# Patient Record
Sex: Female | Born: 1940 | Race: White | Hispanic: No | Marital: Married | Smoking: Former smoker
Health system: Southern US, Community
[De-identification: ages and names within clinical notes are randomized; demographics above are authoritative.]

## PROBLEM LIST (undated history)

## (undated) DIAGNOSIS — D649 Anemia, unspecified: Secondary | ICD-10-CM

## (undated) DIAGNOSIS — K5792 Diverticulitis of intestine, part unspecified, without perforation or abscess without bleeding: Secondary | ICD-10-CM

## (undated) DIAGNOSIS — IMO0001 Reserved for inherently not codable concepts without codable children: Secondary | ICD-10-CM

## (undated) DIAGNOSIS — I6529 Occlusion and stenosis of unspecified carotid artery: Secondary | ICD-10-CM

## (undated) DIAGNOSIS — M1990 Unspecified osteoarthritis, unspecified site: Secondary | ICD-10-CM

## (undated) DIAGNOSIS — I10 Essential (primary) hypertension: Secondary | ICD-10-CM

## (undated) DIAGNOSIS — C189 Malignant neoplasm of colon, unspecified: Secondary | ICD-10-CM

## (undated) DIAGNOSIS — C44721 Squamous cell carcinoma of skin of unspecified lower limb, including hip: Secondary | ICD-10-CM

## (undated) DIAGNOSIS — Z9289 Personal history of other medical treatment: Secondary | ICD-10-CM

## (undated) DIAGNOSIS — K529 Noninfective gastroenteritis and colitis, unspecified: Secondary | ICD-10-CM

## (undated) DIAGNOSIS — J449 Chronic obstructive pulmonary disease, unspecified: Secondary | ICD-10-CM

## (undated) DIAGNOSIS — L930 Discoid lupus erythematosus: Secondary | ICD-10-CM

## (undated) DIAGNOSIS — L4050 Arthropathic psoriasis, unspecified: Secondary | ICD-10-CM

## (undated) HISTORY — DX: Essential (primary) hypertension: I10

## (undated) HISTORY — PX: DILATION AND CURETTAGE OF UTERUS: SHX78

## (undated) HISTORY — DX: Chronic obstructive pulmonary disease, unspecified: J44.9

## (undated) HISTORY — DX: Diverticulitis of intestine, part unspecified, without perforation or abscess without bleeding: K57.92

## (undated) HISTORY — PX: COLONOSCOPY W/ POLYPECTOMY: SHX1380

## (undated) HISTORY — PX: JOINT REPLACEMENT: SHX530

## (undated) HISTORY — PX: SQUAMOUS CELL CARCINOMA EXCISION: SHX2433

## (undated) HISTORY — PX: MOHS SURGERY: SUR867

## (undated) HISTORY — DX: Discoid lupus erythematosus: L93.0

## (undated) HISTORY — DX: Anemia, unspecified: D64.9

---

## 1966-10-21 HISTORY — PX: VAGINAL HYSTERECTOMY: SUR661

## 1999-06-23 ENCOUNTER — Encounter: Payer: Self-pay | Admitting: Family Medicine

## 1999-06-23 ENCOUNTER — Ambulatory Visit (HOSPITAL_COMMUNITY): Admission: RE | Admit: 1999-06-23 | Discharge: 1999-06-23 | Payer: Self-pay | Admitting: Family Medicine

## 2004-10-21 HISTORY — PX: EXCISIONAL HEMORRHOIDECTOMY: SHX1541

## 2004-10-21 HISTORY — PX: CYSTOCELE REPAIR: SHX163

## 2004-10-21 HISTORY — PX: RECTOCELE REPAIR: SHX761

## 2006-08-12 ENCOUNTER — Ambulatory Visit: Payer: Self-pay | Admitting: Internal Medicine

## 2006-08-26 ENCOUNTER — Ambulatory Visit: Payer: Self-pay | Admitting: Internal Medicine

## 2008-09-07 ENCOUNTER — Encounter: Admission: RE | Admit: 2008-09-07 | Discharge: 2008-09-07 | Payer: Self-pay | Admitting: Interventional Cardiology

## 2008-09-12 ENCOUNTER — Inpatient Hospital Stay (HOSPITAL_BASED_OUTPATIENT_CLINIC_OR_DEPARTMENT_OTHER): Admission: RE | Admit: 2008-09-12 | Discharge: 2008-09-12 | Payer: Self-pay | Admitting: Interventional Cardiology

## 2008-09-12 ENCOUNTER — Ambulatory Visit (HOSPITAL_COMMUNITY): Admission: RE | Admit: 2008-09-12 | Discharge: 2008-09-12 | Payer: Self-pay | Admitting: Interventional Cardiology

## 2008-10-07 ENCOUNTER — Ambulatory Visit (HOSPITAL_COMMUNITY): Admission: RE | Admit: 2008-10-07 | Discharge: 2008-10-07 | Payer: Self-pay | Admitting: Internal Medicine

## 2008-10-07 ENCOUNTER — Encounter: Payer: Self-pay | Admitting: Emergency Medicine

## 2008-12-08 ENCOUNTER — Ambulatory Visit: Payer: Self-pay | Admitting: Emergency Medicine

## 2008-12-08 DIAGNOSIS — J449 Chronic obstructive pulmonary disease, unspecified: Secondary | ICD-10-CM | POA: Insufficient documentation

## 2008-12-08 DIAGNOSIS — I10 Essential (primary) hypertension: Secondary | ICD-10-CM | POA: Insufficient documentation

## 2009-03-28 ENCOUNTER — Ambulatory Visit: Payer: Self-pay | Admitting: *Deleted

## 2009-04-13 ENCOUNTER — Ambulatory Visit: Payer: Self-pay | Admitting: *Deleted

## 2009-06-19 ENCOUNTER — Ambulatory Visit: Payer: Self-pay | Admitting: Emergency Medicine

## 2009-11-21 ENCOUNTER — Ambulatory Visit: Payer: Self-pay | Admitting: Emergency Medicine

## 2009-11-21 DIAGNOSIS — L930 Discoid lupus erythematosus: Secondary | ICD-10-CM

## 2010-01-22 ENCOUNTER — Telehealth (INDEPENDENT_AMBULATORY_CARE_PROVIDER_SITE_OTHER): Payer: Self-pay | Admitting: *Deleted

## 2010-01-24 ENCOUNTER — Telehealth: Payer: Self-pay | Admitting: Emergency Medicine

## 2010-11-22 NOTE — Progress Notes (Signed)
Summary: wants meds called in  Phone Note Call from Patient Call back at Home Phone [phone]   Caller: Patient Call For: byrum Summary of Call: patient has cough, drainage, sore throat, and loosing voice. wants dr byrum to call something in for her. she thinks it is her allergies Initial call taken by: Valinda Hoar,  January 22, 2010 12:53 PM  Follow-up for Phone Call        called and spoke with pt.  pt states she recently saw RB 11-21-2009 and was told to f/u in 1 yr.   Pt calls today c/o coughing up yellow to green colored sputum, nasal congestion with yellow to green colored nasal drainage, head ache, sinus pressure, losing voice.  Pt denied sore throat or fever.  Symptoms started yesterday.  Pt has taken Tylenol and Benadryl with no relief of symptoms.  Please advise.  RB not in office today.  Will forward message to doc of the day to address.  Thank you.  Aundra Millet Reynolds LPN  January 23, 980 1:54 PM   Additional Follow-up for Phone Call Additional follow up Details #1::        call in augmentin 875  one by mouth two times a day x 7days nasonex two sprays each nostril daily #1 Needs to see Byrum soon Additional Follow-up by: Storm Frisk MD,  January 22, 2010 2:34 PM    Additional Follow-up for Phone Call Additional follow up Details #2::    called and spoke with pt.  pt aware rx sent to pharmacy and to f/u with RB.  Aundra Millet Reynolds LPN  January 22, 1913 3:03 PM   New/Updated Medications: AUGMENTIN 875-125 MG TABS (AMOXICILLIN-POT CLAVULANATE) Take 1 tablet by mouth two times a day for 7 days NASONEX 50 MCG/ACT  SUSP (MOMETASONE FUROATE) Two puffs each nostril daily Prescriptions: NASONEX 50 MCG/ACT  SUSP (MOMETASONE FUROATE) Two puffs each nostril daily  #1 x 0   Entered by:   Arman Filter LPN   Authorized by:   Storm Frisk MD   Signed by:   Arman Filter LPN on 78/29/5621   Method used:   Electronically to        [facility] [phone]* (retail)  [address]       [place], [place]  [address]       Ph: [phone] or [phone]       Fax: [phone]   RxID:   [id number] AUGMENTIN 875-125 MG TABS (AMOXICILLIN-POT CLAVULANATE) Take 1 tablet by mouth two times a day for 7 days  #14 x 0   Entered by:   Arman Filter LPN   Authorized by:   Storm Frisk MD   Signed by:   Arman Filter LPN on 88/41/6606   Method used:   Electronically to        [facility] [phone]* (retail)       [address]       [place], [place]  [address]       Ph: [phone] or [phone]       Fax: [phone]   RxID:   [id number]

## 2010-11-22 NOTE — Progress Notes (Signed)
Summary: cough/ diarrhea  Phone Note Call from Patient   Caller: Patient Call For: byrum Summary of Call: pt still coughing and have diarrhea . taking nasonex and antibiotic [facility] Initial call taken by: Rickard Patience,  January 24, 2010 9:11 AM  Follow-up for Phone Call        Pt states harseness is better, but she does have a dry cough. I advised the medication will take time to completely get her better. She then states she has had diarrhea since she started the amoxicillin. She states seh is eating before taking the medication. Please advise. Carron Curie CMA  January 24, 2010 10:20 AM  Please ask her to stop the Augmentin. She may not need any further abx. If her symptoms worsen than we can call her in an alternative - have her call us on Friday to update [place] on her status. Leslye Peer MD  January 24, 2010 10:51 AM   Follow-up by: Leslye Peer MD,  January 24, 2010 10:51 AM  Additional Follow-up for Phone Call Additional follow up Details #1::        pt advised. Carron Curie CMA  January 24, 2010 10:55 AM

## 2010-11-22 NOTE — Assessment & Plan Note (Signed)
Summary: COPD   Visit Type:  Follow-up Copy to:  Dr. Timothy Lasso Primary Provider/Referring Provider:  Dr Timothy Lasso  CC:  COPD.  The patient has no complaints today.Marland Kitchen  History of Present Illness: 70 yo woman with HTN, former tobacco. Dx with severe AFL and severe COPD.   ROV 11/21/09 -- She is taking Advair qam instead of two times a day. Continues to be active, walks on treadmill once daily. Able to sing in the choir. No exacerbations since last visit, does not wheeze. Seems to be tolerating the Advair without much cough. 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 Seems to be tolerating.         Current Medications (verified): 1)  Abc Plus Senior  Tabs (Multiple Vitamins-Minerals) .... Once Daily 2)  Vitamins C E 500-400 Mg-Unit Caps (Vitamins C E) .... Once Daily 3)  Adult Aspirin Ec Low Strength 81 Mg Tbec (Aspirin) .... Once Daily 4)  Oscal 500/200 D-3 500-200 Mg-Unit Tabs (Calcium-Vitamin D) .... Once Daily 5)  Diovan 80 Mg Tabs (Valsartan) .... Once Daily 6)  Premarin 1.25 Mg Tabs (Estrogens Conjugated) .... Once Daily 7)  Caltrate 600 1500 Mg Tabs (Calcium Carbonate) .... Once Daily 8)  B-12 Dots 500 Mcg Subl (Cyanocobalamin) .... Once Daily 9)  Cvs Lecithin 1200 Mg Caps (Lecithin) .... Three Times A Day 10)  Advair Diskus 250-50 Mcg/dose Misc (Fluticasone-Salmeterol) .Marland Kitchen.. 1 Puff Once Daily 11)  Plaquenil 200 Mg Tabs (Hydroxychloroquine Sulfate) .Marland Kitchen.. 1 By Mouth Daily  Allergies (verified): 1)  ! * Invair 2)  ! 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) HYPERTENSION (ICD-401.9) C O P D (ICD-496)  Vital Signs:  Patient profile:   70 year old female Height:      65 inches (165.10 cm) Weight:      167.50 pounds (76.14 kg) BMI:     27.97 O2 Sat:      91 % on Room air Temp:     97.7 degrees F (36.50 degrees C) oral Pulse rate:   87 / minute BP sitting:   118 / 80  (left  arm) Cuff size:   regular  Vitals Entered By: Michel Bickers CMA (November 21, 2009 2:32 PM)  O2 Sat at Rest %:  91 O2 Flow:  Room air  Physical Exam  General:  well developed, well nourished, in no acute distress Head:  normocephalic and atraumatic Eyes:  conjunctiva and sclera clear Nose:  no deformity, discharge, inflammation, or lesions Mouth:  no deformity or lesions Neck:  no masses, thyromegaly, or abnormal cervical nodes Chest Wall:  no deformities noted Lungs:  clear bilaterally to auscultation and percussion Heart:  regular rate and rhythm, S1, S2 without murmurs, rubs, gallops, or clicks Abdomen:  not examined Msk:  no deformity or scoliosis noted with normal posture Extremities:  no clubbing, cyanosis, edema, or deformity noted Neurologic:  non-focal Skin:  intact without lesions or rashes Psych:  alert and cooperative; normal mood and affect; normal attention span and concentration   Impression & Recommendations:  Problem # 1:  C O P D (ICD-496) Advair once daily  ProAir as needed  pneumovax x 1 more time flu shot every year rov 1 yr or as needed   Medications Added to Medication List This Visit: 1)  Advair Diskus 250-50 Mcg/dose Misc (Fluticasone-salmeterol) .Marland Kitchen.. 1 puff once daily 2)  Plaquenil 200 Mg Tabs (Hydroxychloroquine sulfate) .Marland Kitchen.. 1 by  mouth daily 3)  Proair Hfa 108 (90 Base) Mcg/act Aers (Albuterol sulfate) .... 2 puffs q4h as needed sob  Other Orders: Est. Patient Level III (70623)  Patient Instructions: 1)  Continue your Advair every morning 2)  Use ProAir as needed  3)  Get your pneumona vaccine as planned with Dr Timothy Lasso. You need it one more time before age 29.  4)  Follow up with Dr Delton Coombes in 1 year or as needed    Immunization History:  Influenza Immunization History:    Influenza:  historical (07/05/2009)  Pneumovax Immunization History:    Pneumovax:  historical (04/19/2005)

## 2010-12-06 ENCOUNTER — Encounter: Payer: Self-pay | Admitting: Emergency Medicine

## 2010-12-06 ENCOUNTER — Encounter (INDEPENDENT_AMBULATORY_CARE_PROVIDER_SITE_OTHER): Payer: Medicare Other | Admitting: Emergency Medicine

## 2010-12-06 DIAGNOSIS — J449 Chronic obstructive pulmonary disease, unspecified: Secondary | ICD-10-CM

## 2010-12-12 NOTE — Assessment & Plan Note (Signed)
Summary: COPD   Visit Type:  Follow-up Copy to:  Dr. Timothy Lasso Primary Provider/Referring Provider:  Dr Timothy Lasso  CC:  1 year follow up. Marland Kitchen  History of Present Illness: 70 yo woman with HTN, former tobacco. Dx with severe AFL and severe COPD.   ROV 11/21/09 -- She is taking Advair qam instead of two times a day. Continues to be active, walks on treadmill once daily. Able to sing in the choir. No exacerbations since last visit, does not wheeze. Seems to be tolerating the Advair without much cough. 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 Seems to be tolerating.   ROV 12/06/10 -- f/u for severe COPD, also w hx of discoid SLE. annual f/u. Was treated for URI and exacerbation with steroids. No other flares in last year. Still able to exert, able to sing in the choir. Still has a bit of a raspy voice. Using Advair in the am only (keeps her from sleeping at night). Never uses her ProAir.         Allergies: 1)  ! * Invair 2)  ! Compazine  Vital Signs:  Patient profile:   70 year old female Height:      65 inches Weight:      170.25 pounds BMI:     28.43 O2 Sat:      98 % on Room air Temp:     97.9 degrees F oral Pulse rate:   71 / minute BP supine:   130 / 68 Cuff size:   regular  Vitals Entered By: Carver Fila (December 06, 2010 10:06 AM)  O2 Flow:  Room air CC: 1 year follow up.  Comments meds and allergies updated Phone number updated  Mindy Edward Jolly  December 06, 2010 10:07 AM    Physical Exam  General:  well developed, well nourished, in no acute distress Head:  normocephalic and atraumatic Eyes:  conjunctiva and sclera clear Nose:  no deformity, discharge, inflammation, or lesions Mouth:  no deformity or lesions Neck:  no masses, thyromegaly, or abnormal cervical nodes Chest Wall:  no deformities noted Lungs:  clear bilaterally to auscultation and percussion Heart:  regular rate and rhythm, S1, S2 without murmurs, rubs, gallops, or  clicks Abdomen:  not examined Msk:  no deformity or scoliosis noted with normal posture Extremities:  no clubbing, cyanosis, edema, or deformity noted Neurologic:  non-focal Skin:  intact without lesions or rashes Psych:  alert and cooperative; normal mood and affect; normal attention span and concentration   Impression & Recommendations:  Problem # 1:  C O P D (ICD-496) Stable  Other Orders: Est. Patient Level III (16109)  Patient Instructions: 1)  Continue your Advair as you are taking it 2)  Have ProAir available to use as needed  3)  Follow with Dr Delton Coombes in 1 year or as needed  4)  Get a flu shot every fall. 5)  You will not need another Pneumovax.    Influenza Immunization History:    Influenza # 1:  Historical (12020-04-2610)  Pneumovax Immunization History:    Pneumovax # 1:  Historical (12020-04-2610)

## 2011-01-09 NOTE — Progress Notes (Signed)
This encounter was created in error - please disregard.

## 2011-03-05 NOTE — Cardiovascular Report (Signed)
NAME:  Casey Wilkinson, Casey Wilkinson NO.:  [id number]   MEDICAL RECORD NO.:  [id number]          PATIENT TYPE:  OIB   LOCATION:  1963                         FACILITY:  [facility]   PHYSICIAN:  Lyn Records, M.D.   DATE OF BIRTH:  03-03-1941   DATE OF PROCEDURE:  09/12/2008  DATE OF DISCHARGE:  09/12/2008                            CARDIAC CATHETERIZATION   INDICATION:  [name] is 45, and was evaluated with a stress nuclear  study ordered by Dr. Lupe Carney on September 07, 2008.  The stress  test was limited by the development of shortness of breath and chest  tightness.  This symptom had been ongoing for about a month prior to the  study.  She developed ST-segment depression with inferolateral  persistent depression and T-wave inversion post exercise.  She required  sublingual nitroglycerin after the procedure for resolution of chest  discomfort.  Surprisingly, the nuclear images were normal.  Since  discharge from the office on the day of the study, she has had one  episode of chest tightness that was relieved by sublingual nitro.   PROCEDURES PERFORMED:  1. Left heart cath.  2. Selective coronary angiography.  3. Left ventriculography.   DESCRIPTION:  After informed consent and following 1 mg of IV Versed and  25 mcg of fentanyl, a 4-French sheath was placed in the right femoral  artery using the modified Seldinger technique.  A 4-French A2  multipurpose catheter was used for hemodynamic recordings, left  ventriculography by hand injection, and selective right coronary  angiography.  A 4-French #4 left Judkins catheter was used for left  coronary angiography.  The patient tolerated the procedure without  complications.   RESULTS:  1. Hemodynamic data:      a.     Aortic pressure 129/16.      b.     Left ventricular pressure 133/17.  2. Left ventriculography:  The left ventricular cavity size and      function are normal.  3. Coronary angiography:      a.     Left  main coronary:  Widely patent.      b.     Left anterior descending coronary:  The vessel is large.  It       is transapical.  It gives origin to a probable intramyocardial LAD       segment that supplies all of the septal perforators.  The LAD       reaches the LV apex.  No significant obstruction is noted.  A       large first diagonal wraps around the anterolateral wall and       supplies a region near the apex.  No obstruction is noted.  The       proximal LAD contains mild calcification.      c.     Circumflex artery:  The circumflex coronary artery is large       and gives origin to 3 obtuse marginal branches.  The second obtuse       marginal is small.  The third obtuse marginal is moderate  in size       and supplies in inferolateral wall.  The first obtuse marginal is       a large branching vessel that has near the distribution of a ramus       intermedius branch.      d.     Right coronary:  The right coronary artery is calcified.  It       contains a shepherd's crook.  Irregularities are noted in the       proximal vessel and in the shepherd's crook.  The vessel is widely       patent without any evidence of obstruction.  A large PDA arises       distally.   CONCLUSIONS:  1. Mild calcification in the proximal right coronary artery and      proximal left anterior descending.  2. Widely patent coronaries without visible significant obstruction or      plaque.  3. Normal left ventricular function.   PLAN:  The patient's exertional dyspnea and EKG abnormalities are still  concerning.  Pulmonary embolism needs to be excluded.      Lyn Records, M.D.  Electronically Signed     HWS/MEDQ  D:  09/12/2008  T:  09/12/2008  Job:  [id number]   cc:   L. Lupe Carney, M.D.

## 2011-03-05 NOTE — Consult Note (Signed)
VASCULAR SURGERY CONSULTATION   VERNIA, TEEM  DOB:  [birth year]                                       04/13/2009  ZOXWR#:[id number]   REFERRING PHYSICIAN:  Richard C. Tuchman, D.P.M.   PRIMARY CARE PHYSICIAN:  Gwen Pounds, MD.   REFERRAL DIAGNOSIS:  Peripheral vascular disease.   HISTORY:  The patient is a 70 year old female generally well referred  for evaluation of peripheral vascular disease.  She was seen by Dr.  Leeanne Deed with a complaint of an ingrown toenail.  His evaluation,  however, revealed a reduction in lower extremity pulses and she was  referred for lower extremity Dopplers.   Lower extremity Dopplers do reveal mild reduction in ankle brachial  indices at 0.89 on the right and 0.95 on the left.  Digital indices were  0.48 on the right and 0.55 on the left.  Biphasic tibial waveforms are  present bilaterally.   The patient has minimal risk factors for peripheral vascular disease,  she is not a diabetic.  She has used tobacco in the past although quit 8  years ago.   She has mild calf discomfort in the left leg with ambulation.   PAST MEDICAL HISTORY:  1. Hypertension.  2. COPD.   SOCIAL HISTORY:  The patient is married with two children.  She is  retired.  She discontinued tobacco use 8 years ago.   FAMILY HISTORY:  Noncontributory.   REVIEW OF SYSTEMS:  Refer to patient encounter form.  This is  unremarkable.   MEDICATIONS:  1. Diovan HCT 160/12.5 daily.  2. Premarin 0.0625 daily.  3. Advair 250/50 Diskus b.i.d.  4. Lecithin 1200 mg t.i.d.  5. Centrum Silver 1 tablet daily.  6. Caltrate/calcium 2 tablets daily.  7. B12 215 mcg daily.   ALLERGIES:  Innovar and Compazine.   PHYSICAL EXAM:  General:  A well-appearing 70 year old female.  Alert  and oriented.  No distress.  Vital signs:  BP 129/68, pulse 62 per  minute.  Lower extremities:  2+ femoral and popliteal pulses  bilaterally.  1+ dorsalis pedis pulses.  Absent  posterior tibial pulse.  Feet are well-perfused, warm.  No ischemic skin changes noted.  Bilateral moderate varicosities noted with spiders.  No ulceration.   IMPRESSION:  1. Mild peripheral vascular disease with minimal reduction in ankle      brachial index and moderate reduction in toe brachial index.  2. Hypertension.  3. Chronic obstructive pulmonary disease.   RECOMMENDATIONS:  The patient shows evidence of mild peripheral vascular  disease and feet are well-perfused.  I do feel confident that she can  undergo treatment for ingrown toenail if required.   Balinda Quails, M.D.  Electronically Signed  PGH/MEDQ  D:  04/13/2009  T:  04/14/2009  Job:  [id number]   cc:   Gwen Pounds, MD  Fanny Bien. Tuchman, D.P.M.

## 2011-07-24 LAB — D-DIMER, QUANTITATIVE: D-Dimer, Quant: 0.42

## 2011-11-12 DIAGNOSIS — C4492 Squamous cell carcinoma of skin, unspecified: Secondary | ICD-10-CM | POA: Diagnosis not present

## 2011-12-04 DIAGNOSIS — L570 Actinic keratosis: Secondary | ICD-10-CM | POA: Diagnosis not present

## 2011-12-04 DIAGNOSIS — Z79899 Other long term (current) drug therapy: Secondary | ICD-10-CM | POA: Diagnosis not present

## 2011-12-04 DIAGNOSIS — L408 Other psoriasis: Secondary | ICD-10-CM | POA: Diagnosis not present

## 2011-12-05 DIAGNOSIS — H2510 Age-related nuclear cataract, unspecified eye: Secondary | ICD-10-CM | POA: Diagnosis not present

## 2011-12-16 ENCOUNTER — Ambulatory Visit: Payer: Medicare Other | Admitting: Critical Care Medicine

## 2011-12-23 DIAGNOSIS — L408 Other psoriasis: Secondary | ICD-10-CM | POA: Diagnosis not present

## 2011-12-23 DIAGNOSIS — Z79899 Other long term (current) drug therapy: Secondary | ICD-10-CM | POA: Diagnosis not present

## 2011-12-26 DIAGNOSIS — H2510 Age-related nuclear cataract, unspecified eye: Secondary | ICD-10-CM | POA: Diagnosis not present

## 2012-01-02 ENCOUNTER — Encounter: Payer: Self-pay | Admitting: Emergency Medicine

## 2012-01-03 ENCOUNTER — Ambulatory Visit (INDEPENDENT_AMBULATORY_CARE_PROVIDER_SITE_OTHER): Payer: Medicare Other | Admitting: Emergency Medicine

## 2012-01-03 ENCOUNTER — Encounter: Payer: Self-pay | Admitting: Emergency Medicine

## 2012-01-03 VITALS — BP 138/62 | HR 78 | Temp 98.0°F | Ht 65.0 in | Wt 181.6 lb

## 2012-01-03 DIAGNOSIS — L930 Discoid lupus erythematosus: Secondary | ICD-10-CM | POA: Diagnosis not present

## 2012-01-03 DIAGNOSIS — J449 Chronic obstructive pulmonary disease, unspecified: Secondary | ICD-10-CM

## 2012-01-03 NOTE — Patient Instructions (Signed)
Please continue Advair daily We will follow up in 6 months with a CXR

## 2012-01-03 NOTE — Assessment & Plan Note (Signed)
Continue Advair qd for now, consider changing to Spiriva in future. CXR next time since she is now on MTX (Dr Karlyn Agee).

## 2012-01-03 NOTE — Progress Notes (Signed)
71 yo woman with HTN, former tobacco. Dx with severe AFL and severe COPD.   ROV 11/21/09 -- She is taking Advair qam instead of two times a day. Continues to be active, walks on treadmill once daily. Able to sing in the choir. No exacerbations since last visit, does not wheeze. Seems to be tolerating the Advair without much cough. 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 Seems to be tolerating.   ROV 12/06/10 -- f/u for severe COPD, also w hx of discoid SLE. annual f/u. Was treated for URI and exacerbation with steroids. No other flares in last year. Still able to exert, able to sing in the choir. Still has a bit of a raspy voice. Using Advair in the am only (keeps her from sleeping at night). Never uses her ProAir.   ROV 01/03/12 -- f/u for severe COPD, also w hx of discoid SLE and psoriasis now on MTX in an effort to get her off plaquanil. Regular follow up, says her breathing is doing ok, still sings in the choir. Still gets exertional SOB. Has a raspy voice at all times. Still on Advair qd, rarely needs ProAir.    Filed Vitals:   01/03/12 1532  BP: 138/62  Pulse: 78  Temp: 98 F (36.7 C)    Gen: Pleasant, well-nourished, in no distress,  normal affect  ENT: No lesions,  mouth clear,  oropharynx clear, no postnasal drip  Neck: No JVD, no TMG, no carotid bruits  Lungs: No use of accessory muscles, no dullness to percussion, clear without rales or rhonchi  Cardiovascular: RRR, heart sounds normal, no murmur or gallops, no peripheral edema  Musculoskeletal: No deformities, no cyanosis or clubbing  Neuro: alert, non focal  Skin: Warm, no lesions or rashes  LUPUS ERYTHEMATOSUS, DISCOID Now on MTX  C O P D Continue Advair qd for now, consider changing to Spiriva in future. CXR next time since she is now on MTX (Dr Karlyn Agee).

## 2012-01-03 NOTE — Assessment & Plan Note (Signed)
Now on MTX

## 2012-01-26 DIAGNOSIS — L408 Other psoriasis: Secondary | ICD-10-CM | POA: Diagnosis not present

## 2012-01-26 DIAGNOSIS — Z79899 Other long term (current) drug therapy: Secondary | ICD-10-CM | POA: Diagnosis not present

## 2012-01-27 DIAGNOSIS — L408 Other psoriasis: Secondary | ICD-10-CM | POA: Diagnosis not present

## 2012-01-27 DIAGNOSIS — L570 Actinic keratosis: Secondary | ICD-10-CM | POA: Diagnosis not present

## 2012-02-27 DIAGNOSIS — Z79899 Other long term (current) drug therapy: Secondary | ICD-10-CM | POA: Diagnosis not present

## 2012-02-27 DIAGNOSIS — L408 Other psoriasis: Secondary | ICD-10-CM | POA: Diagnosis not present

## 2012-03-31 DIAGNOSIS — L930 Discoid lupus erythematosus: Secondary | ICD-10-CM | POA: Diagnosis not present

## 2012-03-31 DIAGNOSIS — I10 Essential (primary) hypertension: Secondary | ICD-10-CM | POA: Diagnosis not present

## 2012-03-31 DIAGNOSIS — D649 Anemia, unspecified: Secondary | ICD-10-CM | POA: Diagnosis not present

## 2012-03-31 DIAGNOSIS — J449 Chronic obstructive pulmonary disease, unspecified: Secondary | ICD-10-CM | POA: Diagnosis not present

## 2012-04-30 DIAGNOSIS — L930 Discoid lupus erythematosus: Secondary | ICD-10-CM | POA: Diagnosis not present

## 2012-04-30 DIAGNOSIS — L408 Other psoriasis: Secondary | ICD-10-CM | POA: Diagnosis not present

## 2012-05-19 DIAGNOSIS — S99919A Unspecified injury of unspecified ankle, initial encounter: Secondary | ICD-10-CM | POA: Diagnosis not present

## 2012-05-19 DIAGNOSIS — S8990XA Unspecified injury of unspecified lower leg, initial encounter: Secondary | ICD-10-CM | POA: Diagnosis not present

## 2012-05-25 DIAGNOSIS — S93409A Sprain of unspecified ligament of unspecified ankle, initial encounter: Secondary | ICD-10-CM | POA: Diagnosis not present

## 2012-06-08 DIAGNOSIS — S93409A Sprain of unspecified ligament of unspecified ankle, initial encounter: Secondary | ICD-10-CM | POA: Diagnosis not present

## 2012-06-29 DIAGNOSIS — Z1231 Encounter for screening mammogram for malignant neoplasm of breast: Secondary | ICD-10-CM | POA: Diagnosis not present

## 2012-07-02 ENCOUNTER — Ambulatory Visit (INDEPENDENT_AMBULATORY_CARE_PROVIDER_SITE_OTHER)
Admission: RE | Admit: 2012-07-02 | Discharge: 2012-07-02 | Disposition: A | Discharge status: Home / Self Care | Payer: Medicare Other | Source: Ambulatory Visit | Attending: Emergency Medicine | Admitting: Emergency Medicine

## 2012-07-02 ENCOUNTER — Ambulatory Visit (INDEPENDENT_AMBULATORY_CARE_PROVIDER_SITE_OTHER): Payer: Medicare Other | Admitting: Emergency Medicine

## 2012-07-02 ENCOUNTER — Encounter: Payer: Self-pay | Admitting: Emergency Medicine

## 2012-07-02 VITALS — BP 108/56 | HR 81 | Temp 98.0°F | Ht 65.0 in | Wt 173.0 lb

## 2012-07-02 DIAGNOSIS — J40 Bronchitis, not specified as acute or chronic: Secondary | ICD-10-CM | POA: Diagnosis not present

## 2012-07-02 DIAGNOSIS — J449 Chronic obstructive pulmonary disease, unspecified: Secondary | ICD-10-CM | POA: Diagnosis not present

## 2012-07-02 MED ORDER — TIOTROPIUM BROMIDE MONOHYDRATE 18 MCG IN CAPS: 18.0000 ug | ORAL_CAPSULE | Freq: Every day | RESPIRATORY_TRACT | Status: DC

## 2012-07-02 NOTE — Progress Notes (Signed)
71 yo woman with HTN, former tobacco. Dx with severe AFL and severe COPD.   ROV 11/21/09 -- She is taking Advair qam instead of two times a day. Continues to be active, walks on treadmill once daily. Able to sing in the choir. No exacerbations since last visit, does not wheeze. Seems to be tolerating the Advair without much cough. 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 Seems to be tolerating.   ROV 12/06/10 -- f/u for severe COPD, also w hx of discoid SLE. annual f/u. Was treated for URI and exacerbation with steroids. No other flares in last year. Still able to exert, able to sing in the choir. Still has a bit of a raspy voice. Using Advair in the am only (keeps her from sleeping at night). Never uses her ProAir.   ROV 01/03/12 -- f/u for severe COPD, also w hx of discoid SLE and psoriasis now on MTX in an effort to get her off plaquanil. Regular follow up, says her breathing is doing ok, still sings in the choir. Still gets exertional SOB. Has a raspy voice at all times. Still on Advair qd, rarely needs ProAir.   ROV 07/02/12 -- f/u for severe COPD, also w hx of discoid SLE and psoriasis now on MTX and plaquanil.   Has been taking advair qd. Had CXR today (on MTX) >> No change compared with 11/09.    Filed Vitals:   07/02/12 1449  BP: 108/56  Pulse: 81  Temp: 98 F (36.7 C)    Gen: Pleasant, well-nourished, in no distress,  normal affect  ENT: No lesions,  mouth clear,  oropharynx clear, no postnasal drip  Neck: No JVD, no TMG, no carotid bruits  Lungs: No use of accessory muscles, no dullness to percussion, clear without rales or rhonchi  Cardiovascular: RRR, heart sounds normal, no murmur or gallops, no peripheral edema  Musculoskeletal: No deformities, no cyanosis or clubbing  Neuro: alert, non focal  Skin: Warm, no lesions or rashes  LUPUS ERYTHEMATOSUS, DISCOID No evidence evolving ILD on MTX - follow CXR anually  C O P D She is  happy on the Advair, isn't sure she wants to change to Spiriva. She is willing to take sample for several weeks to see if making a switch would be worth it. If she wants to change then she will call us.

## 2012-07-02 NOTE — Assessment & Plan Note (Signed)
No evidence evolving ILD on MTX - follow CXR anually

## 2012-07-02 NOTE — Assessment & Plan Note (Signed)
She is happy on the Advair, isn't sure she wants to change to Spiriva. She is willing to take sample for several weeks to see if making a switch would be worth it. If she wants to change then she will call us.

## 2012-07-02 NOTE — Patient Instructions (Addendum)
Start Spiriva 1 inhalation daily for three weeks. If your breathing benefits then call us and we will change your Advair to Spiriva.  We will repeat your CXR once a year.  Follow with Dr Delton Coombes in 1 year or sooner if you have any problems.

## 2012-07-24 DIAGNOSIS — Z23 Encounter for immunization: Secondary | ICD-10-CM | POA: Diagnosis not present

## 2012-08-06 DIAGNOSIS — L930 Discoid lupus erythematosus: Secondary | ICD-10-CM | POA: Diagnosis not present

## 2012-08-06 DIAGNOSIS — L570 Actinic keratosis: Secondary | ICD-10-CM | POA: Diagnosis not present

## 2012-08-06 DIAGNOSIS — Z85828 Personal history of other malignant neoplasm of skin: Secondary | ICD-10-CM | POA: Diagnosis not present

## 2012-08-06 DIAGNOSIS — L408 Other psoriasis: Secondary | ICD-10-CM | POA: Diagnosis not present

## 2012-08-06 DIAGNOSIS — C44721 Squamous cell carcinoma of skin of unspecified lower limb, including hip: Secondary | ICD-10-CM | POA: Diagnosis not present

## 2012-08-06 DIAGNOSIS — Z79899 Other long term (current) drug therapy: Secondary | ICD-10-CM | POA: Diagnosis not present

## 2012-09-25 DIAGNOSIS — M899 Disorder of bone, unspecified: Secondary | ICD-10-CM | POA: Diagnosis not present

## 2012-09-25 DIAGNOSIS — I739 Peripheral vascular disease, unspecified: Secondary | ICD-10-CM | POA: Diagnosis not present

## 2012-09-25 DIAGNOSIS — I10 Essential (primary) hypertension: Secondary | ICD-10-CM | POA: Diagnosis not present

## 2012-10-02 DIAGNOSIS — J449 Chronic obstructive pulmonary disease, unspecified: Secondary | ICD-10-CM | POA: Diagnosis not present

## 2012-10-02 DIAGNOSIS — L930 Discoid lupus erythematosus: Secondary | ICD-10-CM | POA: Diagnosis not present

## 2012-10-02 DIAGNOSIS — I10 Essential (primary) hypertension: Secondary | ICD-10-CM | POA: Diagnosis not present

## 2012-10-02 DIAGNOSIS — Z0000 Encounter for general adult medical examination without abnormal findings: Secondary | ICD-10-CM | POA: Diagnosis not present

## 2012-10-02 DIAGNOSIS — Z1212 Encounter for screening for malignant neoplasm of rectum: Secondary | ICD-10-CM | POA: Diagnosis not present

## 2012-11-10 DIAGNOSIS — Z85828 Personal history of other malignant neoplasm of skin: Secondary | ICD-10-CM | POA: Diagnosis not present

## 2012-11-10 DIAGNOSIS — L930 Discoid lupus erythematosus: Secondary | ICD-10-CM | POA: Diagnosis not present

## 2012-11-10 DIAGNOSIS — D0470 Carcinoma in situ of skin of unspecified lower limb, including hip: Secondary | ICD-10-CM | POA: Diagnosis not present

## 2012-11-10 DIAGNOSIS — Z79899 Other long term (current) drug therapy: Secondary | ICD-10-CM | POA: Diagnosis not present

## 2012-11-10 DIAGNOSIS — L408 Other psoriasis: Secondary | ICD-10-CM | POA: Diagnosis not present

## 2012-11-10 DIAGNOSIS — D485 Neoplasm of uncertain behavior of skin: Secondary | ICD-10-CM | POA: Diagnosis not present

## 2012-11-10 DIAGNOSIS — L570 Actinic keratosis: Secondary | ICD-10-CM | POA: Diagnosis not present

## 2012-11-23 DIAGNOSIS — N644 Mastodynia: Secondary | ICD-10-CM | POA: Diagnosis not present

## 2012-11-23 DIAGNOSIS — N63 Unspecified lump in unspecified breast: Secondary | ICD-10-CM | POA: Diagnosis not present

## 2012-11-23 DIAGNOSIS — I10 Essential (primary) hypertension: Secondary | ICD-10-CM | POA: Diagnosis not present

## 2012-11-23 DIAGNOSIS — N6009 Solitary cyst of unspecified breast: Secondary | ICD-10-CM | POA: Diagnosis not present

## 2012-12-21 DIAGNOSIS — J449 Chronic obstructive pulmonary disease, unspecified: Secondary | ICD-10-CM | POA: Diagnosis not present

## 2013-01-23 DIAGNOSIS — S81009A Unspecified open wound, unspecified knee, initial encounter: Secondary | ICD-10-CM | POA: Diagnosis not present

## 2013-02-17 DIAGNOSIS — C44721 Squamous cell carcinoma of skin of unspecified lower limb, including hip: Secondary | ICD-10-CM | POA: Diagnosis not present

## 2013-02-17 DIAGNOSIS — Z85828 Personal history of other malignant neoplasm of skin: Secondary | ICD-10-CM | POA: Diagnosis not present

## 2013-02-17 DIAGNOSIS — Z79899 Other long term (current) drug therapy: Secondary | ICD-10-CM | POA: Diagnosis not present

## 2013-02-17 DIAGNOSIS — D485 Neoplasm of uncertain behavior of skin: Secondary | ICD-10-CM | POA: Diagnosis not present

## 2013-02-17 DIAGNOSIS — L570 Actinic keratosis: Secondary | ICD-10-CM | POA: Diagnosis not present

## 2013-02-17 DIAGNOSIS — L408 Other psoriasis: Secondary | ICD-10-CM | POA: Diagnosis not present

## 2013-02-17 DIAGNOSIS — L930 Discoid lupus erythematosus: Secondary | ICD-10-CM | POA: Diagnosis not present

## 2013-04-06 DIAGNOSIS — Z1331 Encounter for screening for depression: Secondary | ICD-10-CM | POA: Diagnosis not present

## 2013-04-06 DIAGNOSIS — Z780 Asymptomatic menopausal state: Secondary | ICD-10-CM | POA: Diagnosis not present

## 2013-04-06 DIAGNOSIS — I10 Essential (primary) hypertension: Secondary | ICD-10-CM | POA: Diagnosis not present

## 2013-04-06 DIAGNOSIS — M899 Disorder of bone, unspecified: Secondary | ICD-10-CM | POA: Diagnosis not present

## 2013-04-06 DIAGNOSIS — M545 Low back pain: Secondary | ICD-10-CM | POA: Diagnosis not present

## 2013-04-06 DIAGNOSIS — L930 Discoid lupus erythematosus: Secondary | ICD-10-CM | POA: Diagnosis not present

## 2013-04-06 DIAGNOSIS — I868 Varicose veins of other specified sites: Secondary | ICD-10-CM | POA: Diagnosis not present

## 2013-04-06 DIAGNOSIS — M949 Disorder of cartilage, unspecified: Secondary | ICD-10-CM | POA: Diagnosis not present

## 2013-04-06 DIAGNOSIS — D649 Anemia, unspecified: Secondary | ICD-10-CM | POA: Diagnosis not present

## 2013-04-06 DIAGNOSIS — J449 Chronic obstructive pulmonary disease, unspecified: Secondary | ICD-10-CM | POA: Diagnosis not present

## 2013-04-06 DIAGNOSIS — N951 Menopausal and female climacteric states: Secondary | ICD-10-CM | POA: Diagnosis not present

## 2013-05-26 DIAGNOSIS — Z85828 Personal history of other malignant neoplasm of skin: Secondary | ICD-10-CM | POA: Diagnosis not present

## 2013-05-26 DIAGNOSIS — L930 Discoid lupus erythematosus: Secondary | ICD-10-CM | POA: Diagnosis not present

## 2013-05-26 DIAGNOSIS — L408 Other psoriasis: Secondary | ICD-10-CM | POA: Diagnosis not present

## 2013-05-26 DIAGNOSIS — Z79899 Other long term (current) drug therapy: Secondary | ICD-10-CM | POA: Diagnosis not present

## 2013-05-26 DIAGNOSIS — L570 Actinic keratosis: Secondary | ICD-10-CM | POA: Diagnosis not present

## 2013-06-14 DIAGNOSIS — K644 Residual hemorrhoidal skin tags: Secondary | ICD-10-CM | POA: Diagnosis not present

## 2013-06-14 DIAGNOSIS — Z6827 Body mass index (BMI) 27.0-27.9, adult: Secondary | ICD-10-CM | POA: Diagnosis not present

## 2013-06-30 DIAGNOSIS — Z09 Encounter for follow-up examination after completed treatment for conditions other than malignant neoplasm: Secondary | ICD-10-CM | POA: Diagnosis not present

## 2013-06-30 DIAGNOSIS — N6459 Other signs and symptoms in breast: Secondary | ICD-10-CM | POA: Diagnosis not present

## 2013-07-13 ENCOUNTER — Ambulatory Visit (INDEPENDENT_AMBULATORY_CARE_PROVIDER_SITE_OTHER)
Admission: RE | Admit: 2013-07-13 | Discharge: 2013-07-13 | Disposition: A | Discharge status: Home / Self Care | Payer: Medicare Other | Source: Ambulatory Visit | Attending: Emergency Medicine | Admitting: Emergency Medicine

## 2013-07-13 ENCOUNTER — Encounter: Payer: Self-pay | Admitting: Emergency Medicine

## 2013-07-13 ENCOUNTER — Ambulatory Visit (INDEPENDENT_AMBULATORY_CARE_PROVIDER_SITE_OTHER): Payer: Medicare Other | Admitting: Emergency Medicine

## 2013-07-13 VITALS — BP 114/58 | HR 68 | Temp 97.1°F | Ht 65.0 in | Wt 167.8 lb

## 2013-07-13 DIAGNOSIS — J449 Chronic obstructive pulmonary disease, unspecified: Secondary | ICD-10-CM | POA: Diagnosis not present

## 2013-07-13 DIAGNOSIS — L930 Discoid lupus erythematosus: Secondary | ICD-10-CM

## 2013-07-13 DIAGNOSIS — J4489 Other specified chronic obstructive pulmonary disease: Secondary | ICD-10-CM

## 2013-07-13 DIAGNOSIS — J438 Other emphysema: Secondary | ICD-10-CM | POA: Diagnosis not present

## 2013-07-13 DIAGNOSIS — Z23 Encounter for immunization: Secondary | ICD-10-CM

## 2013-07-13 NOTE — Patient Instructions (Addendum)
CXR and Flu shot today Please continue your Advair daily Use albuterol as needed Follow with Dr Delton Coombes in 12 months or sooner if you have any problems

## 2013-07-13 NOTE — Assessment & Plan Note (Signed)
Stable on advair qd. saba prn.

## 2013-07-13 NOTE — Assessment & Plan Note (Signed)
CXR today Remains on MTX

## 2013-07-13 NOTE — Progress Notes (Signed)
72 yo woman with HTN, former tobacco. Dx with severe AFL and severe COPD.   ROV 11/21/09 -- She is taking Advair qam instead of two times a day. Continues to be active, walks on treadmill once daily. Able to sing in the choir. No exacerbations since last visit, does not wheeze. Seems to be tolerating the Advair without much cough. 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 Seems to be tolerating.   ROV 12/06/10 -- f/u for severe COPD, also w hx of discoid SLE. annual f/u. Was treated for URI and exacerbation with steroids. No other flares in last year. Still able to exert, able to sing in the choir. Still has a bit of a raspy voice. Using Advair in the am only (keeps her from sleeping at night). Never uses her ProAir.   ROV 01/03/12 -- f/u for severe COPD, also w hx of discoid SLE and psoriasis now on MTX in an effort to get her off plaquanil. Regular follow up, says her breathing is doing ok, still sings in the choir. Still gets exertional SOB. Has a raspy voice at all times. Still on Advair qd, rarely needs ProAir.   ROV 07/02/12 -- f/u for severe COPD, also w hx of discoid SLE and psoriasis now on MTX and plaquanil.   Has been taking advair qd. Had CXR today (on MTX) >> No change compared with 11/09.   ROV 07/13/13 -- severe COPD, also w hx of discoid SLE and psoriasis now on MTX (stopped plaquanil in the last year). Last time tried spiriva, gave her a dry cough. Seems to be doing well on advair qd. No CXR yet today. No exacerbations. Good functional capacity.   Filed Vitals:   07/13/13 0926  BP: 114/58  Pulse: 68  Temp: 97.1 F (36.2 C)  TempSrc: Oral  Height: 5\' 5"  (1.651 m)  Weight: 167 lb 12.8 oz (76.114 kg)  SpO2: 97%   Gen: Pleasant, well-nourished, in no distress,  normal affect  ENT: No lesions,  mouth clear,  oropharynx clear, no postnasal drip  Neck: No JVD, no TMG, no carotid bruits  Lungs: No use of accessory muscles, clear without rales  or rhonchi  Cardiovascular: RRR, heart sounds normal, no murmur or gallops, no peripheral edema  Musculoskeletal: No deformities, no cyanosis or clubbing  Neuro: alert, non focal  Skin: Warm, no lesions or rashes  C O P D Stable on advair qd. saba prn.    LUPUS ERYTHEMATOSUS, DISCOID CXR today Remains on MTX

## 2013-07-14 NOTE — Progress Notes (Signed)
Quick Note:  Informed pt of CXR results and she verbalized understanding has no further questions or concerns at this time ______

## 2013-08-25 DIAGNOSIS — L408 Other psoriasis: Secondary | ICD-10-CM | POA: Diagnosis not present

## 2013-08-25 DIAGNOSIS — C44721 Squamous cell carcinoma of skin of unspecified lower limb, including hip: Secondary | ICD-10-CM | POA: Diagnosis not present

## 2013-08-25 DIAGNOSIS — D485 Neoplasm of uncertain behavior of skin: Secondary | ICD-10-CM | POA: Diagnosis not present

## 2013-08-25 DIAGNOSIS — Z79899 Other long term (current) drug therapy: Secondary | ICD-10-CM | POA: Diagnosis not present

## 2013-08-25 DIAGNOSIS — Z85828 Personal history of other malignant neoplasm of skin: Secondary | ICD-10-CM | POA: Diagnosis not present

## 2013-09-01 DIAGNOSIS — Z6828 Body mass index (BMI) 28.0-28.9, adult: Secondary | ICD-10-CM | POA: Diagnosis not present

## 2013-09-01 DIAGNOSIS — K1120 Sialoadenitis, unspecified: Secondary | ICD-10-CM | POA: Diagnosis not present

## 2013-09-30 DIAGNOSIS — R82998 Other abnormal findings in urine: Secondary | ICD-10-CM | POA: Diagnosis not present

## 2013-09-30 DIAGNOSIS — I739 Peripheral vascular disease, unspecified: Secondary | ICD-10-CM | POA: Diagnosis not present

## 2013-09-30 DIAGNOSIS — D649 Anemia, unspecified: Secondary | ICD-10-CM | POA: Diagnosis not present

## 2013-09-30 DIAGNOSIS — I10 Essential (primary) hypertension: Secondary | ICD-10-CM | POA: Diagnosis not present

## 2013-10-07 DIAGNOSIS — Z1212 Encounter for screening for malignant neoplasm of rectum: Secondary | ICD-10-CM | POA: Diagnosis not present

## 2013-10-07 DIAGNOSIS — I700 Atherosclerosis of aorta: Secondary | ICD-10-CM | POA: Diagnosis not present

## 2013-10-07 DIAGNOSIS — K1120 Sialoadenitis, unspecified: Secondary | ICD-10-CM | POA: Diagnosis not present

## 2013-10-07 DIAGNOSIS — Z23 Encounter for immunization: Secondary | ICD-10-CM | POA: Diagnosis not present

## 2013-10-07 DIAGNOSIS — I872 Venous insufficiency (chronic) (peripheral): Secondary | ICD-10-CM | POA: Diagnosis not present

## 2013-10-07 DIAGNOSIS — L930 Discoid lupus erythematosus: Secondary | ICD-10-CM | POA: Diagnosis not present

## 2013-10-07 DIAGNOSIS — G609 Hereditary and idiopathic neuropathy, unspecified: Secondary | ICD-10-CM | POA: Diagnosis not present

## 2013-10-07 DIAGNOSIS — Z0000 Encounter for general adult medical examination without abnormal findings: Secondary | ICD-10-CM | POA: Diagnosis not present

## 2013-10-07 DIAGNOSIS — I10 Essential (primary) hypertension: Secondary | ICD-10-CM | POA: Diagnosis not present

## 2013-10-07 DIAGNOSIS — D649 Anemia, unspecified: Secondary | ICD-10-CM | POA: Diagnosis not present

## 2013-10-25 DIAGNOSIS — K1120 Sialoadenitis, unspecified: Secondary | ICD-10-CM | POA: Diagnosis not present

## 2013-12-09 DIAGNOSIS — L408 Other psoriasis: Secondary | ICD-10-CM | POA: Diagnosis not present

## 2013-12-09 DIAGNOSIS — D485 Neoplasm of uncertain behavior of skin: Secondary | ICD-10-CM | POA: Diagnosis not present

## 2013-12-09 DIAGNOSIS — L570 Actinic keratosis: Secondary | ICD-10-CM | POA: Diagnosis not present

## 2013-12-09 DIAGNOSIS — C44721 Squamous cell carcinoma of skin of unspecified lower limb, including hip: Secondary | ICD-10-CM | POA: Diagnosis not present

## 2013-12-09 DIAGNOSIS — Z85828 Personal history of other malignant neoplasm of skin: Secondary | ICD-10-CM | POA: Diagnosis not present

## 2013-12-09 DIAGNOSIS — Z79899 Other long term (current) drug therapy: Secondary | ICD-10-CM | POA: Diagnosis not present

## 2014-01-06 DIAGNOSIS — L930 Discoid lupus erythematosus: Secondary | ICD-10-CM | POA: Diagnosis not present

## 2014-01-06 DIAGNOSIS — L408 Other psoriasis: Secondary | ICD-10-CM | POA: Diagnosis not present

## 2014-01-06 DIAGNOSIS — R6889 Other general symptoms and signs: Secondary | ICD-10-CM | POA: Diagnosis not present

## 2014-01-11 DIAGNOSIS — H2510 Age-related nuclear cataract, unspecified eye: Secondary | ICD-10-CM | POA: Diagnosis not present

## 2014-01-17 DIAGNOSIS — M3500 Sicca syndrome, unspecified: Secondary | ICD-10-CM | POA: Diagnosis not present

## 2014-01-17 DIAGNOSIS — Z6828 Body mass index (BMI) 28.0-28.9, adult: Secondary | ICD-10-CM | POA: Diagnosis not present

## 2014-01-17 DIAGNOSIS — K1120 Sialoadenitis, unspecified: Secondary | ICD-10-CM | POA: Diagnosis not present

## 2014-01-17 DIAGNOSIS — I10 Essential (primary) hypertension: Secondary | ICD-10-CM | POA: Diagnosis not present

## 2014-01-18 DIAGNOSIS — R221 Localized swelling, mass and lump, neck: Secondary | ICD-10-CM | POA: Diagnosis not present

## 2014-01-18 DIAGNOSIS — R220 Localized swelling, mass and lump, head: Secondary | ICD-10-CM | POA: Diagnosis not present

## 2014-01-18 DIAGNOSIS — K1120 Sialoadenitis, unspecified: Secondary | ICD-10-CM | POA: Diagnosis not present

## 2014-01-19 ENCOUNTER — Other Ambulatory Visit: Payer: Self-pay | Admitting: Otolaryngology

## 2014-01-19 DIAGNOSIS — R220 Localized swelling, mass and lump, head: Secondary | ICD-10-CM

## 2014-01-19 DIAGNOSIS — R221 Localized swelling, mass and lump, neck: Principal | ICD-10-CM

## 2014-01-19 DIAGNOSIS — K118 Other diseases of salivary glands: Secondary | ICD-10-CM

## 2014-01-21 ENCOUNTER — Ambulatory Visit
Admission: RE | Admit: 2014-01-21 | Discharge: 2014-01-21 | Disposition: A | Discharge status: Home / Self Care | Payer: Medicare Other | Source: Ambulatory Visit | Attending: Otolaryngology | Admitting: Otolaryngology

## 2014-01-21 DIAGNOSIS — R220 Localized swelling, mass and lump, head: Secondary | ICD-10-CM | POA: Diagnosis not present

## 2014-01-21 DIAGNOSIS — R221 Localized swelling, mass and lump, neck: Secondary | ICD-10-CM | POA: Diagnosis not present

## 2014-01-21 DIAGNOSIS — K118 Other diseases of salivary glands: Secondary | ICD-10-CM

## 2014-01-21 MED ORDER — IOHEXOL 300 MG/ML  SOLN
75.0000 mL | Freq: Once | INTRAMUSCULAR | Status: AC | PRN
  Administered 2014-01-21: 75 mL via INTRAVENOUS

## 2014-01-26 ENCOUNTER — Other Ambulatory Visit: Payer: Self-pay | Admitting: Podiatry

## 2014-01-26 ENCOUNTER — Encounter: Payer: Self-pay | Admitting: Podiatry

## 2014-01-26 ENCOUNTER — Ambulatory Visit (INDEPENDENT_AMBULATORY_CARE_PROVIDER_SITE_OTHER): Payer: Medicare Other | Admitting: Podiatry

## 2014-01-26 VITALS — BP 142/86 | HR 64 | Resp 12

## 2014-01-26 DIAGNOSIS — L600 Ingrowing nail: Secondary | ICD-10-CM | POA: Diagnosis not present

## 2014-01-26 DIAGNOSIS — R0989 Other specified symptoms and signs involving the circulatory and respiratory systems: Secondary | ICD-10-CM

## 2014-01-26 NOTE — Progress Notes (Signed)
   Subjective:    Patient ID: Casey Wilkinson, female    DOB: 06/04/1941, 73 y.o.   MRN: [id number]  HPI PT STATED LT FOOT GREAT TOENAIL IS SORE FOR ABOUT 2 MONTHS. THE TOENAILS IS GETTING WORSE. THE TOENAIL GET AGGRAVATED BY PRESSURE AND TRIED NO TREATMENT.  The medial and lateral borders of the left hallux toenail were excised for permanent correction on 04/28/2009. Prior to surgical intervention an arterial Doppler examination was ordered on 04/27/2009. The results of this examination were ABIs with biphasic Doppler waveforms noted on the right leg. Normal ABIs with biphasic Doppler waveform noted on the left leg. Abnormal TBI noted bilaterally(right TBI 0.48 and left TBI 0.55) A consult with vascular surgeon on 04/13/2009 stated that the patient shows evidence of mild peripheral vascular disease in her feet are well perfused. I do feel confident that she can undergo treatment for ingrown toenail if required.    Review of Systems  All other systems reviewed and are negative.      Objective:   Physical Exam  Orientated x3 73 year old white female  Vascular: DP is are 2/4 bilaterally. PTs are 0/4 bilaterally.  Neurological: Sensation to 10 g monofilament wire intact 5/5 bilaterally. Vibratory sensation intact bilaterally. Ankle reflexes reactive bilaterally.  Dermatological the medial lateral borders of the left hallux toenail appeared narrowed, however, the margins are calloused and are tender to pressure. There is no erythema, edema, warmth or drainage noted from the borders of the left hallux toenail.  Musculoskeletal: No deformities noted        Assessment & Plan:    Assessment: Recurrence of ingrowing medial and lateral borders of the left hallux nail  Vascular status requires further evaluation prior to surgical intervention, because of previous peripheral vascular disease and decreased pedal pulses today.  Plan: Patient is referred for arterial Doppler and  consultation with vascular surgeon at the office of vascular vein in [facility] where she had a prior vascular evaluation.  I will defer any surgical intervention until  the report of the consultation from the vascular surgeon concerning the vascular status and the ability heal a surgical wound on the left hallux.  Notify patient on receipt of consultation from vascular surgeon.

## 2014-01-26 NOTE — Patient Instructions (Signed)
Our office will help you schedule an appointment with the vascular lab in consultation to determine the vascular status and ability to heal a surgical wound on the left great toe.

## 2014-01-27 ENCOUNTER — Encounter: Payer: Self-pay | Admitting: Podiatry

## 2014-02-14 DIAGNOSIS — K1120 Sialoadenitis, unspecified: Secondary | ICD-10-CM | POA: Diagnosis not present

## 2014-02-23 ENCOUNTER — Encounter: Payer: Self-pay | Admitting: Vascular Surgery

## 2014-02-24 ENCOUNTER — Ambulatory Visit (HOSPITAL_COMMUNITY)
Admission: RE | Admit: 2014-02-24 | Discharge: 2014-02-24 | Disposition: A | Discharge status: Home / Self Care | Payer: Medicare Other | Source: Ambulatory Visit | Attending: Vascular Surgery | Admitting: Vascular Surgery

## 2014-02-24 ENCOUNTER — Encounter: Payer: Self-pay | Admitting: Vascular Surgery

## 2014-02-24 ENCOUNTER — Ambulatory Visit (INDEPENDENT_AMBULATORY_CARE_PROVIDER_SITE_OTHER): Payer: Medicare Other | Admitting: Vascular Surgery

## 2014-02-24 VITALS — BP 128/52 | HR 76 | Ht 65.0 in | Wt 168.0 lb

## 2014-02-24 DIAGNOSIS — I739 Peripheral vascular disease, unspecified: Secondary | ICD-10-CM

## 2014-02-24 DIAGNOSIS — R0989 Other specified symptoms and signs involving the circulatory and respiratory systems: Secondary | ICD-10-CM | POA: Insufficient documentation

## 2014-02-24 NOTE — Addendum Note (Signed)
Addended by: Mena Goes on: 02/24/2014 01:26 PM   Modules accepted: Orders

## 2014-02-24 NOTE — Progress Notes (Signed)
[facility] [place] HISTORY AND PHYSICAL   History of Present Illness:  Patient is a 73 y.o. year old female who presents for evaluation of peripheral arterial disease in her lower extremities as well as her carotid artery. The patient was previously seen by my partner Dr. Amedeo Plenty in 2010. At that time she was being evaluated for an ingrown toenail. She had evidence of mild peripheral arterial disease at that time. She underwent operation for ingrown toenail and successfully heal this without any vascular intervention. She denies any claudication symptoms. She denies rest pain. She denies any trouble healing wounds on her feet.  She is also here today for evaluation of a carotid stenosis. She denies any symptoms of TIA amaurosis or stroke. She is on aspirin and statin. She was noted to have a right carotid stenosis on a recent CT scan of the neck done to rule out a parotid tumor.  Other medical problems include lupus, hypertension, COPD, anemia. All these problems are currently controlled. She is a former smoker but quit many years ago.  Past Medical History  Diagnosis Date  . Discoid lupus erythematosus   . Hypertension   . COPD (chronic obstructive pulmonary disease)   . Anemia     Past Surgical History  Procedure Laterality Date  . Total vaginal hysterectomy    . Hemmroids    . Recocele/entracele      Social History History  Substance Use Topics  . Smoking status: Former Smoker -- 0.50 packs/day for 50 years    Types: Cigarettes    Quit date: 12/05/2000  . Smokeless tobacco: Never Used     Comment: started smoking in early 20s  . Alcohol Use: No    Family History Family History  Problem Relation Age of Onset  . Heart disease Father   . Hyperlipidemia Father   . Hypertension Father     Allergies  Allergies  Allergen Reactions  . Prochlorperazine Edisylate     **COMPAZINE**   Stroke-like symptoms  . Sulfur     Pt taking Methotrexate, Sulfur drugs  could cause SEVERE fatal reaction.   . Doxycycline     hives     Current Outpatient Prescriptions  Medication Sig Dispense Refill  . aspirin 81 MG tablet Take 81 mg by mouth daily.      Marland Kitchen atorvastatin (LIPITOR) 10 MG tablet Take 1 tablet by mouth daily.      . Calcium Carbonate-Vitamin D 600-400 MG-UNIT per tablet Take 2 tablets by mouth daily.      . cetirizine (ZYRTEC) 5 MG chewable tablet Chew 5 mg by mouth daily.      Marland Kitchen docusate sodium (COLACE) 100 MG capsule Take 100 mg by mouth 2 (two) times daily.      . Fluticasone-Salmeterol (ADVAIR) 250-50 MCG/DOSE AEPB Inhale 1 puff into the lungs every 12 (twelve) hours.      . folic acid (FOLVITE) 1 MG tablet Take 1 mg by mouth daily.      . Lecithin 1200 MG CAPS Take 1,200 mg by mouth 3 (three) times daily.      Marland Kitchen METHOTREXATE, ANTI-RHEUMATIC, PO Take 25 mg by mouth daily.      . Multiple Vitamins-Minerals (ABC PLUS SENIOR) TABS Take by mouth.      . Potassium Gluconate 595 MG CAPS Take 1 capsule by mouth daily.      . valsartan-hydrochlorothiazide (DIOVAN-HCT) 160-12.5 MG per tablet Take 1 tablet by mouth daily.       Marland Kitchen  albuterol (PROVENTIL HFA;VENTOLIN HFA) 108 (90 BASE) MCG/ACT inhaler Inhale 2 puffs into the lungs every 6 (six) hours as needed.      . methotrexate (RHEUMATREX) 2.5 MG tablet Take 2.5 mg by mouth once a week. Take 2 tabs QAM, Take 2 tabs QHS Every Tuesday. Caution:Chemotherapy. Protect from light.      . vitamin B-12 (CYANOCOBALAMIN) 500 MCG tablet Take 2,500 mcg by mouth daily.       . Vitamins C E 500-400 MG-UNIT CAPS Take by mouth.       No current facility-administered medications for this visit.    ROS:   General:  No weight loss, Fever, chills  HEENT: No recent headaches, no nasal bleeding, no visual changes, no sore throat  Neurologic: No dizziness, blackouts, seizures. No recent symptoms of stroke or mini- stroke. No recent episodes of slurred speech, or temporary blindness.  Cardiac: No recent episodes of  chest pain/pressure, no shortness of breath at rest.  No shortness of breath with exertion.  Denies history of atrial fibrillation or irregular heartbeat  Vascular: No history of rest pain in feet.  No history of claudication.  No history of non-healing ulcer, No history of DVT   Pulmonary: No home oxygen, no productive cough, no hemoptysis,  No asthma or wheezing  Musculoskeletal:  [ ]  Arthritis, [ ]  Low back pain,  [ ]  Joint pain  Hematologic:No history of hypercoagulable state.  No history of easy bleeding.  No history of anemia  Gastrointestinal: No hematochezia or melena,  No gastroesophageal reflux, no trouble swallowing  Urinary: [ ]  chronic Kidney disease, [ ]  on HD - [ ]  MWF or [ ]  TTHS, [ ]  Burning with urination, [ ]  Frequent urination, [ ]  Difficulty urinating;   Skin: No rashes  Psychological: No history of anxiety,  No history of depression   Physical Examination  Filed Vitals:   02/24/14 1212  BP: 128/52  Pulse: 76  Height: 5\' 5"  (1.651 m)  Weight: 168 lb (76.204 kg)  SpO2: 98%    Body mass index is 27.96 kg/(m^2).  General:  Alert and oriented, no acute distress HEENT: Normal Neck: No bruit or JVD Pulmonary: Clear to auscultation bilaterally Cardiac: Regular Rate and Rhythm without murmur Abdomen: Soft, non-tender, non-distended, no mass Skin: No rash, multiple scattered varicosities bilateral lower extremities one to 3 mm diameter Extremity Pulses:  2+ radial, brachial, femoral, absent dorsalis pedis, posterior tibial pulses bilaterally Musculoskeletal: No deformity or edema  Neurologic: Upper and lower extremity motor 5/5 and symmetric  DATA:  I reviewed the patient's recent CT scan of the neck. This shows a 60% right internal carotid artery stenosis. Minimal stenosis left internal carotid artery. The patient had bilateral ABIs performed in our office today. I reviewed and interpreted this study. ABIs were greater than 1 bilaterally with biphasic Doppler  flow suggestive of again mild peripheral arterial occlusive disease.   ASSESSMENT:  #1 peripheral arterial disease lower extremities stable from 2010 has reasonable perfusion to both lower extremities essentially no symptoms should have adequate blood flow for healing any podiatry procedures #2 mild to moderate right internal carotid artery stenosis asymptomatic needs repeat carotid duplex scan in 6 months #3 bilateral varicose veins which may be contributed some to her occasional leg pain. Compression stocking prescription given today.   PLAN:  See above  Ruta Hinds, MD [facility] [place] Office: [phone] Pager: [phone]

## 2014-03-09 DIAGNOSIS — L570 Actinic keratosis: Secondary | ICD-10-CM | POA: Diagnosis not present

## 2014-03-09 DIAGNOSIS — D485 Neoplasm of uncertain behavior of skin: Secondary | ICD-10-CM | POA: Diagnosis not present

## 2014-03-09 DIAGNOSIS — Z85828 Personal history of other malignant neoplasm of skin: Secondary | ICD-10-CM | POA: Diagnosis not present

## 2014-03-09 DIAGNOSIS — Z79899 Other long term (current) drug therapy: Secondary | ICD-10-CM | POA: Diagnosis not present

## 2014-03-09 DIAGNOSIS — D0470 Carcinoma in situ of skin of unspecified lower limb, including hip: Secondary | ICD-10-CM | POA: Diagnosis not present

## 2014-03-09 DIAGNOSIS — L408 Other psoriasis: Secondary | ICD-10-CM | POA: Diagnosis not present

## 2014-03-09 DIAGNOSIS — C44721 Squamous cell carcinoma of skin of unspecified lower limb, including hip: Secondary | ICD-10-CM | POA: Diagnosis not present

## 2014-03-15 ENCOUNTER — Telehealth: Payer: Self-pay | Admitting: *Deleted

## 2014-03-15 ENCOUNTER — Encounter: Payer: Self-pay | Admitting: *Deleted

## 2014-03-15 DIAGNOSIS — I739 Peripheral vascular disease, unspecified: Secondary | ICD-10-CM

## 2014-03-15 NOTE — Telephone Encounter (Signed)
Message copied by Lolita Rieger on Tue Mar 15, 2014  9:06 AM ------      Message from: Gean Birchwood      Created: Sat Mar 12, 2014 11:46 AM       Results of arterial Doppler 02/25/2014      Abnormal right and left toe brachial indices            Arrange for direct referral to Dr.Fields  for evaluation and medical clearance to undergo  toenail surgery under local      Contact patient and make aware that further evaluation is needed prior to nail surgery      Contact Dr. Eden Lathe to arrange this and attach our standard letter of medical clearance for this examination and asked if there is any contraindication for this procedure ------

## 2014-03-15 NOTE — Telephone Encounter (Signed)
Per Dr. Amalia Hailey, I called to inform the patient that her doppler was abnormal and that Dr. Amalia Hailey wants her to follow up with Dr. Oneida Alar for a consultation.  She stated she has already seen Dr. Oneida Alar the day she had the doppler.  He said everything was okay to have the toenail worked on.  She stated I don't know where the mix up came from.  I told her I just sent a medical clearance letter to him.  We should hear something soon.  She stated oh okay.

## 2014-03-17 ENCOUNTER — Telehealth: Payer: Self-pay | Admitting: *Deleted

## 2014-03-17 NOTE — Telephone Encounter (Signed)
Left Delydia a phone message that I was sending Dr. Amalia Hailey a Staff message through [facility]. Casey Wilkinson has been cleared, from a peripheral vascular standpoint, to have her toenail surgery per Dr. Ruta Hinds.

## 2014-03-17 NOTE — Telephone Encounter (Signed)
Dr. Oneida Alar is giving clearance in regards to Peripheral Artery Disease.  No cardiac clearance or anything like that.  I know you may be on EPIC not sure.  Let me know if you need me to fax something.

## 2014-03-18 DIAGNOSIS — L98499 Non-pressure chronic ulcer of skin of other sites with unspecified severity: Secondary | ICD-10-CM | POA: Diagnosis not present

## 2014-04-08 DIAGNOSIS — L930 Discoid lupus erythematosus: Secondary | ICD-10-CM | POA: Diagnosis not present

## 2014-04-08 DIAGNOSIS — I6529 Occlusion and stenosis of unspecified carotid artery: Secondary | ICD-10-CM | POA: Diagnosis not present

## 2014-04-08 DIAGNOSIS — E785 Hyperlipidemia, unspecified: Secondary | ICD-10-CM | POA: Diagnosis not present

## 2014-04-08 DIAGNOSIS — M3500 Sicca syndrome, unspecified: Secondary | ICD-10-CM | POA: Diagnosis not present

## 2014-04-08 DIAGNOSIS — I872 Venous insufficiency (chronic) (peripheral): Secondary | ICD-10-CM | POA: Diagnosis not present

## 2014-04-08 DIAGNOSIS — I10 Essential (primary) hypertension: Secondary | ICD-10-CM | POA: Diagnosis not present

## 2014-04-08 DIAGNOSIS — Z1331 Encounter for screening for depression: Secondary | ICD-10-CM | POA: Diagnosis not present

## 2014-04-08 DIAGNOSIS — I700 Atherosclerosis of aorta: Secondary | ICD-10-CM | POA: Diagnosis not present

## 2014-04-08 DIAGNOSIS — G609 Hereditary and idiopathic neuropathy, unspecified: Secondary | ICD-10-CM | POA: Diagnosis not present

## 2014-05-10 DIAGNOSIS — E785 Hyperlipidemia, unspecified: Secondary | ICD-10-CM | POA: Diagnosis not present

## 2014-06-09 DIAGNOSIS — Z79899 Other long term (current) drug therapy: Secondary | ICD-10-CM | POA: Diagnosis not present

## 2014-06-09 DIAGNOSIS — D485 Neoplasm of uncertain behavior of skin: Secondary | ICD-10-CM | POA: Diagnosis not present

## 2014-06-09 DIAGNOSIS — Z85828 Personal history of other malignant neoplasm of skin: Secondary | ICD-10-CM | POA: Diagnosis not present

## 2014-06-09 DIAGNOSIS — L570 Actinic keratosis: Secondary | ICD-10-CM | POA: Diagnosis not present

## 2014-06-09 DIAGNOSIS — L408 Other psoriasis: Secondary | ICD-10-CM | POA: Diagnosis not present

## 2014-06-09 DIAGNOSIS — C44721 Squamous cell carcinoma of skin of unspecified lower limb, including hip: Secondary | ICD-10-CM | POA: Diagnosis not present

## 2014-07-05 DIAGNOSIS — Z1231 Encounter for screening mammogram for malignant neoplasm of breast: Secondary | ICD-10-CM | POA: Diagnosis not present

## 2014-07-14 ENCOUNTER — Ambulatory Visit (INDEPENDENT_AMBULATORY_CARE_PROVIDER_SITE_OTHER): Payer: Medicare Other | Admitting: Emergency Medicine

## 2014-07-14 ENCOUNTER — Encounter: Payer: Self-pay | Admitting: Emergency Medicine

## 2014-07-14 VITALS — BP 110/62 | HR 86 | Temp 97.7°F | Ht 64.0 in | Wt 170.4 lb

## 2014-07-14 DIAGNOSIS — J4489 Other specified chronic obstructive pulmonary disease: Secondary | ICD-10-CM

## 2014-07-14 DIAGNOSIS — J449 Chronic obstructive pulmonary disease, unspecified: Secondary | ICD-10-CM

## 2014-07-14 DIAGNOSIS — Z23 Encounter for immunization: Secondary | ICD-10-CM

## 2014-07-14 NOTE — Patient Instructions (Addendum)
Please continue your Advair daily. If your throat continues to be scratchy we can consider changing to an alternative.  Continue your loratadine 5mg  as you have been taking it.  Flu shot today Follow with Dr Lamonte Sakai in 12 months or sooner if you have any problems

## 2014-07-14 NOTE — Assessment & Plan Note (Signed)
Please continue your Advair daily. If your throat continues to be scratchy we can consider changing to an alternative.  Continue your loratadine 5mg  as you have been taking it.  Flu shot today Follow with Dr Lamonte Sakai in 12 months or sooner if you have any problems

## 2014-07-14 NOTE — Progress Notes (Signed)
73 yo Casey Wilkinson with HTN, former tobacco. Dx with severe AFL and severe COPD.   ROV 11/21/09 -- She is taking Advair qam instead of two times a day. Continues to be active, walks on treadmill once daily. Able to sing in the choir. No exacerbations since last visit, does not wheeze. Seems to be tolerating the Advair without much cough. 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 Seems to be tolerating.   ROV 12/06/10 -- f/u for severe COPD, also w hx of discoid SLE. annual f/u. Was treated for URI and exacerbation with steroids. No other flares in last year. Still able to exert, able to sing in the choir. Still has a bit of a raspy voice. Using Advair in the am only (keeps her from sleeping at night). Never uses her ProAir.   ROV 01/03/12 -- f/u for severe COPD, also w hx of discoid SLE and psoriasis now on MTX in an effort to get her off plaquanil. Regular follow up, says her breathing is doing ok, still sings in the choir. Still gets exertional SOB. Has a raspy voice at all times. Still on Advair qd, rarely needs ProAir.   ROV 07/02/12 -- f/u for severe COPD, also w hx of discoid SLE and psoriasis now on MTX and plaquanil.   Has been taking advair qd. Had CXR today (on MTX) >> No change compared with 11/09.   ROV 07/13/13 -- severe COPD, also w hx of discoid SLE and psoriasis now on MTX (stopped plaquanil in the last year). Last time tried spiriva, gave her a dry cough. Seems to be doing well on advair qd. No CXR yet today. No exacerbations. Good functional capacity.   ROV 07/14/14 -- follow up visit for COPD, SLE/ psoriasis on MTX. She has been doing well - no flares. She does have allergies, is using loratadine 5mg . She is on Advair qd, seems to tolerate but has some scratchy voice.   Filed Vitals:   07/14/14 0945  BP: 110/62  Pulse: 86  Temp: 97.7 F (36.5 C)  TempSrc: Oral  Height: 5\' 4"  (1.626 m)  Weight: 170 lb 6.4 oz (77.293 kg)  SpO2: 94%   Gen:  Pleasant, well-nourished, in no distress,  normal affect  ENT: No lesions,  mouth clear,  oropharynx clear, no postnasal drip  Neck: No JVD, no TMG, no carotid bruits  Lungs: No use of accessory muscles, clear without rales or rhonchi  Cardiovascular: RRR, heart sounds normal, no murmur or gallops, no peripheral edema  Musculoskeletal: No deformities, no cyanosis or clubbing  Neuro: alert, non focal  Skin: Warm, no lesions or rashes  C O P D Please continue your Advair daily. If your throat continues to be scratchy we can consider changing to an alternative.  Continue your loratadine 5mg  as you have been taking it.  Flu shot today Follow with Dr Lamonte Sakai in 12 months or sooner if you have any problems

## 2014-08-29 ENCOUNTER — Encounter: Payer: Self-pay | Admitting: Vascular Surgery

## 2014-08-30 ENCOUNTER — Ambulatory Visit (INDEPENDENT_AMBULATORY_CARE_PROVIDER_SITE_OTHER): Payer: Medicare Other | Admitting: Family

## 2014-08-30 ENCOUNTER — Other Ambulatory Visit: Payer: Self-pay | Admitting: Vascular Surgery

## 2014-08-30 ENCOUNTER — Ambulatory Visit (HOSPITAL_COMMUNITY)
Admission: RE | Admit: 2014-08-30 | Discharge: 2014-08-30 | Disposition: A | Discharge status: Home / Self Care | Payer: Medicare Other | Source: Ambulatory Visit | Attending: Family | Admitting: Family

## 2014-08-30 ENCOUNTER — Encounter: Payer: Self-pay | Admitting: Family

## 2014-08-30 VITALS — BP 128/72 | HR 76 | Resp 16 | Ht 64.0 in | Wt 170.0 lb

## 2014-08-30 DIAGNOSIS — I6523 Occlusion and stenosis of bilateral carotid arteries: Secondary | ICD-10-CM | POA: Diagnosis not present

## 2014-08-30 DIAGNOSIS — I739 Peripheral vascular disease, unspecified: Secondary | ICD-10-CM

## 2014-08-30 NOTE — Progress Notes (Signed)
[facility] [place] HISTORY AND PHYSICAL   MRN : [id number]  History of Present Illness:   Casey Wilkinson is a 74 y.o. female patient of Dr. Oneida Alar who has known mild PAD and carotid artery stenosis. She returns today for carotid Duplex and evaluation. CT scan of the neck earlier in 2015 showed a 60% right internal carotid artery stenosis with minimal stenosis left internal carotid artery.  The patient denies any history of stroke or TIA symptoms. She denies any known heart problems.  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  Pt Diabetic: No Pt smoker: former smoker, quit in 2000  Pt meds include: Statin :Yes ASA: Yes Other anticoagulants/antiplatelets: no  Current Outpatient Prescriptions  Medication Sig Dispense Refill  . albuterol (PROVENTIL HFA;VENTOLIN HFA) 108 (90 BASE) MCG/ACT inhaler Inhale 2 puffs into the lungs every 6 (six) hours as needed.    Marland Kitchen aspirin 81 MG tablet Take 81 mg by mouth daily.    Marland Kitchen atorvastatin (LIPITOR) 10 MG tablet Take 1 tablet by mouth daily.    . Calcium Carbonate-Vitamin D 600-400 MG-UNIT per tablet Take 2 tablets by mouth daily.    . cetirizine (ZYRTEC) 5 MG chewable tablet Chew 5 mg by mouth daily.    Marland Kitchen docusate sodium (COLACE) 100 MG capsule Take 100 mg by mouth 2 (two) times daily.    . Fluticasone-Salmeterol (ADVAIR) 250-50 MCG/DOSE AEPB Inhale 1 puff into the lungs every 12 (twelve) hours.    . folic acid (FOLVITE) 1 MG tablet Take 1 mg by mouth daily.    . Lecithin 1200 MG CAPS Take 1,200 mg by mouth 3 (three) times daily.    Marland Kitchen loratadine (CLARITIN) 10 MG tablet Take 5 mg by mouth daily.     . methotrexate (RHEUMATREX) 2.5 MG tablet Take 2.5 mg by mouth once a week. Take 2 tabs QAM, Take 2 tabs QHS Every Tuesday. Caution:Chemotherapy. Protect from light.    . METHOTREXATE, ANTI-RHEUMATIC, PO Take 25 mg by mouth  daily.    . Multiple Vitamins-Minerals (ABC PLUS SENIOR) TABS Take by mouth.    . Potassium Gluconate 595 MG CAPS Take 1 capsule by mouth daily.    . valsartan-hydrochlorothiazide (DIOVAN-HCT) 160-12.5 MG per tablet Take 1 tablet by mouth daily.     . vitamin B-12 (CYANOCOBALAMIN) 500 MCG tablet Take 2,500 mcg by mouth daily.     . Vitamins C E 500-400 MG-UNIT CAPS Take by mouth.     No current facility-administered medications for this visit.    Past Medical History  Diagnosis Date  . Discoid lupus erythematosus   . Hypertension   . COPD (chronic obstructive pulmonary disease)   . Anemia     Social History History  Substance Use Topics  . Smoking status: Former Smoker -- 0.50 packs/day for 50 years    Types: Cigarettes    Quit date: 12/05/2000  . Smokeless tobacco: Never Used     Comment: started smoking in early 20s  . Alcohol Use: No    Family History Family History  Problem Relation Age of Onset  . Heart disease Father     Before age 62  . Hyperlipidemia Father   . Hypertension Father   . Pneumonia Mother   . Alzheimer's disease Mother     Surgical History Past Surgical History  Procedure Laterality Date  . Total vaginal hysterectomy  1968  . Hemmroids  2006  . Recocele/entracele  2006  . Abdominal hysterectomy  1968    Allergies  Allergen Reactions  . Prochlorperazine Edisylate     **COMPAZINE**   Stroke-like symptoms  . Sulfur     Pt taking Methotrexate, Sulfur drugs could cause SEVERE fatal reaction.   . Doxycycline     hives    Current Outpatient Prescriptions  Medication Sig Dispense Refill  . albuterol (PROVENTIL HFA;VENTOLIN HFA) 108 (90 BASE) MCG/ACT inhaler Inhale 2 puffs into the lungs every 6 (six) hours as needed.    Marland Kitchen aspirin 81 MG tablet Take 81 mg by mouth daily.    Marland Kitchen atorvastatin (LIPITOR) 10 MG tablet Take 1 tablet by mouth daily.    . Calcium Carbonate-Vitamin D 600-400 MG-UNIT per tablet Take 2 tablets by mouth daily.    .  cetirizine (ZYRTEC) 5 MG chewable tablet Chew 5 mg by mouth daily.    Marland Kitchen docusate sodium (COLACE) 100 MG capsule Take 100 mg by mouth 2 (two) times daily.    . Fluticasone-Salmeterol (ADVAIR) 250-50 MCG/DOSE AEPB Inhale 1 puff into the lungs every 12 (twelve) hours.    . folic acid (FOLVITE) 1 MG tablet Take 1 mg by mouth daily.    . Lecithin 1200 MG CAPS Take 1,200 mg by mouth 3 (three) times daily.    Marland Kitchen loratadine (CLARITIN) 10 MG tablet Take 5 mg by mouth daily.     . methotrexate (RHEUMATREX) 2.5 MG tablet Take 2.5 mg by mouth once a week. Take 2 tabs QAM, Take 2 tabs QHS Every Tuesday. Caution:Chemotherapy. Protect from light.    . METHOTREXATE, ANTI-RHEUMATIC, PO Take 25 mg by mouth daily.    . Multiple Vitamins-Minerals (ABC PLUS SENIOR) TABS Take by mouth.    . Potassium Gluconate 595 MG CAPS Take 1 capsule by mouth daily.    . valsartan-hydrochlorothiazide (DIOVAN-HCT) 160-12.5 MG per tablet Take 1 tablet by mouth daily.     . vitamin B-12 (CYANOCOBALAMIN) 500 MCG tablet Take 2,500 mcg by mouth daily.     . Vitamins C E 500-400 MG-UNIT CAPS Take by mouth.     No current facility-administered medications for this visit.     REVIEW OF SYSTEMS: See HPI for pertinent positives and negatives.  Physical Examination Filed Vitals:   08/30/14 1107 08/30/14 1110  BP: 131/70 128/72  Pulse: 77 76  Resp:  16  Height:  5\' 4"  (1.626 m)  Weight:  170 lb (77.111 kg)  SpO2:  96%   Body mass index is 29.17 kg/(m^2).  General:  WDWN in NAD Gait: Normal HENT: WNL Eyes: Pupils equal Pulmonary: normal non-labored breathing , without Rales, rhonchi,  wheezing Cardiac: RRR, no murmur detected  Abdomen: soft, NT, no masses palpated Skin: no rashes, ulcers noted;  no Gangrene , no cellulitis; no open wounds.   VASCULAR EXAM  Carotid Bruits Right Left   Negative Negative                             VASCULAR EXAM: Extremities without ischemic changes  without Gangrene; without open  wounds.  LE Pulses Right Left       FEMORAL  1+ palpable  2+ palpable        POPLITEAL  not palpable   not palpable       POSTERIOR TIBIAL  not palpable   not palpable        DORSALIS PEDIS      ANTERIOR TIBIAL not palpable  not palpable     Musculoskeletal: no muscle wasting or atrophy; no peripheral edema  Neurologic: A&O X 3; Appropriate Affect ;  SENSATION: normal; MOTOR FUNCTION: 5/5 Symmetric, CN 2-12 intact Speech is fluent/normal     Non-Invasive Vascular Imaging (08/30/2014):   CEREBROVASCULAR DUPLEX EVALUATION    INDICATION: Carotid stenosis    PREVIOUS INTERVENTION(S): NA    DUPLEX EXAM:     RIGHT  LEFT  Peak Systolic Velocities (cm/s) End Diastolic Velocities (cm/s) Plaque LOCATION Peak Systolic Velocities (cm/s) End Diastolic Velocities (cm/s) Plaque  105 17  CCA PROXIMAL 102 20   78 13  CCA MID 100 20   70 18 HT CCA DISTAL 94 21 HT  121 15  ECA 96 13   118 36 HT ICA PROXIMAL 71 16 HT  60 15 HT ICA MID 55 10   59 17  ICA DISTAL 60 18     1.51 ICA / CCA Ratio (PSV) 0.71  Antegrade Vertebral Flow Antegrade  469 Brachial Systolic Pressure (mmHg) 629  Triphasic Brachial Artery Waveforms Triphasic    Plaque Morphology:  HM = Homogeneous, HT = Heterogeneous, CP = Calcific Plaque, SP = Smooth Plaque, IP = Irregular Plaque     ADDITIONAL FINDINGS:     IMPRESSION: Right internal carotid artery stenosis present in the less than 40% range, dense heterogeneous plaque with vessel tortuousity present making Doppler interrogation difficult and may be underestimating disease. Left internal carotid artery stenosis present of less than 40%.    Compared to the previous exam:  No previous carotid ultrasound to compare.     ASSESSMENT:  Casey Wilkinson is a 73 y.o. female who has known mild PAD and carotid artery stenosis. CT scan of the neck earlier in  2015 showed a 60% right internal carotid artery stenosis with minimal stenosis left internal carotid artery.  The patient denies any history of stroke or TIA symptoms. She states she does not describe claudication symptoms with walking, has no tissue loss. She denies any known heart problems.   PLAN:   Based on today's exam and non-invasive vascular lab results, the patient will follow up in 6 months with the following tests: carotid Duplex and ABI's. I discussed in depth with the patient the nature of atherosclerosis, and emphasized the importance of maximal medical management including strict control of blood pressure, blood glucose, and lipid levels, obtaining regular exercise, and cessation of smoking.  The patient is aware that without maximal medical management the underlying atherosclerotic disease process will progress, limiting the benefit of any interventions.  The patient was given information about stroke prevention and what symptoms should prompt the patient to seek immediate medical care.  The patient was given information about PAD including signs, symptoms, treatment, what symptoms should prompt the patient to seek immediate medical care, and risk reduction measures to take. Thank you for allowing [place] to participate in this patient's care.  Clemon Chambers, RN, MSN, FNP-C [facility] Office: [phone]  Clinic MD: Early 08/30/2014 11:26 AM

## 2014-08-30 NOTE — Addendum Note (Signed)
Addended by: Mena Goes on: 08/30/2014 01:56 PM   Modules accepted: Orders

## 2014-08-30 NOTE — Patient Instructions (Signed)

## 2014-09-01 ENCOUNTER — Ambulatory Visit: Payer: Medicare Other | Admitting: Family

## 2014-09-01 ENCOUNTER — Other Ambulatory Visit (HOSPITAL_COMMUNITY): Payer: Medicare Other

## 2014-09-28 DIAGNOSIS — L400 Psoriasis vulgaris: Secondary | ICD-10-CM | POA: Diagnosis not present

## 2014-09-28 DIAGNOSIS — C44519 Basal cell carcinoma of skin of other part of trunk: Secondary | ICD-10-CM | POA: Diagnosis not present

## 2014-09-28 DIAGNOSIS — D485 Neoplasm of uncertain behavior of skin: Secondary | ICD-10-CM | POA: Diagnosis not present

## 2014-09-28 DIAGNOSIS — Z79899 Other long term (current) drug therapy: Secondary | ICD-10-CM | POA: Diagnosis not present

## 2014-09-28 DIAGNOSIS — C44722 Squamous cell carcinoma of skin of right lower limb, including hip: Secondary | ICD-10-CM | POA: Diagnosis not present

## 2014-09-28 DIAGNOSIS — Z85828 Personal history of other malignant neoplasm of skin: Secondary | ICD-10-CM | POA: Diagnosis not present

## 2014-09-28 DIAGNOSIS — C44729 Squamous cell carcinoma of skin of left lower limb, including hip: Secondary | ICD-10-CM | POA: Diagnosis not present

## 2014-09-28 DIAGNOSIS — L570 Actinic keratosis: Secondary | ICD-10-CM | POA: Diagnosis not present

## 2014-10-17 DIAGNOSIS — Z008 Encounter for other general examination: Secondary | ICD-10-CM | POA: Diagnosis not present

## 2014-10-17 DIAGNOSIS — I10 Essential (primary) hypertension: Secondary | ICD-10-CM | POA: Diagnosis not present

## 2014-10-17 DIAGNOSIS — M859 Disorder of bone density and structure, unspecified: Secondary | ICD-10-CM | POA: Diagnosis not present

## 2014-10-17 DIAGNOSIS — I700 Atherosclerosis of aorta: Secondary | ICD-10-CM | POA: Diagnosis not present

## 2014-10-17 DIAGNOSIS — R8299 Other abnormal findings in urine: Secondary | ICD-10-CM | POA: Diagnosis not present

## 2014-10-17 DIAGNOSIS — D649 Anemia, unspecified: Secondary | ICD-10-CM | POA: Diagnosis not present

## 2014-10-24 DIAGNOSIS — Z0000 Encounter for general adult medical examination without abnormal findings: Secondary | ICD-10-CM | POA: Diagnosis not present

## 2014-10-24 DIAGNOSIS — I6529 Occlusion and stenosis of unspecified carotid artery: Secondary | ICD-10-CM | POA: Diagnosis not present

## 2014-10-24 DIAGNOSIS — G629 Polyneuropathy, unspecified: Secondary | ICD-10-CM | POA: Diagnosis not present

## 2014-10-24 DIAGNOSIS — E785 Hyperlipidemia, unspecified: Secondary | ICD-10-CM | POA: Diagnosis not present

## 2014-10-24 DIAGNOSIS — R05 Cough: Secondary | ICD-10-CM | POA: Diagnosis not present

## 2014-10-24 DIAGNOSIS — D649 Anemia, unspecified: Secondary | ICD-10-CM | POA: Diagnosis not present

## 2014-10-24 DIAGNOSIS — Z6828 Body mass index (BMI) 28.0-28.9, adult: Secondary | ICD-10-CM | POA: Diagnosis not present

## 2014-10-24 DIAGNOSIS — J449 Chronic obstructive pulmonary disease, unspecified: Secondary | ICD-10-CM | POA: Diagnosis not present

## 2014-10-24 DIAGNOSIS — L930 Discoid lupus erythematosus: Secondary | ICD-10-CM | POA: Diagnosis not present

## 2014-10-24 DIAGNOSIS — M3500 Sicca syndrome, unspecified: Secondary | ICD-10-CM | POA: Diagnosis not present

## 2014-10-24 DIAGNOSIS — I878 Other specified disorders of veins: Secondary | ICD-10-CM | POA: Diagnosis not present

## 2014-10-24 DIAGNOSIS — I700 Atherosclerosis of aorta: Secondary | ICD-10-CM | POA: Diagnosis not present

## 2014-10-25 DIAGNOSIS — Z1212 Encounter for screening for malignant neoplasm of rectum: Secondary | ICD-10-CM | POA: Diagnosis not present

## 2014-11-24 DIAGNOSIS — Z6828 Body mass index (BMI) 28.0-28.9, adult: Secondary | ICD-10-CM | POA: Diagnosis not present

## 2014-11-24 DIAGNOSIS — K1120 Sialoadenitis, unspecified: Secondary | ICD-10-CM | POA: Diagnosis not present

## 2014-11-24 DIAGNOSIS — M3500 Sicca syndrome, unspecified: Secondary | ICD-10-CM | POA: Diagnosis not present

## 2014-12-28 DIAGNOSIS — Z85828 Personal history of other malignant neoplasm of skin: Secondary | ICD-10-CM | POA: Diagnosis not present

## 2014-12-28 DIAGNOSIS — L400 Psoriasis vulgaris: Secondary | ICD-10-CM | POA: Diagnosis not present

## 2014-12-28 DIAGNOSIS — Z79899 Other long term (current) drug therapy: Secondary | ICD-10-CM | POA: Diagnosis not present

## 2014-12-28 DIAGNOSIS — L570 Actinic keratosis: Secondary | ICD-10-CM | POA: Diagnosis not present

## 2015-01-20 DIAGNOSIS — K529 Noninfective gastroenteritis and colitis, unspecified: Secondary | ICD-10-CM

## 2015-01-20 DIAGNOSIS — K5792 Diverticulitis of intestine, part unspecified, without perforation or abscess without bleeding: Secondary | ICD-10-CM

## 2015-01-20 HISTORY — DX: Diverticulitis of intestine, part unspecified, without perforation or abscess without bleeding: K57.92

## 2015-01-20 HISTORY — DX: Noninfective gastroenteritis and colitis, unspecified: K52.9

## 2015-02-06 ENCOUNTER — Observation Stay (HOSPITAL_COMMUNITY): Payer: Medicare Other

## 2015-02-06 ENCOUNTER — Inpatient Hospital Stay (HOSPITAL_COMMUNITY)
Admission: EM | Admit: 2015-02-06 | Discharge: 2015-02-12 | DRG: 393 | Disposition: A | Discharge status: Home / Self Care | Payer: Medicare Other | Attending: Internal Medicine | Admitting: Internal Medicine

## 2015-02-06 ENCOUNTER — Emergency Department (HOSPITAL_COMMUNITY): Payer: Medicare Other

## 2015-02-06 ENCOUNTER — Encounter (HOSPITAL_COMMUNITY): Payer: Self-pay | Admitting: Emergency Medicine

## 2015-02-06 DIAGNOSIS — I6523 Occlusion and stenosis of bilateral carotid arteries: Secondary | ICD-10-CM | POA: Diagnosis not present

## 2015-02-06 DIAGNOSIS — Z882 Allergy status to sulfonamides status: Secondary | ICD-10-CM

## 2015-02-06 DIAGNOSIS — J189 Pneumonia, unspecified organism: Secondary | ICD-10-CM | POA: Diagnosis not present

## 2015-02-06 DIAGNOSIS — L930 Discoid lupus erythematosus: Secondary | ICD-10-CM | POA: Diagnosis present

## 2015-02-06 DIAGNOSIS — K625 Hemorrhage of anus and rectum: Secondary | ICD-10-CM | POA: Diagnosis not present

## 2015-02-06 DIAGNOSIS — Z90710 Acquired absence of both cervix and uterus: Secondary | ICD-10-CM

## 2015-02-06 DIAGNOSIS — K55039 Acute (reversible) ischemia of large intestine, extent unspecified: Secondary | ICD-10-CM

## 2015-02-06 DIAGNOSIS — R1031 Right lower quadrant pain: Secondary | ICD-10-CM | POA: Diagnosis not present

## 2015-02-06 DIAGNOSIS — I739 Peripheral vascular disease, unspecified: Secondary | ICD-10-CM | POA: Diagnosis present

## 2015-02-06 DIAGNOSIS — R0602 Shortness of breath: Secondary | ICD-10-CM | POA: Diagnosis not present

## 2015-02-06 DIAGNOSIS — K921 Melena: Secondary | ICD-10-CM | POA: Diagnosis present

## 2015-02-06 DIAGNOSIS — K529 Noninfective gastroenteritis and colitis, unspecified: Secondary | ICD-10-CM | POA: Diagnosis not present

## 2015-02-06 DIAGNOSIS — D62 Acute posthemorrhagic anemia: Secondary | ICD-10-CM | POA: Diagnosis present

## 2015-02-06 DIAGNOSIS — R112 Nausea with vomiting, unspecified: Secondary | ICD-10-CM | POA: Insufficient documentation

## 2015-02-06 DIAGNOSIS — R1900 Intra-abdominal and pelvic swelling, mass and lump, unspecified site: Secondary | ICD-10-CM | POA: Diagnosis present

## 2015-02-06 DIAGNOSIS — K550 Acute vascular disorders of intestine: Principal | ICD-10-CM | POA: Diagnosis present

## 2015-02-06 DIAGNOSIS — J449 Chronic obstructive pulmonary disease, unspecified: Secondary | ICD-10-CM | POA: Diagnosis not present

## 2015-02-06 DIAGNOSIS — Z87891 Personal history of nicotine dependence: Secondary | ICD-10-CM

## 2015-02-06 DIAGNOSIS — R109 Unspecified abdominal pain: Secondary | ICD-10-CM | POA: Diagnosis present

## 2015-02-06 DIAGNOSIS — I10 Essential (primary) hypertension: Secondary | ICD-10-CM | POA: Diagnosis present

## 2015-02-06 DIAGNOSIS — N3289 Other specified disorders of bladder: Secondary | ICD-10-CM | POA: Diagnosis not present

## 2015-02-06 DIAGNOSIS — Z7982 Long term (current) use of aspirin: Secondary | ICD-10-CM

## 2015-02-06 DIAGNOSIS — R1032 Left lower quadrant pain: Secondary | ICD-10-CM | POA: Diagnosis not present

## 2015-02-06 DIAGNOSIS — Z881 Allergy status to other antibiotic agents status: Secondary | ICD-10-CM

## 2015-02-06 DIAGNOSIS — Z79899 Other long term (current) drug therapy: Secondary | ICD-10-CM

## 2015-02-06 DIAGNOSIS — Z888 Allergy status to other drugs, medicaments and biological substances status: Secondary | ICD-10-CM

## 2015-02-06 HISTORY — DX: Noninfective gastroenteritis and colitis, unspecified: K52.9

## 2015-02-06 LAB — COMPREHENSIVE METABOLIC PANEL
ALK PHOS: 165 U/L — AB (ref 39–117)
ALT: 21 U/L (ref 0–35)
AST: 32 U/L (ref 0–37)
Albumin: 3.3 g/dL — ABNORMAL LOW (ref 3.5–5.2)
Anion gap: 9 (ref 5–15)
BUN: 13 mg/dL (ref 6–23)
CO2: 26 mmol/L (ref 19–32)
Calcium: 9.3 mg/dL (ref 8.4–10.5)
Chloride: 102 mmol/L (ref 96–112)
Creatinine, Ser: 0.78 mg/dL (ref 0.50–1.10)
GFR, EST NON AFRICAN AMERICAN: 80 mL/min — AB (ref >90)
GLUCOSE: 101 mg/dL — AB (ref 70–99)
POTASSIUM: 3.7 mmol/L (ref 3.5–5.1)
Sodium: 137 mmol/L (ref 135–145)
Total Bilirubin: 0.7 mg/dL (ref 0.3–1.2)
Total Protein: 8.1 g/dL (ref 6.0–8.3)

## 2015-02-06 LAB — CBC WITH DIFFERENTIAL/PLATELET
BASOS PCT: 0 % (ref 0–1)
Basophils Absolute: 0 10*3/uL (ref 0.0–0.1)
EOS ABS: 0 10*3/uL (ref 0.0–0.7)
EOS PCT: 0 % (ref 0–5)
HCT: 32.6 % — ABNORMAL LOW (ref 36.0–46.0)
Hemoglobin: 10.6 g/dL — ABNORMAL LOW (ref 12.0–15.0)
LYMPHS ABS: 0.8 10*3/uL (ref 0.7–4.0)
Lymphocytes Relative: 9 % — ABNORMAL LOW (ref 12–46)
MCH: 31.5 pg (ref 26.0–34.0)
MCHC: 32.5 g/dL (ref 30.0–36.0)
MCV: 96.7 fL (ref 78.0–100.0)
MONO ABS: 0.4 10*3/uL (ref 0.1–1.0)
MONOS PCT: 4 % (ref 3–12)
Neutro Abs: 7.7 10*3/uL (ref 1.7–7.7)
Neutrophils Relative %: 87 % — ABNORMAL HIGH (ref 43–77)
Platelets: 243 10*3/uL (ref 150–400)
RBC: 3.37 MIL/uL — AB (ref 3.87–5.11)
RDW: 15.9 % — ABNORMAL HIGH (ref 11.5–15.5)
WBC: 8.8 10*3/uL (ref 4.0–10.5)

## 2015-02-06 LAB — APTT: aPTT: 28 seconds (ref 24–37)

## 2015-02-06 LAB — ABO/RH: ABO/RH(D): O POS

## 2015-02-06 LAB — PROTIME-INR
INR: 1.06 (ref 0.00–1.49)
Prothrombin Time: 14 seconds (ref 11.6–15.2)

## 2015-02-06 LAB — HEMOGLOBIN: Hemoglobin: 10 g/dL — ABNORMAL LOW (ref 12.0–15.0)

## 2015-02-06 MED ORDER — MORPHINE SULFATE 2 MG/ML IJ SOLN
1.0000 mg | Freq: Once | INTRAMUSCULAR | Status: AC
  Administered 2015-02-06: 1 mg via INTRAVENOUS
  Filled 2015-02-06: qty 1

## 2015-02-06 MED ORDER — CIPROFLOXACIN IN D5W 400 MG/200ML IV SOLN
400.0000 mg | Freq: Once | INTRAVENOUS | Status: AC
  Administered 2015-02-06: 400 mg via INTRAVENOUS
  Filled 2015-02-06: qty 200

## 2015-02-06 MED ORDER — METHOTREXATE 2.5 MG PO TABS: 5.0000 mg | ORAL_TABLET | ORAL | Status: DC

## 2015-02-06 MED ORDER — METRONIDAZOLE IN NACL 5-0.79 MG/ML-% IV SOLN
500.0000 mg | Freq: Once | INTRAVENOUS | Status: AC
  Administered 2015-02-06: 500 mg via INTRAVENOUS
  Filled 2015-02-06 (×2): qty 100

## 2015-02-06 MED ORDER — IOHEXOL 300 MG/ML  SOLN
25.0000 mL | Freq: Once | INTRAMUSCULAR | Status: AC | PRN
  Administered 2015-02-06: 25 mL via ORAL

## 2015-02-06 MED ORDER — SODIUM CHLORIDE 0.9 % IV SOLN
INTRAVENOUS | Status: DC
  Administered 2015-02-07 – 2015-02-08 (×2): via INTRAVENOUS
  Administered 2015-02-10: 50 mL/h via INTRAVENOUS

## 2015-02-06 MED ORDER — ALBUTEROL SULFATE HFA 108 (90 BASE) MCG/ACT IN AERS: 2.0000 | INHALATION_SPRAY | Freq: Four times a day (QID) | RESPIRATORY_TRACT | Status: DC | PRN

## 2015-02-06 MED ORDER — IOHEXOL 300 MG/ML  SOLN
100.0000 mL | Freq: Once | INTRAMUSCULAR | Status: AC | PRN
  Administered 2015-02-06: 100 mL via INTRAVENOUS

## 2015-02-06 MED ORDER — ALBUTEROL SULFATE (2.5 MG/3ML) 0.083% IN NEBU: 2.5000 mg | INHALATION_SOLUTION | Freq: Four times a day (QID) | RESPIRATORY_TRACT | Status: DC | PRN

## 2015-02-06 MED ORDER — SODIUM CHLORIDE 0.9 % IV SOLN
INTRAVENOUS | Status: DC
  Administered 2015-02-06: 18:00:00 via INTRAVENOUS

## 2015-02-06 MED ORDER — FOLIC ACID 1 MG PO TABS
1.0000 mg | ORAL_TABLET | Freq: Every day | ORAL | Status: DC
  Administered 2015-02-07 – 2015-02-12 (×6): 1 mg via ORAL
  Filled 2015-02-06 (×7): qty 1

## 2015-02-06 MED ORDER — METHOTREXATE 2.5 MG PO TABS
7.5000 mg | ORAL_TABLET | ORAL | Status: DC
  Administered 2015-02-07: 7.5 mg via ORAL
  Filled 2015-02-06: qty 3

## 2015-02-06 MED ORDER — MORPHINE SULFATE 2 MG/ML IJ SOLN
INTRAMUSCULAR | Status: AC
  Administered 2015-02-06: 2 mg via INTRAMUSCULAR
  Filled 2015-02-06: qty 1

## 2015-02-06 MED ORDER — MORPHINE SULFATE 2 MG/ML IJ SOLN
1.0000 mg | INTRAMUSCULAR | Status: DC | PRN
  Administered 2015-02-07 (×5): 1 mg via INTRAVENOUS
  Filled 2015-02-06 (×5): qty 1

## 2015-02-06 NOTE — ED Notes (Signed)
MD at bedside. 

## 2015-02-06 NOTE — ED Provider Notes (Signed)
CSN: [id number]     Arrival date & time 02/06/15  1052 History   First MD Initiated Contact with Patient 02/06/15 1204     Chief Complaint  Patient presents with  . Abdominal Pain  . GI Bleeding     (Consider location/radiation/quality/duration/timing/severity/associated sxs/prior Treatment) HPI Comments: Patient here with acute onset of black stools with accompanying bright red blood per rectum. Notes lower abdominal discomfort localized to her right and left lower quadrants. Denies any urinary symptoms. No fever noted. Denies any vomiting. Pain is constant and crampy and becomes worse prior to moving her bowels. Denies any recent antibiotics. No recent travel history. Symptoms persistent and nothing makes them better. No treatment use prior to arrival  Patient is a 74 y.o. female presenting with abdominal pain. The history is provided by the patient.  Abdominal Pain   Past Medical History  Diagnosis Date  . Discoid lupus erythematosus   . Hypertension   . COPD (chronic obstructive pulmonary disease)   . Anemia    Past Surgical History  Procedure Laterality Date  . Total vaginal hysterectomy  1968  . Hemmroids  2006  . Recocele/entracele  2006  . Abdominal hysterectomy  1968   Family History  Problem Relation Age of Onset  . Heart disease Father     Before age 89  . Hyperlipidemia Father   . Hypertension Father   . Pneumonia Mother   . Alzheimer's disease Mother    History  Substance Use Topics  . Smoking status: Former Smoker -- 0.50 packs/day for 50 years    Types: Cigarettes    Quit date: 12/05/2000  . Smokeless tobacco: Never Used     Comment: started smoking in early 20s  . Alcohol Use: No   OB History    No data available     Review of Systems  Gastrointestinal: Positive for abdominal pain.  All other systems reviewed and are negative.     Allergies  Prochlorperazine edisylate; Sulfur; and Doxycycline  Home Medications   Prior to Admission  medications   Medication Sig Start Date End Date Taking? Authorizing Provider  albuterol (PROVENTIL HFA;VENTOLIN HFA) 108 (90 BASE) MCG/ACT inhaler Inhale 2 puffs into the lungs every 6 (six) hours as needed for wheezing or shortness of breath.    Yes Historical Provider, MD  aspirin 81 MG tablet Take 81 mg by mouth daily.   Yes Historical Provider, MD  atorvastatin (LIPITOR) 10 MG tablet Take 1 tablet by mouth daily. 02/14/14  Yes Historical Provider, MD  Calcium Carbonate-Vitamin D 600-400 MG-UNIT per tablet Take 1 tablet by mouth daily.    Yes Historical Provider, MD  docusate sodium (COLACE) 100 MG capsule Take 100 mg by mouth daily.    Yes Historical Provider, MD  Fluticasone-Salmeterol (ADVAIR) 250-50 MCG/DOSE AEPB Inhale 1 puff into the lungs daily.    Yes Historical Provider, MD  folic acid (FOLVITE) 1 MG tablet Take 1 mg by mouth daily.   Yes Historical Provider, MD  Lecithin 1200 MG CAPS Take 1,200 mg by mouth 3 (three) times daily.   Yes Historical Provider, MD  Loratadine 5 MG/5ML SOLN Take 10 mLs by mouth at bedtime.   Yes Historical Provider, MD  methotrexate (RHEUMATREX) 2.5 MG tablet Take 5-7.5 mg by mouth 2 (two) times a week. Take 7.5 mg on Tuesdays and 5 mg on Wednesdays Caution:Chemotherapy. Protect from light.   Yes Historical Provider, MD  Multiple Vitamins-Minerals (ABC PLUS SENIOR) TABS Take by mouth.   Yes  Historical Provider, MD  Potassium Gluconate 595 MG CAPS Take 1 capsule by mouth daily.   Yes Historical Provider, MD  valsartan-hydrochlorothiazide (DIOVAN-HCT) 160-12.5 MG per tablet Take 1 tablet by mouth daily.  12/26/11  Yes Historical Provider, MD  vitamin B-12 (CYANOCOBALAMIN) 500 MCG tablet Take 2,500 mcg by mouth daily.    Yes Historical Provider, MD  Vitamins C E 500-400 MG-UNIT CAPS Take 1 tablet by mouth daily.    Yes Historical Provider, MD   BP 128/58 mmHg  Pulse 70  Temp(Src) 98.1 F (36.7 C) (Oral)  Resp 16  Ht 5\' 5"  (1.651 m)  Wt 168 lb (76.204 kg)   BMI 27.96 kg/m2  SpO2 98% Physical Exam  Constitutional: She is oriented to person, place, and time. She appears well-developed and well-nourished.  Non-toxic appearance. No distress.  HENT:  Head: Normocephalic and atraumatic.  Eyes: Conjunctivae, EOM and lids are normal. Pupils are equal, round, and reactive to light.  Neck: Normal range of motion. Neck supple. No tracheal deviation present. No thyroid mass present.  Cardiovascular: Normal rate, regular rhythm and normal heart sounds.  Exam reveals no gallop.   No murmur heard. Pulmonary/Chest: Effort normal and breath sounds normal. No stridor. No respiratory distress. She has no decreased breath sounds. She has no wheezes. She has no rhonchi. She has no rales.  Abdominal: Soft. Normal appearance and bowel sounds are normal. She exhibits no distension. There is tenderness in the right lower quadrant and left lower quadrant. There is no rigidity, no rebound, no guarding and no CVA tenderness.    Genitourinary: Rectal exam shows external hemorrhoid.  Gross blood noted  Musculoskeletal: Normal range of motion. She exhibits no edema or tenderness.  Neurological: She is alert and oriented to person, place, and time. She has normal strength. No cranial nerve deficit or sensory deficit. GCS eye subscore is 4. GCS verbal subscore is 5. GCS motor subscore is 6.  Skin: Skin is warm and dry. No abrasion and no rash noted.  Psychiatric: She has a normal mood and affect. Her speech is normal and behavior is normal.  Nursing note and vitals reviewed.   ED Course  Procedures (including critical care time) Labs Review Labs Reviewed  CBC WITH DIFFERENTIAL/PLATELET  COMPREHENSIVE METABOLIC PANEL  PROTIME-INR  APTT  TYPE AND SCREEN    Imaging Review No results found.   EKG Interpretation None      MDM   Final diagnoses:  Abdominal pain    Patient started on Cipro and Flagyl for colitis. She has no symptoms of shortness of breath or  fever or cough. Clinically she does not have pneumonia. Patient will be admitted to the hospital    Lacretia Leigh, MD 02/06/15 1527

## 2015-02-06 NOTE — ED Notes (Signed)
Pt reports onset of diarrhea last night around 8:15 pm.  Diarrhea subsided but noticed passing bright red blood and clots.

## 2015-02-06 NOTE — ED Notes (Signed)
Patient transported to CT 

## 2015-02-06 NOTE — H&P (Signed)
[facility] History and Physical  SHARDAI STAR YTK:[id number] DOB: 10/09/1941 DOA: 02/06/2015  Referring physician: Dr. Vivi Martens, EDP PCP: Precious Reel, MD  Specialists: none yet  74 y/o ? PAD, CA stenosis, SLE on MTX, COPD, anemia, Former Smoker and quit 2012 [smoker ~35 yrs] Was assisting decrepit husband Around 20:15 02/05/15 p.m. when noticed abdominal pain and discomfort. Abdominal pain is crampy intermittent constant 8/10 Associated with significant diarrhea Had dark stools subsequently around 2130 and then subsequently started passing frank blood No recent use of antibiotics or other issues. Felt dizzy with this and lightheaded on moving around Has had colonoscopy by [facility] gastroenterology~4 years ago which was according to the patient normal No chest pain associated  Does not take Goody powders or over-the-counter medications + Nausea - vomiting, - prior occurrence, - recent antibiotic use, - chest pain, -blurred vision - Unilateral weakness - fall, - dyspnea, - dysphagia, - chest pain, - joint pain  Emergency room workup revealed Hemoglobin 10.6 [no baseline] BUN/creatinine-13/0.7, alkaline phosphatase 165, albumin 3.3 CT abdomen pelvis = colitis distal transverse proximal colon, soft tissue density inferior to bladder? Soft tissue density posterior to the rectum? Stool  Right lower lobe infiltrate noted    Past Medical History  Diagnosis Date  . Discoid lupus erythematosus   . Hypertension   . COPD (chronic obstructive pulmonary disease)   . Anemia    Past Surgical History  Procedure Laterality Date  . Total vaginal hysterectomy  1968  . Hemmroids  2006  . Recocele/entracele  2006  . Abdominal hysterectomy  1968   Social History:  History   Social History Narrative    Allergies  Allergen Reactions  . Prochlorperazine Edisylate     **COMPAZINE**   Stroke-like symptoms  . Sulfur     Pt taking Methotrexate, Sulfur drugs could cause SEVERE fatal  reaction.   . Doxycycline     hives    Family History  Problem Relation Age of Onset  . Heart disease Father     Before age 27  . Hyperlipidemia Father   . Hypertension Father   . Pneumonia Mother   . Alzheimer's disease Mother     Prior to Admission medications   Medication Sig Start Date End Date Taking? Authorizing Provider  albuterol (PROVENTIL HFA;VENTOLIN HFA) 108 (90 BASE) MCG/ACT inhaler Inhale 2 puffs into the lungs every 6 (six) hours as needed for wheezing or shortness of breath.    Yes Historical Provider, MD  aspirin 81 MG tablet Take 81 mg by mouth daily.   Yes Historical Provider, MD  atorvastatin (LIPITOR) 10 MG tablet Take 1 tablet by mouth daily. 02/14/14  Yes Historical Provider, MD  Calcium Carbonate-Vitamin D 600-400 MG-UNIT per tablet Take 1 tablet by mouth daily.    Yes Historical Provider, MD  docusate sodium (COLACE) 100 MG capsule Take 100 mg by mouth daily.    Yes Historical Provider, MD  Fluticasone-Salmeterol (ADVAIR) 250-50 MCG/DOSE AEPB Inhale 1 puff into the lungs daily.    Yes Historical Provider, MD  folic acid (FOLVITE) 1 MG tablet Take 1 mg by mouth daily.   Yes Historical Provider, MD  Lecithin 1200 MG CAPS Take 1,200 mg by mouth 3 (three) times daily.   Yes Historical Provider, MD  Loratadine 5 MG/5ML SOLN Take 10 mLs by mouth at bedtime.   Yes Historical Provider, MD  methotrexate (RHEUMATREX) 2.5 MG tablet Take 5-7.5 mg by mouth 2 (two) times a week. Take 7.5 mg on Tuesdays and  5 mg on Wednesdays Caution:Chemotherapy. Protect from light.   Yes Historical Provider, MD  Multiple Vitamins-Minerals (ABC PLUS SENIOR) TABS Take by mouth.   Yes Historical Provider, MD  Potassium Gluconate 595 MG CAPS Take 1 capsule by mouth daily.   Yes Historical Provider, MD  valsartan-hydrochlorothiazide (DIOVAN-HCT) 160-12.5 MG per tablet Take 1 tablet by mouth daily.  12/26/11  Yes Historical Provider, MD  vitamin B-12 (CYANOCOBALAMIN) 500 MCG tablet Take 2,500 mcg by  mouth daily.    Yes Historical Provider, MD  Vitamins C E 500-400 MG-UNIT CAPS Take 1 tablet by mouth daily.    Yes Historical Provider, MD   Physical Exam: Filed Vitals:   02/06/15 1345 02/06/15 1445 02/06/15 1530 02/06/15 1556  BP: 143/59 165/76 147/58 131/61  Pulse: 75 75 75 87  Temp:      TempSrc:      Resp: 14 19 14 21   Height:      Weight:      SpO2: 99% 100% 96% 94%    EOMI pleasant nonicteric no pallor , no JVD, no bruit S1-S2 no murmur rub or gallop regular rate and rhythm,  Chest clinically clear no added sound no rales or fremitus Abdomen slightly tender in suprapubic region , no rebound nor guarding Cranial nerve exam intact with power 5/5 bilaterally Reflexes 2/3 No stigmata of lupus -thin skin however bilaterally   Labs on Admission:  Basic Metabolic Panel:  Recent Labs Lab 02/06/15 1204  NA 137  K 3.7  CL 102  CO2 26  GLUCOSE 101*  BUN 13  CREATININE 0.78  CALCIUM 9.3   Liver Function Tests:  Recent Labs Lab 02/06/15 1204  AST 32  ALT 21  ALKPHOS 165*  BILITOT 0.7  PROT 8.1  ALBUMIN 3.3*   No results for input(s): LIPASE, AMYLASE in the last 168 hours. No results for input(s): AMMONIA in the last 168 hours. CBC:  Recent Labs Lab 02/06/15 1204  WBC 8.8  NEUTROABS 7.7  HGB 10.6*  HCT 32.6*  MCV 96.7  PLT 243   Cardiac Enzymes: No results for input(s): CKTOTAL, CKMB, CKMBINDEX, TROPONINI in the last 168 hours.  BNP (last 3 results) No results for input(s): BNP in the last 8760 hours.  ProBNP (last 3 results) No results for input(s): PROBNP in the last 8760 hours.  CBG: No results for input(s): GLUCAP in the last 168 hours.  Radiological Exams on Admission: Ct Abdomen Pelvis W Contrast  02/06/2015   CLINICAL DATA:  Bright red blood per rectum with abdominal pain  EXAM: CT ABDOMEN AND PELVIS WITH CONTRAST  TECHNIQUE: Multidetector CT imaging of the abdomen and pelvis was performed using the standard protocol following bolus  administration of intravenous contrast.  CONTRAST:  146mL OMNIPAQUE IOHEXOL 300 MG/ML  SOLN  COMPARISON:  None.  FINDINGS: Lung bases are well aerated. Focal infiltrate is noted the medial aspect of the right lower lobe.  The liver, gallbladder, spleen, adrenal glands and pancreas are all normal in their CT appearance. The kidneys are well visualized bilaterally with a normal enhancement pattern. The distal transverse colon and proximal descending colon demonstrate wall thickening and edema with pericolonic inflammatory changes consistent with colitis. These changes may be related to diverticulitis although an ischemic etiology cannot be totally excluded. The origins of the mesenteric vessels appear patent.  The bladder is well distended. Soft tissue density is noted inferior to the bladder eccentric to the left measuring 3.0 x 3.9 cm. This extends to the inferior aspect  of the bladder but appears separate from the adjacent colon. Some soft tissue density is noted along the posterior wall of the rectum which may be related to fecal material. This is of uncertain significance. No significant lymphadenopathy is noted.  IMPRESSION: Changes consistent with colitis in the distal transverse and proximal descending colon. This may be related to diverticulitis although the possibility of an ischemic etiology would deserve consideration as well.  Soft tissue density inferior to the bladder and eccentric in the left pelvis as described. This is of uncertain etiology. Correlation with the physical exam is recommended.  Soft tissue density posteriorly in the rectum. Although this may represent fecal material possibility of an underlying mass would deserve consideration and correlation with physical exam is again recommended.  Right lower lobe infiltrate medially.   Electronically Signed   By: Inez Catalina M.D.   On: 02/06/2015 14:55    EKG: Independently reviewed. None performed   Assessment/Plan Principal Problem:    Colitis-risk factors for colitis = methotrexate for lupus, advanced age. Also patient is noted to take stimulant/purgative laxatives occasionally but did not take any night prior to admission. Continue Cipro/Flagyl-if continues to have diarrhea, consider C. difficile however at this stage i would not rule this out as she has no antibiotic exposure recently and is not on a PPI. I have held this patient's aspirin 81 mg daily for right now  Active Problems:   Essential hypertension-hold Diovan HCT for now. May be able to resume valsartan 160 in a.m. if blood pressures are up    LUPUS ERYTHEMATOSUS, DISCOID-hold methotrexate given infectious process. Can resume once the infection clears   Peripheral vascular disease-outpatient follow-up. Recommend high intensity dosing of statin from 10 mg-->80 mg given at least 2 indications for the same. Outpatient follow-up with Dr. Virgina Jock regarding general monitoring of this   Abdominal mass-unclear what to make of this mass. We'll see if patient needs further imaging or if this issue does not resolve   COPD mild, former smoker. Continue albuterol 2 puffs every 6 when necessary   Time spent: [place], [facility] Pager [phone]  If 7PM-7AM, please contact night-coverage [email or web address] Password [email or web address] 02/06/2015, 4:10 PM

## 2015-02-07 ENCOUNTER — Encounter: Payer: Self-pay | Admitting: Internal Medicine

## 2015-02-07 DIAGNOSIS — R109 Unspecified abdominal pain: Secondary | ICD-10-CM | POA: Diagnosis present

## 2015-02-07 DIAGNOSIS — Z7982 Long term (current) use of aspirin: Secondary | ICD-10-CM | POA: Diagnosis not present

## 2015-02-07 DIAGNOSIS — I10 Essential (primary) hypertension: Secondary | ICD-10-CM | POA: Diagnosis not present

## 2015-02-07 DIAGNOSIS — R197 Diarrhea, unspecified: Secondary | ICD-10-CM | POA: Diagnosis not present

## 2015-02-07 DIAGNOSIS — J449 Chronic obstructive pulmonary disease, unspecified: Secondary | ICD-10-CM | POA: Diagnosis present

## 2015-02-07 DIAGNOSIS — I6523 Occlusion and stenosis of bilateral carotid arteries: Secondary | ICD-10-CM | POA: Diagnosis present

## 2015-02-07 DIAGNOSIS — R1032 Left lower quadrant pain: Secondary | ICD-10-CM

## 2015-02-07 DIAGNOSIS — K529 Noninfective gastroenteritis and colitis, unspecified: Secondary | ICD-10-CM | POA: Diagnosis not present

## 2015-02-07 DIAGNOSIS — R1030 Lower abdominal pain, unspecified: Secondary | ICD-10-CM | POA: Diagnosis not present

## 2015-02-07 DIAGNOSIS — Z79899 Other long term (current) drug therapy: Secondary | ICD-10-CM | POA: Diagnosis not present

## 2015-02-07 DIAGNOSIS — R1110 Vomiting, unspecified: Secondary | ICD-10-CM | POA: Diagnosis not present

## 2015-02-07 DIAGNOSIS — R1084 Generalized abdominal pain: Secondary | ICD-10-CM | POA: Diagnosis not present

## 2015-02-07 DIAGNOSIS — J189 Pneumonia, unspecified organism: Secondary | ICD-10-CM | POA: Diagnosis not present

## 2015-02-07 DIAGNOSIS — K921 Melena: Secondary | ICD-10-CM | POA: Diagnosis present

## 2015-02-07 DIAGNOSIS — R112 Nausea with vomiting, unspecified: Secondary | ICD-10-CM | POA: Diagnosis not present

## 2015-02-07 DIAGNOSIS — K550 Acute vascular disorders of intestine: Secondary | ICD-10-CM | POA: Diagnosis not present

## 2015-02-07 DIAGNOSIS — R1900 Intra-abdominal and pelvic swelling, mass and lump, unspecified site: Secondary | ICD-10-CM | POA: Diagnosis present

## 2015-02-07 DIAGNOSIS — Z882 Allergy status to sulfonamides status: Secondary | ICD-10-CM | POA: Diagnosis not present

## 2015-02-07 DIAGNOSIS — Z888 Allergy status to other drugs, medicaments and biological substances status: Secondary | ICD-10-CM | POA: Diagnosis not present

## 2015-02-07 DIAGNOSIS — I739 Peripheral vascular disease, unspecified: Secondary | ICD-10-CM | POA: Diagnosis present

## 2015-02-07 DIAGNOSIS — L930 Discoid lupus erythematosus: Secondary | ICD-10-CM | POA: Diagnosis not present

## 2015-02-07 DIAGNOSIS — K5720 Diverticulitis of large intestine with perforation and abscess without bleeding: Secondary | ICD-10-CM | POA: Diagnosis not present

## 2015-02-07 DIAGNOSIS — Z90710 Acquired absence of both cervix and uterus: Secondary | ICD-10-CM | POA: Diagnosis not present

## 2015-02-07 DIAGNOSIS — Z87891 Personal history of nicotine dependence: Secondary | ICD-10-CM | POA: Diagnosis not present

## 2015-02-07 DIAGNOSIS — D62 Acute posthemorrhagic anemia: Secondary | ICD-10-CM | POA: Diagnosis present

## 2015-02-07 DIAGNOSIS — K625 Hemorrhage of anus and rectum: Secondary | ICD-10-CM | POA: Diagnosis not present

## 2015-02-07 DIAGNOSIS — Z881 Allergy status to other antibiotic agents status: Secondary | ICD-10-CM | POA: Diagnosis not present

## 2015-02-07 LAB — CBC
HCT: 29.4 % — ABNORMAL LOW (ref 36.0–46.0)
HEMOGLOBIN: 9.6 g/dL — AB (ref 12.0–15.0)
MCH: 31.2 pg (ref 26.0–34.0)
MCHC: 32.7 g/dL (ref 30.0–36.0)
MCV: 95.5 fL (ref 78.0–100.0)
PLATELETS: 207 10*3/uL (ref 150–400)
RBC: 3.08 MIL/uL — ABNORMAL LOW (ref 3.87–5.11)
RDW: 16.1 % — ABNORMAL HIGH (ref 11.5–15.5)
WBC: 7.7 10*3/uL (ref 4.0–10.5)

## 2015-02-07 LAB — HEMOGLOBIN
HEMOGLOBIN: 8.7 g/dL — AB (ref 12.0–15.0)
Hemoglobin: 9.5 g/dL — ABNORMAL LOW (ref 12.0–15.0)

## 2015-02-07 LAB — PROTIME-INR
INR: 1.15 (ref 0.00–1.49)
Prothrombin Time: 14.8 seconds (ref 11.6–15.2)

## 2015-02-07 LAB — TYPE AND SCREEN
ABO/RH(D): O POS
ANTIBODY SCREEN: NEGATIVE

## 2015-02-07 LAB — CLOSTRIDIUM DIFFICILE BY PCR: Toxigenic C. Difficile by PCR: NEGATIVE

## 2015-02-07 MED ORDER — HYOSCYAMINE SULFATE 0.125 MG SL SUBL
0.2500 mg | SUBLINGUAL_TABLET | SUBLINGUAL | Status: DC | PRN
  Administered 2015-02-07 – 2015-02-10 (×12): 0.25 mg via SUBLINGUAL
  Filled 2015-02-07 (×17): qty 2

## 2015-02-07 MED ORDER — MORPHINE SULFATE 2 MG/ML IJ SOLN
1.0000 mg | INTRAMUSCULAR | Status: AC
  Administered 2015-02-07: 1 mg via INTRAVENOUS
  Filled 2015-02-07: qty 1

## 2015-02-07 MED ORDER — METRONIDAZOLE IN NACL 5-0.79 MG/ML-% IV SOLN
500.0000 mg | Freq: Three times a day (TID) | INTRAVENOUS | Status: DC
  Administered 2015-02-07: 500 mg via INTRAVENOUS
  Filled 2015-02-07 (×3): qty 100

## 2015-02-07 MED ORDER — SODIUM CHLORIDE 0.9 % IV SOLN
Freq: Once | INTRAVENOUS | Status: AC
  Administered 2015-02-07: 12:00:00 via INTRAVENOUS

## 2015-02-07 MED ORDER — MORPHINE SULFATE 2 MG/ML IJ SOLN
2.0000 mg | INTRAMUSCULAR | Status: DC | PRN
  Administered 2015-02-07 – 2015-02-11 (×14): 2 mg via INTRAVENOUS
  Filled 2015-02-07 (×14): qty 1

## 2015-02-07 MED ORDER — PIPERACILLIN-TAZOBACTAM 3.375 G IVPB
3.3750 g | Freq: Three times a day (TID) | INTRAVENOUS | Status: DC
  Administered 2015-02-07 – 2015-02-10 (×9): 3.375 g via INTRAVENOUS
  Filled 2015-02-07 (×13): qty 50

## 2015-02-07 MED ORDER — CIPROFLOXACIN IN D5W 400 MG/200ML IV SOLN
400.0000 mg | Freq: Two times a day (BID) | INTRAVENOUS | Status: DC
  Administered 2015-02-07: 400 mg via INTRAVENOUS
  Filled 2015-02-07 (×2): qty 200

## 2015-02-07 NOTE — Progress Notes (Signed)
UR completed 

## 2015-02-07 NOTE — Progress Notes (Signed)
ANTIBIOTIC CONSULT NOTE - INITIAL  Pharmacy Consult:  Zosyn Indication:  HCAP  Allergies  Allergen Reactions  . Prochlorperazine Edisylate     **COMPAZINE**   Stroke-like symptoms  . Sulfur     Pt taking Methotrexate, Sulfur drugs could cause SEVERE fatal reaction.   . Doxycycline     hives    Patient Measurements: Height: '5\' 5"'$  (165.1 cm) Weight: 168 lb (76.204 kg) IBW/kg (Calculated) : 57  Vital Signs: Temp: 98.5 F (36.9 C) (04/19 1005) Temp Source: Oral (04/19 1005) BP: 129/60 mmHg (04/19 1005) Pulse Rate: 78 (04/19 1005)  Labs:  Recent Labs  02/06/15 1204  02/06/15 2356 02/07/15 0439 02/07/15 0848  WBC 8.8  --   --  7.7  --   HGB 10.6*  < > 9.5* 9.6* 8.7*  PLT 243  --   --  207  --   CREATININE 0.78  --   --   --   --   < > = values in this interval not displayed. Estimated Creatinine Clearance: 62.1 mL/min (by C-G formula based on Cr of 0.78). No results for input(s): VANCOTROUGH, VANCOPEAK, VANCORANDOM, GENTTROUGH, GENTPEAK, GENTRANDOM, TOBRATROUGH, TOBRAPEAK, TOBRARND, AMIKACINPEAK, AMIKACINTROU, AMIKACIN in the last 72 hours.   Microbiology: No results found for this or any previous visit (from the past 720 hour(s)).  Medical History: Past Medical History  Diagnosis Date  . Discoid lupus erythematosus   . Hypertension   . COPD (chronic obstructive pulmonary disease)   . Anemia   . Colitis      Assessment: 59 YOF presented on 02/06/15 with abdominal pain and discomfort.  Patient was started on empiric Cipro and Flagyl.  Found to have PNA and antibiotics to switch to Zosyn to cover for both the lungs and abdomen.  Baseline labs reviewed.  Cipro 4/18 >> 4/19 Flagyl 4/18 >> 4/19 Zosyn 4/19 >>   Goal of Therapy:  Resolution of infection   Plan:  - Zosyn 3.375gm IV Q8H, 4 hr infusion - D/C Flagyl per discussion with MD - Pharmacy will sign off as dosage adjustment likely unnecessary.  Thank you for the consult!    Pinkie Manger D. Mina Marble, PharmD,  BCPS Pager:  [phone] 02/07/2015, 11:21 AM

## 2015-02-07 NOTE — Consult Note (Signed)
Referring Provider: [facility] Primary Care Physician:  Precious Reel, MD Primary Gastroenterologist:  Dr. Henrene Pastor  Reason for Consultation: GI bleed    HPI: Casey Wilkinson is a 74 y.o. female with a history of PAD and carotid artery stenosis. CT of the neck in 2015 showed a 60% right internal carotid artery artery stenosis with minimal stenosis on the left. She also has a history of COPD, anemia, SLE on methotrexate. She is a former smoker who quit in 2012. On Sunday the 17th after having a large group of company for dinner, she began to experience abdominal cramping and pain with nausea. She had 2 or 3 dark stools and several hours later began to pass burgundy stool with clots. She felt dizzy while moving around but had no chest pain or shortness of breath. She denies use of NSAIDs or Goody powders. She denies use of alcohol. She denies history of ulcers. On further questioning she states her last colonoscopy was by Dr. Henrene Pastor. This was done on 08/26/2006 which time she was noted to have diverticulosis, internal and external hemorrhoids, no polyps. She was advised to have surveillance in 10 years. She states that for years prior to that colonoscopy she had been seen at [facility] gastroenterology for another problem but has not seen them since. Since admission she has had several urges to move her bowels and each time past dark clots. This morning she states there was blood dripping on the floor. Hemoglobin this morning was 8.7, down from 10.6 on admission. No baseline is known. INR is 1.15.She continues to have diffuse, crampy abd pain. Prior to admission patient's bowel movements were fairly regular however she did intermittently need to use laxatives.   Past Medical History  Diagnosis Date  . Discoid lupus erythematosus   . Hypertension   . COPD (chronic obstructive pulmonary disease)   . Anemia   . Colitis     Past Surgical History  Procedure Laterality Date  . Total vaginal  hysterectomy  1968  . Hemmroids  2006  . Recocele/entracele  2006  . Abdominal hysterectomy  1968    Prior to Admission medications   Medication Sig Start Date End Date Taking? Authorizing Provider  albuterol (PROVENTIL HFA;VENTOLIN HFA) 108 (90 BASE) MCG/ACT inhaler Inhale 2 puffs into the lungs every 6 (six) hours as needed for wheezing or shortness of breath.    Yes Historical Provider, MD  aspirin 81 MG tablet Take 81 mg by mouth daily.   Yes Historical Provider, MD  atorvastatin (LIPITOR) 10 MG tablet Take 1 tablet by mouth daily. 02/14/14  Yes Historical Provider, MD  Calcium Carbonate-Vitamin D 600-400 MG-UNIT per tablet Take 1 tablet by mouth daily.    Yes Historical Provider, MD  docusate sodium (COLACE) 100 MG capsule Take 100 mg by mouth daily.    Yes Historical Provider, MD  Fluticasone-Salmeterol (ADVAIR) 250-50 MCG/DOSE AEPB Inhale 1 puff into the lungs daily.    Yes Historical Provider, MD  folic acid (FOLVITE) 1 MG tablet Take 1 mg by mouth daily.   Yes Historical Provider, MD  Lecithin 1200 MG CAPS Take 1,200 mg by mouth 3 (three) times daily.   Yes Historical Provider, MD  Loratadine 5 MG/5ML SOLN Take 10 mLs by mouth at bedtime.   Yes Historical Provider, MD  methotrexate (RHEUMATREX) 2.5 MG tablet Take 5-7.5 mg by mouth 2 (two) times a week. Take 7.5 mg on Tuesdays and 5 mg on Wednesdays Caution:Chemotherapy. Protect from light.   Yes  Historical Provider, MD  Multiple Vitamins-Minerals (ABC PLUS SENIOR) TABS Take by mouth.   Yes Historical Provider, MD  Potassium Gluconate 595 MG CAPS Take 1 capsule by mouth daily.   Yes Historical Provider, MD  valsartan-hydrochlorothiazide (DIOVAN-HCT) 160-12.5 MG per tablet Take 1 tablet by mouth daily.  12/26/11  Yes Historical Provider, MD  vitamin B-12 (CYANOCOBALAMIN) 500 MCG tablet Take 2,500 mcg by mouth daily.    Yes Historical Provider, MD  Vitamins C E 500-400 MG-UNIT CAPS Take 1 tablet by mouth daily.    Yes Historical Provider,  MD    Current Facility-Administered Medications  Medication Dose Route Frequency Provider Last Rate Last Dose  . 0.9 %  sodium chloride infusion   Intravenous Continuous Reyne Dumas, MD   0  at 02/06/15 1748  . albuterol (PROVENTIL) (2.5 MG/3ML) 0.083% nebulizer solution 2.5 mg  2.5 mg Nebulization Q6H PRN Nita Sells, MD      . folic acid (FOLVITE) tablet 1 mg  1 mg Oral Daily Nita Sells, MD   1 mg at 02/07/15 0959  . morphine 2 MG/ML injection 1 mg  1 mg Intravenous Q4H PRN Gardiner Barefoot, NP   1 mg at 02/07/15 1155  . piperacillin-tazobactam (ZOSYN) IVPB 3.375 g  3.375 g Intravenous 3 times per day Thuy D Dang, RPH   3.375 g at 02/07/15 1212    Allergies as of 02/06/2015 - Review Complete 02/06/2015  Allergen Reaction Noted  . Prochlorperazine edisylate  12/08/2008  . Sulfur  01/03/2012  . Doxycycline  01/03/2012    Family History  Problem Relation Age of Onset  . Heart disease Father     Before age 37  . Hyperlipidemia Father   . Hypertension Father   . Pneumonia Mother   . Alzheimer's disease Mother     History   Social History  . Marital Status: Married    Spouse Name: N/A  . Number of Children: 2  . Years of Education: N/A   Occupational History  . retired     Secretary/receptionist [phone]   Social History Main Topics  . Smoking status: Former Smoker -- 0.50 packs/day for 50 years    Types: Cigarettes    Quit date: 12/05/2000  . Smokeless tobacco: Never Used     Comment: started smoking in early 20s  . Alcohol Use: No  . Drug Use: No  . Sexual Activity: Not on file   Other Topics Concern  . Not on file   Social History Narrative    Review of Systems: Gen: Denies any fever, chills, sweats, anorexia, fatigue, weakness, malaise, weight loss, and sleep disorder CV: Denies chest pain, angina, palpitations, syncope, orthopnea, PND, peripheral edema, and claudication. Resp: Has COPD and cough GI: Denies vomiting blood, jaundice, and  fecal incontinence.   Denies dysphagia or odynophagia. GU : Denies urinary burning, blood in urine, urinary frequency, urinary hesitancy, nocturnal urination, and urinary incontinence. MS: Denies  limitation of movement, and swelling, stiffness, low back pain, extremity pain. Denies muscle weakness, cramps, atrophy.  Derm: Denies rash, itching, dry skin, hives, moles, warts, or unhealing ulcers.  Psych: Denies depression, anxiety, memory loss, suicidal ideation, hallucinations, paranoia, and confusion. Heme: Denies bruising, and enlarged lymph nodes. Neuro:  Denies any headaches, paresthesias.   Physical Exam: Vital signs in last 24 hours: Temp:  [97.5 F (36.4 C)-98.7 F (37.1 C)] 98 F (36.7 C) (04/19 1149) Pulse Rate:  [67-87] 74 (04/19 1131) Resp:  [14-24] 16 (04/19 1149) BP: (105-165)/(43-76) 113/46  mmHg (04/19 1149) SpO2:  [92 %-100 %] 96 % (04/19 1149) Last BM Date: 02/05/15 General:   Alert,  Well-developed, well-nourished, pleasant and cooperative in NAD Head:  Normocephalic and atraumatic. Eyes:  Sclera clear, no icterus. Conjunctiva pink. Ears:  Normal auditory acuity. Nose:  No deformity, discharge,  or lesions. Mouth:  No deformity or lesions.   Neck:  Supple; no masses or thyromegaly. Lungs:  Clear throughout to auscultation.     Heart:  Regular rate and rhythm; no murmurs Abdomen:  Soft,nontender, BS active,nonpalp mass or hsm.   Rectal:  Small dark burgundy clots Msk:  Symmetrical without gross deformities. . Pulses:  Normal pulses noted. Extremities: Without clubbing or edema. Neurologic: Alert and  oriented x4;  grossly normal neurologically. Skin: Intact without significant lesions or rashes.. Psych:  Alert and cooperative. Normal mood and affect.  Intake/Output from previous day:   Intake/Output this shift:    Lab Results:  Recent Labs  02/06/15 1204  02/06/15 2356 02/07/15 0439 02/07/15 0848  WBC 8.8  --   --  7.7  --   HGB 10.6*  < > 9.5* 9.6*  8.7*  HCT 32.6*  --   --  29.4*  --   PLT 243  --   --  207  --   < > = values in this interval not displayed. BMET  Recent Labs  02/06/15 1204  NA 137  K 3.7  CL 102  CO2 26  GLUCOSE 101*  BUN 13  CREATININE 0.78  CALCIUM 9.3   LFT  Recent Labs  02/06/15 1204  PROT 8.1  ALBUMIN 3.3*  AST 32  ALT 21  ALKPHOS 165*  BILITOT 0.7   PT/INR  Recent Labs  02/06/15 1204 02/07/15 0439  LABPROT 14.0 14.8  INR 1.06 1.15    Studies/Results: Dg Chest 2 View  02/06/2015   CLINICAL DATA:  Chronic shortness of breath.  COPD.  EXAM: CHEST  2 VIEW  COMPARISON:  07/13/2013 and previous  FINDINGS: There is emphysema with lucency in the upper lobes in crowding of markings in the lower lobes. No sign of active infiltrate, lobar collapse or effusion. No bony abnormality. Heart size is normal. No pulmonary edema.  IMPRESSION: Advanced chronic emphysema. Crowding of the lung markings at the bases related to that. No acute process visible.   Electronically Signed   By: Nelson Chimes M.D.   On: 02/06/2015 18:18   Ct Abdomen Pelvis W Contrast  02/06/2015   CLINICAL DATA:  Bright red blood per rectum with abdominal pain  EXAM: CT ABDOMEN AND PELVIS WITH CONTRAST  TECHNIQUE: Multidetector CT imaging of the abdomen and pelvis was performed using the standard protocol following bolus administration of intravenous contrast.  CONTRAST:  171m OMNIPAQUE IOHEXOL 300 MG/ML  SOLN  COMPARISON:  None.  FINDINGS: Lung bases are well aerated. Focal infiltrate is noted the medial aspect of the right lower lobe.  The liver, gallbladder, spleen, adrenal glands and pancreas are all normal in their CT appearance. The kidneys are well visualized bilaterally with a normal enhancement pattern. The distal transverse colon and proximal descending colon demonstrate wall thickening and edema with pericolonic inflammatory changes consistent with colitis. These changes may be related to diverticulitis although an ischemic  etiology cannot be totally excluded. The origins of the mesenteric vessels appear patent.  The bladder is well distended. Soft tissue density is noted inferior to the bladder eccentric to the left measuring 3.0 x 3.9 cm. This extends to  the inferior aspect of the bladder but appears separate from the adjacent colon. Some soft tissue density is noted along the posterior wall of the rectum which may be related to fecal material. This is of uncertain significance. No significant lymphadenopathy is noted.  IMPRESSION: Changes consistent with colitis in the distal transverse and proximal descending colon. This may be related to diverticulitis although the possibility of an ischemic etiology would deserve consideration as well.  Soft tissue density inferior to the bladder and eccentric in the left pelvis as described. This is of uncertain etiology. Correlation with the physical exam is recommended.  Soft tissue density posteriorly in the rectum. Although this may represent fecal material possibility of an underlying mass would deserve consideration and correlation with physical exam is again recommended.  Right lower lobe infiltrate medially.   Electronically Signed   By: Inez Catalina M.D.   On: 02/06/2015 14:55    IMPRESSION/PLAN:  74 year old female admitted with GI bleed, CT findings of colitis question ischemic versus diverticulitis. Patient currently on Cipro and Flagyl. Her white blood count is normal. Hemoglobin has dropped 2 points since admission patient is currently receiving a unit of packed cells. Continues to have passage of some burgundy clots today. Continue bowel rest, antibiotics, IVF. Will review with Dr Hilarie Fredrickson re ? Need for luminal eval, although this would likely not be imminent (let inflammation calm down). Hold ASA.   Dayvon Dax, Deloris Ping 02/07/2015,  Pager [phone]

## 2015-02-07 NOTE — Progress Notes (Addendum)
[facility] PROGRESS NOTE  Casey Wilkinson BWI:[id number] DOB: 10/31/1940 DOA: 02/06/2015 PCP: Precious Reel, MD  Assessment/Plan: Principal Problem:   Colitis Active Problems:   Essential hypertension   LUPUS ERYTHEMATOSUS, DISCOID   Peripheral vascular disease   Discoid lupus erythematosus   Abdominal mass   HTN (hypertension)   Rectal bleeding secondary to infectious colitis versus diverticular bleeding with diverticulitis versus ischemic colitis Gastroenterology consulted-[facility], patient may need a colonoscopy on ciprofloxacin and Flagyl, DC cipro and switch to zosyn as pt also has a RLL pna on CT  Hemoglobin stable Continue to hold aspirin Place on telemetry  Essential hypertension Continue to hold Diovan and HCTZ  History of discoid lupus Hold methotrexate  Peripheral vascular disease Followed by  Dr. Oneida Alar  Acute blood loss anemia Follow CBC and transfuse 1 unit to stay ahead   COPD, continue albuterol inhaler as needed  Code Status: full Family Communication: family updated about patient's clinical progress Disposition Plan:  As above    Brief narrative: 74 y/o ? PAD, CA stenosis, SLE on MTX, COPD, anemia, Former Smoker and quit 2012 [smoker ~35 yrs] Was assisting decrepit husband Around 20:15 02/05/15 p.m. when noticed abdominal pain and discomfort. Abdominal pain is crampy intermittent constant 8/10 Associated with significant diarrhea Had dark stools subsequently around 2130 and then subsequently started passing frank blood No recent use of antibiotics or other issues. Felt dizzy with this and lightheaded on moving around Has had colonoscopy by [facility] gastroenterology~4 years ago which was according to the patient normal No chest pain associated  Does not take Goody powders or over-the-counter medications + Nausea - vomiting, - prior occurrence, - recent antibiotic use, - chest pain, -blurred vision - Unilateral weakness - fall, -  dyspnea, - dysphagia, - chest pain, - joint pain  Emergency room workup revealed Hemoglobin 10.6 [no baseline] BUN/creatinine-13/0.7, alkaline phosphatase 165, albumin 3.3 CT abdomen pelvis = colitis distal transverse proximal colon, soft tissue density inferior to bladder? Soft tissue density posterior to the rectum? Stool    Consultants:  GI  Procedures:  None   Antibiotics: Cipro/flagyl>zosyn /flagyl  HPI/Subjective: Bloody stool since yesterday  Objective: Filed Vitals:   02/06/15 1615 02/06/15 2140 02/07/15 0507 02/07/15 1005  BP: 137/57 120/48 105/43 129/60  Pulse: 76 77 67 78  Temp:  98.7 F (37.1 C) 98.1 F (36.7 C) 98.5 F (36.9 C)  TempSrc:  Oral Oral Oral  Resp: '15 16 16 16  '$ Height:      Weight:      SpO2: 95% 94% 94% 97%   No intake or output data in the 24 hours ending 02/07/15 1025  Exam:  General: No acute respiratory distress Lungs: Clear to auscultation bilaterally without wheezes or crackles Cardiovascular: Regular rate and rhythm without murmur gallop or rub normal S1 and S2 Abdomen: Nontender, nondistended, soft, bowel sounds positive, no rebound, no ascites, no appreciable mass Extremities: No significant cyanosis, clubbing, or edema bilateral lower extremities      Data Reviewed: Basic Metabolic Panel:  Recent Labs Lab 02/06/15 1204  NA 137  K 3.7  CL 102  CO2 26  GLUCOSE 101*  BUN 13  CREATININE 0.78  CALCIUM 9.3    Liver Function Tests:  Recent Labs Lab 02/06/15 1204  AST 32  ALT 21  ALKPHOS 165*  BILITOT 0.7  PROT 8.1  ALBUMIN 3.3*   No results for input(s): LIPASE, AMYLASE in the last 168 hours. No results for input(s): AMMONIA in the last 168  hours.  CBC:  Recent Labs Lab 02/06/15 1204 02/06/15 1613 02/06/15 2356 02/07/15 0439 02/07/15 0848  WBC 8.8  --   --  7.7  --   NEUTROABS 7.7  --   --   --   --   HGB 10.6* 10.0* 9.5* 9.6* 8.7*  HCT 32.6*  --   --  29.4*  --   MCV 96.7  --   --  95.5  --    PLT 243  --   --  207  --     Cardiac Enzymes: No results for input(s): CKTOTAL, CKMB, CKMBINDEX, TROPONINI in the last 168 hours. BNP (last 3 results) No results for input(s): BNP in the last 8760 hours.  ProBNP (last 3 results) No results for input(s): PROBNP in the last 8760 hours.    CBG: No results for input(s): GLUCAP in the last 168 hours.  No results found for this or any previous visit (from the past 240 hour(s)).   Studies: Dg Chest 2 View  02/06/2015   CLINICAL DATA:  Chronic shortness of breath.  COPD.  EXAM: CHEST  2 VIEW  COMPARISON:  07/13/2013 and previous  FINDINGS: There is emphysema with lucency in the upper lobes in crowding of markings in the lower lobes. No sign of active infiltrate, lobar collapse or effusion. No bony abnormality. Heart size is normal. No pulmonary edema.  IMPRESSION: Advanced chronic emphysema. Crowding of the lung markings at the bases related to that. No acute process visible.   Electronically Signed   By: Nelson Chimes M.D.   On: 02/06/2015 18:18   Ct Abdomen Pelvis W Contrast  02/06/2015   CLINICAL DATA:  Bright red blood per rectum with abdominal pain  EXAM: CT ABDOMEN AND PELVIS WITH CONTRAST  TECHNIQUE: Multidetector CT imaging of the abdomen and pelvis was performed using the standard protocol following bolus administration of intravenous contrast.  CONTRAST:  154m OMNIPAQUE IOHEXOL 300 MG/ML  SOLN  COMPARISON:  None.  FINDINGS: Lung bases are well aerated. Focal infiltrate is noted the medial aspect of the right lower lobe.  The liver, gallbladder, spleen, adrenal glands and pancreas are all normal in their CT appearance. The kidneys are well visualized bilaterally with a normal enhancement pattern. The distal transverse colon and proximal descending colon demonstrate wall thickening and edema with pericolonic inflammatory changes consistent with colitis. These changes may be related to diverticulitis although an ischemic etiology cannot be  totally excluded. The origins of the mesenteric vessels appear patent.  The bladder is well distended. Soft tissue density is noted inferior to the bladder eccentric to the left measuring 3.0 x 3.9 cm. This extends to the inferior aspect of the bladder but appears separate from the adjacent colon. Some soft tissue density is noted along the posterior wall of the rectum which may be related to fecal material. This is of uncertain significance. No significant lymphadenopathy is noted.  IMPRESSION: Changes consistent with colitis in the distal transverse and proximal descending colon. This may be related to diverticulitis although the possibility of an ischemic etiology would deserve consideration as well.  Soft tissue density inferior to the bladder and eccentric in the left pelvis as described. This is of uncertain etiology. Correlation with the physical exam is recommended.  Soft tissue density posteriorly in the rectum. Although this may represent fecal material possibility of an underlying mass would deserve consideration and correlation with physical exam is again recommended.  Right lower lobe infiltrate medially.   Electronically Signed  By: Inez Catalina M.D.   On: 02/06/2015 14:55    Scheduled Meds: . ciprofloxacin  400 mg Intravenous BID  . folic acid  1 mg Oral Daily  . metronidazole  500 mg Intravenous 3 times per day   Continuous Infusions: . sodium chloride      Principal Problem:   Colitis Active Problems:   Essential hypertension   LUPUS ERYTHEMATOSUS, DISCOID   Peripheral vascular disease   Discoid lupus erythematosus   Abdominal mass   HTN (hypertension)    Time spent: 40 minutes   [facility] Pager [phone]. If 7PM-7AM, please contact night-coverage at [email or web address], password [facility] 02/07/2015, 10:25 AM  LOS: 1 day

## 2015-02-08 ENCOUNTER — Inpatient Hospital Stay (HOSPITAL_COMMUNITY): Payer: Medicare Other

## 2015-02-08 DIAGNOSIS — J189 Pneumonia, unspecified organism: Secondary | ICD-10-CM

## 2015-02-08 DIAGNOSIS — L930 Discoid lupus erythematosus: Secondary | ICD-10-CM

## 2015-02-08 DIAGNOSIS — I10 Essential (primary) hypertension: Secondary | ICD-10-CM

## 2015-02-08 DIAGNOSIS — K625 Hemorrhage of anus and rectum: Secondary | ICD-10-CM | POA: Insufficient documentation

## 2015-02-08 DIAGNOSIS — K55039 Acute (reversible) ischemia of large intestine, extent unspecified: Secondary | ICD-10-CM | POA: Diagnosis present

## 2015-02-08 DIAGNOSIS — R1030 Lower abdominal pain, unspecified: Secondary | ICD-10-CM

## 2015-02-08 DIAGNOSIS — K550 Acute vascular disorders of intestine: Principal | ICD-10-CM

## 2015-02-08 LAB — COMPREHENSIVE METABOLIC PANEL
ALT: 13 U/L (ref 0–35)
ANION GAP: 7 (ref 5–15)
AST: 23 U/L (ref 0–37)
Albumin: 2.6 g/dL — ABNORMAL LOW (ref 3.5–5.2)
Alkaline Phosphatase: 112 U/L (ref 39–117)
BILIRUBIN TOTAL: 0.9 mg/dL (ref 0.3–1.2)
BUN: 6 mg/dL (ref 6–23)
CHLORIDE: 108 mmol/L (ref 96–112)
CO2: 24 mmol/L (ref 19–32)
Calcium: 8.3 mg/dL — ABNORMAL LOW (ref 8.4–10.5)
Creatinine, Ser: 0.8 mg/dL (ref 0.50–1.10)
GFR calc Af Amer: 82 mL/min — ABNORMAL LOW (ref >90)
GFR calc non Af Amer: 70 mL/min — ABNORMAL LOW (ref >90)
Glucose, Bld: 91 mg/dL (ref 70–99)
Potassium: 3.9 mmol/L (ref 3.5–5.1)
Sodium: 139 mmol/L (ref 135–145)
Total Protein: 6.5 g/dL (ref 6.0–8.3)

## 2015-02-08 LAB — CBC
HCT: 31.8 % — ABNORMAL LOW (ref 36.0–46.0)
Hemoglobin: 10.3 g/dL — ABNORMAL LOW (ref 12.0–15.0)
MCH: 30.8 pg (ref 26.0–34.0)
MCHC: 32.4 g/dL (ref 30.0–36.0)
MCV: 95.2 fL (ref 78.0–100.0)
Platelets: 186 10*3/uL (ref 150–400)
RBC: 3.34 MIL/uL — ABNORMAL LOW (ref 3.87–5.11)
RDW: 16.1 % — AB (ref 11.5–15.5)
WBC: 8 10*3/uL (ref 4.0–10.5)

## 2015-02-08 LAB — PROTIME-INR
INR: 1.14 (ref 0.00–1.49)
Prothrombin Time: 14.7 seconds (ref 11.6–15.2)

## 2015-02-08 LAB — TYPE AND SCREEN
ABO/RH(D): O POS
Antibody Screen: NEGATIVE
Unit division: 0

## 2015-02-08 MED ORDER — MOMETASONE FURO-FORMOTEROL FUM 100-5 MCG/ACT IN AERO
2.0000 | INHALATION_SPRAY | Freq: Two times a day (BID) | RESPIRATORY_TRACT | Status: DC
  Administered 2015-02-10 – 2015-02-12 (×4): 2 via RESPIRATORY_TRACT
  Filled 2015-02-08 (×2): qty 8.8

## 2015-02-08 NOTE — Progress Notes (Signed)
[facility] PROGRESS NOTE  Casey Wilkinson GGY:[id number] DOB: 1940/11/16 DOA: 02/06/2015 PCP: Precious Reel, MD  Assessment/Plan: #1 GI bleed/rectal bleeding Likely secondary to ischemic colitis vs diverticular bleed versus infectious colitis. C. difficile is negative. Patient is afebrile. Patient does not have a leukocytosis. Blood pressure medications on hold. Continue empiric IV Zosyn. Ciprofloxacin has been discontinued. Continue current pain management. GI following.  #2 right lower lobe community-acquired pneumonia Noted on CT of the abdomen and pelvis. Patient is currently afebrile. Normal WBC. Continue IV Zosyn.  #3 hypertension Hold antihypertensive agents for now. Follow.  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 Will resume on discharge.  #5 peripheral vascular disease Outpatient follow-up.  #6 acute blood loss anemia Secondary to problem #1. Patient is status post 1 unit packed red blood cells. Hemoglobin currently at 10.3.  #7 COPD Stable.  #8 prophylaxis SCDs for DVT prophylaxis.  Code Status: Full Family Communication: Updated patient no family at bedside. Disposition Plan: Home when medically stable.   Consultants:  Gastroenterology: Dr. Hilarie Fredrickson 02/07/2015  Procedures:  CT abdomen and pelvis 02/06/2015  Chest x-ray 02/06/2015  Abdominal films 02/08/2015  Antibiotics:  IV Zosyn 02/07/2015  IV ciprofloxacin 02/06/2015>>> 02/07/2015  IV Flagyl 02/06/15 >>>02/07/15  HPI/Subjective: Patient denies any further bloody stools. Patient states has not had a stool since admission. Patient still with abdominal pain however states being controlled by pain medication. Patient denies any cough. Patient denies any shortness of breath. Patient denies any chest pain.  Objective: Filed Vitals:   02/08/15 1327  BP: 130/65  Pulse: 82  Temp: 98.4 F (36.9 C)  Resp: 16    Intake/Output Summary (Last 24 hours) at 02/08/15 1552 Last data filed at  02/08/15 1022  Gross per 24 hour  Intake   1480 ml  Output      0 ml  Net   1480 ml   Filed Weights   02/06/15 1115  Weight: 76.204 kg (168 lb)    Exam:   General:  NAD  Cardiovascular: RRR  Respiratory: CTAB  Abdomen: Soft, Diffuse abdominal pain to palpation, positive bowel sounds, no rebound, no guarding.  Musculoskeletal: No clubbing cyanosis or edema.  Data Reviewed: Basic Metabolic Panel:  Recent Labs Lab 02/06/15 1204 02/08/15 0517  NA 137 139  K 3.7 3.9  CL 102 108  CO2 26 24  GLUCOSE 101* 91  BUN 13 6  CREATININE 0.78 0.80  CALCIUM 9.3 8.3*   Liver Function Tests:  Recent Labs Lab 02/06/15 1204 02/08/15 0517  AST 32 23  ALT 21 13  ALKPHOS 165* 112  BILITOT 0.7 0.9  PROT 8.1 6.5  ALBUMIN 3.3* 2.6*   No results for input(s): LIPASE, AMYLASE in the last 168 hours. No results for input(s): AMMONIA in the last 168 hours. CBC:  Recent Labs Lab 02/06/15 1204 02/06/15 1613 02/06/15 2356 02/07/15 0439 02/07/15 0848 02/08/15 0517  WBC 8.8  --   --  7.7  --  8.0  NEUTROABS 7.7  --   --   --   --   --   HGB 10.6* 10.0* 9.5* 9.6* 8.7* 10.3*  HCT 32.6*  --   --  29.4*  --  31.8*  MCV 96.7  --   --  95.5  --  95.2  PLT 243  --   --  207  --  186   Cardiac Enzymes: No results for input(s): CKTOTAL, CKMB, CKMBINDEX, TROPONINI in the last 168 hours. BNP (last 3 results)  No results for input(s): BNP in the last 8760 hours.  ProBNP (last 3 results) No results for input(s): PROBNP in the last 8760 hours.  CBG: No results for input(s): GLUCAP in the last 168 hours.  Recent Results (from the past 240 hour(s))  Clostridium Difficile by PCR     Status: None   Collection Time: 02/07/15  3:36 PM  Result Value Ref Range Status   C difficile by pcr NEGATIVE NEGATIVE Final     Studies: Dg Chest 2 View  02/06/2015   CLINICAL DATA:  Chronic shortness of breath.  COPD.  EXAM: CHEST  2 VIEW  COMPARISON:  07/13/2013 and previous  FINDINGS: There is  emphysema with lucency in the upper lobes in crowding of markings in the lower lobes. No sign of active infiltrate, lobar collapse or effusion. No bony abnormality. Heart size is normal. No pulmonary edema.  IMPRESSION: Advanced chronic emphysema. Crowding of the lung markings at the bases related to that. No acute process visible.   Electronically Signed   By: Nelson Chimes M.D.   On: 02/06/2015 18:18   Dg Abd 2 Views  02/08/2015   CLINICAL DATA:  Mid to lower abdominal pain. Colitis, diverticulitis.  EXAM: ABDOMEN - 2 VIEW  COMPARISON:  CT 02/06/2015  FINDINGS: Nonobstructive bowel gas pattern. Gas throughout nondistended large and small bowel. No free air organomegaly. Oral contrast material noted within portions of the colon.  Probable fibrosis changes within the lung bases.  No effusions.  IMPRESSION: Nonobstructive bowel gas pattern.  No acute findings.   Electronically Signed   By: Rolm Baptise M.D.   On: 02/08/2015 12:39    Scheduled Meds: . folic acid  1 mg Oral Daily  . mometasone-formoterol  2 puff Inhalation BID  . piperacillin-tazobactam (ZOSYN)  IV  3.375 g Intravenous 3 times per day   Continuous Infusions: . sodium chloride 100 mL/hr at 02/08/15 0325    Principal Problem:   Colitis Active Problems:   CAP (community acquired pneumonia)   Essential hypertension   LUPUS ERYTHEMATOSUS, DISCOID   Peripheral vascular disease   Discoid lupus erythematosus   Abdominal mass   HTN (hypertension)   Abdominal pain   Blood in stool    Time spent: 35 minutes    [facility] MD [facility] Pager [phone]. If 7PM-7AM, please contact night-coverage at [email or web address], password [facility] 02/08/2015, 3:52 PM  LOS: 2 days

## 2015-02-08 NOTE — Progress Notes (Signed)
The infection control nurse wonders why I stopped enteric precautions when the stool path panel is still pending.  Stopped the precuutions as pt has not had BM since late afternoon/early evening yesterday 4/19 and the clinical picture is strongly c/w ischemic colitis, not infectious.  C diff is negative,  Stool clx and path panel in process.   Azucena Freed PA-C

## 2015-02-08 NOTE — Progress Notes (Signed)
Daily Rounding Note  02/08/2015, 9:48 AM  LOS: 2 days   SUBJECTIVE:       Last bloody stool and last stool was yeasterday early evening.  Non-focal lower abdominal pain continues, better controlled with increased dose Morphine and Hyoscyamine.   No nausea.  Pain not worse with po.   OBJECTIVE:         Vital signs in last 24 hours:    Temp:  [97.5 F (36.4 C)-98.5 F (36.9 C)] 98.2 F (36.8 C) (04/20 5003) Pulse Rate:  [74-90] 90 (04/20 0608) Resp:  [16-18] 17 (04/20 0608) BP: (105-144)/(45-74) 144/53 mmHg (04/20 0608) SpO2:  [94 %-99 %] 94 % (04/20 0608) Last BM Date: 02/07/15 Filed Weights   02/06/15 1115  Weight: 168 lb (76.204 kg)   General: pleasant, non-toxic.  Somewhat uncomfortable    Heart: RRR Chest: clear bil. No laboreds resps.  No cough but voice raspy.  Abdomen: soft, BS active, tender bil at mid and lower abdomen.  + rebound.   Extremities: no CCE Neuro/Psych:  Pleasant, cooperative, fully alert/oriented.  No gross deficits.   Intake/Output from previous day: 04/19 0701 - 04/20 0700 In: 1980 [P.O.:720; I.V.:900; Blood:260; IV Piggyback:100] Out: -   Intake/Output this shift:    Lab Results:  Recent Labs  02/06/15 1204  02/07/15 0439 02/07/15 0848 02/08/15 0517  WBC 8.8  --  7.7  --  8.0  HGB 10.6*  < > 9.6* 8.7* 10.3*  HCT 32.6*  --  29.4*  --  31.8*  PLT 243  --  207  --  186  < > = values in this interval not displayed. BMET  Recent Labs  02/06/15 1204 02/08/15 0517  NA 137 139  K 3.7 3.9  CL 102 108  CO2 26 24  GLUCOSE 101* 91  BUN 13 6  CREATININE 0.78 0.80  CALCIUM 9.3 8.3*   LFT  Recent Labs  02/06/15 1204 02/08/15 0517  PROT 8.1 6.5  ALBUMIN 3.3* 2.6*  AST 32 23  ALT 21 13  ALKPHOS 165* 112  BILITOT 0.7 0.9   PT/INR  Recent Labs  02/07/15 0439 02/08/15 0517  LABPROT 14.8 14.7  INR 1.15 1.14   Hepatitis Panel No results for input(s): HEPBSAG,  HCVAB, HEPAIGM, HEPBIGM in the last 72 hours.  Studies/Results: Dg Chest 2 View  02/06/2015   CLINICAL DATA:  Chronic shortness of breath.  COPD.  EXAM: CHEST  2 VIEW  COMPARISON:  07/13/2013 and previous  FINDINGS: There is emphysema with lucency in the upper lobes in crowding of markings in the lower lobes. No sign of active infiltrate, lobar collapse or effusion. No bony abnormality. Heart size is normal. No pulmonary edema.  IMPRESSION: Advanced chronic emphysema. Crowding of the lung markings at the bases related to that. No acute process visible.   Electronically Signed   By: Nelson Chimes M.D.   On: 02/06/2015 18:18   Ct Abdomen Pelvis W Contrast  02/06/2015   CLINICAL DATA:  Bright red blood per rectum with abdominal pain  EXAM: CT ABDOMEN AND PELVIS WITH CONTRAST  TECHNIQUE: Multidetector CT imaging of the abdomen and pelvis was performed using the standard protocol following bolus administration of intravenous contrast.  CONTRAST:  181m OMNIPAQUE IOHEXOL 300 MG/ML  SOLN  COMPARISON:  None.  FINDINGS: Lung bases are well aerated. Focal infiltrate is noted the medial aspect of the right lower lobe.  The liver, gallbladder, spleen, adrenal  glands and pancreas are all normal in their CT appearance. The kidneys are well visualized bilaterally with a normal enhancement pattern. The distal transverse colon and proximal descending colon demonstrate wall thickening and edema with pericolonic inflammatory changes consistent with colitis. These changes may be related to diverticulitis although an ischemic etiology cannot be totally excluded. The origins of the mesenteric vessels appear patent.  The bladder is well distended. Soft tissue density is noted inferior to the bladder eccentric to the left measuring 3.0 x 3.9 cm. This extends to the inferior aspect of the bladder but appears separate from the adjacent colon. Some soft tissue density is noted along the posterior wall of the rectum which may be related  to fecal material. This is of uncertain significance. No significant lymphadenopathy is noted.  IMPRESSION: Changes consistent with colitis in the distal transverse and proximal descending colon. This may be related to diverticulitis although the possibility of an ischemic etiology would deserve consideration as well.  Soft tissue density inferior to the bladder and eccentric in the left pelvis as described. This is of uncertain etiology. Correlation with the physical exam is recommended.  Soft tissue density posteriorly in the rectum. Although this may represent fecal material possibility of an underlying mass would deserve consideration and correlation with physical exam is again recommended.  Right lower lobe infiltrate medially.   Electronically Signed   By: Inez Catalina M.D.   On: 02/06/2015 14:55   Scheduled Meds: . folic acid  1 mg Oral Daily  . mometasone-formoterol  2 puff Inhalation BID  . piperacillin-tazobactam (ZOSYN)  IV  3.375 g Intravenous 3 times per day   Continuous Infusions: . sodium chloride 100 mL/hr at 02/08/15 0325   PRN Meds:.albuterol, hyoscyamine, morphine injection   ASSESMENT:   *  Hematochezia, abdominal pain. Suspect ischemic colitis. Low suspicion for concomitant diverticulitis (normal WBCs, no fever) for which she is receiving empiric Zosyn.  2007 colonoscopy: diverticulosis, mixed rrhoids, no polyps.   *  Peripheral artery disease, carotid artery stenosis.  81 mg ASA PTA.   *  Soft tissue densities left pelvis and posterior to rectum.  Will ultimately need a colonoscopy and post convalescent CT (say in 6 weeks post recovery from current illness).    *  SLE, chronic Methotrexate.    PLAN   *  Continue clears until pain is improving.   *  Given  Physical exam, will leave Zosyn in place.  *  D/c Telemetry.  Leave IVF at 100 cc per hour.  *  If continues with abdominal pain, may need a second CT or xray to assess for signs of gangrene.     Azucena Freed   02/08/2015, 9:48 AM Pager: [phone]

## 2015-02-09 DIAGNOSIS — R109 Unspecified abdominal pain: Secondary | ICD-10-CM

## 2015-02-09 DIAGNOSIS — R1084 Generalized abdominal pain: Secondary | ICD-10-CM

## 2015-02-09 LAB — BASIC METABOLIC PANEL
Anion gap: 6 (ref 5–15)
BUN: 5 mg/dL — AB (ref 6–23)
CO2: 26 mmol/L (ref 19–32)
Calcium: 8.2 mg/dL — ABNORMAL LOW (ref 8.4–10.5)
Chloride: 105 mmol/L (ref 96–112)
Creatinine, Ser: 0.75 mg/dL (ref 0.50–1.10)
GFR calc Af Amer: >90 mL/min (ref >90)
GFR calc non Af Amer: 81 mL/min — ABNORMAL LOW (ref >90)
Glucose, Bld: 106 mg/dL — ABNORMAL HIGH (ref 70–99)
Potassium: 3.4 mmol/L — ABNORMAL LOW (ref 3.5–5.1)
Sodium: 137 mmol/L (ref 135–145)

## 2015-02-09 LAB — CBC
HEMATOCRIT: 29.8 % — AB (ref 36.0–46.0)
Hemoglobin: 9.7 g/dL — ABNORMAL LOW (ref 12.0–15.0)
MCH: 31 pg (ref 26.0–34.0)
MCHC: 32.6 g/dL (ref 30.0–36.0)
MCV: 95.2 fL (ref 78.0–100.0)
Platelets: 179 10*3/uL (ref 150–400)
RBC: 3.13 MIL/uL — ABNORMAL LOW (ref 3.87–5.11)
RDW: 15.7 % — AB (ref 11.5–15.5)
WBC: 7 10*3/uL (ref 4.0–10.5)

## 2015-02-09 LAB — MAGNESIUM: MAGNESIUM: 1.8 mg/dL (ref 1.5–2.5)

## 2015-02-09 LAB — GI PATHOGEN PANEL BY PCR, STOOL
C difficile toxin A/B: NOT DETECTED
Campylobacter by PCR: NOT DETECTED
Cryptosporidium by PCR: NOT DETECTED
E COLI (STEC): NOT DETECTED
E coli (ETEC) LT/ST: NOT DETECTED
E coli 0157 by PCR: NOT DETECTED
G lamblia by PCR: NOT DETECTED
NOROVIRUS G1/G2: NOT DETECTED
ROTAVIRUS A BY PCR: NOT DETECTED
Salmonella by PCR: NOT DETECTED
Shigella by PCR: NOT DETECTED

## 2015-02-09 MED ORDER — POTASSIUM CHLORIDE CRYS ER 20 MEQ PO TBCR
40.0000 meq | EXTENDED_RELEASE_TABLET | Freq: Once | ORAL | Status: AC
  Administered 2015-02-09: 40 meq via ORAL
  Filled 2015-02-09: qty 2

## 2015-02-09 NOTE — Progress Notes (Signed)
          Daily Rounding Note  02/09/2015, 9:26 AM  LOS: 3 days   SUBJECTIVE:       Last Morphine at 0625 today.  Lower abd pain continues, morphine and Levsin helping.  Some discomfort in upper belly, like sh's been punched.  Fleeting nausea.  On clears.  Last BM was 4/19  OBJECTIVE:         Vital signs in last 24 hours:    Temp:  [98.4 F (36.9 C)-98.9 F (37.2 C)] 98.4 F (36.9 C) (04/21 0628) Pulse Rate:  [82-88] 88 (04/21 0628) Resp:  [16-19] 19 (04/21 0628) BP: (129-134)/(47-75) 129/47 mmHg (04/21 0628) SpO2:  [93 %-94 %] 93 % (04/21 0628) Last BM Date: 02/07/15 Filed Weights   02/06/15 1115  Weight: 168 lb (76.204 kg)   General: looks well, comfortable   Heart: RRR Chest: clear bil.   Abdomen: mild distension,  BS non-tympanitic.  Slight tenderness in lower abdomen Extremities: no CCE Neuro/Psych:  Oriented x 3, alert.  No gross deficits, weakness or tremor.   Intake/Output from previous day: 2023-03-05 0701 - 04/21 0700 In: 2690 [P.O.:1440; I.V.:1200; IV Piggyback:50] Out: -   Intake/Output this shift:    Lab Results:  Recent Labs  02/06/15 1204  02/07/15 0439 02/07/15 0848 Mar 05, 2015 0517  WBC 8.8  --  7.7  --  8.0  HGB 10.6*  < > 9.6* 8.7* 10.3*  HCT 32.6*  --  29.4*  --  31.8*  PLT 243  --  207  --  186  < > = values in this interval not displayed. BMET  Recent Labs  02/06/15 1204 03/05/2015 0517  NA 137 139  K 3.7 3.9  CL 102 108  CO2 26 24  GLUCOSE 101* 91  BUN 13 6  CREATININE 0.78 0.80  CALCIUM 9.3 8.3*   LFT  Recent Labs  02/06/15 1204 03-05-2015 0517  PROT 8.1 6.5  ALBUMIN 3.3* 2.6*  AST 32 23  ALT 21 13  ALKPHOS 165* 112  BILITOT 0.7 0.9   PT/INR  Recent Labs  02/07/15 0439 2015-03-05 0517  LABPROT 14.8 14.7  INR 1.15 1.14    Studies/Results: Dg Abd 2 Views  Mar 05, 2015   CLINICAL DATA:  Mid to lower abdominal pain. Colitis, diverticulitis.  EXAM: ABDOMEN - 2 VIEW   COMPARISON:  CT 02/06/2015  FINDINGS: Nonobstructive bowel gas pattern. Gas throughout nondistended large and small bowel. No free air organomegaly. Oral contrast material noted within portions of the colon.  Probable fibrosis changes within the lung bases.  No effusions.  IMPRESSION: Nonobstructive bowel gas pattern.  No acute findings.   Electronically Signed   By: Rolm Baptise M.D.   On: 03/05/15 12:39    ASSESMENT:   * Hematochezia, abdominal pain. Suspect ischemic colitis. Low suspicion for concomitant diverticulitis (normal WBCs, no fever) for which she is receiving empiric Zosyn. Hgb stable. 2007 colonoscopy: diverticulosis, mixed rrhoids, no polyps.   * Peripheral artery disease, carotid artery stenosis. 81 mg ASA PTA.   * Soft tissue densities left pelvis and posterior to rectum. Will ultimately need a colonoscopy and post convalescent CT (say in 6 weeks post recovery from current illness). ? Does she need pelvic ultrasound?   * SLE, chronic Methotrexate.     PLAN   *  Supportive care.  Continue  Zosyn.      Azucena Freed  02/09/2015, 9:26 AM Pager: [phone]

## 2015-02-09 NOTE — Progress Notes (Signed)
[facility] PROGRESS NOTE  Casey Wilkinson WUX:[id number] DOB: Mar 01, 1941 DOA: 02/06/2015 PCP: Precious Reel, MD  Assessment/Plan: #1 GI bleed/rectal bleeding secondary to acute ischemic colitis Likely secondary to ischemic colitis vs diverticular bleed versus infectious colitis. C. difficile is negative. Patient is afebrile. Patient does not have a leukocytosis. Blood pressure medications on hold. Continue empiric IV Zosyn. Ciprofloxacin has been discontinued. Follow H&H. Continue current pain management. GI following.  #2 right lower lobe community-acquired pneumonia Noted on CT of the abdomen and pelvis. Patient is currently afebrile. Normal WBC. Continue IV Zosyn.  #3 hypertension Hold antihypertensive agents for now. Follow.  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 Will resume on discharge.  #5 peripheral vascular disease Outpatient follow-up.  #6 acute blood loss anemia Secondary to problem #1. Patient is status post 1 unit packed red blood cells. Hemoglobin currently at 9.7.  #7 COPD Stable.  #8 prophylaxis SCDs for DVT prophylaxis.  Code Status: Full Family Communication: Updated patient no family at bedside. Disposition Plan: Home when medically stable.   Consultants:  Gastroenterology: Dr. Hilarie Fredrickson 02/07/2015  Procedures:  CT abdomen and pelvis 02/06/2015  Chest x-ray 02/06/2015  Abdominal films 02/08/2015  Antibiotics:  IV Zosyn 02/07/2015  IV ciprofloxacin 02/06/2015>>> 02/07/2015  IV Flagyl 02/06/15 >>>02/07/15  HPI/Subjective: Patient denies bloody stools. Patient still with abdominal pain. Patient denies any cough. Patient denies any shortness of breath. Patient denies any chest pain.  Objective: Filed Vitals:   02/09/15 0628  BP: 129/47  Pulse: 88  Temp: 98.4 F (36.9 C)  Resp: 19    Intake/Output Summary (Last 24 hours) at 02/09/15 1305 Last data filed at 02/09/15 0900  Gross per 24 hour  Intake   2450 ml  Output       0 ml  Net   2450 ml   Filed Weights   02/06/15 1115  Weight: 76.204 kg (168 lb)    Exam:   General:  NAD  Cardiovascular: RRR  Respiratory: CTAB  Abdomen: Soft, Diffuse abdominal pain to palpation greater in the lower quadrants, positive bowel sounds, no rebound, no guarding.  Musculoskeletal: No clubbing cyanosis or edema.  Data Reviewed: Basic Metabolic Panel:  Recent Labs Lab 02/06/15 1204 02/08/15 0517 02/09/15 1038  NA 137 139 137  K 3.7 3.9 3.4*  CL 102 108 105  CO2 '26 24 26  '$ GLUCOSE 101* 91 106*  BUN 13 6 5*  CREATININE 0.78 0.80 0.75  CALCIUM 9.3 8.3* 8.2*  MG  --   --  1.8   Liver Function Tests:  Recent Labs Lab 02/06/15 1204 02/08/15 0517  AST 32 23  ALT 21 13  ALKPHOS 165* 112  BILITOT 0.7 0.9  PROT 8.1 6.5  ALBUMIN 3.3* 2.6*   No results for input(s): LIPASE, AMYLASE in the last 168 hours. No results for input(s): AMMONIA in the last 168 hours. CBC:  Recent Labs Lab 02/06/15 1204  02/06/15 2356 02/07/15 0439 02/07/15 0848 02/08/15 0517 02/09/15 1038  WBC 8.8  --   --  7.7  --  8.0 7.0  NEUTROABS 7.7  --   --   --   --   --   --   HGB 10.6*  < > 9.5* 9.6* 8.7* 10.3* 9.7*  HCT 32.6*  --   --  29.4*  --  31.8* 29.8*  MCV 96.7  --   --  95.5  --  95.2 95.2  PLT 243  --   --  207  --  186 179  < > = values in this interval not displayed. Cardiac Enzymes: No results for input(s): CKTOTAL, CKMB, CKMBINDEX, TROPONINI in the last 168 hours. BNP (last 3 results) No results for input(s): BNP in the last 8760 hours.  ProBNP (last 3 results) No results for input(s): PROBNP in the last 8760 hours.  CBG: No results for input(s): GLUCAP in the last 168 hours.  Recent Results (from the past 240 hour(s))  Clostridium Difficile by PCR     Status: None   Collection Time: 02/07/15  3:36 PM  Result Value Ref Range Status   C difficile by pcr NEGATIVE NEGATIVE Final  Stool culture     Status: None (Preliminary result)   Collection Time:  02/07/15  3:36 PM  Result Value Ref Range Status   Specimen Description STOOL  Final   Special Requests NONE  Final   Culture   Final    NO SUSPICIOUS COLONIES, CONTINUING TO HOLD Performed at [facility]    Report Status PENDING  Incomplete     Studies: Dg Abd 2 Views  02/08/2015   CLINICAL DATA:  Mid to lower abdominal pain. Colitis, diverticulitis.  EXAM: ABDOMEN - 2 VIEW  COMPARISON:  CT 02/06/2015  FINDINGS: Nonobstructive bowel gas pattern. Gas throughout nondistended large and small bowel. No free air organomegaly. Oral contrast material noted within portions of the colon.  Probable fibrosis changes within the lung bases.  No effusions.  IMPRESSION: Nonobstructive bowel gas pattern.  No acute findings.   Electronically Signed   By: Rolm Baptise M.D.   On: 02/08/2015 12:39    Scheduled Meds: . folic acid  1 mg Oral Daily  . mometasone-formoterol  2 puff Inhalation BID  . piperacillin-tazobactam (ZOSYN)  IV  3.375 g Intravenous 3 times per day  . potassium chloride  40 mEq Oral Once   Continuous Infusions: . sodium chloride 50 mL/hr at 02/09/15 0900    Principal Problem:   Colitis Active Problems:   CAP (community acquired pneumonia)   Essential hypertension   LUPUS ERYTHEMATOSUS, DISCOID   Peripheral vascular disease   Discoid lupus erythematosus   Abdominal mass   HTN (hypertension)   Abdominal pain   Blood in stool   Acute ischemic colitis   Rectal bleeding    Time spent: 25 minutes    [facility] MD [facility] Pager [phone]. If 7PM-7AM, please contact night-coverage at [email or web address], password [facility] 02/09/2015, 1:05 PM  LOS: 3 days

## 2015-02-10 LAB — BASIC METABOLIC PANEL
ANION GAP: 5 (ref 5–15)
CALCIUM: 8 mg/dL — AB (ref 8.4–10.5)
CHLORIDE: 105 mmol/L (ref 96–112)
CO2: 26 mmol/L (ref 19–32)
CREATININE: 0.73 mg/dL (ref 0.50–1.10)
GFR, EST NON AFRICAN AMERICAN: 82 mL/min — AB (ref >90)
Glucose, Bld: 90 mg/dL (ref 70–99)
Potassium: 3.6 mmol/L (ref 3.5–5.1)
Sodium: 136 mmol/L (ref 135–145)

## 2015-02-10 LAB — CBC
HCT: 29.5 % — ABNORMAL LOW (ref 36.0–46.0)
Hemoglobin: 9.8 g/dL — ABNORMAL LOW (ref 12.0–15.0)
MCH: 31.3 pg (ref 26.0–34.0)
MCHC: 33.2 g/dL (ref 30.0–36.0)
MCV: 94.2 fL (ref 78.0–100.0)
PLATELETS: 188 10*3/uL (ref 150–400)
RBC: 3.13 MIL/uL — ABNORMAL LOW (ref 3.87–5.11)
RDW: 15.6 % — AB (ref 11.5–15.5)
WBC: 5.8 10*3/uL (ref 4.0–10.5)

## 2015-02-10 MED ORDER — DOCUSATE SODIUM 100 MG PO CAPS
100.0000 mg | ORAL_CAPSULE | Freq: Two times a day (BID) | ORAL | Status: DC
  Administered 2015-02-10: 100 mg via ORAL
  Filled 2015-02-10: qty 1

## 2015-02-10 MED ORDER — AMOXICILLIN-POT CLAVULANATE 875-125 MG PO TABS
1.0000 | ORAL_TABLET | Freq: Two times a day (BID) | ORAL | Status: DC
Start: 2015-02-10 — End: 2015-02-11
  Administered 2015-02-10 – 2015-02-11 (×3): 1 via ORAL
  Filled 2015-02-10 (×3): qty 1

## 2015-02-10 MED ORDER — OXYCODONE-ACETAMINOPHEN 5-325 MG PO TABS
1.0000 | ORAL_TABLET | ORAL | Status: DC | PRN
  Administered 2015-02-10 – 2015-02-11 (×4): 2 via ORAL
  Filled 2015-02-10 (×4): qty 2

## 2015-02-10 NOTE — Care Management Note (Signed)
  Page 1 of 1   02/10/2015     8:23:59 AM CARE MANAGEMENT NOTE 02/10/2015  Patient:  Casey Wilkinson, Casey Wilkinson   Account Number:  [id number]  Date Initiated:  02/10/2015  Documentation initiated by:  Magdalen Spatz  Subjective/Objective Assessment:   PNA , GI bleed     Action/Plan:   Anticipated DC Date:  02/12/2015   Anticipated DC Plan:  HOME/SELF CARE         Choice offered to / List presented to:             Status of service:  In process, will continue to follow Medicare Important Message given?  YES (If response is "NO", the following Medicare IM given date fields will be blank) Date Medicare IM given:  02/10/2015 Medicare IM given by:  Magdalen Spatz Date Additional Medicare IM given:   Additional Medicare IM given by:    Discharge Disposition:    Per UR Regulation:  Reviewed for med. necessity/level of care/duration of stay  If discussed at [place] of Stay Meetings, dates discussed:    Comments:

## 2015-02-10 NOTE — Progress Notes (Signed)
[facility] PROGRESS NOTE  Casey Wilkinson IOE:[id number] DOB: Dec 29, 1940 DOA: 02/06/2015 PCP: Precious Reel, MD  Assessment/Plan: #1 GI bleed/rectal bleeding secondary to acute ischemic colitis Likely secondary to ischemic colitis vs diverticular bleed versus infectious colitis. C. difficile is negative. Patient is afebrile. Patient does not have a leukocytosis. Blood pressure medications on hold. Ciprofloxacin has been discontinued. Follow H&H. Continue current pain management. GI following.  #2 right lower lobe community-acquired pneumonia Noted on CT of the abdomen and pelvis. Patient is currently afebrile. Normal WBC. Change IV Zosyn to oral Augmentin to complete a course of antibiotic therapy.  #3 hypertension Hold antihypertensive agents for now. Follow.  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 Will resume on discharge.  #5 peripheral vascular disease Outpatient follow-up.  #6 acute blood loss anemia Secondary to problem #1. Patient is status post 1 unit packed red blood cells. Hemoglobin currently at 9.8.  #7 COPD Stable.  #8 prophylaxis SCDs for DVT prophylaxis.  Code Status: Full Family Communication: Updated patient no family at bedside. Disposition Plan: Home when medically stable, hopefully in the morning.   Consultants:  Gastroenterology: Dr. Hilarie Fredrickson 02/07/2015  Procedures:  CT abdomen and pelvis 02/06/2015  Chest x-ray 02/06/2015  Abdominal films 02/08/2015  Antibiotics:  IV Zosyn 02/07/2015>>>02/10/15  IV ciprofloxacin 02/06/2015>>> 02/07/2015  IV Flagyl 02/06/15 >>>02/07/15  Oral Augmentin for 22 2016  HPI/Subjective: Patient denies bloody stools. Patient states abdominal pain improving. Patient with no bowel movement.  Objective: Filed Vitals:   02/10/15 1336  BP: 133/64  Pulse: 88  Temp: 98.6 F (37 C)  Resp: 18    Intake/Output Summary (Last 24 hours) at 02/10/15 1811 Last data filed at 02/10/15 1002  Gross per 24  hour  Intake 1192.5 ml  Output      0 ml  Net 1192.5 ml   Filed Weights   02/06/15 1115  Weight: 76.204 kg (168 lb)    Exam:   General:  NAD  Cardiovascular: RRR  Respiratory: CTAB  Abdomen: Soft, Less TTP diffusely, positive bowel sounds, no rebound, no guarding.  Musculoskeletal: No clubbing cyanosis or edema.  Data Reviewed: Basic Metabolic Panel:  Recent Labs Lab 02/06/15 1204 02/08/15 0517 02/09/15 1038 02/10/15 0530  NA 137 139 137 136  K 3.7 3.9 3.4* 3.6  CL 102 108 105 105  CO2 '26 24 26 26  '$ GLUCOSE 101* 91 106* 90  BUN 13 6 5* <5*  CREATININE 0.78 0.80 0.75 0.73  CALCIUM 9.3 8.3* 8.2* 8.0*  MG  --   --  1.8  --    Liver Function Tests:  Recent Labs Lab 02/06/15 1204 02/08/15 0517  AST 32 23  ALT 21 13  ALKPHOS 165* 112  BILITOT 0.7 0.9  PROT 8.1 6.5  ALBUMIN 3.3* 2.6*   No results for input(s): LIPASE, AMYLASE in the last 168 hours. No results for input(s): AMMONIA in the last 168 hours. CBC:  Recent Labs Lab 02/06/15 1204  02/07/15 0439 02/07/15 0848 02/08/15 0517 02/09/15 1038 02/10/15 0530  WBC 8.8  --  7.7  --  8.0 7.0 5.8  NEUTROABS 7.7  --   --   --   --   --   --   HGB 10.6*  < > 9.6* 8.7* 10.3* 9.7* 9.8*  HCT 32.6*  --  29.4*  --  31.8* 29.8* 29.5*  MCV 96.7  --  95.5  --  95.2 95.2 94.2  PLT 243  --  207  --  186 179 188  < > = values in this interval not displayed. Cardiac Enzymes: No results for input(s): CKTOTAL, CKMB, CKMBINDEX, TROPONINI in the last 168 hours. BNP (last 3 results) No results for input(s): BNP in the last 8760 hours.  ProBNP (last 3 results) No results for input(s): PROBNP in the last 8760 hours.  CBG: No results for input(s): GLUCAP in the last 168 hours.  Recent Results (from the past 240 hour(s))  Clostridium Difficile by PCR     Status: None   Collection Time: 02/07/15  3:36 PM  Result Value Ref Range Status   C difficile by pcr NEGATIVE NEGATIVE Final  Stool culture     Status: None  (Preliminary result)   Collection Time: 02/07/15  3:36 PM  Result Value Ref Range Status   Specimen Description STOOL  Final   Special Requests NONE  Final   Culture   Final    NO SUSPICIOUS COLONIES, CONTINUING TO HOLD Performed at [facility]    Report Status PENDING  Incomplete     Studies: No results found.  Scheduled Meds: . amoxicillin-clavulanate  1 tablet Oral Q12H  . docusate sodium  100 mg Oral BID  . folic acid  1 mg Oral Daily  . mometasone-formoterol  2 puff Inhalation BID   Continuous Infusions:    Principal Problem:   Colitis Active Problems:   CAP (community acquired pneumonia)   Essential hypertension   LUPUS ERYTHEMATOSUS, DISCOID   Peripheral vascular disease   Discoid lupus erythematosus   Abdominal mass   HTN (hypertension)   Abdominal pain   Blood in stool   Acute ischemic colitis   Rectal bleeding    Time spent: 77 minutes    [facility] MD [facility] Pager [phone]. If 7PM-7AM, please contact night-coverage at [email or web address], password [facility] 02/10/2015, 6:11 PM  LOS: 4 days

## 2015-02-10 NOTE — Progress Notes (Signed)
Daily Rounding Note  02/10/2015, 8:21 AM  LOS: 4 days   SUBJECTIVE:       Used total 10 mg Morphine 4/21, same as on Mar 09, 2023.  Used 2 mg thus far today.  Pain is musch better.  Smear of stools today, no blood noted.  No n/v.  Passing flatus.   OBJECTIVE:         Vital signs in last 24 hours:    Temp:  [98.2 F (36.8 C)-98.4 F (36.9 C)] 98.3 F (36.8 C) (04/22 0527) Pulse Rate:  [88-91] 91 (04/22 0527) Resp:  [17-18] 17 (04/22 0527) BP: (125-144)/(53-63) 125/56 mmHg (04/22 0527) SpO2:  [91 %-96 %] 91 % (04/22 0527) Last BM Date: 02/07/15 Filed Weights   02/06/15 1115  Weight: 168 lb (76.204 kg)   General: pleasant, comfortable, non-toxic.     Heart: RRR.   Chest: diminished BS in right base, some dyspnea  With speech (pt feels at her resp baseline) Abdomen: soft, minor tenderness on left.  Active BS.  No G/R.   Extremities: no CCE Neuro/Psych:  Pleasant, oriented x 3.  Alert.  In good spirits.   Intake/Output from previous day: 04/21 0701 - 04/22 0700 In: 3242.5 [P.O.:480; I.V.:2750; IV Piggyback:12.5] Out: -   Intake/Output this shift:    Lab Results:  Recent Labs  03/09/15 0517 02/09/15 1038 02/10/15 0530  WBC 8.0 7.0 5.8  HGB 10.3* 9.7* 9.8*  HCT 31.8* 29.8* 29.5*  PLT 186 179 188   BMET  Recent Labs  09-Mar-2015 0517 02/09/15 1038 02/10/15 0530  NA 139 137 136  K 3.9 3.4* 3.6  CL 108 105 105  CO2 '24 26 26  '$ GLUCOSE 91 106* 90  BUN 6 5* <5*  CREATININE 0.80 0.75 0.73  CALCIUM 8.3* 8.2* 8.0*   LFT  Recent Labs  03/09/2015 0517  PROT 6.5  ALBUMIN 2.6*  AST 23  ALT 13  ALKPHOS 112  BILITOT 0.9   PT/INR  Recent Labs  03-09-15 0517  LABPROT 14.7  INR 1.14   Hepatitis Panel No results for input(s): HEPBSAG, HCVAB, HEPAIGM, HEPBIGM in the last 72 hours.  Studies/Results: Dg Abd 2 Views  03/09/2015   CLINICAL DATA:  Mid to lower abdominal pain. Colitis, diverticulitis.  EXAM:  ABDOMEN - 2 VIEW  COMPARISON:  CT 02/06/2015  FINDINGS: Nonobstructive bowel gas pattern. Gas throughout nondistended large and small bowel. No free air organomegaly. Oral contrast material noted within portions of the colon.  Probable fibrosis changes within the lung bases.  No effusions.  IMPRESSION: Nonobstructive bowel gas pattern.  No acute findings.   Electronically Signed   By: Rolm Baptise M.D.   On: 09-Mar-2015 12:39   Scheduled Meds: . folic acid  1 mg Oral Daily  . mometasone-formoterol  2 puff Inhalation BID  . piperacillin-tazobactam (ZOSYN)  IV  3.375 g Intravenous 3 times per day   Continuous Infusions: . sodium chloride 50 mL/hr (02/10/15 0558)   PRN Meds:.albuterol, hyoscyamine, morphine injection   ASSESMENT:   * Hematochezia, abdominal pain. Suspect ischemic colitis. Low suspicion for concomitant diverticulitis (normal WBCs, no fever). Hgb stable.  Stool path panel all negative.  2007 colonoscopy: diverticulosis, mixed rrhoids, no polyps.   * Peripheral artery disease, carotid artery stenosis. 81 mg ASA PTA.   *  Right LL lung infiltrate on CT.  Day 4 Zosyn. Baseline COPD and advanced emphysema.   * Soft tissue densities left pelvis and posterior  to rectum. Will ultimately need a colonoscopy and post convalescent CT (say in 6 weeks post recovery from current illness). ? Does she need pelvic ultrasound? (Dr Virgina Jock is PMD, Henrene Pastor is GI)  *  Normocytic anemia. S/p 1 unit PRBCs 4/19. Do not have her baseline.   * SLE, chronic Methotrexate.     PLAN   *  Advance to soft diet, stop IVF, encourage walking around unit.   *  Consider change to po abx for CAP.  *  Home later today?     Casey Wilkinson  02/10/2015, 8:21 AM Pager: [phone]

## 2015-02-11 ENCOUNTER — Inpatient Hospital Stay (HOSPITAL_COMMUNITY): Payer: Medicare Other

## 2015-02-11 DIAGNOSIS — R112 Nausea with vomiting, unspecified: Secondary | ICD-10-CM

## 2015-02-11 LAB — BASIC METABOLIC PANEL
Anion gap: 6 (ref 5–15)
CALCIUM: 8.2 mg/dL — AB (ref 8.4–10.5)
CO2: 26 mmol/L (ref 19–32)
Chloride: 106 mmol/L (ref 96–112)
Creatinine, Ser: 0.61 mg/dL (ref 0.50–1.10)
GFR calc Af Amer: >90 mL/min (ref >90)
GFR calc non Af Amer: 87 mL/min — ABNORMAL LOW (ref >90)
Glucose, Bld: 97 mg/dL (ref 70–99)
Potassium: 3.6 mmol/L (ref 3.5–5.1)
SODIUM: 138 mmol/L (ref 135–145)

## 2015-02-11 LAB — CBC
HCT: 28.3 % — ABNORMAL LOW (ref 36.0–46.0)
Hemoglobin: 9.4 g/dL — ABNORMAL LOW (ref 12.0–15.0)
MCH: 31 pg (ref 26.0–34.0)
MCHC: 33.2 g/dL (ref 30.0–36.0)
MCV: 93.4 fL (ref 78.0–100.0)
PLATELETS: 202 10*3/uL (ref 150–400)
RBC: 3.03 MIL/uL — ABNORMAL LOW (ref 3.87–5.11)
RDW: 15.6 % — ABNORMAL HIGH (ref 11.5–15.5)
WBC: 4.8 10*3/uL (ref 4.0–10.5)

## 2015-02-11 LAB — STOOL CULTURE

## 2015-02-11 MED ORDER — ONDANSETRON HCL 4 MG/2ML IJ SOLN
4.0000 mg | Freq: Four times a day (QID) | INTRAMUSCULAR | Status: DC | PRN
  Administered 2015-02-11: 4 mg via INTRAVENOUS
  Filled 2015-02-11: qty 2

## 2015-02-11 NOTE — Progress Notes (Signed)
Daily Rounding Note  02/11/2015, 12:14 PM  LOS: 5 days   SUBJECTIVE:       Rough night as diarrhea returned, non-bloody.  Events were several hours after eating her soft diet, which had started at lunch yesterday.  Some cramps but not severe pain as before.  No nausea.   OBJECTIVE:         Vital signs in last 24 hours:    Temp:  [98 F (36.7 C)-98.6 F (37 C)] 98.1 F (36.7 C) (04/23 0640) Pulse Rate:  [84-88] 85 (04/23 0640) Resp:  [17-19] 17 (04/23 0640) BP: (133-143)/(49-64) 143/59 mmHg (04/23 0640) SpO2:  [93 %-94 %] 93 % (04/23 0920) Last BM Date: 02/11/15 Filed Weights   02/06/15 1115  Weight: 168 lb (76.204 kg)   General: looks comfortable, anxious.    Heart: RRR Chest: clear bil.   Abdomen: soft, active BS, not tender  Extremities: no CCE Neuro/Psych:  Anxious, alert/oriented x 3.   Intake/Output from previous day: 04/22 0701 - 04/23 0700 In: 840 [P.O.:840] Out: 2 [Urine:2]  Intake/Output this shift:    Lab Results:  Recent Labs  02/09/15 1038 02/10/15 0530 02/11/15 0427  WBC 7.0 5.8 4.8  HGB 9.7* 9.8* 9.4*  HCT 29.8* 29.5* 28.3*  PLT 179 188 202   BMET  Recent Labs  02/09/15 1038 02/10/15 0530 02/11/15 0427  NA 137 136 138  K 3.4* 3.6 3.6  CL 105 105 106  CO2 '26 26 26  '$ GLUCOSE 106* 90 97  BUN 5* <5* <5*  CREATININE 0.75 0.73 0.61  CALCIUM 8.2* 8.0* 8.2*   LFT No results for input(s): PROT, ALBUMIN, AST, ALT, ALKPHOS, BILITOT, BILIDIR, IBILI in the last 72 hours. PT/INR No results for input(s): LABPROT, INR in the last 72 hours. Hepatitis Panel No results for input(s): HEPBSAG, HCVAB, HEPAIGM, HEPBIGM in the last 72 hours.  Studies/Results: No results found.  Scheduled Meds: . amoxicillin-clavulanate  1 tablet Oral Q12H  . docusate sodium  100 mg Oral BID  . folic acid  1 mg Oral Daily  . mometasone-formoterol  2 puff Inhalation BID   Continuous Infusions:  PRN  Meds:.albuterol, hyoscyamine, morphine injection, oxyCODONE-acetaminophen   ASSESMENT:   * Hematochezia, abdominal pain. Suspect ischemic colitis. Low suspicion for concomitant diverticulitis (normal WBCs, no fever). Hgb stable. Stool path panel all negative.  2007 colonoscopy: diverticulosis, mixed rrhoids, no polyps.  Had been much improved as of yesterday evening but she started diarrhea overnight many hours after starting soft diet, I wonder if this is Augmentin S/E rather than the original colitis flaring due to diet?    * Peripheral artery disease, carotid artery stenosis. 81 mg ASA PTA.   * Right LL lung infiltrate on CT. 4 days Zosyn, day 2 Augmentin . Baseline COPD and advanced emphysema.   * Soft tissue densities left pelvis and posterior to rectum. Will ultimately need a colonoscopy and post convalescent CT (say in 6 weeks post recovery from current illness). ? Does she need pelvic ultrasound? (Dr Virgina Jock is PMD, Henrene Pastor is GI)  * Normocytic anemia. S/p 1 unit PRBCs 4/19. Do not have her baseline.   * SLE, chronic Methotrexate.     PLAN   *  D/w Dr Grandville Silos.  Will stop Augmentin, leave soft diet in place and watch her overnight. If diarrhea persists, may need to recheck for C diff given recent abx and inpt germ exposure.  Also if diarrhea persists, could  perform flex sig.   *  Will need to follow up in GI office in ~ 2 to 3 weeks (Dr Henrene Pastor) after discharge and repeat imaging can be decided upon then. Likely will need outpt colonoscopy.     Casey Wilkinson  02/11/2015, 12:14 PM Pager: [phone]

## 2015-02-11 NOTE — Progress Notes (Signed)
[facility] PROGRESS NOTE  Casey Wilkinson WER:[id number] DOB: [birth year] DOA: 02/06/2015 PCP: Precious Reel, MD  Assessment/Plan: #1 GI bleed/rectal bleeding secondary to acute ischemic colitis Likely secondary to ischemic colitis. C. difficile is negative. Patient is afebrile. Patient does not have a leukocytosis. Blood pressure medications on hold. Patient stated that after diet was advanced to a soft diet has been having significant lower abdominal pain and cramping with some diarrhea which has been nonbloody. Ciprofloxacin has been discontinued. Will change her diet back to full liquid diet. Continue current pain management. GI following.  #2 right lower lobe community-acquired pneumonia Noted on CT of the abdomen and pelvis. Patient is currently afebrile. Normal WBC. Continue oral Augmentin.  #3 hypertension Continue to hold antihypertensive agents for now. Follow.  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 Will resume on discharge.  #5 peripheral vascular disease Outpatient follow-up.  #6 acute blood loss anemia Secondary to problem #1. Patient is status post 1 unit packed red blood cells. Hemoglobin currently at 9.4.  #7 COPD Stable.  #8 prophylaxis SCDs for DVT prophylaxis.  Code Status: Full Family Communication: Updated patient and brother and sister-in-law at bedside. Disposition Plan: Home when medically stable, hopefully tomorrow pending GI evaluation.   Consultants:  Gastroenterology: Dr. Hilarie Fredrickson 02/07/2015  Procedures:  CT abdomen and pelvis 02/06/2015  Chest x-ray 02/06/2015  Abdominal films 02/08/2015  Antibiotics:  IV Zosyn 02/07/2015>>>02/10/15  IV ciprofloxacin 02/06/2015>>> 02/07/2015  IV Flagyl 02/06/15 >>>02/07/15  Oral Augmentin 4/22/ 2016  HPI/Subjective: Patient states has been having diarrhea which has been nonbloody. Patient stated last night and this morning had significant abdominal cramping after being started on a soft  diet. Per family patient's abdominal cramping has been on and off throughout the night. Patient denies any nausea or emesis. Patient denies any shortness of breath. No chest pain.  Objective: Filed Vitals:   02/11/15 0640  BP: 143/59  Pulse: 85  Temp: 98.1 F (36.7 C)  Resp: 17    Intake/Output Summary (Last 24 hours) at 02/11/15 1048 Last data filed at 02/11/15 0432  Gross per 24 hour  Intake    360 ml  Output      2 ml  Net    358 ml   Filed Weights   02/06/15 1115  Weight: 76.204 kg (168 lb)    Exam:   General:  NAD  Cardiovascular: RRR  Respiratory: CTAB  Abdomen: Soft, TTP in bilareal lower quadrants, positive bowel sounds, no rebound, no guarding.  Musculoskeletal: No clubbing cyanosis or edema.  Data Reviewed: Basic Metabolic Panel:  Recent Labs Lab 02/06/15 1204 02/08/15 0517 02/09/15 1038 02/10/15 0530 02/11/15 0427  NA 137 139 137 136 138  K 3.7 3.9 3.4* 3.6 3.6  CL 102 108 105 105 106  CO2 '26 24 26 26 26  '$ GLUCOSE 101* 91 106* 90 97  BUN 13 6 5* <5* <5*  CREATININE 0.78 0.80 0.75 0.73 0.61  CALCIUM 9.3 8.3* 8.2* 8.0* 8.2*  MG  --   --  1.8  --   --    Liver Function Tests:  Recent Labs Lab 02/06/15 1204 02/08/15 0517  AST 32 23  ALT 21 13  ALKPHOS 165* 112  BILITOT 0.7 0.9  PROT 8.1 6.5  ALBUMIN 3.3* 2.6*   No results for input(s): LIPASE, AMYLASE in the last 168 hours. No results for input(s): AMMONIA in the last 168 hours. CBC:  Recent Labs Lab 02/06/15 1204  02/07/15 0439 02/07/15 0848 02/08/15 1950  02/09/15 1038 02/10/15 0530 02/11/15 0427  WBC 8.8  --  7.7  --  8.0 7.0 5.8 4.8  NEUTROABS 7.7  --   --   --   --   --   --   --   HGB 10.6*  < > 9.6* 8.7* 10.3* 9.7* 9.8* 9.4*  HCT 32.6*  --  29.4*  --  31.8* 29.8* 29.5* 28.3*  MCV 96.7  --  95.5  --  95.2 95.2 94.2 93.4  PLT 243  --  207  --  186 179 188 202  < > = values in this interval not displayed. Cardiac Enzymes: No results for input(s): CKTOTAL, CKMB,  CKMBINDEX, TROPONINI in the last 168 hours. BNP (last 3 results) No results for input(s): BNP in the last 8760 hours.  ProBNP (last 3 results) No results for input(s): PROBNP in the last 8760 hours.  CBG: No results for input(s): GLUCAP in the last 168 hours.  Recent Results (from the past 240 hour(s))  Clostridium Difficile by PCR     Status: None   Collection Time: 02/07/15  3:36 PM  Result Value Ref Range Status   C difficile by pcr NEGATIVE NEGATIVE Final  Stool culture     Status: None (Preliminary result)   Collection Time: 02/07/15  3:36 PM  Result Value Ref Range Status   Specimen Description STOOL  Final   Special Requests NONE  Final   Culture   Final    NO SUSPICIOUS COLONIES, CONTINUING TO HOLD Performed at [facility]    Report Status PENDING  Incomplete     Studies: No results found.  Scheduled Meds: . amoxicillin-clavulanate  1 tablet Oral Q12H  . docusate sodium  100 mg Oral BID  . folic acid  1 mg Oral Daily  . mometasone-formoterol  2 puff Inhalation BID   Continuous Infusions:    Principal Problem:   Acute ischemic colitis Active Problems:   CAP (community acquired pneumonia)   Essential hypertension   LUPUS ERYTHEMATOSUS, DISCOID   Peripheral vascular disease   Discoid lupus erythematosus   Colitis   Abdominal mass   HTN (hypertension)   Abdominal pain   Blood in stool   Rectal bleeding    Time spent: 40 minutes    [facility] MD [facility] Pager [phone]. If 7PM-7AM, please contact night-coverage at [email or web address], password [facility] 02/11/2015, 10:48 AM  LOS: 5 days

## 2015-02-12 DIAGNOSIS — K921 Melena: Secondary | ICD-10-CM

## 2015-02-12 LAB — BASIC METABOLIC PANEL
ANION GAP: 8 (ref 5–15)
BUN: <5 mg/dL — ABNORMAL LOW (ref 6–23)
CALCIUM: 8.1 mg/dL — AB (ref 8.4–10.5)
CHLORIDE: 102 mmol/L (ref 96–112)
CO2: 26 mmol/L (ref 19–32)
Creatinine, Ser: 0.65 mg/dL (ref 0.50–1.10)
GFR calc Af Amer: >90 mL/min (ref >90)
GFR calc non Af Amer: 85 mL/min — ABNORMAL LOW (ref >90)
Glucose, Bld: 90 mg/dL (ref 70–99)
POTASSIUM: 3.1 mmol/L — AB (ref 3.5–5.1)
SODIUM: 136 mmol/L (ref 135–145)

## 2015-02-12 LAB — CBC
HCT: 28.2 % — ABNORMAL LOW (ref 36.0–46.0)
HEMOGLOBIN: 9.5 g/dL — AB (ref 12.0–15.0)
MCH: 31.9 pg (ref 26.0–34.0)
MCHC: 33.7 g/dL (ref 30.0–36.0)
MCV: 94.6 fL (ref 78.0–100.0)
PLATELETS: 196 10*3/uL (ref 150–400)
RBC: 2.98 MIL/uL — ABNORMAL LOW (ref 3.87–5.11)
RDW: 15.7 % — ABNORMAL HIGH (ref 11.5–15.5)
WBC: 3.8 10*3/uL — ABNORMAL LOW (ref 4.0–10.5)

## 2015-02-12 MED ORDER — ONDANSETRON 4 MG PO TBDP: 4.0000 mg | ORAL_TABLET | Freq: Three times a day (TID) | ORAL | Status: DC | PRN

## 2015-02-12 MED ORDER — HYOSCYAMINE SULFATE 0.125 MG SL SUBL: 0.2500 mg | SUBLINGUAL_TABLET | SUBLINGUAL | Status: DC | PRN

## 2015-02-12 MED ORDER — POTASSIUM CHLORIDE CRYS ER 20 MEQ PO TBCR
40.0000 meq | EXTENDED_RELEASE_TABLET | ORAL | Status: AC
  Administered 2015-02-12 (×2): 40 meq via ORAL
  Filled 2015-02-12 (×2): qty 2

## 2015-02-12 MED ORDER — ASPIRIN 81 MG PO TABS: 81.0000 mg | ORAL_TABLET | Freq: Every day | ORAL | Status: DC

## 2015-02-12 NOTE — Discharge Summary (Signed)
Physician Discharge Summary  Casey Wilkinson:[id number] DOB: 1941-03-26 DOA: 02/06/2015  PCP: Precious Reel, MD  Admit date: 02/06/2015 Discharge date: 02/12/2015  Time spent: 65 minutes  Recommendations for Outpatient Follow- #1 follow-up with Dr. Henrene Pastor gastroenterology in 2-3 weeks. Office will call. Patient will likely need to be scheduled for outpatient colonoscopy for further evaluation. #2 follow-up with Precious Reel, MD in 1 week. On follow-up patient blood pressure need to be reassessed as her blood pressure medications were discontinued secondary to her presentation with acute ischemic colitis. Patient's blood pressure remained well-controlled during the hospitalization. Patient will need a CBC done to check on hemoglobin and a basic metabolic profile done to check on electrolytes and renal function   Discharge Diagnoses:  Principal Problem:   Acute ischemic colitis Active Problems:   CAP (community acquired pneumonia)   Essential hypertension   LUPUS ERYTHEMATOSUS, DISCOID   Peripheral vascular disease   Discoid lupus erythematosus   Colitis   Abdominal mass   HTN (hypertension)   Abdominal pain   Blood in stool   Rectal bleeding   Nausea with vomiting   Discharge Condition: Stable and improved  Diet recommendation: Regular  Filed Weights   02/06/15 1115  Weight: 76.204 kg (168 lb)    History of present illness:  Per Dr Verlon Au 74 y/o ? PAD, CA stenosis, SLE on MTX, COPD, anemia, Former Smoker and quit 2012 [smoker ~35 yrs] Was assisting decrepit husband Around 20:15 02/05/15 p.m. when noticed abdominal pain and discomfort. Abdominal pain was crampy intermittent constant 8/10 Associated with significant diarrhea Had dark stools subsequently around 2130 and then subsequently started passing frank blood No recent use of antibiotics or other issues. Felt dizzy with this and lightheaded on moving around Has had colonoscopy by [facility] gastroenterology~4 years ago  which was according to the patient normal No chest pain associated  Does not take Goody powders or over-the-counter medications + Nausea - vomiting, - prior occurrence, - recent antibiotic use, - chest pain, -blurred vision - Unilateral weakness - fall, - dyspnea, - dysphagia, - chest pain, - joint pain  Emergency room workup revealed Hemoglobin 10.6 [no baseline] BUN/creatinine-13/0.7, alkaline phosphatase 165, albumin 3.3 CT abdomen pelvis = colitis distal transverse proximal colon, soft tissue density inferior to bladder? Soft tissue density posterior to the rectum? Stool  Right lower lobe infiltrate noted    Hospital Course:  #1 GI bleed/rectal bleeding secondary to acute ischemic colitis Likely secondary to ischemic colitis. C. difficile is negative. Patient is afebrile. Patient does not have a leukocytosis. Blood pressure medications were held. Patient was initially placed on bowel rest supportive care IV fluids and the GI consultation obtained. Patient improved clinically had a decrease in bright red blood per rectum. Patient was also placed on Levsin as needed. Patient improved clinically had diet was slowly advanced patient was placed on a soft diet. Patient was also transitioned to oral Augmentin. Patient did have some complaints of nausea and abdominal cramping after starting the oral Augmentin. Oral Augmentin was discontinued patient improved clinically and was back to her baseline. Patient will be discharged on Levsin SL and zofran as needed. Patient by day of discharge was clinically improved and tolerating a solid diet. Patient will follow-up with gastroenterology as outpatient. Patient has been treated appropriately for pneumonia. Patient will be discharged in stable and improved condition. Patient's antihypertensive medications were held secondary to concerns for ischemic colitis and not resumed on discharge. Outpatient follow-up.  #2 right lower lobe community-acquired  pneumonia Noted on CT of the abdomen and pelvis. Patient on admission was placed on IV Zosyn. Patient denied any cough no shortness of breath. Patient was subsequently transitioned to Augmentin and received a total of about 5-6 days of antibiotic therapy which is adequate at this time. Patient remained afebrile throughout the hospitalization did not have any productive cough and a leukocytosis had normalized by day of discharge. Outpatient follow-up.  #3 hypertension Patient was admitted with rectal bleeding and on assessment was noted to have an acute ischemic colitis. Patient's blood pressure medications was subsequently held. Patient's systolic blood pressure remained well controlled in the 130s to 150s. Patient will be discharged off her antihypertensive medications and is to follow-up with PCP one week.   #4 history of discoid lupus Methotrexate was held during the hospitalization and this will be resumed on discharge.  #5 peripheral vascular disease Outpatient follow-up.  #6 acute blood loss anemia Secondary to problem #1. Patient was transfused one unit of packed red blood cells during the hospitalization. Patient's hemoglobin stabilized and was 9.5 by day of discharge. Outpatient follow-up.   #7 COPD Remained stable throughout the hospitalization.    Procedures:  CT abdomen and pelvis 02/06/2015  Chest x-ray 02/06/2015  Abdominal films 02/08/2015  Consultations:  Gastroenterology: Dr. Hilarie Fredrickson 02/07/2015  Discharge Exam: Filed Vitals:   02/12/15 1357  BP: 124/66  Pulse: 74  Temp: 98.3 F (36.8 C)  Resp: 18    General: NAD Cardiovascular: RRR Respiratory: CTAB Abdomen: Soft, nontender, nondistended, positive bowel sounds.  Discharge Instructions   Discharge Instructions    Diet general    Complete by:  As directed      Discharge instructions    Complete by:  As directed   Restart aspirin in 1 week. Follow up with Precious Reel, MD in 1 week. GI office will  call to schedule follow up appointment.     Increase activity slowly    Complete by:  As directed           Current Discharge Medication List    START taking these medications   Details  hyoscyamine (LEVSIN SL) 0.125 MG SL tablet Place 2 tablets (0.25 mg total) under the tongue every 4 (four) hours as needed for cramping. Qty: 30 tablet, Refills: 0    ondansetron (ZOFRAN ODT) 4 MG disintegrating tablet Take 1 tablet (4 mg total) by mouth every 8 (eight) hours as needed for nausea or vomiting. Qty: 20 tablet, Refills: 0      CONTINUE these medications which have CHANGED   Details  aspirin 81 MG tablet Take 1 tablet (81 mg total) by mouth daily. Resume in 1 week. Qty: 30 tablet      CONTINUE these medications which have NOT CHANGED   Details  albuterol (PROVENTIL HFA;VENTOLIN HFA) 108 (90 BASE) MCG/ACT inhaler Inhale 2 puffs into the lungs every 6 (six) hours as needed for wheezing or shortness of breath.     atorvastatin (LIPITOR) 10 MG tablet Take 1 tablet by mouth daily.    Calcium Carbonate-Vitamin D 600-400 MG-UNIT per tablet Take 1 tablet by mouth daily.     docusate sodium (COLACE) 100 MG capsule Take 100 mg by mouth daily.     Fluticasone-Salmeterol (ADVAIR) 250-50 MCG/DOSE AEPB Inhale 1 puff into the lungs daily.     folic acid (FOLVITE) 1 MG tablet Take 1 mg by mouth daily.    Lecithin 1200 MG CAPS Take 1,200 mg by mouth 3 (three) times daily.  Loratadine 5 MG/5ML SOLN Take 10 mLs by mouth at bedtime.    methotrexate (RHEUMATREX) 2.5 MG tablet Take 5-7.5 mg by mouth 2 (two) times a week. Take 7.5 mg on Tuesdays and 5 mg on Wednesdays Caution:Chemotherapy. Protect from light.    Multiple Vitamins-Minerals (ABC PLUS SENIOR) TABS Take by mouth.    Potassium Gluconate 595 MG CAPS Take 1 capsule by mouth daily.    vitamin B-12 (CYANOCOBALAMIN) 500 MCG tablet Take 2,500 mcg by mouth daily.     Vitamins C E 500-400 MG-UNIT CAPS Take 1 tablet by mouth daily.        STOP taking these medications     valsartan-hydrochlorothiazide (DIOVAN-HCT) 160-12.5 MG per tablet        Allergies  Allergen Reactions  . Prochlorperazine Edisylate     **COMPAZINE**   Stroke-like symptoms  . Sulfur     Pt taking Methotrexate, Sulfur drugs could cause SEVERE fatal reaction.   . Doxycycline     hives   Follow-up Information    Follow up with Scarlette Shorts, MD. Schedule an appointment as soon as possible for a visit in 2 weeks.   Specialty:  Gastroenterology   Why:  f/u in 2-3 weeks. Office will call with appointment time.   Contact information:   [address] [address] [phone]       Follow up with Precious Reel, MD. Schedule an appointment as soon as possible for a visit in 1 week.   Specialty:  Internal Medicine   Contact information:   [place] [address] [phone]        The results of significant diagnostics from this hospitalization (including imaging, microbiology, ancillary and laboratory) are listed below for reference.    Significant Diagnostic Studies: Dg Chest 2 View  02/06/2015   CLINICAL DATA:  Chronic shortness of breath.  COPD.  EXAM: CHEST  2 VIEW  COMPARISON:  07/13/2013 and previous  FINDINGS: There is emphysema with lucency in the upper lobes in crowding of markings in the lower lobes. No sign of active infiltrate, lobar collapse or effusion. No bony abnormality. Heart size is normal. No pulmonary edema.  IMPRESSION: Advanced chronic emphysema. Crowding of the lung markings at the bases related to that. No acute process visible.   Electronically Signed   By: Nelson Chimes M.D.   On: 02/06/2015 18:18   Ct Abdomen Pelvis W Contrast  02/06/2015   CLINICAL DATA:  Bright red blood per rectum with abdominal pain  EXAM: CT ABDOMEN AND PELVIS WITH CONTRAST  TECHNIQUE: Multidetector CT imaging of the abdomen and pelvis was performed using the standard protocol following bolus administration of intravenous  contrast.  CONTRAST:  144m OMNIPAQUE IOHEXOL 300 MG/ML  SOLN  COMPARISON:  None.  FINDINGS: Lung bases are well aerated. Focal infiltrate is noted the medial aspect of the right lower lobe.  The liver, gallbladder, spleen, adrenal glands and pancreas are all normal in their CT appearance. The kidneys are well visualized bilaterally with a normal enhancement pattern. The distal transverse colon and proximal descending colon demonstrate wall thickening and edema with pericolonic inflammatory changes consistent with colitis. These changes may be related to diverticulitis although an ischemic etiology cannot be totally excluded. The origins of the mesenteric vessels appear patent.  The bladder is well distended. Soft tissue density is noted inferior to the bladder eccentric to the left measuring 3.0 x 3.9 cm. This extends to the inferior aspect of the bladder but appears  separate from the adjacent colon. Some soft tissue density is noted along the posterior wall of the rectum which may be related to fecal material. This is of uncertain significance. No significant lymphadenopathy is noted.  IMPRESSION: Changes consistent with colitis in the distal transverse and proximal descending colon. This may be related to diverticulitis although the possibility of an ischemic etiology would deserve consideration as well.  Soft tissue density inferior to the bladder and eccentric in the left pelvis as described. This is of uncertain etiology. Correlation with the physical exam is recommended.  Soft tissue density posteriorly in the rectum. Although this may represent fecal material possibility of an underlying mass would deserve consideration and correlation with physical exam is again recommended.  Right lower lobe infiltrate medially.   Electronically Signed   By: Inez Catalina M.D.   On: 02/06/2015 14:55   Dg Abd 2 Views  02/11/2015   CLINICAL DATA:  1 day history of generalized abdominal pain, vomiting and diarrhea. Marked  abdominal distention. Recent diagnosis of ischemic colitis.  EXAM: ABDOMEN - 2 VIEW  COMPARISON:  Two-view abdomen x-ray 02/08/2015. CT abdomen and pelvis 02/06/2015.  FINDINGS: Bowel gas pattern unremarkable without evidence of obstruction or significant ileus. No evidence of free air or significant air-fluid levels on the erect image. Minimal stool burden in the colon. Residual oral contrast material from the CT 5 days ago. Contrast within several sigmoid diverticula.  IMPRESSION: No evidence of bowel obstruction or free intraperitoneal air. Residual contrast material in the colon from the CT 5 days ago indicates prolonged transit time.   Electronically Signed   By: Evangeline Dakin M.D.   On: 02/11/2015 19:49   Dg Abd 2 Views  02/08/2015   CLINICAL DATA:  Mid to lower abdominal pain. Colitis, diverticulitis.  EXAM: ABDOMEN - 2 VIEW  COMPARISON:  CT 02/06/2015  FINDINGS: Nonobstructive bowel gas pattern. Gas throughout nondistended large and small bowel. No free air organomegaly. Oral contrast material noted within portions of the colon.  Probable fibrosis changes within the lung bases.  No effusions.  IMPRESSION: Nonobstructive bowel gas pattern.  No acute findings.   Electronically Signed   By: Rolm Baptise M.D.   On: 02/08/2015 12:39    Microbiology: Recent Results (from the past 240 hour(s))  Clostridium Difficile by PCR     Status: None   Collection Time: 02/07/15  3:36 PM  Result Value Ref Range Status   C difficile by pcr NEGATIVE NEGATIVE Final  Stool culture     Status: None   Collection Time: 02/07/15  3:36 PM  Result Value Ref Range Status   Specimen Description STOOL  Final   Special Requests NONE  Final   Culture   Final    NO SALMONELLA, SHIGELLA, CAMPYLOBACTER, YERSINIA, OR E.COLI 0157:H7 ISOLATED Performed at [facility]    Report Status 02/11/2015 FINAL  Final     Labs: Basic Metabolic Panel:  Recent Labs Lab 02/08/15 0517 02/09/15 1038 02/10/15 0530  02/11/15 0427 02/12/15 0403  NA 139 137 136 138 136  K 3.9 3.4* 3.6 3.6 3.1*  CL 108 105 105 106 102  CO2 '24 26 26 26 26  '$ GLUCOSE 91 106* 90 97 90  BUN 6 5* <5* <5* <5*  CREATININE 0.80 0.75 0.73 0.61 0.65  CALCIUM 8.3* 8.2* 8.0* 8.2* 8.1*  MG  --  1.8  --   --   --    Liver Function Tests:  Recent Labs Lab 02/06/15 1204  02/08/15 0517  AST 32 23  ALT 21 13  ALKPHOS 165* 112  BILITOT 0.7 0.9  PROT 8.1 6.5  ALBUMIN 3.3* 2.6*   No results for input(s): LIPASE, AMYLASE in the last 168 hours. No results for input(s): AMMONIA in the last 168 hours. CBC:  Recent Labs Lab 02/06/15 1204  02/08/15 0517 02/09/15 1038 02/10/15 0530 02/11/15 0427 02/12/15 0403  WBC 8.8  < > 8.0 7.0 5.8 4.8 3.8*  NEUTROABS 7.7  --   --   --   --   --   --   HGB 10.6*  < > 10.3* 9.7* 9.8* 9.4* 9.5*  HCT 32.6*  < > 31.8* 29.8* 29.5* 28.3* 28.2*  MCV 96.7  < > 95.2 95.2 94.2 93.4 94.6  PLT 243  < > 186 179 188 202 196  < > = values in this interval not displayed. Cardiac Enzymes: No results for input(s): CKTOTAL, CKMB, CKMBINDEX, TROPONINI in the last 168 hours. BNP: BNP (last 3 results) No results for input(s): BNP in the last 8760 hours.  ProBNP (last 3 results) No results for input(s): PROBNP in the last 8760 hours.  CBG: No results for input(s): GLUCAP in the last 168 hours.     SignedIrine Seal MD [facility] 02/12/2015, 3:31 PM

## 2015-02-12 NOTE — Progress Notes (Signed)
Daily Rounding Note  02/12/2015, 9:00 AM  LOS: 6 days   SUBJECTIVE:       Only 2 soft, unformed stools  Yesterday, no blood.  Some pain in left abdomen as she ate AM eggs.  Last a few minutes and now gone, did not use anelgesics for this non-severe pain.  OBJECTIVE:         Vital signs in last 24 hours:    Temp:  [98 F (36.7 C)-98.4 F (36.9 C)] 98.1 F (36.7 C) (04/24 0541) Pulse Rate:  [70-88] 72 (04/24 0541) Resp:  [17-18] 18 (04/24 0541) BP: (127-146)/(55-67) 127/63 mmHg (04/24 0541) SpO2:  [92 %-96 %] 96 % (04/24 0541) Last BM Date: 12-Mar-2015 Filed Weights   02/06/15 1115  Weight: 168 lb (76.204 kg)   General: looks well.  comfortable   Heart: RRR Chest: clear but diminished BS, clearing throat alot Abdomen: soft, ND, minimal if any tenderness on left.  BS slightly  hyopoactive  Extremities: no CCE Neuro/Psych:  Pleasant, alert/oriented fully.    Intake/Output from previous day: 12-Mar-2023 0701 - 04/24 0700 In: 1080 [P.O.:1080] Out: 2 [Urine:2]  Intake/Output this shift:    Lab Results:  Recent Labs  02/10/15 0530 03/12/15 0427 02/12/15 0403  WBC 5.8 4.8 3.8*  HGB 9.8* 9.4* 9.5*  HCT 29.5* 28.3* 28.2*  PLT 188 202 196   BMET  Recent Labs  02/10/15 0530 12-Mar-2015 0427 02/12/15 0403  NA 136 138 136  K 3.6 3.6 3.1*  CL 105 106 102  CO2 '26 26 26  '$ GLUCOSE 90 97 90  BUN <5* <5* <5*  CREATININE 0.73 0.61 0.65  CALCIUM 8.0* 8.2* 8.1*   LFT No results for input(s): PROT, ALBUMIN, AST, ALT, ALKPHOS, BILITOT, BILIDIR, IBILI in the last 72 hours. PT/INR No results for input(s): LABPROT, INR in the last 72 hours. Hepatitis Panel No results for input(s): HEPBSAG, HCVAB, HEPAIGM, HEPBIGM in the last 72 hours.  Studies/Results: Dg Abd 2 Views  03-12-15   CLINICAL DATA:  1 day history of generalized abdominal pain, vomiting and diarrhea. Marked abdominal distention. Recent diagnosis of ischemic  colitis.  EXAM: ABDOMEN - 2 VIEW  COMPARISON:  Two-view abdomen x-ray 02/08/2015. CT abdomen and pelvis 02/06/2015.  FINDINGS: Bowel gas pattern unremarkable without evidence of obstruction or significant ileus. No evidence of free air or significant air-fluid levels on the erect image. Minimal stool burden in the colon. Residual oral contrast material from the CT 5 days ago. Contrast within several sigmoid diverticula.  IMPRESSION: No evidence of bowel obstruction or free intraperitoneal air. Residual contrast material in the colon from the CT 5 days ago indicates prolonged transit time.   Electronically Signed   By: Evangeline Dakin M.D.   On: 12-Mar-2015 19:49    ASSESMENT:   * Hematochezia, abdominal pain. Suspect ischemic colitis. Low suspicion for concomitant diverticulitis (normal WBCs, no fever). Hgb stable. Stool path panel all negative.  2007 colonoscopy: diverticulosis, mixed rrhoids, no polyps.  Urgent, loose stools improved off Augmentin.  Still some lingering, this AM was post prandial, pain/discomfort.   * Peripheral artery disease, carotid artery stenosis. 81 mg ASA PTA.   * Right LL lung infiltrate on CT. 4 days Zosyn, day 2 Augmentin . Baseline COPD and advanced emphysema.   * Soft tissue densities left pelvis and posterior to rectum. Will ultimately need a colonoscopy and post convalescent CT (say in 6 weeks post recovery from current illness). ?  Does she need pelvic ultrasound? (Dr Virgina Jock is PMD, Henrene Pastor is GI)  * Normocytic anemia. S/p 1 unit PRBCs 4/19. Do not have her baseline.   * SLE, chronic Methotrexate.    PLAN   *  If tolerates lunch without issues, can discharge home. GI will arrange follow up. Will need to follow up in GI office in ~ 2 to 3 weeks (Dr Henrene Pastor) after discharge and repeat imaging can be decided upon then. Likely will need outpt colonoscopy.     Casey Wilkinson  02/12/2015, 9:00 AM Pager: [phone]

## 2015-02-13 ENCOUNTER — Telehealth: Payer: Self-pay | Admitting: Internal Medicine

## 2015-02-13 ENCOUNTER — Telehealth: Payer: Self-pay

## 2015-02-13 NOTE — Telephone Encounter (Signed)
Pt scheduled to see Nicoletta Ba PA 02/28/15'@10am'$ .

## 2015-02-13 NOTE — Telephone Encounter (Signed)
Should have gone home with Levsin

## 2015-02-13 NOTE — Telephone Encounter (Signed)
Pt states she went home from the hospital yesterday. States that now she is having sharp stabbing pains that come and go in her left side. States this started this morning. Pt wants to know if we can get her something to help with this pain. Please advise. Pt has OV scheduled with Amy Esterwood PA 02/28/15.

## 2015-02-13 NOTE — Telephone Encounter (Signed)
-----   Message from Jerene Bears, MD sent at 02/10/2015  5:46 PM EDT ----- Pt of [facility]'s Needs outpatient visit within 2 weeks.  Repeat CBC Will eventually need colonoscopy

## 2015-02-13 NOTE — Telephone Encounter (Signed)
Pt states she took this but it did not help much. Discussed with pt that she should take 2 every 4 hours as needed to see if this helps. Pt to call back if this does not help with her discomfort.

## 2015-02-20 DIAGNOSIS — E876 Hypokalemia: Secondary | ICD-10-CM | POA: Diagnosis not present

## 2015-02-20 DIAGNOSIS — J449 Chronic obstructive pulmonary disease, unspecified: Secondary | ICD-10-CM | POA: Diagnosis not present

## 2015-02-20 DIAGNOSIS — J189 Pneumonia, unspecified organism: Secondary | ICD-10-CM | POA: Diagnosis not present

## 2015-02-20 DIAGNOSIS — I10 Essential (primary) hypertension: Secondary | ICD-10-CM | POA: Diagnosis not present

## 2015-02-20 DIAGNOSIS — L930 Discoid lupus erythematosus: Secondary | ICD-10-CM | POA: Diagnosis not present

## 2015-02-20 DIAGNOSIS — Z6827 Body mass index (BMI) 27.0-27.9, adult: Secondary | ICD-10-CM | POA: Diagnosis not present

## 2015-02-20 DIAGNOSIS — K529 Noninfective gastroenteritis and colitis, unspecified: Secondary | ICD-10-CM | POA: Diagnosis not present

## 2015-02-20 DIAGNOSIS — M3500 Sicca syndrome, unspecified: Secondary | ICD-10-CM | POA: Diagnosis not present

## 2015-02-20 DIAGNOSIS — I878 Other specified disorders of veins: Secondary | ICD-10-CM | POA: Diagnosis not present

## 2015-02-20 DIAGNOSIS — D649 Anemia, unspecified: Secondary | ICD-10-CM | POA: Diagnosis not present

## 2015-02-24 ENCOUNTER — Encounter: Payer: Self-pay | Admitting: Family

## 2015-02-27 ENCOUNTER — Ambulatory Visit (HOSPITAL_COMMUNITY)
Admission: RE | Admit: 2015-02-27 | Discharge: 2015-02-27 | Disposition: A | Discharge status: Home / Self Care | Payer: Medicare Other | Source: Ambulatory Visit | Attending: Family | Admitting: Family

## 2015-02-27 ENCOUNTER — Ambulatory Visit (INDEPENDENT_AMBULATORY_CARE_PROVIDER_SITE_OTHER): Payer: Medicare Other | Admitting: Family

## 2015-02-27 ENCOUNTER — Encounter: Payer: Self-pay | Admitting: Family

## 2015-02-27 ENCOUNTER — Other Ambulatory Visit: Payer: Self-pay | Admitting: Family

## 2015-02-27 VITALS — BP 131/74 | HR 76 | Resp 16 | Ht 65.5 in | Wt 160.0 lb

## 2015-02-27 DIAGNOSIS — I6523 Occlusion and stenosis of bilateral carotid arteries: Secondary | ICD-10-CM | POA: Diagnosis not present

## 2015-02-27 DIAGNOSIS — I739 Peripheral vascular disease, unspecified: Secondary | ICD-10-CM | POA: Insufficient documentation

## 2015-02-27 DIAGNOSIS — Z87891 Personal history of nicotine dependence: Secondary | ICD-10-CM

## 2015-02-27 NOTE — Progress Notes (Signed)
[facility] [place] HISTORY AND PHYSICAL   MRN : [id number]  History of Present Illness:   Casey Wilkinson is a 74 y.o. female patient of Dr. Oneida Alar who has a history of mild PAD and carotid artery stenosis. She returns today for follow up. CT scan of the neck earlier in 2015 showed a 60% right internal carotid artery stenosis with minimal stenosis left internal carotid artery.  The patient denies any history of stroke or TIA symptoms. She denies any known heart problems. She was hospitalized at [facility] for 6 days, released in April 2016, for diverticulitis and colitis, Dr. Germain Osgood is her GI.   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 Pt states she has no claudication symptoms with walking, no non healing wounds, no barriers to walking.  Pt Diabetic: No Pt smoker: former smoker, quit in 2000  Pt meds include: Statin :Yes ASA: Yes Other anticoagulants/antiplatelets: no   Current Outpatient Prescriptions  Medication Sig Dispense Refill  . albuterol (PROVENTIL HFA;VENTOLIN HFA) 108 (90 BASE) MCG/ACT inhaler Inhale 2 puffs into the lungs every 6 (six) hours as needed for wheezing or shortness of breath.     Marland Kitchen aspirin 81 MG tablet Take 1 tablet (81 mg total) by mouth daily. Resume in 1 week. 30 tablet   . atorvastatin (LIPITOR) 10 MG tablet Take 1 tablet by mouth daily.    . Calcium Carbonate-Vitamin D 600-400 MG-UNIT per tablet Take 1 tablet by mouth daily.     Marland Kitchen docusate sodium (COLACE) 100 MG capsule Take 100 mg by mouth daily.     . Fluticasone-Salmeterol (ADVAIR) 250-50 MCG/DOSE AEPB Inhale 1 puff into the lungs daily.     . folic acid (FOLVITE) 1 MG tablet Take 1 mg by mouth daily.    . hyoscyamine (LEVSIN SL) 0.125 MG SL tablet Place 2 tablets (0.25 mg total) under the  tongue every 4 (four) hours as needed for cramping. 30 tablet 0  . Lecithin 1200 MG CAPS Take 1,200 mg by mouth 3 (three) times daily.    . Loratadine 5 MG/5ML SOLN Take 10 mLs by mouth at bedtime.    . methotrexate (RHEUMATREX) 2.5 MG tablet Take 5-7.5 mg by mouth 2 (two) times a week. Take 7.5 mg on Tuesdays and 5 mg on Wednesdays Caution:Chemotherapy. Protect from light.    . Multiple Vitamins-Minerals (ABC PLUS SENIOR) TABS Take by mouth.    . ondansetron (ZOFRAN ODT) 4 MG disintegrating tablet Take 1 tablet (4 mg total) by mouth every 8 (eight) hours as needed for nausea or vomiting. 20 tablet 0  . Potassium Gluconate 595 MG CAPS Take 1 capsule by mouth daily.    . valsartan-hydrochlorothiazide (DIOVAN-HCT) 160-12.5 MG per tablet     . vitamin B-12 (CYANOCOBALAMIN) 500 MCG tablet Take 2,500 mcg by mouth daily.     . Vitamins C E 500-400 MG-UNIT CAPS Take 1 tablet by mouth daily.      No current facility-administered medications for this visit.    Past Medical History  Diagnosis Date  . Discoid lupus erythematosus   . Hypertension   . COPD (chronic obstructive pulmonary disease)   . Anemia   . Colitis April 2016  . Diverticulitis April 2016    and Colitis    Social History History  Substance Use Topics  . Smoking status: Former Smoker -- 0.50 packs/day for 50 years    Types: Cigarettes    Quit date: 12/05/2000  . Smokeless tobacco: Never Used     Comment: started smoking in early 20s  . Alcohol Use: No    Family History Family History  Problem Relation Age of Onset  . Heart disease Father     Before age 51  . Hyperlipidemia Father   . Hypertension Father   . Pneumonia Mother   . Alzheimer's disease Mother     Surgical History Past Surgical History  Procedure Laterality Date  . Total vaginal hysterectomy  1968  . Hemmroids  2006  . Recocele/entracele  2006  . Abdominal hysterectomy  1968    Allergies  Allergen Reactions  . Prochlorperazine Edisylate      **COMPAZINE**   Stroke-like symptoms  . Sulfur     Pt taking Methotrexate, Sulfur drugs could cause SEVERE fatal reaction.   . Doxycycline     hives    Current Outpatient Prescriptions  Medication Sig Dispense Refill  . albuterol (PROVENTIL HFA;VENTOLIN HFA) 108 (90 BASE) MCG/ACT inhaler Inhale 2 puffs into the lungs every 6 (six) hours as needed for wheezing or shortness of breath.     Marland Kitchen aspirin 81 MG tablet Take 1 tablet (81 mg total) by mouth daily. Resume in 1 week. 30 tablet   . atorvastatin (LIPITOR) 10 MG tablet Take 1 tablet by mouth daily.    . Calcium Carbonate-Vitamin D 600-400 MG-UNIT per tablet Take 1 tablet by mouth daily.     Marland Kitchen docusate sodium (COLACE) 100 MG capsule Take 100 mg by mouth daily.     . Fluticasone-Salmeterol (ADVAIR) 250-50 MCG/DOSE AEPB Inhale 1 puff into the lungs daily.     . folic acid (FOLVITE) 1 MG tablet Take 1 mg by mouth daily.    . hyoscyamine (LEVSIN SL) 0.125 MG SL tablet Place 2 tablets (0.25 mg total) under the tongue every 4 (four) hours as needed for cramping. 30 tablet 0  . Lecithin 1200 MG CAPS Take 1,200 mg by mouth 3 (three) times daily.    . Loratadine 5 MG/5ML SOLN Take 10 mLs by mouth at bedtime.    . methotrexate (RHEUMATREX) 2.5 MG tablet Take 5-7.5 mg by mouth 2 (two) times a week. Take 7.5 mg on Tuesdays and 5 mg on Wednesdays Caution:Chemotherapy. Protect from light.    . Multiple Vitamins-Minerals (ABC PLUS SENIOR) TABS Take by mouth.    . ondansetron (ZOFRAN ODT) 4 MG disintegrating tablet Take 1 tablet (4 mg total) by mouth every 8 (eight) hours as needed for nausea or vomiting. 20 tablet 0  . Potassium Gluconate 595 MG CAPS Take 1 capsule by mouth daily.    . valsartan-hydrochlorothiazide (DIOVAN-HCT) 160-12.5 MG per tablet     . vitamin B-12 (CYANOCOBALAMIN) 500 MCG tablet Take 2,500 mcg by mouth daily.     . Vitamins C E 500-400 MG-UNIT CAPS Take 1 tablet by mouth daily.      No current facility-administered medications for  this visit.     REVIEW OF SYSTEMS: See HPI for pertinent positives and negatives.  Physical Examination Filed Vitals:   02/27/15 1542  BP: 131/74  Pulse: 76  Resp: 16  Height: 5' 5.5" (1.664 m)  Weight: 160 lb (72.576 kg)  SpO2: 97%   Body mass index is 26.21 kg/(m^2).  General: WDWN in NAD Gait: Normal HENT: WNL Eyes: Pupils equal Pulmonary: normal non-labored  breathing , without Rales, rhonchi, wheezing Cardiac: RRR, no murmur detected  Abdomen: soft, NT, no masses palpated Skin: no rashes, ulcers noted; no Gangrene , no cellulitis; no open wounds.   VASCULAR EXAM  Carotid Bruits Right Left   Negative Negative  radial pulses are 2+ palpable and = Aorta is not palpable   VASCULAR EXAM: Extremities without ischemic changes  without Gangrene; without open wounds.     LE Pulses Right Left   FEMORAL 2+ palpable 2+ palpable    POPLITEAL not palpable  not palpable   POSTERIOR TIBIAL not palpable  not palpable    DORSALIS PEDIS  ANTERIOR TIBIAL 1+ palpable  1+ palpable     Musculoskeletal: no muscle wasting or atrophy; no peripheral edema Neurologic: A&O X 3; Appropriate Affect ;  SENSATION: normal; MOTOR FUNCTION: 5/5 Symmetric, CN 2-12 intact Speech is fluent/normal         Non-Invasive Vascular Imaging (02/27/2015):  <40% bilateral ICA stenosis, no significant change from 08/30/14  ASSESSMENT:  Casey Wilkinson is a 74 y.o. female with mild bilateral ICA stenosis and minimal PAD. CT scan of the neck earlier in 2015 showed a 60% right internal carotid artery stenosis with minimal stenosis left internal carotid artery.  Since the above CT scan, two serial carotid Duplex have demonstrated <40% bilateral ICA stenoses.  She has no  history of stroke. ABI's are normal, TBI's are below normal; she has no claudiciation symptoms, no tissue loss.   PLAN:   Graduated walking program.  Based on today's exam and non-invasive vascular lab results, the patient will follow up in 1 year with the following tests:carotid Duplex and ABI's. I discussed in depth with the patient the nature of atherosclerosis, and emphasized the importance of maximal medical management including strict control of blood pressure, blood glucose, and lipid levels, obtaining regular exercise, and cessation of smoking.  The patient is aware that without maximal medical management the underlying atherosclerotic disease process will progress, limiting the benefit of any interventions.  The patient was given information about stroke prevention and what symptoms should prompt the patient to seek immediate medical care.  The patient was given information about PAD including signs, symptoms, treatment, what symptoms should prompt the patient to seek immediate medical care, and risk reduction measures to take. Thank you for allowing [place] to participate in this patient's care.  Clemon Chambers, RN, MSN, FNP-C [facility] Office: [phone]  Clinic MD: Trula Slade  02/27/2015 3:58 PM

## 2015-02-27 NOTE — Patient Instructions (Signed)
Stroke Prevention Some medical conditions and behaviors are associated with an increased chance of having a stroke. You may prevent a stroke by making healthy choices and managing medical conditions. HOW CAN I REDUCE MY RISK OF HAVING A STROKE?   Stay physically active. Get at least 30 minutes of activity on most or all days.  Do not smoke. It may also be helpful to avoid exposure to secondhand smoke.  Limit alcohol use. Moderate alcohol use is considered to be:  No more than 2 drinks per day for men.  No more than 1 drink per day for nonpregnant women.  Eat healthy foods. This involves:  Eating 5 or more servings of fruits and vegetables a day.  Making dietary changes that address high blood pressure (hypertension), high cholesterol, diabetes, or obesity.  Manage your cholesterol levels.  Making food choices that are high in fiber and low in saturated fat, trans fat, and cholesterol may control cholesterol levels.  Take any prescribed medicines to control cholesterol as directed by your health care provider.  Manage your diabetes.  Controlling your carbohydrate and sugar intake is recommended to manage diabetes.  Take any prescribed medicines to control diabetes as directed by your health care provider.  Control your hypertension.  Making food choices that are low in salt (sodium), saturated fat, trans fat, and cholesterol is recommended to manage hypertension.  Take any prescribed medicines to control hypertension as directed by your health care provider.  Maintain a healthy weight.  Reducing calorie intake and making food choices that are low in sodium, saturated fat, trans fat, and cholesterol are recommended to manage weight.  Stop drug abuse.  Avoid taking birth control pills.  Talk to your health care provider about the risks of taking birth control pills if you are over 35 years old, smoke, get migraines, or have ever had a blood clot.  Get evaluated for sleep  disorders (sleep apnea).  Talk to your health care provider about getting a sleep evaluation if you snore a lot or have excessive sleepiness.  Take medicines only as directed by your health care provider.  For some people, aspirin or blood thinners (anticoagulants) are helpful in reducing the risk of forming abnormal blood clots that can lead to stroke. If you have the irregular heart rhythm of atrial fibrillation, you should be on a blood thinner unless there is a good reason you cannot take them.  Understand all your medicine instructions.  Make sure that other conditions (such as anemia or atherosclerosis) are addressed. SEEK IMMEDIATE MEDICAL CARE IF:   You have sudden weakness or numbness of the face, arm, or leg, especially on one side of the body.  Your face or eyelid droops to one side.  You have sudden confusion.  You have trouble speaking (aphasia) or understanding.  You have sudden trouble seeing in one or both eyes.  You have sudden trouble walking.  You have dizziness.  You have a loss of balance or coordination.  You have a sudden, severe headache with no known cause.  You have new chest pain or an irregular heartbeat. Any of these symptoms may represent a serious problem that is an emergency. Do not wait to see if the symptoms will go away. Get medical help at once. Call your local emergency services (911 in [place] Do not drive yourself to the hospital. Document Released: 11/14/2004 Document Revised: 02/21/2014 Document Reviewed: 04/09/2013 ExitCare Patient Information 2015 [employer]. This information is not intended to replace advice given   to you by your health care provider. Make sure you discuss any questions you have with your health care provider.  

## 2015-02-28 ENCOUNTER — Other Ambulatory Visit: Payer: Self-pay | Admitting: *Deleted

## 2015-02-28 ENCOUNTER — Encounter: Payer: Self-pay | Admitting: Physician Assistant

## 2015-02-28 ENCOUNTER — Ambulatory Visit (INDEPENDENT_AMBULATORY_CARE_PROVIDER_SITE_OTHER): Payer: Medicare Other | Admitting: Physician Assistant

## 2015-02-28 VITALS — BP 118/50 | HR 80 | Ht 65.5 in | Wt 161.0 lb

## 2015-02-28 DIAGNOSIS — D62 Acute posthemorrhagic anemia: Secondary | ICD-10-CM

## 2015-02-28 DIAGNOSIS — K559 Vascular disorder of intestine, unspecified: Secondary | ICD-10-CM

## 2015-02-28 DIAGNOSIS — M7551 Bursitis of right shoulder: Secondary | ICD-10-CM | POA: Diagnosis not present

## 2015-02-28 DIAGNOSIS — I6523 Occlusion and stenosis of bilateral carotid arteries: Secondary | ICD-10-CM

## 2015-02-28 DIAGNOSIS — K5730 Diverticulosis of large intestine without perforation or abscess without bleeding: Secondary | ICD-10-CM

## 2015-02-28 DIAGNOSIS — I739 Peripheral vascular disease, unspecified: Secondary | ICD-10-CM

## 2015-02-28 DIAGNOSIS — M7501 Adhesive capsulitis of right shoulder: Secondary | ICD-10-CM | POA: Diagnosis not present

## 2015-02-28 MED ORDER — MOVIPREP 100 G PO SOLR
1.0000 | ORAL | Status: DC
Start: 2015-02-28 — End: 2015-04-13

## 2015-02-28 NOTE — Progress Notes (Signed)
Patient ID: JAYLEENE GLAESER, female   DOB: March 24, 1941, 74 y.o.   MRN: [id number]   Subjective:    Patient ID: Ralph Leyden, female    DOB: 1941-05-08, 74 y.o.   MRN: [id number]  HPI Ranay is a very nice 74 year old white female known to Dr. Henrene Pastor from prior colonoscopies. She had a recent hospitalization for 18 2016 with acute abdominal pain and bloody stools. Add CT scan of the abdomen and pelvis which did show an acute colitis from the transverse to the descending colon. She was seen in consultation by Dr. Hilarie Fredrickson and her is felt most consistent with acute segmental ischemic colitis. She also had a community acquired pneumonia Other medical problems include COPD Lupus, peripheral vascular disease, hypertension, and diverticulosis. She comes to the office today for post hospital follow-up. She has been seen by her PCP Dr. Virgina Jock since hospitalization and says she had her labs checked in her hemoglobin has improved. Hemoglobin was 9.5 on discharge from the hospital. Last colonoscopy was done in 2007 showing internal and external hemorrhoids diverticulosis and no polyps. Patient says she feels much better and is not having any residual abdominal pain or cramping. She has not had any bleeding since she left the hospital. She says she is still feels somewhat weak, but much better than she did while hospitalized.  Review of Systems Pertinent positive and negative review of systems were noted in the above HPI section.  All other review of systems was otherwise negative.  Outpatient Encounter Prescriptions as of 02/28/2015  Medication Sig  . albuterol (PROVENTIL HFA;VENTOLIN HFA) 108 (90 BASE) MCG/ACT inhaler Inhale 2 puffs into the lungs every 6 (six) hours as needed for wheezing or shortness of breath.   Marland Kitchen aspirin 81 MG tablet Take 1 tablet (81 mg total) by mouth daily. Resume in 1 week.  Marland Kitchen atorvastatin (LIPITOR) 10 MG tablet Take 1 tablet by mouth daily.  . Calcium Carbonate-Vitamin D 600-400  MG-UNIT per tablet Take 1 tablet by mouth daily.   Marland Kitchen docusate sodium (COLACE) 100 MG capsule Take 100 mg by mouth daily.   . Fluticasone-Salmeterol (ADVAIR) 250-50 MCG/DOSE AEPB Inhale 1 puff into the lungs daily.   . folic acid (FOLVITE) 1 MG tablet Take 1 mg by mouth daily.  . hyoscyamine (LEVSIN SL) 0.125 MG SL tablet Place 2 tablets (0.25 mg total) under the tongue every 4 (four) hours as needed for cramping.  . Lecithin 1200 MG CAPS Take 1,200 mg by mouth 3 (three) times daily.  . Loratadine 5 MG/5ML SOLN Take 10 mLs by mouth at bedtime.  . methotrexate (RHEUMATREX) 2.5 MG tablet Take 5-7.5 mg by mouth 2 (two) times a week. Take 7.5 mg on Tuesdays and 5 mg on Wednesdays Caution:Chemotherapy. Protect from light.  . Multiple Vitamins-Minerals (ABC PLUS SENIOR) TABS Take by mouth.  . ondansetron (ZOFRAN ODT) 4 MG disintegrating tablet Take 1 tablet (4 mg total) by mouth every 8 (eight) hours as needed for nausea or vomiting.  . Potassium Gluconate 595 MG CAPS Take 1 capsule by mouth daily.  . valsartan-hydrochlorothiazide (DIOVAN-HCT) 160-12.5 MG per tablet Take 1 tablet by mouth daily.   . vitamin B-12 (CYANOCOBALAMIN) 500 MCG tablet Take 2,500 mcg by mouth daily.   . Vitamins C E 500-400 MG-UNIT CAPS Take 1 tablet by mouth daily.   Marland Kitchen MOVIPREP 100 G SOLR Take 1 kit (200 g total) by mouth as directed.   No facility-administered encounter medications on file as of 02/28/2015.   Allergies  Allergen Reactions  . Prochlorperazine Edisylate     **COMPAZINE**   Stroke-like symptoms  . Sulfur     Pt taking Methotrexate, Sulfur drugs could cause SEVERE fatal reaction.   . Doxycycline     hives   Patient Active Problem List   Diagnosis Date Noted  . Nausea with vomiting   . CAP (community acquired pneumonia) 02/08/2015  . Acute ischemic colitis   . Rectal bleeding   . Abdominal pain   . Blood in stool   . 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 Colitis 02/06/2015  . Abdominal mass  02/06/2015  . HTN (hypertension) 02/06/2015  . Peripheral vascular disease 02/24/2014  . LUPUS ERYTHEMATOSUS, DISCOID 11/21/2009  . Essential hypertension 12/08/2008  . COPD, mild-Smoker till 2012 12/08/2008   History   Social History  . Marital Status: Married    Spouse Name: N/A  . Number of Children: 2  . Years of Education: N/A   Occupational History  . retired     Secretary/receptionist [phone]   Social History Main Topics  . Smoking status: Former Smoker -- 0.50 packs/day for 50 years    Types: Cigarettes    Quit date: 12/05/2000  . Smokeless tobacco: Never Used     Comment: started smoking in early 20s  . Alcohol Use: No  . Drug Use: No  . Sexual Activity: Not on file   Other Topics Concern  . Not on file   Social History Narrative    Ms. Dai's family history includes Alzheimer's disease in her mother; Heart disease in her father; Hyperlipidemia in her father; Hypertension in her father; Pneumonia in her mother.      Objective:    Filed Vitals:   02/28/15 1000  BP: 118/50  Pulse: 80    Physical Exam   well-developed elderly white female in no acute distress, very pleasant blood pressure 118/50 pulse 80 height 5 foot 5 weight 161. HEENT; nontraumatic normocephalic EOMI PERRLA sclera anicteric, Supple; no JVD, Cardiovascular; regular rate and rhythm with S1-S2 no murmur rub or gallop, Pulmonary ;clear bilaterally, Abdomen ;soft bowel sounds are present nontender is no palpable mass or hepatosplenomegaly, Rectal; exam not done, Extremities; patient is holding her right forearm and says she is having pain in her right shoulder, Neuropsych; mood and affect appropriate      Assessment & Plan:   #1  74yo female with recent hospitalization with acute colitis-presumed segmental ischemic colitis with abdominal  pain and bloody stools- sxs have resolved ( also had a CAP) #2 anemia secondary to blood loss #3 Discoid lupus #4 COP #5 HTN #6PVD   Plan;will obtain  copies of recent labs -follow up anemia Schedule for colonoscopy with Dr. PHenrene Pastor. Procedure discussed in detail with pt and she is agreeable to proceed AAlfredia FergusonPA-C 02/28/2015   Cc: RShon Baton MD

## 2015-02-28 NOTE — Patient Instructions (Signed)
You have been scheduled for a colonoscopy. Please follow written instructions given to you at your visit today.  Please pick up your prep supplies at the pharmacy within the next 1-3 days. [facility] [address] If you use inhalers (even only as needed), please bring them with you on the day of your procedure. Your physician has requested that you go to www.startemmi.com and enter the access code given to you at your visit today. This web site gives a general overview about your procedure. However, you should still follow specific instructions given to you by our office regarding your preparation for the procedure.

## 2015-02-28 NOTE — Progress Notes (Signed)
Agree with initial assessment and plans as outlined 

## 2015-03-07 ENCOUNTER — Ambulatory Visit: Payer: Medicare Other | Attending: Orthopaedic Surgery | Admitting: Physical Therapy

## 2015-03-07 DIAGNOSIS — M25611 Stiffness of right shoulder, not elsewhere classified: Secondary | ICD-10-CM

## 2015-03-07 DIAGNOSIS — R293 Abnormal posture: Secondary | ICD-10-CM | POA: Diagnosis not present

## 2015-03-07 DIAGNOSIS — M25511 Pain in right shoulder: Secondary | ICD-10-CM | POA: Diagnosis not present

## 2015-03-07 DIAGNOSIS — M25619 Stiffness of unspecified shoulder, not elsewhere classified: Secondary | ICD-10-CM

## 2015-03-07 DIAGNOSIS — R29898 Other symptoms and signs involving the musculoskeletal system: Secondary | ICD-10-CM | POA: Insufficient documentation

## 2015-03-07 NOTE — Patient Instructions (Signed)
Arm: External Rotation   Stand facing tubing at elbow height, elbow at side, forearm across body. Pull forearm away from body as far as possible, keeping elbow at side. Move slowly and deliberately. Repeat 10  times. Do 2 sessions per day.  Copyright  VHI. All rights reserved.   Shoulder Internal Rotation   Sit with elbow at side, forearm out from side holding tubing, with towel between elbow and ribs. Rotate arm across body. Keep elbow bent at right angle. Anchor to side, mid-chest. Do 10  sets of 10 repetitions. (you don't have to sit on a physioball, can be done standing) Copyright  VHI. All rights reserved.         Protraction: Push-Up (Wall)   Lean to wall, feet flat, arms bent, trunk straight, hands directly in front of shoulders, thumbs facing each other. Push to straight arms. Repeat 10  times per set. Do 2  sets per session. Do 7 sessions per week.  Posterior Capsule Sleeper Stretch, Side-Lying (Home) Retraction: Row - Bilateral (Anchor)   Facing anchor, arms reaching forward, pull hands toward stomach, pinching shoulder blades together. Repeat 10 times per set. Do 2 sets per session. Do 5 sessions per week. Use red theraband.  Copyright  VHI. All rights reserved.   Starr Lake PT, DPT, LAT, ATC  [facility] Phone: [phone]        Copyright  VHI. All rights reserved.

## 2015-03-07 NOTE — Therapy (Signed)
[place], [place], [address] Phone: [phone]   Fax:  [phone]  Physical Therapy Evaluation  Patient Details  Name: Casey Wilkinson MRN: [id number] Date of Birth: Apr 15, 1941 Referring Provider:  Marybelle Killings, MD  Encounter Date: 03/07/2015      PT End of Session - 03/07/15 1720    Visit Number 1   Number of Visits 12   Date for PT Re-Evaluation 04/18/15   PT Start Time 1500   PT Stop Time 1545   PT Time Calculation (min) 45 min   Activity Tolerance Patient tolerated treatment well   Behavior During Therapy [facility] for tasks assessed/performed      Past Medical History  Diagnosis Date  . Discoid lupus erythematosus   . Hypertension   . COPD (chronic obstructive pulmonary disease)   . Anemia   . Colitis April 2016  . Diverticulitis April 2016    and Colitis    Past Surgical History  Procedure Laterality Date  . Total vaginal hysterectomy  1968  . Hemmroids  2006  . Recocele/entracele  2006  . Abdominal hysterectomy  1968    There were no vitals filed for this visit.  Visit Diagnosis:  Right shoulder pain - Plan: PT plan of care cert/re-cert  Stiffness of right shoulder joint - Plan: PT plan of care cert/re-cert  Decreased range of motion (ROM) of shoulder - Plan: PT plan of care cert/re-cert  Weakness of shoulder - Plan: PT plan of care cert/re-cert  Abnormal posture - Plan: PT plan of care cert/re-cert      Subjective Assessment - 03/07/15 1508    Subjective pt is a 74 y.o F with CC of R shoulder pain that started 3 weeks ago that started insidiously. She reported difficulty lifting and carrying her arm. She reports she got an injection in her shulder about 1 week ago and states that she is feeling good since then.    Limitations Lifting;House hold activities   Diagnostic tests x-ray 02/28/2015 unremarkable.    Patient Stated Goals to get it back to where I can make my bed and  sweep   Pain Score 1    Pain Location Shoulder   Pain Orientation Right   Pain Descriptors / Indicators Aching   Pain Type --  subacute   Pain Onset More than a month ago   Pain Frequency Constant   Aggravating Factors  lifting and carrying items   Pain Relieving Factors pain medication, rest            [facility] PT Assessment - 03/07/15 1512    Assessment   Medical Diagnosis R shoulder pain and weakness   Onset Date --  3 weeks ago   Next MD Visit after therapy has finished   03/20/2015   Prior Therapy no   Precautions   Precautions None   Restrictions   Weight Bearing Restrictions No   Balance Screen   Has the patient fallen in the past 6 months No   Has the patient had a decrease in activity level because of a fear of falling?  No   Is the patient reluctant to leave their home because of a fear of falling?  No   Home Environment   Living Enviornment Private residence   Living Arrangements Spouse/significant other   Available Help at Discharge Available PRN/intermittently;Available 24 hours/day   Type of Home House   Home Access Stairs to enter   Entrance [place] of  Steps 2   Home Layout One level   [place] - 4 wheels;Cane - quad;Cane - single point;Shower seat   Prior Function   Level of Independence Independent with homemaking with ambulation;Independent with basic ADLs;Independent with gait;Independent with transfers   Vocation Retired   Leisure tough to say due to having difficult year   Cognition   Overall Cognitive Status Within Functional Limits for tasks assessed   Observation/Other Assessments   Focus on Therapeutic Outcomes (FOTO)  26% limited  predicted 25%    ROM / Strength   AROM / PROM / Strength AROM;PROM;Strength   AROM   AROM Assessment Site Shoulder   Right/Left Shoulder Left;Right   Right Shoulder Extension 58 Degrees   Right Shoulder Flexion 172 Degrees   Right Shoulder ABduction 178 Degrees   Right Shoulder Internal  Rotation 74 Degrees   Right Shoulder External Rotation 66 Degrees   Left Shoulder Extension 68 Degrees   Left Shoulder Flexion 168 Degrees   Left Shoulder ABduction 178 Degrees   Left Shoulder Internal Rotation 74 Degrees   Left Shoulder External Rotation 66 Degrees   PROM   Overall PROM  Within functional limits for tasks performed   PROM Assessment Site Shoulder   Right/Left Shoulder Right;Left   Strength   Strength Assessment Site Shoulder   Right/Left Shoulder Left;Right   Right Shoulder Flexion 4-/5  pain during testing   Right Shoulder Extension 5/5   Right Shoulder ABduction 4-/5  pain during testing   Right Shoulder Internal Rotation 3+/5  pain during testing   Right Shoulder External Rotation 3+/5   Left Shoulder Flexion 4/5   Left Shoulder Extension 5/5   Left Shoulder ABduction 4-/5   Left Shoulder Internal Rotation 5/5   Left Shoulder External Rotation 5/5   Special Tests    Special Tests Rotator Cuff Impingement   Rotator Cuff Impingment tests Neer impingement test;Hawkins- Kennedy test;Full Can test;Empty Can test;Painful Arc of Motion   Neer Impingement test    Findings Positive   Side Right   Hawkins-Kennedy test   Findings Positive   Side Right   Empty Can test   Findings Positive   Side Right   Full Can test   Findings Negative   Painful Arc of Motion   Findings Positive   Side Right   other   Comments scapular assist test                   [facility] Adult PT Treatment/Exercise - 03/07/15 0001    Shoulder Exercises: Standing   External Rotation AROM;Strengthening;Right;10 reps   Theraband Level (Shoulder External Rotation) Level 2 (Red)   Internal Rotation AROM;Strengthening;Right;10 reps   Theraband Level (Shoulder Internal Rotation) Level 2 (Red)   Other Standing Exercises wall push up with protraction x 10                PT Education - 03/07/15 1720    Education provided Yes   Education Details evaluation findings, POC,  Goals, HEP, anatomical explanation   Person(s) Educated Patient   Methods Explanation   Comprehension Verbalized understanding;Returned demonstration          PT Short Term Goals - 03/07/15 1726    PT SHORT TERM GOAL #1   Title pt will be I with basic HEP ( 03/28/2015)   Time 3   Period Weeks   Status New   PT SHORT TERM GOAL #2   Title pt will increase FOTO score by >  5 points to assist with functional capacity (03/28/2015)   Time 3   Period Weeks   Status New           PT Long Term Goals - 22-Mar-2015 1727    PT LONG TERM GOAL #1   Title she will be I with advanced HEP (04/18/15)   Time 6   Period Weeks   Status New   PT LONG TERM GOAL #2   Title she will decrease R shoulder pain to < 2/10 during and following up extremity use to assist with ADL's (04/18/15)   Time 6   Period Weeks   Status New   PT LONG TERM GOAL #3   Title She will increase R shoulder strength to > 4+/5 to assist with lifting and carrying activities and ADL (04/18/15)   Time 6   Period Weeks   Status New   PT LONG TERM GOAL #4   Title She will be able to verbalize and demonstrate technqiues to reduce risk or R shoulder reinjury via postural awareness, lifting and carrying mechanics, and HEP (04/18/15)   Time 6   Period Weeks   Status New               Plan - 2015/03/22 1721    Clinical Impression Statement Rosealee presents to OPPT with CC of R shoulder pain that has been going on for a couple weeks.  She reports getting a shot in it about a week ago and was prescribed tramadol which she reports not having much pain in it. She  demonstrates mild limitation with AROM compared bilaterally with Functional PROM. MMT revealed weakness with intermittent pain during testing especially with resisted shoulder IR and abduction. palpaiton revealed tenederness located at the muscle belly of the supraspinatus, and at the long head of the bicpes tendon. 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  She would benefit from skilled physical therapy to maximize her function by addressing the impairments listed.    Pt will benefit from skilled therapeutic intervention in order to improve on the following deficits Impaired UE functional use;Decreased range of motion;Difficulty walking;Impaired flexibility;Improper body mechanics;Postural dysfunction;Decreased endurance;Decreased activity tolerance;Decreased strength;Increased muscle spasms;Increased fascial restricitons   Rehab Potential Good   PT Frequency 2x / week   PT Duration 6 weeks   PT Treatment/Interventions ADLs/Self Care Home Management;Moist Heat;Therapeutic activities;Patient/family education;Therapeutic exercise;Ultrasound;Dry needling;Neuromuscular re-education;Manual techniques;Electrical Stimulation;Cryotherapy;Passive range of motion   PT Next Visit Plan assess response to HEP, scapular stabilization, RC strengthening, modalities PRN   PT Home Exercise Plan SEE HEP handout   Consulted and Agree with Plan of Care Patient          G-Codes - 03/22/15 1731    Functional Assessment Tool Used FOTO 26% limitation   Functional Limitation Carrying, moving and handling objects   Carrying, Moving and Handling Objects Current Status (P1025) At least 20 percent but less than 40 percent impaired, limited or restricted   Carrying, Moving and Handling Objects Goal Status (E5277) At least 1 percent but less than 20 percent impaired, limited or restricted       Problem List Patient Active Problem List   Diagnosis Date Noted  . Nausea with vomiting   . CAP (community acquired pneumonia) 02/08/2015  . Acute ischemic colitis   . Rectal bleeding   . Abdominal pain   . Blood in stool   .  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 Colitis 02/06/2015  . Abdominal mass 02/06/2015  . HTN (hypertension)  02/06/2015  . Peripheral vascular disease 02/24/2014  . LUPUS ERYTHEMATOSUS, DISCOID 11/21/2009  . Essential hypertension 12/08/2008  . COPD, mild-Smoker till 2012 12/08/2008   Starr Lake PT, DPT, LAT, ATC  03/07/2015  5:37 PM   [facility] [address] [place], [place], [address] Phone: [phone]   Fax:  [phone]

## 2015-03-08 ENCOUNTER — Ambulatory Visit: Payer: Medicare Other | Admitting: Physical Therapy

## 2015-03-08 DIAGNOSIS — M25619 Stiffness of unspecified shoulder, not elsewhere classified: Secondary | ICD-10-CM

## 2015-03-08 DIAGNOSIS — R29898 Other symptoms and signs involving the musculoskeletal system: Secondary | ICD-10-CM | POA: Diagnosis not present

## 2015-03-08 DIAGNOSIS — R293 Abnormal posture: Secondary | ICD-10-CM

## 2015-03-08 DIAGNOSIS — M25511 Pain in right shoulder: Secondary | ICD-10-CM | POA: Diagnosis not present

## 2015-03-08 DIAGNOSIS — M25611 Stiffness of right shoulder, not elsewhere classified: Secondary | ICD-10-CM | POA: Diagnosis not present

## 2015-03-08 NOTE — Therapy (Signed)
[name], [place], [address] Phone: [phone]   Fax:  [phone]  Physical Therapy Treatment  Patient Details  Name: Casey Wilkinson MRN: [id number] Date of Birth: Aug 12, 1941 Referring Provider:  Shon Baton, MD  Encounter Date: 03/08/2015      PT End of Session - 03/08/15 1500    Visit Number 2   Number of Visits 12   Date for PT Re-Evaluation 04/18/15   PT Start Time 9371   PT Stop Time 1500   PT Time Calculation (min) 45 min   Activity Tolerance Patient tolerated treatment well   Behavior During Therapy [facility] for tasks assessed/performed      Past Medical History  Diagnosis Date  . Discoid lupus erythematosus   . Hypertension   . COPD (chronic obstructive pulmonary disease)   . Anemia   . Colitis April 2016  . Diverticulitis April 2016    and Colitis    Past Surgical History  Procedure Laterality Date  . Total vaginal hysterectomy  1968  . Hemmroids  2006  . Recocele/entracele  2006  . Abdominal hysterectomy  1968    There were no vitals filed for this visit.  Visit Diagnosis:  Right shoulder pain  Stiffness of right shoulder joint  Decreased range of motion (ROM) of shoulder  Abnormal posture      Subjective Assessment - 03/08/15 1420    Subjective No pain. Has not has a chance to try exercises.  Just called to schedule due to a cancellation.   Currently in Pain? No/denies            [facility] PT Assessment - 03/07/15 1512    Assessment   Medical Diagnosis R shoulder pain and weakness   Onset Date --  3 weeks ago   Next MD Visit after therapy has finished   03/20/2015   Prior Therapy no   Precautions   Precautions None   Restrictions   Weight Bearing Restrictions No   Balance Screen   Has the patient fallen in the past 6 months No   Has the patient had a decrease in activity level because of a fear of falling?  No   Is the patient reluctant to leave their home  because of a fear of falling?  No   Home Environment   Living Enviornment Private residence   Living Arrangements Spouse/significant other   Available Help at Discharge Available PRN/intermittently;Available 24 hours/day   Type of Home House   Home Access Stairs to enter   Entrance Stairs-Number of Steps 2   Home Layout One level   [place] - 4 wheels;Cane - quad;Cane - single point;Shower seat   Prior Function   Level of Independence Independent with homemaking with ambulation;Independent with basic ADLs;Independent with gait;Independent with transfers   Vocation Retired   Leisure tough to say due to having difficult year   Cognition   Overall Cognitive Status Within Functional Limits for tasks assessed   Observation/Other Assessments   Focus on Therapeutic Outcomes (FOTO)  26% limited  predicted 25%    ROM / Strength   AROM / PROM / Strength AROM;PROM;Strength   AROM   AROM Assessment Site Shoulder   Right/Left Shoulder Left;Right   Right Shoulder Extension 58 Degrees   Right Shoulder Flexion 172 Degrees   Right Shoulder ABduction 178 Degrees   Right Shoulder Internal Rotation 74 Degrees   Right Shoulder External Rotation 66 Degrees   Left Shoulder Extension  68 Degrees   Left Shoulder Flexion 168 Degrees   Left Shoulder ABduction 178 Degrees   Left Shoulder Internal Rotation 74 Degrees   Left Shoulder External Rotation 66 Degrees   PROM   Overall PROM  Within functional limits for tasks performed   PROM Assessment Site Shoulder   Right/Left Shoulder Right;Left   Strength   Strength Assessment Site Shoulder   Right/Left Shoulder Left;Right   Right Shoulder Flexion 4-/5  pain during testing   Right Shoulder Extension 5/5   Right Shoulder ABduction 4-/5  pain during testing   Right Shoulder Internal Rotation 3+/5  pain during testing   Right Shoulder External Rotation 3+/5   Left Shoulder Flexion 4/5   Left Shoulder Extension 5/5   Left Shoulder  ABduction 4-/5   Left Shoulder Internal Rotation 5/5   Left Shoulder External Rotation 5/5   Special Tests    Special Tests Rotator Cuff Impingement   Rotator Cuff Impingment tests Neer impingement test;Hawkins- Kennedy test;Full Can test;Empty Can test;Painful Arc of Motion   Neer Impingement test    Findings Positive   Side Right   Hawkins-Kennedy test   Findings Positive   Side Right   Empty Can test   Findings Positive   Side Right   Full Can test   Findings Negative   Painful Arc of Motion   Findings Positive   Side Right   other   Comments scapular assist test                     [facility] Adult PT Treatment/Exercise - 03/08/15 1423    Shoulder Exercises: Supine   External Rotation --  10 reps both, cues, tubes issued to decrease hand cramp    Flexion --  narrow and wide 10  reps,  Lt pain (good side, Brief)   Theraband Level (Shoulder ABduction) Level 2 (Red)   ABduction Limitations 10 reps, painful    Other Supine Exercises AA D1 PTA assisting    Shoulder Exercises: Seated   Theraband Level (Shoulder Row) Level 2 (Red)   Row Weight (lbs) 10 reps   Shoulder Exercises: Standing   Theraband Level (Shoulder Horizontal ABduction) Level 2 (Red)   Horizontal ABduction Limitations 10 reps   Theraband Level (Shoulder External Rotation) Level 2 (Red)   Theraband Level (Shoulder Internal Rotation) Level 2 (Red)   Other Standing Exercises wall push up and stretch 10 reps   Manual Therapy   Manual therapy comments --  Soft tissue work arm triceps, biceps tender multiple, soften                PT Education - 03/07/15 1720    Education provided Yes   Education Details evaluation findings, POC, Goals, HEP, anatomical explanation   Person(s) Educated Patient   Methods Explanation   Comprehension Verbalized understanding;Returned demonstration          PT Short Term Goals - 03/07/15 1726    PT SHORT TERM GOAL #1   Title pt will be I with basic HEP (  03/28/2015)   Time 3   Period Weeks   Status New   PT SHORT TERM GOAL #2   Title pt will increase FOTO score by > 5 points to assist with functional capacity (03/28/2015)   Time 3   Period Weeks   Status New           PT Long Term Goals - 03/07/15 1727    PT LONG TERM GOAL #  1   Title she will be I with advanced HEP (04/18/15)   Time 6   Period Weeks   Status New   PT LONG TERM GOAL #2   Title she will decrease R shoulder pain to < 2/10 during and following up extremity use to assist with ADL's (04/18/15)   Time 6   Period Weeks   Status New   PT LONG TERM GOAL #3   Title She will increase R shoulder strength to > 4+/5 to assist with lifting and carrying activities and ADL (04/18/15)   Time 6   Period Weeks   Status New   PT LONG TERM GOAL #4   Title She will be able to verbalize and demonstrate technqiues to reduce risk or R shoulder reinjury via postural awareness, lifting and carrying mechanics, and HEP (04/18/15)   Time 6   Period Weeks   Status New               Plan - 03/08/15 1721    Clinical Impression Statement Patient is easily overwhelmes with exercises, extra time required.  fewer rather than more exercises for home.  Some intermittant pain during session , none post.  No new goals met.   PT Next Visit Plan review bands continue scapular strength, Shoulder   Consulted and Agree with Plan of Care Patient          G-Codes - Mar 31, 2015 1731    Functional Assessment Tool Used FOTO 26% limitation   Functional Limitation Carrying, moving and handling objects   Carrying, Moving and Handling Objects Current Status (X3818) At least 20 percent but less than 40 percent impaired, limited or restricted   Carrying, Moving and Handling Objects Goal Status (E9937) At least 1 percent but less than 20 percent impaired, limited or restricted      Problem List Patient Active Problem List   Diagnosis Date Noted  . Nausea with vomiting   . CAP (community acquired  pneumonia) 02/08/2015  . Acute ischemic colitis   . Rectal bleeding   . Abdominal pain   . Blood in stool   . 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 Colitis 02/06/2015  . Abdominal mass 02/06/2015  . HTN (hypertension) 02/06/2015  . Peripheral vascular disease 02/24/2014  . LUPUS ERYTHEMATOSUS, DISCOID 11/21/2009  . Essential hypertension 12/08/2008  . COPD, mild-Smoker till 2012 12/08/2008    [facility] 03/08/2015, 5:24 PM  [facility] [address] [place], [place], [address] Phone: [phone]   Fax:  [phone]  Melvenia Needles, PTA 03/08/2015 5:24 PM Phone: [phone] Fax: [phone]

## 2015-03-10 DIAGNOSIS — D485 Neoplasm of uncertain behavior of skin: Secondary | ICD-10-CM | POA: Diagnosis not present

## 2015-03-10 DIAGNOSIS — Z85828 Personal history of other malignant neoplasm of skin: Secondary | ICD-10-CM | POA: Diagnosis not present

## 2015-03-10 DIAGNOSIS — C44722 Squamous cell carcinoma of skin of right lower limb, including hip: Secondary | ICD-10-CM | POA: Diagnosis not present

## 2015-03-14 ENCOUNTER — Ambulatory Visit: Payer: Medicare Other | Admitting: Physical Therapy

## 2015-03-14 DIAGNOSIS — M25611 Stiffness of right shoulder, not elsewhere classified: Secondary | ICD-10-CM

## 2015-03-14 DIAGNOSIS — M25511 Pain in right shoulder: Secondary | ICD-10-CM | POA: Diagnosis not present

## 2015-03-14 DIAGNOSIS — R293 Abnormal posture: Secondary | ICD-10-CM | POA: Diagnosis not present

## 2015-03-14 DIAGNOSIS — M25619 Stiffness of unspecified shoulder, not elsewhere classified: Secondary | ICD-10-CM

## 2015-03-14 DIAGNOSIS — R29898 Other symptoms and signs involving the musculoskeletal system: Secondary | ICD-10-CM

## 2015-03-14 NOTE — Patient Instructions (Signed)
Lateral Raise   Stand or sit with arms at sides. Inhale, then while exhaling, lift hands to shoulder height, arms straight. Hold 1 count. Slowly return to starting position. Repeat _10___ times per set. Do _2__ sets per session. Do __2__ sessions per week. May be done with dumbbells, tubing or resistive band.  Copyright  VHI. All rights reserved.  Raise: Forward   Anchor tubing under feet in narrow stance. Palms down, raise arms in front to parallel. Repeat 10__ times per set. Do _2_ sets per session. Do 3-5__ sessions per week.  [email or web address].exer.us/100   Copyright  VHI. All rights reserved.

## 2015-03-14 NOTE — Therapy (Addendum)
[place], [place], [address] Phone: [phone]   Fax:  [phone]  Physical Therapy Treatment  Patient Details  Name: Casey Wilkinson MRN: [id number] Date of Birth: [birth year] Referring Provider:  Shon Baton, MD  Encounter Date: 03/14/2015      PT End of Session - 03/14/15 1154    Visit Number 3   Number of Visits 12   Date for PT Re-Evaluation 04/18/15   PT Start Time 6222   PT Stop Time 1232   PT Time Calculation (min) 43 min      Past Medical History  Diagnosis Date  . Discoid lupus erythematosus   . Hypertension   . COPD (chronic obstructive pulmonary disease)   . Anemia   . Colitis April 2016  . Diverticulitis April 2016    and Colitis    Past Surgical History  Procedure Laterality Date  . Total vaginal hysterectomy  1968  . Hemmroids  2006  . Recocele/entracele  2006  . Abdominal hysterectomy  1968    There were no vitals filed for this visit.  Visit Diagnosis:  Right shoulder pain  Decreased range of motion (ROM) of shoulder  Abnormal posture  Weakness of shoulder  Stiffness of right shoulder joint      Subjective Assessment - 03/14/15 1339    Subjective No pain at all.    Currently in Pain? No/denies            [facility] PT Assessment - 03/14/15 1156    Strength   Right Shoulder Flexion 4/5   Right Shoulder ABduction 4/5   Right Shoulder Internal Rotation 4+/5   Right Shoulder External Rotation 4/5                     [facility] Adult PT Treatment/Exercise - 03/14/15 0001    Shoulder Exercises: Supine   Horizontal ABduction Strengthening;Both;15 reps;Theraband   Theraband Level (Shoulder Horizontal ABduction) Level 2 (Red)   Horizontal ABduction Limitations also diagonals x 10 each   External Rotation Strengthening;Both;Theraband   Theraband Level (Shoulder External Rotation) Level 3 (Green)   Shoulder Exercises: Standing   External Rotation 15  reps;Theraband   Theraband Level (Shoulder External Rotation) Level 3 (Green)   Internal Rotation 15 reps;Theraband   Theraband Level (Shoulder Internal Rotation) Level 3 (Green)   Flexion Strengthening;Right;10 reps;Theraband   Theraband Level (Shoulder Flexion) Level 2 (Red)   ABduction Strengthening;Right;10 reps   Theraband Level (Shoulder ABduction) Level 2 (Red)                PT Education - 03/14/15 1230    Education provided Yes   Education Details Standing red band strengthening flexion and abduction   Person(s) Educated Patient   Methods Explanation;Handout   Comprehension Verbalized understanding          PT Short Term Goals - 03/14/15 1340    PT SHORT TERM GOAL #1   Title pt will be I with basic HEP ( 03/28/2015)   Time 3   Period Weeks   Status Achieved   PT SHORT TERM GOAL #2   Title pt will increase FOTO score by > 5 points to assist with functional capacity (03/28/2015)   Time 3   Period Weeks   Status Unable to assess           PT Long Term Goals - 03/14/15 1340    PT LONG TERM GOAL #1   Title she will be  I with advanced HEP (04/18/15)   Time 6   Period Weeks   Status On-going   PT LONG TERM GOAL #2   Title she will decrease R shoulder pain to < 2/10 during and following up extremity use to assist with ADL's (04/18/15)   Time 6   Period Weeks   Status Achieved   PT LONG TERM GOAL #3   Title She will increase R shoulder strength to > 4+/5 to assist with lifting and carrying activities and ADL (04/18/15)   Time 6   Period Weeks   Status On-going   PT LONG TERM GOAL #4   Title She will be able to verbalize and demonstrate technqiues to reduce risk or R shoulder reinjury via postural awareness, lifting and carrying mechanics, and HEP (04/18/15)   Time 6   Period Weeks   Status On-going               Plan - 03/14/15 1332    Clinical Impression Statement Pt presents today with no pain. She reports dressing, grooming, and ADLS without  right shoulder pain. See goals met. Right shoulder strength improved since eval without pain upon testing. Bilateral AROM shoulders equal without pain. She has not been using her right arm for lifting heavy objects. She sees the MD before returning for her next visit.    PT Next Visit Plan Recheck strength and goals.         Problem List Patient Active Problem List   Diagnosis Date Noted  . Nausea with vomiting   . CAP (community acquired pneumonia) 02/08/2015  . Acute ischemic colitis   . Rectal bleeding   . Abdominal pain   . Blood in stool   . 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 Colitis 02/06/2015  . Abdominal mass 02/06/2015  . HTN (hypertension) 02/06/2015  . Peripheral vascular disease 02/24/2014  . LUPUS ERYTHEMATOSUS, DISCOID 11/21/2009  . Essential hypertension 12/08/2008  . COPD, mild-Smoker till 2012 12/08/2008    Dorene Ar, PTA 03/14/2015, 1:43 PM  [facility] [address] [place], [place], [address] Phone: [phone]   Fax:  [phone]    PHYSICAL THERAPY DISCHARGE SUMMARY  Visits from Start of Care: 3  Current functional level related to goals / functional outcomes: See goals   Remaining deficits: Deficits are based on last attended visit due to pt not returning. Intermittent soreness in the shoulder with mild weakness.    Education / Equipment: HEP  Plan: Patient agrees to discharge.  Patient goals were not met. Patient is being discharged due to not returning since the last visit.  ?????       Kristoffer Leamon PT, DPT, LAT, ATC  08/31/2015  8:21 AM

## 2015-03-21 DIAGNOSIS — M7551 Bursitis of right shoulder: Secondary | ICD-10-CM | POA: Diagnosis not present

## 2015-03-21 DIAGNOSIS — M7501 Adhesive capsulitis of right shoulder: Secondary | ICD-10-CM | POA: Diagnosis not present

## 2015-03-22 ENCOUNTER — Ambulatory Visit: Payer: Medicare Other | Admitting: Physical Therapy

## 2015-03-22 DIAGNOSIS — L03115 Cellulitis of right lower limb: Secondary | ICD-10-CM | POA: Diagnosis not present

## 2015-03-22 DIAGNOSIS — D485 Neoplasm of uncertain behavior of skin: Secondary | ICD-10-CM | POA: Diagnosis not present

## 2015-03-22 DIAGNOSIS — Z85828 Personal history of other malignant neoplasm of skin: Secondary | ICD-10-CM | POA: Diagnosis not present

## 2015-03-22 DIAGNOSIS — Z79899 Other long term (current) drug therapy: Secondary | ICD-10-CM | POA: Diagnosis not present

## 2015-03-22 DIAGNOSIS — L400 Psoriasis vulgaris: Secondary | ICD-10-CM | POA: Diagnosis not present

## 2015-03-22 DIAGNOSIS — C44722 Squamous cell carcinoma of skin of right lower limb, including hip: Secondary | ICD-10-CM | POA: Diagnosis not present

## 2015-03-27 ENCOUNTER — Encounter: Payer: Medicare Other | Admitting: Physical Therapy

## 2015-03-29 ENCOUNTER — Encounter: Payer: Medicare Other | Admitting: Physical Therapy

## 2015-03-30 ENCOUNTER — Telehealth: Payer: Self-pay | Admitting: Physician Assistant

## 2015-03-31 ENCOUNTER — Other Ambulatory Visit: Payer: Self-pay | Admitting: *Deleted

## 2015-03-31 ENCOUNTER — Telehealth: Payer: Self-pay | Admitting: *Deleted

## 2015-03-31 NOTE — Telephone Encounter (Signed)
Called and rescheduled the colonoscopy with the patient for 04-13-2015 with Dr. Henrene Pastor.  I put a Suprep at the front desk for the patient today with new instrucitons.

## 2015-03-31 NOTE — Telephone Encounter (Signed)
-----   Message from Darden Dates sent at 03/31/2015  1:44 PM EDT ----- Pt checking back about msg from yesterday. Can be reached at [phone] or [phone] Thanks Amy

## 2015-04-03 ENCOUNTER — Encounter: Payer: Medicare Other | Admitting: Physical Therapy

## 2015-04-03 DIAGNOSIS — L0100 Impetigo, unspecified: Secondary | ICD-10-CM | POA: Diagnosis not present

## 2015-04-05 ENCOUNTER — Encounter: Payer: Medicare Other | Admitting: Physical Therapy

## 2015-04-13 ENCOUNTER — Encounter: Payer: Self-pay | Admitting: Internal Medicine

## 2015-04-13 ENCOUNTER — Ambulatory Visit (AMBULATORY_SURGERY_CENTER): Payer: Medicare Other | Admitting: Internal Medicine

## 2015-04-13 VITALS — BP 137/71 | HR 123 | Temp 97.2°F | Resp 26 | Ht 65.5 in | Wt 161.0 lb

## 2015-04-13 DIAGNOSIS — D122 Benign neoplasm of ascending colon: Secondary | ICD-10-CM

## 2015-04-13 DIAGNOSIS — L932 Other local lupus erythematosus: Secondary | ICD-10-CM | POA: Diagnosis not present

## 2015-04-13 DIAGNOSIS — K559 Vascular disorder of intestine, unspecified: Secondary | ICD-10-CM

## 2015-04-13 DIAGNOSIS — D124 Benign neoplasm of descending colon: Secondary | ICD-10-CM

## 2015-04-13 DIAGNOSIS — K635 Polyp of colon: Secondary | ICD-10-CM

## 2015-04-13 DIAGNOSIS — K5289 Other specified noninfective gastroenteritis and colitis: Secondary | ICD-10-CM | POA: Diagnosis not present

## 2015-04-13 DIAGNOSIS — D127 Benign neoplasm of rectosigmoid junction: Secondary | ICD-10-CM | POA: Diagnosis not present

## 2015-04-13 DIAGNOSIS — D649 Anemia, unspecified: Secondary | ICD-10-CM | POA: Diagnosis not present

## 2015-04-13 DIAGNOSIS — I739 Peripheral vascular disease, unspecified: Secondary | ICD-10-CM | POA: Diagnosis not present

## 2015-04-13 DIAGNOSIS — D509 Iron deficiency anemia, unspecified: Secondary | ICD-10-CM | POA: Diagnosis not present

## 2015-04-13 DIAGNOSIS — D120 Benign neoplasm of cecum: Secondary | ICD-10-CM

## 2015-04-13 DIAGNOSIS — J449 Chronic obstructive pulmonary disease, unspecified: Secondary | ICD-10-CM | POA: Diagnosis not present

## 2015-04-13 DIAGNOSIS — C180 Malignant neoplasm of cecum: Secondary | ICD-10-CM | POA: Diagnosis not present

## 2015-04-13 DIAGNOSIS — K921 Melena: Secondary | ICD-10-CM | POA: Diagnosis not present

## 2015-04-13 DIAGNOSIS — I10 Essential (primary) hypertension: Secondary | ICD-10-CM | POA: Diagnosis not present

## 2015-04-13 MED ORDER — SODIUM CHLORIDE 0.9 % IV SOLN: 500.0000 mL | INTRAVENOUS | Status: DC

## 2015-04-13 NOTE — Patient Instructions (Signed)
YOU HAD AN ENDOSCOPIC PROCEDURE TODAY AT [facility]:   Refer to the procedure report that was given to you for any specific questions about what was found during the examination.  If the procedure report does not answer your questions, please call your gastroenterologist to clarify.  If you requested that your care partner not be given the details of your procedure findings, then the procedure report has been included in a sealed envelope for you to review at your convenience later.  YOU SHOULD EXPECT: Some feelings of bloating in the abdomen. Passage of more gas than usual.  Walking can help get rid of the air that was put into your GI tract during the procedure and reduce the bloating. If you had a lower endoscopy (such as a colonoscopy or flexible sigmoidoscopy) you may notice spotting of blood in your stool or on the toilet paper. If you underwent a bowel prep for your procedure, you may not have a normal bowel movement for a few days.  Please Note:  You might notice some irritation and congestion in your nose or some drainage.  This is from the oxygen used during your procedure.  There is no need for concern and it should clear up in a day or so.  SYMPTOMS TO REPORT IMMEDIATELY:   Following lower endoscopy (colonoscopy or flexible sigmoidoscopy):  Excessive amounts of blood in the stool  Significant tenderness or worsening of abdominal pains  Swelling of the abdomen that is new, acute  Fever of 100F or higher    For urgent or emergent issues, a gastroenterologist can be reached at any hour by calling [phone].   DIET: Your first meal following the procedure should be a small meal and then it is ok to progress to your normal diet. Heavy or fried foods are harder to digest and may make you feel nauseous or bloated.  Likewise, meals heavy in dairy and vegetables can increase bloating.  Drink plenty of fluids but you should avoid alcoholic beverages for 24  hours.  ACTIVITY:  You should plan to take it easy for the rest of today and you should NOT DRIVE or use heavy machinery until tomorrow (because of the sedation medicines used during the test).    FOLLOW UP: Our staff will call the number listed on your records the next business day following your procedure to check on you and address any questions or concerns that you may have regarding the information given to you following your procedure. If we do not reach you, we will leave a message.  However, if you are feeling well and you are not experiencing any problems, there is no need to return our call.  We will assume that you have returned to your regular daily activities without incident.  If any biopsies were taken you will be contacted by phone or by letter within the next 1-3 weeks.  Please call us at [phone] if you have not heard about the biopsies in 3 weeks.    SIGNATURES/CONFIDENTIALITY: You and/or your care partner have signed paperwork which will be entered into your electronic medical record.  These signatures attest to the fact that that the information above on your After Visit Summary has been reviewed and is understood.  Full responsibility of the confidentiality of this discharge information lies with you and/or your care-partner.  Hold Aspirin and all other NSAIDS for 2 weeks Await biopsy results Polyps handout given

## 2015-04-13 NOTE — Progress Notes (Signed)
Called to room to assist during endoscopic procedure.  Patient ID and intended procedure confirmed with present staff. Received instructions for my participation in the procedure from the performing physician.  

## 2015-04-13 NOTE — Op Note (Signed)
[place]  [address] [place], [address]   COLONOSCOPY PROCEDURE REPORT  PATIENT: Casey Wilkinson, Casey Wilkinson  MR#: [id number] BIRTHDATE: Mar 12, 1941 , 77  yrs. old GENDER: female ENDOSCOPIST: Eustace Quail, MD REFERRED HY:WVPX Virgina Jock, M.D. PROCEDURE DATE:  04/13/2015 PROCEDURE:   Colonoscopy, diagnostic, Colonoscopy with snare polypectomy, and Submucosal injection, any substance First Screening Colonoscopy - Avg.  risk and is 50 yrs.  old or older - No.  Prior Negative Screening - Now for repeat screening. N/A  History of Adenoma - Now for follow-up colonoscopy & has been > or = to 3 yrs.  N/A  Polyps removed today? Yes ASA CLASS:   Class III INDICATIONS:Unexplained iron deficiency anemia and Evaluation of unexplained GI bleeding. MEDICATIONS: Monitored anesthesia care and Propofol 1050 mg IV  DESCRIPTION OF PROCEDURE:   After the risks benefits and alternatives of the procedure were thoroughly explained, informed consent was obtained.  The digital rectal exam revealed no abnormalities of the rectum.   The [id number]  endoscope was introduced through the anus and advanced to the cecum, which was identified by both the appendix and ileocecal valve. No adverse events experienced.   The quality of the prep was excellent. (Suprep was used)  The instrument was then slowly withdrawn as the colon was fully examined. Estimated blood loss is zero unless otherwise noted in this procedure report.      COLON FINDINGS: The endoscope was passed to the cecal tip.  In the cecum was a giant broad-based polyp measuring approximately 4 cm. This was somewhat difficult to approach as it was behind the ileocecal valve.  However, in time this was removed with piecemeal polypectomy.  Immediately outside the cecum on the contralateral wall was 2.5 cm sessile polyp which was removed piecemeal using snare cautery.  Finally in the proximal ascending colon, a 15 mm sessile polyp  was removed with snare cautery(jar 1).  Several small polyps in the area were removed with cold snare.  All other polypoid tissue was retrieved and submitted in jar 2.  The colon was tattooed in 2 places just distal to the proximal descending sessile polyp.Sigmoid diverticulosis noted.No other abnormalities. Retroflexed views revealed no abnormalities. The time to cecum = 4.3 Withdrawal time = 93.3   The scope was withdrawn and the procedure completed. COMPLICATIONS: There were no immediate complications.  ENDOSCOPIC IMPRESSION: The endoscope was passed to the cecal tip.  In the cecum was a giant broad-based polyp measuring approximately 4 cm.  This was somewhat difficult to approach as it was behind the ileocecal valve. However, in time this was removed with piecemeal polypectomy. Immediately outside the cecum on the contralateral wall was 2.5 cm sessile polyp which was removed piecemeal using snare cautery. Finally in the proximal ascending colon, a 15 mm sessile polyp was removed with snare cautery(jar 1).  Several small polyps in the area were removed with cold snare.  All other polypoid tissue was retrieved and submitted in jar 2.  The colon was tattooed in 2 places just distal to the proximal descending sessile polyp.Sigmoid diverticulosis noted.No other abnormalities  RECOMMENDATIONS: 1.  Hold Aspirin and all other NSAIDS for 2 weeks. 2.  Await biopsy results  eSigned:  Eustace Quail, MD 04/13/2015 3:54 PM   cc: Shon Baton, MD and The Patient   PATIENT NAME:  Casey Wilkinson, Casey Wilkinson MR#: [id number]

## 2015-04-13 NOTE — Progress Notes (Signed)
To recovery, report to [facility], RN, VSS.

## 2015-04-14 ENCOUNTER — Telehealth: Payer: Self-pay | Admitting: *Deleted

## 2015-04-14 NOTE — Telephone Encounter (Signed)
  Follow up Call-  Call back number 04/13/2015  Post procedure Call Back phone  # [phone]  Permission to leave phone message Yes     Patient questions:  Do you have a fever, pain , or abdominal swelling? No. Pain Score  0 *  Have you tolerated food without any problems? Yes.    Have you been able to return to your normal activities? Yes.    Do you have any questions about your discharge instructions: Diet   No. Medications  No. Follow up visit  No.  Do you have questions or concerns about your Care? No.  Actions: * If pain score is 4 or above: No action needed, pain <4.

## 2015-04-18 ENCOUNTER — Encounter: Payer: Self-pay | Admitting: Internal Medicine

## 2015-04-19 ENCOUNTER — Telehealth: Payer: Self-pay

## 2015-04-19 NOTE — Telephone Encounter (Signed)
-----   Message from Irene Shipper, MD sent at 04/18/2015 12:10 PM EDT ----- Regarding: Surgical referral Vaughan Basta, Please arrange surgical consultation with Dr. Excell Seltzer. The patient had a "malignant cecal polyp" as well as a large polyp just outside the cecum removed endoscopically. The distal extent was marked with tattoo ink. She "will need segmental resection". I have contacted the patient regarding these results and my recommendations. Thank you Dr. Henrene Pastor

## 2015-04-19 NOTE — Telephone Encounter (Signed)
Pt scheduled to see Dr. Excell Seltzer with [facility] 05/19/15'@9'$ :15am. Pt to arrive at 8:45am. Pt aware of appt.

## 2015-04-20 ENCOUNTER — Encounter: Payer: Medicare Other | Admitting: Internal Medicine

## 2015-04-25 DIAGNOSIS — L400 Psoriasis vulgaris: Secondary | ICD-10-CM | POA: Diagnosis not present

## 2015-04-25 DIAGNOSIS — Z79899 Other long term (current) drug therapy: Secondary | ICD-10-CM | POA: Diagnosis not present

## 2015-05-19 ENCOUNTER — Other Ambulatory Visit: Payer: Self-pay | Admitting: General Surgery

## 2015-05-19 DIAGNOSIS — D374 Neoplasm of uncertain behavior of colon: Secondary | ICD-10-CM | POA: Diagnosis not present

## 2015-05-30 DIAGNOSIS — L930 Discoid lupus erythematosus: Secondary | ICD-10-CM | POA: Diagnosis not present

## 2015-05-30 DIAGNOSIS — I6529 Occlusion and stenosis of unspecified carotid artery: Secondary | ICD-10-CM | POA: Diagnosis not present

## 2015-05-30 DIAGNOSIS — L409 Psoriasis, unspecified: Secondary | ICD-10-CM | POA: Diagnosis not present

## 2015-05-30 DIAGNOSIS — I739 Peripheral vascular disease, unspecified: Secondary | ICD-10-CM | POA: Diagnosis not present

## 2015-05-30 DIAGNOSIS — Z6827 Body mass index (BMI) 27.0-27.9, adult: Secondary | ICD-10-CM | POA: Diagnosis not present

## 2015-05-30 DIAGNOSIS — I700 Atherosclerosis of aorta: Secondary | ICD-10-CM | POA: Diagnosis not present

## 2015-05-30 DIAGNOSIS — J449 Chronic obstructive pulmonary disease, unspecified: Secondary | ICD-10-CM | POA: Diagnosis not present

## 2015-05-30 DIAGNOSIS — I10 Essential (primary) hypertension: Secondary | ICD-10-CM | POA: Diagnosis not present

## 2015-05-30 DIAGNOSIS — M3500 Sicca syndrome, unspecified: Secondary | ICD-10-CM | POA: Diagnosis not present

## 2015-05-30 DIAGNOSIS — Z1389 Encounter for screening for other disorder: Secondary | ICD-10-CM | POA: Diagnosis not present

## 2015-05-30 DIAGNOSIS — E785 Hyperlipidemia, unspecified: Secondary | ICD-10-CM | POA: Diagnosis not present

## 2015-05-30 DIAGNOSIS — C189 Malignant neoplasm of colon, unspecified: Secondary | ICD-10-CM | POA: Diagnosis not present

## 2015-06-16 NOTE — Patient Instructions (Signed)
Casey Wilkinson  06/16/2015   Your procedure is scheduled on:  Sept. 2, 2016  Report to [facility] Main  Entrance take [facility]  elevators to 3rd floor to  [place] at  10:30 AM.  Call this number if you have problems the morning of surgery [phone]   Remember: ONLY 1 PERSON MAY GO WITH YOU TO SHORT STAY TO GET  READY MORNING OF [place].  Do not eat food after midnight Tuesday night.  Then follow clear liquid diet Wednesday morning until midnight Wednesday night.  Then nothing by mouth.              Bowel prep as instructed.    Take these medicines the morning of surgery with A SIP OF WATER: Albuterol if needed and bring, Advair.              Take Flagyl and Neomycin day before surgery as instructed.                               You may not have any metal on your body including hair pins and              piercings  Do not wear jewelry, make-up, lotions, powders or perfumes, deodorant             Do not wear nail polish.  Do not shave  48 hours prior to surgery.              Do not bring valuables to the hospital. [name]  Contacts, dentures or bridgework may not be worn into surgery.  Leave suitcase in the car. After surgery it may be brought to your room.       Special Instructions: coughing and deep breathing exercises, leg exercises              Please read over the following fact sheets you were given: _____________________________________________________________________             [facility] - Preparing for Surgery Before surgery, you can play an important role.  Because skin is not sterile, your skin needs to be as free of germs as possible.  You can reduce the number of germs on your skin by washing with CHG (chlorahexidine gluconate) soap before surgery.  CHG is an antiseptic cleaner which kills germs and bonds with the skin to continue killing germs even after washing. Please  DO NOT use if you have an allergy to CHG or antibacterial soaps.  If your skin becomes reddened/irritated stop using the CHG and inform your nurse when you arrive at Short Stay. Do not shave (including legs and underarms) for at least 48 hours prior to the first CHG shower.  You may shave your face/neck. Please follow these instructions carefully:  1.  Shower with CHG Soap the night before surgery and the  morning of Surgery.  2.  If you choose to wash your hair, wash your hair first as usual with your  normal  shampoo.  3.  After you shampoo, rinse your hair and body thoroughly to remove the  shampoo.  4.  Use CHG as you would any other liquid soap.  You can apply chg directly  to the skin and wash                       Gently with a scrungie or clean washcloth.  5.  Apply the CHG Soap to your body ONLY FROM THE NECK DOWN.   Do not use on face/ open                           Wound or open sores. Avoid contact with eyes, ears mouth and genitals (private parts).                       Wash face,  Genitals (private parts) with your normal soap.             6.  Wash thoroughly, paying special attention to the area where your surgery  will be performed.  7.  Thoroughly rinse your body with warm water from the neck down.  8.  DO NOT shower/wash with your normal soap after using and rinsing off  the CHG Soap.                9.  Pat yourself dry with a clean towel.            10.  Wear clean pajamas.            11.  Place clean sheets on your bed the night of your first shower and do not  sleep with pets. Day of Surgery : Do not apply any lotions/deodorants the morning of surgery.  Please wear clean clothes to the hospital/surgery center.  FAILURE TO FOLLOW THESE INSTRUCTIONS MAY RESULT IN THE CANCELLATION OF YOUR SURGERY PATIENT SIGNATURE_________________________________  NURSE  SIGNATURE__________________________________  ________________________________________________________________________    CLEAR LIQUID DIET   Foods Allowed                                                                     Foods Excluded  Coffee and tea, regular and decaf                             liquids that you cannot  Plain Jell-O in any flavor                                             see through such as: Fruit ices (not with fruit pulp)                                     milk, soups, orange juice  Iced Popsicles                                    All solid food Carbonated beverages, regular and diet  Cranberry, grape and apple juices Sports drinks like Gatorade Lightly seasoned clear broth or consume(fat free) Sugar, honey syrup  Sample Menu Breakfast                                Lunch                                     Supper Cranberry juice                    Beef broth                            Chicken broth Jell-O                                     Grape juice                           Apple juice Coffee or tea                        Jell-O                                      Popsicle                                                Coffee or tea                        Coffee or tea  _____________________________________________________________________

## 2015-06-16 NOTE — Progress Notes (Addendum)
04-13-15 - Colonoscopy - EPIC 02-27-15 - Vas. U/S - EPIC 02-27-15 - Carotid Duplex study - EPIC 02-27-15 - LOV - S. Nickel, NP (vasc.surg.) - EPIC 02-28-15 - LOV - Amy Esterwood, PA (gastro) - EPIC 02-11-15 - ABD 2V - EPIC 02-06-15 - 2V CXR - EPIC 02-06-15 - CT ABD/Pelvis - EPIC  07-14-14 - LOV - /dr. Byrum (pulmon.) - EPIC

## 2015-06-19 ENCOUNTER — Encounter (HOSPITAL_COMMUNITY)
Admission: RE | Admit: 2015-06-19 | Discharge: 2015-06-19 | Disposition: A | Discharge status: Home / Self Care | Payer: Medicare Other | Source: Ambulatory Visit | Attending: General Surgery | Admitting: General Surgery

## 2015-06-19 ENCOUNTER — Encounter (HOSPITAL_COMMUNITY): Payer: Self-pay

## 2015-06-19 DIAGNOSIS — Z79899 Other long term (current) drug therapy: Secondary | ICD-10-CM | POA: Insufficient documentation

## 2015-06-19 DIAGNOSIS — D374 Neoplasm of uncertain behavior of colon: Secondary | ICD-10-CM | POA: Diagnosis not present

## 2015-06-19 DIAGNOSIS — Z01818 Encounter for other preprocedural examination: Secondary | ICD-10-CM | POA: Diagnosis not present

## 2015-06-19 HISTORY — DX: Unspecified osteoarthritis, unspecified site: M19.90

## 2015-06-19 LAB — CBC
HCT: 33.2 % — ABNORMAL LOW (ref 36.0–46.0)
Hemoglobin: 10.6 g/dL — ABNORMAL LOW (ref 12.0–15.0)
MCH: 30.6 pg (ref 26.0–34.0)
MCHC: 31.9 g/dL (ref 30.0–36.0)
MCV: 96 fL (ref 78.0–100.0)
PLATELETS: 249 10*3/uL (ref 150–400)
RBC: 3.46 MIL/uL — ABNORMAL LOW (ref 3.87–5.11)
RDW: 14.6 % (ref 11.5–15.5)
WBC: 5.3 10*3/uL (ref 4.0–10.5)

## 2015-06-19 LAB — BASIC METABOLIC PANEL
Anion gap: 5 (ref 5–15)
BUN: 15 mg/dL (ref 6–20)
CHLORIDE: 103 mmol/L (ref 101–111)
CO2: 29 mmol/L (ref 22–32)
CREATININE: 0.71 mg/dL (ref 0.44–1.00)
Calcium: 9.3 mg/dL (ref 8.9–10.3)
GFR calc Af Amer: >60 mL/min (ref >60)
GLUCOSE: 83 mg/dL (ref 65–99)
Potassium: 3.8 mmol/L (ref 3.5–5.1)
SODIUM: 137 mmol/L (ref 135–145)

## 2015-06-20 LAB — HEMOGLOBIN A1C
Hgb A1c MFr Bld: 5.6 % (ref 4.8–5.6)
MEAN PLASMA GLUCOSE: 114 mg/dL

## 2015-06-22 HISTORY — PX: APPENDECTOMY: SHX54

## 2015-06-22 NOTE — H&P (Signed)
History of Present Illness Casey Kitchen T. Casey Martorana MD; 05/19/2015 9:38 AM) Patient words: cecal polyp.  The patient is a 74 year old female who presents with a colonic polyp. Patient is a very pleasant 73 year old female referred by Dr. Henrene Pastor for a malignant cecal polyp. 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 She was admitted to the hospital in April of this year with an acute lower GI bleed. Evaluation included a CT scan which showed evidence of colitis, probably ischemic, in the watershed area of the splenic flexure and proximal left colon. She had had a previous colonoscopy about 10 years ago. This episode resolved quickly and follow-up outpatient colonoscopy was recommended and performed by Dr. Henrene Pastor on January 11, 2015. This was significant for a very large broad-based polyp measuring 4 cm in the cecum which was removed with piecemeal polypectomy. More proximally a 2.5 mm sessile polyp just outside the cecum was removed as well and a small 15 mm sessile polyp completely removed in the ascending colon. 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 The smaller cecal polyp showed a serrated adenoma in the ascending colon polyp was normal polypoid colorectal mucosa. The patient has had no further GI issues, specifically bleeding or pain or difficulty with bowel movements.   Other Problems Casey Wilkinson, [place]; 05/19/2015 9:14 AM) Chronic Obstructive Lung Disease Colon Cancer Hemorrhoids High blood pressure  Past Surgical History Casey Wilkinson, [place]; 05/19/2015 9:14 AM) Colon Polyp Removal - Colonoscopy Hysterectomy (not due to cancer) - Partial  Diagnostic Studies History Casey Wilkinson, CMA; 05/19/2015 9:14 AM) Colonoscopy within last year Mammogram within last year Pap Smear >5 years  ago  Allergies Casey Wilkinson, CMA; 05/19/2015 9:19 AM) Sulfur *CHEMICALS* Doxycycline Hyclate *TETRACYCLINES* Prochlorperazine Edisylate *CHEMICALS*  Medication History Casey Wilkinson, CMA; 05/19/2015 9:19 AM) Albuterol Sulfate (108 (90 Base)MCG/ACT Aero Pow Br Act, Inhalation as needed) Active. Aspirin ('81MG'$  Tablet, Oral daily) Active. (Coated) Lipitor ('10MG'$  Tablet, Oral daily) Active. Lecithin ('1200MG'$  Capsule, Oral daily) Active. Calcium Carbonate-Vit D-Min (600-'400MG'$ -UNIT Tablet, Oral daily) Active. Colace ('100MG'$  Capsule, Oral prn) Active. Advair Diskus (250-50MCG/DOSE Aero Pow Br Act, Inhalation as needed) Active. Rheumatrex (2.'5MG'$  Tablet, Oral daily) Active. Potassium Gluconate ('595MG'$  Tablet, Oral daily) Active. Cyanocobalamin (500MCG Tablet, Oral daily) Active. Medications Reconciled  Social History Casey Wilkinson, CMA; 05/19/2015 9:14 AM) No alcohol use No caffeine use No drug use Tobacco use Former smoker.  Family History Casey Wilkinson, [place]; 05/19/2015 9:14 AM) Colon Polyps Brother, Father, Mother. Heart disease in female family member before age 19 Hypertension Brother, Father.  Pregnancy / Birth History Casey Wilkinson, [place]; 05/19/2015 9:14 AM) Age at menarche 7 years. Gravida 5 Maternal age 17-20 Para 2  Review of Systems ([place]; 05/19/2015 9:14 AM) General Not Present- Appetite Loss, Chills, Fatigue, Fever, Night Sweats, Weight Gain and Weight Loss. Skin Present- Rash. Not Present- Change in Wart/Mole, Dryness, Hives, Jaundice, New Lesions, Non-Healing Wounds and Ulcer. HEENT Present- Hearing Loss, Ringing in the Ears and Wears glasses/contact lenses. Not Present- Earache, Hoarseness, Nose Bleed, Oral Ulcers, Seasonal Allergies, Sinus Pain, Sore Throat, Visual Disturbances and Yellow Eyes. Respiratory Present- Difficulty Breathing. Not Present- Bloody sputum, Chronic Cough, Snoring and Wheezing. Breast Not Present- Breast Mass,  Breast Pain, Nipple Discharge and Skin Changes. Cardiovascular Present- Leg Cramps. Not Present- Chest Pain, Difficulty Breathing Lying Down, Palpitations, Rapid Heart Rate, Shortness of Breath and Swelling of Extremities.  Gastrointestinal Not Present- Abdominal Pain, Bloating, Bloody Stool, Change in Bowel Habits, Chronic diarrhea, Constipation, Difficulty Swallowing, Excessive gas, Gets full quickly at meals, Hemorrhoids, Indigestion, Nausea, Rectal Pain and Vomiting. Female Genitourinary Not Present- Frequency, Nocturia, Painful Urination, Pelvic Pain and Urgency. Musculoskeletal Not Present- Back Pain, Joint Pain, Joint Stiffness, Muscle Pain, Muscle Weakness and Swelling of Extremities. Neurological Not Present- Decreased Memory, Fainting, Headaches, Numbness, Seizures, Tingling, Tremor, Trouble walking and Weakness. Psychiatric Not Present- Anxiety, Bipolar, Change in Sleep Pattern, Depression, Fearful and Frequent crying. Endocrine Not Present- Cold Intolerance, Excessive Hunger, Hair Changes, Heat Intolerance, Hot flashes and New Diabetes. Hematology Not Present- Easy Bruising, Excessive bleeding, Gland problems, HIV and Persistent Infections.   Vitals Casey Wilkinson CMA; 05/19/2015 9:19 AM) 05/19/2015 9:19 AM Weight: 160.6 lb Height: 64in Body Surface Area: 1.81 m Body Mass Index: 27.57 kg/m Temp.: 98.69F(Oral)  Pulse: 68 (Regular)  Resp.: 24 (Unlabored)  BP: 120/50 (Sitting, Left Arm, Standard)    Physical Exam Casey Kitchen T. Zackry Deines MD; 05/19/2015 9:39 AM) The physical exam findings are as follows: Note:General: Alert, well-developed elderly Caucasian female, in no distress Skin: Warm and dry. Patchy scaly rash involving mostly the lower extremities HEENT: No palpable masses or thyromegaly. Sclera nonicteric. Pupils equal round and reactive. Oropharynx clear. Lymph nodes: No cervical, supraclavicular, or inguinal nodes palpable. Lungs: Breath sounds clear and equal.  No wheezing or increased work of breathing. Cardiovascular: Regular rate and rhythm without murmer. No JVD or edema. Peripheral pulses intact. No carotid bruits. Abdomen: Nondistended. Soft and nontender. No masses palpable. No organomegaly. No palpable hernias. Extremities: No edema or joint swelling or deformity. No swelling but some chronic discoloration distal lower extremities consistent with venous disease as well as rash as described above with multiple healed lesions. Neurologic: Alert and fully oriented. Gait normal. No focal weakness. Psychiatric: Normal mood and affect. Thought content appropriate with normal judgement and insight    Assessment & Plan Casey Kitchen T. 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 I discussed these findings in detail with the patient and her husband. I recommended proceeding with a laparoscopic partial right colectomy. We discussed the indications for the procedure and its nature and recovery as well as risks of anesthetic complications, bleeding, infection and anastomotic leak. They were given literature regarding the procedure. She is given prescriptions for mechanical and antibiotic bowel prep. All her questions were answered. Current Plans  Pt Education - [facility] Colon Bowel Prep 2015 Miralax/Antibiotics Pt Education - Pamphlet Given - Colorectal Surgery: discussed with patient and provided information. Schedule for Surgery Laparoscopic right colectomy

## 2015-06-23 ENCOUNTER — Encounter (HOSPITAL_COMMUNITY)
Admission: RE | Disposition: A | Discharge status: Home / Self Care | Payer: Self-pay | Source: Ambulatory Visit | Attending: General Surgery

## 2015-06-23 ENCOUNTER — Inpatient Hospital Stay (HOSPITAL_COMMUNITY)
Admission: RE | Admit: 2015-06-23 | Discharge: 2015-06-29 | DRG: 330 | Disposition: A | Discharge status: Home / Self Care | Payer: Medicare Other | Source: Ambulatory Visit | Attending: General Surgery | Admitting: General Surgery

## 2015-06-23 ENCOUNTER — Inpatient Hospital Stay (HOSPITAL_COMMUNITY): Payer: Medicare Other | Admitting: Anesthesiology

## 2015-06-23 ENCOUNTER — Encounter (HOSPITAL_COMMUNITY): Payer: Self-pay | Admitting: *Deleted

## 2015-06-23 DIAGNOSIS — H9190 Unspecified hearing loss, unspecified ear: Secondary | ICD-10-CM | POA: Diagnosis present

## 2015-06-23 DIAGNOSIS — I10 Essential (primary) hypertension: Secondary | ICD-10-CM | POA: Diagnosis present

## 2015-06-23 DIAGNOSIS — Z01812 Encounter for preprocedural laboratory examination: Secondary | ICD-10-CM

## 2015-06-23 DIAGNOSIS — K660 Peritoneal adhesions (postprocedural) (postinfection): Secondary | ICD-10-CM | POA: Diagnosis present

## 2015-06-23 DIAGNOSIS — Z7982 Long term (current) use of aspirin: Secondary | ICD-10-CM

## 2015-06-23 DIAGNOSIS — L409 Psoriasis, unspecified: Secondary | ICD-10-CM | POA: Diagnosis present

## 2015-06-23 DIAGNOSIS — D120 Benign neoplasm of cecum: Secondary | ICD-10-CM | POA: Diagnosis not present

## 2015-06-23 DIAGNOSIS — Z79899 Other long term (current) drug therapy: Secondary | ICD-10-CM

## 2015-06-23 DIAGNOSIS — G8918 Other acute postprocedural pain: Secondary | ICD-10-CM | POA: Diagnosis not present

## 2015-06-23 DIAGNOSIS — D374 Neoplasm of uncertain behavior of colon: Secondary | ICD-10-CM | POA: Diagnosis present

## 2015-06-23 DIAGNOSIS — I6529 Occlusion and stenosis of unspecified carotid artery: Secondary | ICD-10-CM | POA: Diagnosis present

## 2015-06-23 DIAGNOSIS — D649 Anemia, unspecified: Secondary | ICD-10-CM | POA: Diagnosis not present

## 2015-06-23 DIAGNOSIS — L930 Discoid lupus erythematosus: Secondary | ICD-10-CM | POA: Diagnosis present

## 2015-06-23 DIAGNOSIS — K567 Ileus, unspecified: Secondary | ICD-10-CM | POA: Diagnosis not present

## 2015-06-23 DIAGNOSIS — C180 Malignant neoplasm of cecum: Secondary | ICD-10-CM | POA: Diagnosis not present

## 2015-06-23 DIAGNOSIS — J449 Chronic obstructive pulmonary disease, unspecified: Secondary | ICD-10-CM | POA: Diagnosis present

## 2015-06-23 DIAGNOSIS — M1990 Unspecified osteoarthritis, unspecified site: Secondary | ICD-10-CM | POA: Diagnosis not present

## 2015-06-23 DIAGNOSIS — I739 Peripheral vascular disease, unspecified: Secondary | ICD-10-CM | POA: Diagnosis present

## 2015-06-23 DIAGNOSIS — D122 Benign neoplasm of ascending colon: Secondary | ICD-10-CM | POA: Diagnosis not present

## 2015-06-23 DIAGNOSIS — C182 Malignant neoplasm of ascending colon: Secondary | ICD-10-CM | POA: Diagnosis not present

## 2015-06-23 DIAGNOSIS — Z8249 Family history of ischemic heart disease and other diseases of the circulatory system: Secondary | ICD-10-CM | POA: Diagnosis not present

## 2015-06-23 DIAGNOSIS — Z8371 Family history of colonic polyps: Secondary | ICD-10-CM | POA: Diagnosis not present

## 2015-06-23 DIAGNOSIS — Z90710 Acquired absence of both cervix and uterus: Secondary | ICD-10-CM

## 2015-06-23 DIAGNOSIS — Z87891 Personal history of nicotine dependence: Secondary | ICD-10-CM | POA: Diagnosis not present

## 2015-06-23 HISTORY — PX: COLONOSCOPY: SHX174

## 2015-06-23 HISTORY — PX: LAPAROSCOPIC PARTIAL COLECTOMY: SHX5907

## 2015-06-23 LAB — TYPE AND SCREEN
ABO/RH(D): O POS
ANTIBODY SCREEN: NEGATIVE

## 2015-06-23 LAB — ABO/RH: ABO/RH(D): O POS

## 2015-06-23 SURGERY — LAPAROSCOPIC PARTIAL COLECTOMY
Anesthesia: General | Site: Abdomen

## 2015-06-23 MED ORDER — ONDANSETRON HCL 4 MG/2ML IJ SOLN: 4.0000 mg | Freq: Four times a day (QID) | INTRAMUSCULAR | Status: DC | PRN

## 2015-06-23 MED ORDER — DEXAMETHASONE SODIUM PHOSPHATE 10 MG/ML IJ SOLN
INTRAMUSCULAR | Status: DC | PRN
  Administered 2015-06-23: 10 mg via INTRAVENOUS

## 2015-06-23 MED ORDER — HYDROCHLOROTHIAZIDE 12.5 MG PO CAPS
12.5000 mg | ORAL_CAPSULE | Freq: Every day | ORAL | Status: DC
  Administered 2015-06-23 – 2015-06-29 (×7): 12.5 mg via ORAL
  Filled 2015-06-23 (×7): qty 1

## 2015-06-23 MED ORDER — FENTANYL CITRATE (PF) 250 MCG/5ML IJ SOLN
INTRAMUSCULAR | Status: AC
  Filled 2015-06-23: qty 25

## 2015-06-23 MED ORDER — KCL IN DEXTROSE-NACL 20-5-0.9 MEQ/L-%-% IV SOLN
INTRAVENOUS | Status: DC
  Administered 2015-06-23 – 2015-06-27 (×9): via INTRAVENOUS
  Administered 2015-06-27: 1000 mL via INTRAVENOUS
  Administered 2015-06-28: 05:00:00 via INTRAVENOUS
  Filled 2015-06-23 (×14): qty 1000

## 2015-06-23 MED ORDER — VALSARTAN-HYDROCHLOROTHIAZIDE 160-12.5 MG PO TABS: 1.0000 | ORAL_TABLET | Freq: Every morning | ORAL | Status: DC

## 2015-06-23 MED ORDER — ONDANSETRON HCL 4 MG PO TABS
4.0000 mg | ORAL_TABLET | Freq: Four times a day (QID) | ORAL | Status: DC | PRN
  Administered 2015-06-25: 4 mg via ORAL
  Filled 2015-06-23 (×2): qty 1

## 2015-06-23 MED ORDER — BUPIVACAINE-EPINEPHRINE (PF) 0.25% -1:200000 IJ SOLN
INTRAMUSCULAR | Status: AC
  Filled 2015-06-23: qty 30

## 2015-06-23 MED ORDER — 0.9 % SODIUM CHLORIDE (POUR BTL) OPTIME
TOPICAL | Status: DC | PRN
  Administered 2015-06-23: 1000 mL

## 2015-06-23 MED ORDER — HYDROMORPHONE HCL 1 MG/ML IJ SOLN
0.2500 mg | INTRAMUSCULAR | Status: DC | PRN
  Administered 2015-06-23 (×4): 0.5 mg via INTRAVENOUS

## 2015-06-23 MED ORDER — NEOSTIGMINE METHYLSULFATE 10 MG/10ML IV SOLN
INTRAVENOUS | Status: DC | PRN
  Administered 2015-06-23: 4 mg via INTRAVENOUS

## 2015-06-23 MED ORDER — ERYTHROMYCIN BASE 250 MG PO TABS: 1000.0000 mg | ORAL_TABLET | ORAL | Status: DC

## 2015-06-23 MED ORDER — ROCURONIUM BROMIDE 100 MG/10ML IV SOLN
INTRAVENOUS | Status: DC | PRN
  Administered 2015-06-23: 40 mg via INTRAVENOUS
  Administered 2015-06-23: 10 mg via INTRAVENOUS

## 2015-06-23 MED ORDER — BUPIVACAINE LIPOSOME 1.3 % IJ SUSP
20.0000 mL | Freq: Once | INTRAMUSCULAR | Status: AC
  Administered 2015-06-23: 20 mL
  Filled 2015-06-23: qty 20

## 2015-06-23 MED ORDER — GLYCOPYRROLATE 0.2 MG/ML IJ SOLN
INTRAMUSCULAR | Status: AC
  Filled 2015-06-23: qty 3

## 2015-06-23 MED ORDER — ALVIMOPAN 12 MG PO CAPS
12.0000 mg | ORAL_CAPSULE | Freq: Two times a day (BID) | ORAL | Status: DC
  Administered 2015-06-24 – 2015-06-26 (×6): 12 mg via ORAL
  Filled 2015-06-23 (×8): qty 1

## 2015-06-23 MED ORDER — NEOSTIGMINE METHYLSULFATE 10 MG/10ML IV SOLN
INTRAVENOUS | Status: AC
  Filled 2015-06-23: qty 1

## 2015-06-23 MED ORDER — NEOMYCIN SULFATE 500 MG PO TABS: 1000.0000 mg | ORAL_TABLET | ORAL | Status: DC

## 2015-06-23 MED ORDER — MORPHINE SULFATE (PF) 2 MG/ML IV SOLN
2.0000 mg | INTRAVENOUS | Status: DC | PRN
  Administered 2015-06-23 – 2015-06-24 (×2): 4 mg via INTRAVENOUS
  Administered 2015-06-24 (×2): 2 mg via INTRAVENOUS
  Administered 2015-06-24 (×3): 4 mg via INTRAVENOUS
  Administered 2015-06-26: 2 mg via INTRAVENOUS
  Filled 2015-06-23: qty 2
  Filled 2015-06-23: qty 1
  Filled 2015-06-23 (×4): qty 2
  Filled 2015-06-23 (×2): qty 1

## 2015-06-23 MED ORDER — DEXAMETHASONE SODIUM PHOSPHATE 10 MG/ML IJ SOLN
INTRAMUSCULAR | Status: AC
  Filled 2015-06-23: qty 1

## 2015-06-23 MED ORDER — LIDOCAINE HCL (CARDIAC) 20 MG/ML IV SOLN
INTRAVENOUS | Status: DC | PRN
  Administered 2015-06-23: 100 mg via INTRAVENOUS

## 2015-06-23 MED ORDER — LACTATED RINGERS IV SOLN: INTRAVENOUS | Status: DC

## 2015-06-23 MED ORDER — OXYCODONE-ACETAMINOPHEN 5-325 MG PO TABS
1.0000 | ORAL_TABLET | ORAL | Status: DC | PRN
  Administered 2015-06-24: 1 via ORAL
  Administered 2015-06-25 (×2): 2 via ORAL
  Administered 2015-06-25: 1 via ORAL
  Administered 2015-06-25 – 2015-06-29 (×12): 2 via ORAL
  Filled 2015-06-23 (×3): qty 2
  Filled 2015-06-23: qty 1
  Filled 2015-06-23 (×13): qty 2

## 2015-06-23 MED ORDER — ONDANSETRON HCL 4 MG/2ML IJ SOLN
INTRAMUSCULAR | Status: AC
  Filled 2015-06-23: qty 2

## 2015-06-23 MED ORDER — LIDOCAINE HCL (CARDIAC) 20 MG/ML IV SOLN
INTRAVENOUS | Status: AC
  Filled 2015-06-23: qty 5

## 2015-06-23 MED ORDER — BUPIVACAINE-EPINEPHRINE 0.25% -1:200000 IJ SOLN
INTRAMUSCULAR | Status: DC | PRN
  Administered 2015-06-23: 10 mL

## 2015-06-23 MED ORDER — ROCURONIUM BROMIDE 100 MG/10ML IV SOLN
INTRAVENOUS | Status: AC
  Filled 2015-06-23: qty 1

## 2015-06-23 MED ORDER — CHLORHEXIDINE GLUCONATE CLOTH 2 % EX PADS: 6.0000 | MEDICATED_PAD | Freq: Once | CUTANEOUS | Status: DC

## 2015-06-23 MED ORDER — PEG 3350-KCL-NA BICARB-NACL 420 G PO SOLR: 4000.0000 mL | Freq: Once | ORAL | Status: DC

## 2015-06-23 MED ORDER — DEXTROSE 5 % IV SOLN
2.0000 g | INTRAVENOUS | Status: AC
  Administered 2015-06-23: 2 g via INTRAVENOUS
  Filled 2015-06-23: qty 2

## 2015-06-23 MED ORDER — MEPERIDINE HCL 50 MG/ML IJ SOLN: 6.2500 mg | INTRAMUSCULAR | Status: DC | PRN

## 2015-06-23 MED ORDER — ALVIMOPAN 12 MG PO CAPS
12.0000 mg | ORAL_CAPSULE | Freq: Once | ORAL | Status: AC
  Administered 2015-06-23: 12 mg via ORAL
  Filled 2015-06-23: qty 1

## 2015-06-23 MED ORDER — HYDROMORPHONE HCL 1 MG/ML IJ SOLN
INTRAMUSCULAR | Status: AC
  Administered 2015-06-23: 0.5 mg via INTRAVENOUS
  Filled 2015-06-23: qty 1

## 2015-06-23 MED ORDER — IRBESARTAN 150 MG PO TABS
150.0000 mg | ORAL_TABLET | Freq: Every day | ORAL | Status: DC
  Administered 2015-06-23 – 2015-06-29 (×7): 150 mg via ORAL
  Filled 2015-06-23 (×7): qty 1

## 2015-06-23 MED ORDER — ONDANSETRON HCL 4 MG/2ML IJ SOLN
INTRAMUSCULAR | Status: DC | PRN
Start: 2015-06-23 — End: 2015-06-23
  Administered 2015-06-23: 4 mg via INTRAVENOUS

## 2015-06-23 MED ORDER — SUCCINYLCHOLINE CHLORIDE 20 MG/ML IJ SOLN
INTRAMUSCULAR | Status: DC | PRN
  Administered 2015-06-23: 100 mg via INTRAVENOUS

## 2015-06-23 MED ORDER — LACTATED RINGERS IR SOLN
Status: DC | PRN
Start: 2015-06-23 — End: 2015-06-23
  Administered 2015-06-23: 1

## 2015-06-23 MED ORDER — FENTANYL CITRATE (PF) 100 MCG/2ML IJ SOLN
INTRAMUSCULAR | Status: DC | PRN
  Administered 2015-06-23 (×5): 50 ug via INTRAVENOUS

## 2015-06-23 MED ORDER — PROPOFOL 10 MG/ML IV BOLUS
INTRAVENOUS | Status: DC | PRN
  Administered 2015-06-23: 170 mg via INTRAVENOUS

## 2015-06-23 MED ORDER — GLYCOPYRROLATE 0.2 MG/ML IJ SOLN
INTRAMUSCULAR | Status: DC | PRN
  Administered 2015-06-23: 0.6 mg via INTRAVENOUS

## 2015-06-23 MED ORDER — MOMETASONE FURO-FORMOTEROL FUM 100-5 MCG/ACT IN AERO
2.0000 | INHALATION_SPRAY | Freq: Two times a day (BID) | RESPIRATORY_TRACT | Status: DC
  Administered 2015-06-24 – 2015-06-26 (×2): 2 via RESPIRATORY_TRACT
  Filled 2015-06-23: qty 8.8

## 2015-06-23 MED ORDER — ACETAMINOPHEN 10 MG/ML IV SOLN
INTRAVENOUS | Status: AC
  Filled 2015-06-23: qty 100

## 2015-06-23 MED ORDER — PROPOFOL 10 MG/ML IV BOLUS
INTRAVENOUS | Status: AC
Start: 2015-06-23 — End: 2015-06-23
  Filled 2015-06-23: qty 20

## 2015-06-23 MED ORDER — EPHEDRINE SULFATE 50 MG/ML IJ SOLN
INTRAMUSCULAR | Status: DC | PRN
  Administered 2015-06-23: 10 mg via INTRAVENOUS

## 2015-06-23 MED ORDER — CEFOTETAN DISODIUM-DEXTROSE 2-2.08 GM-% IV SOLR
INTRAVENOUS | Status: AC
  Filled 2015-06-23: qty 50

## 2015-06-23 MED ORDER — LACTATED RINGERS IV SOLN
INTRAVENOUS | Status: DC
  Administered 2015-06-23 (×2): 1000 mL via INTRAVENOUS
  Administered 2015-06-23: 14:00:00 via INTRAVENOUS

## 2015-06-23 MED ORDER — ENOXAPARIN SODIUM 30 MG/0.3ML ~~LOC~~ SOLN
30.0000 mg | SUBCUTANEOUS | Status: DC
  Administered 2015-06-24 – 2015-06-29 (×6): 30 mg via SUBCUTANEOUS
  Filled 2015-06-23 (×8): qty 0.3

## 2015-06-23 MED ORDER — ALBUTEROL SULFATE (2.5 MG/3ML) 0.083% IN NEBU: 3.0000 mL | INHALATION_SOLUTION | Freq: Four times a day (QID) | RESPIRATORY_TRACT | Status: DC | PRN

## 2015-06-23 MED ORDER — ACETAMINOPHEN 10 MG/ML IV SOLN: 1000.0000 mg | Freq: Once | INTRAVENOUS | Status: DC

## 2015-06-23 SURGICAL SUPPLY — 70 items
APPLIER CLIP ROT 10 11.4 M/L (STAPLE)
APR CLP MED LRG 11.4X10 (STAPLE)
BLADE EXTENDED COATED 6.5IN (ELECTRODE) IMPLANT
CABLE HIGH FREQUENCY MONO STRZ (ELECTRODE) ×2 IMPLANT
CELLS DAT CNTRL 66122 CELL SVR (MISCELLANEOUS) IMPLANT
CHLORAPREP W/TINT 26ML (MISCELLANEOUS) ×3 IMPLANT
CLIP APPLIE ROT 10 11.4 M/L (STAPLE) IMPLANT
COVER SURGICAL LIGHT HANDLE (MISCELLANEOUS) ×5 IMPLANT
DECANTER SPIKE VIAL GLASS SM (MISCELLANEOUS) ×3 IMPLANT
DRAIN CHANNEL 19F RND (DRAIN) IMPLANT
DRAPE LAPAROSCOPIC ABDOMINAL (DRAPES) ×3 IMPLANT
DRAPE UTILITY XL STRL (DRAPES) ×1 IMPLANT
DRSG OPSITE POSTOP 4X10 (GAUZE/BANDAGES/DRESSINGS) IMPLANT
DRSG OPSITE POSTOP 4X6 (GAUZE/BANDAGES/DRESSINGS) IMPLANT
DRSG OPSITE POSTOP 4X8 (GAUZE/BANDAGES/DRESSINGS) IMPLANT
ELECT PENCIL ROCKER SW 15FT (MISCELLANEOUS) ×6 IMPLANT
ELECT REM PT RETURN 9FT ADLT (ELECTROSURGICAL) ×3
ELECTRODE REM PT RTRN 9FT ADLT (ELECTROSURGICAL) ×1 IMPLANT
ENDOLOOP SUT PDS II  0 18 (SUTURE) ×4
ENDOLOOP SUT PDS II 0 18 (SUTURE) IMPLANT
GAUZE SPONGE 4X4 12PLY STRL (GAUZE/BANDAGES/DRESSINGS) IMPLANT
GLOVE BIOGEL PI IND STRL 7.5 (GLOVE) ×2 IMPLANT
GLOVE BIOGEL PI INDICATOR 7.5 (GLOVE) ×4
GLOVE ECLIPSE 7.5 STRL STRAW (GLOVE) ×6 IMPLANT
GOWN STRL REUS W/TWL XL LVL3 (GOWN DISPOSABLE) ×12 IMPLANT
LABEL STERILE EO BLANK 1X3 WHT (LABEL) ×2 IMPLANT
LEGGING LITHOTOMY PAIR STRL (DRAPES) ×1 IMPLANT
LIGASURE IMPACT 36 18CM CVD LR (INSTRUMENTS) IMPLANT
LIQUID BAND (GAUZE/BANDAGES/DRESSINGS) ×2 IMPLANT
NEEDLE HYPO 22GX1.5 SAFETY (NEEDLE) ×2 IMPLANT
PACK COLON (CUSTOM PROCEDURE TRAY) ×5 IMPLANT
PACK GENERAL/GYN (CUSTOM PROCEDURE TRAY) ×1 IMPLANT
PORT LAP GEL ALEXIS MED 5-9CM (MISCELLANEOUS) ×2 IMPLANT
RELOAD BLUE CHELON 45 (STAPLE) IMPLANT
RELOAD PROXIMATE 75MM BLUE (ENDOMECHANICALS) ×6 IMPLANT
RELOAD STAPLE 75 3.8 BLU REG (ENDOMECHANICALS) IMPLANT
RETRACTOR WND ALEXIS 18 MED (MISCELLANEOUS) IMPLANT
RTRCTR WOUND ALEXIS 18CM MED (MISCELLANEOUS)
SCISSORS LAP 5X35 DISP (ENDOMECHANICALS) ×3 IMPLANT
SET IRRIG TUBING LAPAROSCOPIC (IRRIGATION / IRRIGATOR) ×3 IMPLANT
SHEARS HARMONIC ACE PLUS 36CM (ENDOMECHANICALS) ×2 IMPLANT
SLEEVE ADV FIXATION 5X100MM (TROCAR) ×6 IMPLANT
SOLUTION ANTI FOG 6CC (MISCELLANEOUS) ×3 IMPLANT
STAPLER GUN LINEAR PROX 60 (STAPLE) ×2 IMPLANT
STAPLER PROXIMATE 75MM BLUE (STAPLE) ×2 IMPLANT
STAPLER VISISTAT 35W (STAPLE) ×1 IMPLANT
SUCTION POOLE TIP (SUCTIONS) ×3 IMPLANT
SUT PDS AB 0 CT1 36 (SUTURE) IMPLANT
SUT PDS AB 1 CTX 36 (SUTURE) ×4 IMPLANT
SUT PDS AB 1 TP1 96 (SUTURE) IMPLANT
SUT PROLENE 2 0 KS (SUTURE) IMPLANT
SUT PROLENE 3 0 SH 48 (SUTURE) IMPLANT
SUT SILK 2 0 (SUTURE) ×6
SUT SILK 2 0 SH CR/8 (SUTURE) ×3 IMPLANT
SUT SILK 2-0 18XBRD TIE 12 (SUTURE) ×1 IMPLANT
SUT SILK 3 0 (SUTURE) ×3
SUT SILK 3 0 SH CR/8 (SUTURE) ×3 IMPLANT
SUT SILK 3-0 18XBRD TIE 12 (SUTURE) ×1 IMPLANT
SYR CONTROL 10ML LL (SYRINGE) ×2 IMPLANT
SYS LAPSCP GELPORT 120MM (MISCELLANEOUS)
SYSTEM LAPSCP GELPORT 120MM (MISCELLANEOUS) IMPLANT
TOWEL OR 17X26 10 PK STRL BLUE (TOWEL DISPOSABLE) ×6 IMPLANT
TOWEL OR NON WOVEN STRL DISP B (DISPOSABLE) ×6 IMPLANT
TRAY FOLEY W/METER SILVER 14FR (SET/KITS/TRAYS/PACK) ×3 IMPLANT
TRAY FOLEY W/METER SILVER 16FR (SET/KITS/TRAYS/PACK) ×3 IMPLANT
TROCAR ADV FIXATION 12X100MM (TROCAR) IMPLANT
TROCAR ADV FIXATION 5X100MM (TROCAR) ×3 IMPLANT
TROCAR BLADELESS OPT 5 100 (ENDOMECHANICALS) ×3 IMPLANT
TROCAR XCEL BLUNT TIP 100MML (ENDOMECHANICALS) ×3 IMPLANT
TUBING FILTER THERMOFLATOR (ELECTROSURGICAL) ×3 IMPLANT

## 2015-06-23 NOTE — Anesthesia Postprocedure Evaluation (Signed)
  Anesthesia Post-op Note  Patient: Casey Wilkinson  Procedure(s) Performed: Procedure(s): LAPAROSCOPIC ILEOCECETOMY (N/A)  Patient Location: PACU  Anesthesia Type:General  Level of Consciousness: awake, alert  and oriented  Airway and Oxygen Therapy: Patient Spontanous Breathing and Patient connected to nasal cannula oxygen  Post-op Pain: mild  Post-op Assessment: Post-op Vital signs reviewed and Patient's Cardiovascular Status Stable              Post-op Vital Signs: Reviewed and stable  Last Vitals:  Filed Vitals:   06/23/15 1705  BP: 151/70  Pulse: 86  Temp: 36.7 C  Resp: 16    Complications: No apparent anesthesia complications

## 2015-06-23 NOTE — Interval H&P Note (Signed)
History and Physical Interval Note:  06/23/2015 11:45 AM  Casey Wilkinson  has presented today for surgery, with the diagnosis of malignant polyp of cecum  The various methods of treatment have been discussed with the patient and family. After consideration of risks, benefits and other options for treatment, the patient has consented to  Procedure(s): LAPAROSCOPIC PARTIAL COLECTOMY (N/A) as a surgical intervention .  The patient's history has been reviewed, patient examined, no change in status, stable for surgery.  I have reviewed the patient's chart and labs.  Questions were answered to the patient's satisfaction.     Tamara Monteith T

## 2015-06-23 NOTE — Op Note (Signed)
Preoperative Diagnosis: malignant polyp of cecum  Postoprative Diagnosis: malignant polyp of cecum  Procedure: Procedure(s): LAPAROSCOPIC ILEOCECETOMY   Surgeon: Excell Seltzer T   Assistants: Leighton Ruff  Anesthesia:  General endotracheal anesthesia  Indications: Patient is a 74 year old female who had a recent episode of rectal bleeding felt likely secondary to transient ischemic colitis of the left colon. She has a history of peripheral vascular disease. Follow-up colonoscopy was performed which revealed a 4 cm sessile polyp near the cecum. 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 After preoperative evaluation and discussion detailed elsewhere we have elected to proceed with laparoscopic right colectomy for surgical treatment. The nature of the surgery and indications, recovery and risks of the discussed with the patient extensively and detailed elsewhere. She has undergone a mechanical and antibiotic bowel prep at home.    Procedure Detail:  Patient was brought to the operating room, placed in the supine position on the operating table, and general endotracheal anesthesia induced. She received preoperative IV antibiotics. Foley catheter was placed. PAS were in place. The abdomen was widely sterilely prepped and draped. Patient timeout was performed and correct procedure verified. Laparoscopic access was obtained with an open Hassan technique with a 1 cm incision at the umbilicus and pneumoperitoneum established. Under direct vision 5 mm trochars were placed in the left lower quadrant just above the anterior superior iliac spine and in the left mid abdomen and in the right mid abdomen. There were noted to be extensive adhesions of omentum down to the cecum and terminal ileum as well as adhesions of the cecum and proximal right colon up to the lateral abdominal wall. Using the Harmonic scalpel I took down these adhesions mobilizing the omentum off of the  bowel. We noticed there were titanium staples associated with the adhesions apparently from her previous hysterectomy. The omentum was mobilized up off the bowel. Lateral adhesions were taken down mobilizing the terminal ileum and right colon back to its natural attachments. The base of the mesentery of the cecum and terminal ileum were exposed from a medial approach and the peritoneum incised and careful blunt dissection carried into the retroperitoneum in an avascular plane between the retroperitoneum and mesentery. The dissection continued superiorly and the duodenum was identified and clearly dissected posteriorly. I did not specifically identify the ureter but stayed up out of the retroperitoneal plane. The base of the right colon mesentery was then exposed medially and the ileocolic vessels dissected in the retroperitoneum peritoneal avascular plane again clearly identifying the duodenum and dissecting further above the retroperitoneal plane along the hepatic flexure and proximal right colon. The ileocolic vessels were then dissected and isolated and divided with the Harmonic scalpel and additionally tied with a 0 PDS Endoloop. Following this lateral attachments were taken down completely mobilizing the terminal ileum cecum and right colon back to the previously dissected retroperitoneal plane. The hepatic flexure was mobilized as I felt we needed to go up to the transverse colon for anastomosis due to fairly extensive adhesions along the right colon extending up toward the hepatic flexure. Hepatic flexure was completely mobilized protecting the duodenum. Omental attachments were taken off the proximal transverse colon mobilizing this. Once we had full mobilization the laparoscopic approach was stopped and the umbilical Hassan trocar site enlarged to approximately 5 cm incision and the wound protector placed. The terminal ileum and right colon up to the transverse colon could be easily delivered through the  incision. Further omental adhesions were taken down off  the transverse colon an area of healthy bowel identified for anastomosis. The mesentery of the terminal ileum right colon and proximal transverse colon were then divided either between clamps and tied with 2-0 silk or with the Harmonic scalpel. After complete mesentery division the terminal ileum and proximal transverse colon were divided with the GIA 75 mm stapler and the specimen removed. Following this a functional end-to-end anastomosis was created between the terminal ileum and the proximal transverse colon with a single firing of the GIA 75 mm stapler. The common enterotomy was closed with a single firing of the TA 60 stapler. The anastomosis was was widely patent under no tension with well-perfused bowel and appeared airtight with pressure. A 2-0 silk  stitch was placed at the [facility] of the anastomosis. The bowel was returned to the peritoneal cavity. At this point all drapes and gloves gowns and instruments were changed. The wound was infiltrated with Exparel local anesthetic. The midline fascia was closed with running #1 PDS begun at either end of the incision and tied centrally. Subcutaneous testis was irrigated and the skin closed with subcuticular 4-0 Monocryl. Liquiban was applied. Sponge needle and instrument counts were correct.    Findings: As above  Estimated Blood Loss:  Minimal         Drains: none  Blood Given: none          Specimens: Terminal ileum and right colon        Complications:  * No complications entered in OR log *         Disposition: PACU - hemodynamically stable.         Condition: stable

## 2015-06-23 NOTE — Anesthesia Preprocedure Evaluation (Addendum)
Anesthesia Evaluation  Patient identified by MRN, date of birth, ID band Patient awake    Reviewed: Allergy & Precautions, NPO status , Patient's Chart, lab work & pertinent test results  Airway Mallampati: I  TM Distance: >3 FB Neck ROM: Full    Dental  (+) Teeth Intact, Chipped, Dental Advisory Given,    Pulmonary COPDformer smoker,  breath sounds clear to auscultation        Cardiovascular hypertension, Pt. on medications + Peripheral Vascular Disease Rhythm:Regular Rate:Normal     Neuro/Psych negative neurological ROS  negative psych ROS   GI/Hepatic negative GI ROS, Neg liver ROS,   Endo/Other  negative endocrine ROS  Renal/GU negative Renal ROS  negative genitourinary   Musculoskeletal  (+) Arthritis -, Osteoarthritis,    Abdominal   Peds negative pediatric ROS (+)  Hematology negative hematology ROS (+)   Anesthesia Other Findings   Reproductive/Obstetrics negative OB ROS                            Lab Results  Component Value Date   WBC 5.3 06/19/2015   HGB 10.6* 06/19/2015   HCT 33.2* 06/19/2015   MCV 96.0 06/19/2015   PLT 249 06/19/2015   Lab Results  Component Value Date   CREATININE 0.71 06/19/2015   BUN 15 06/19/2015   NA 137 06/19/2015   K 3.8 06/19/2015   CL 103 06/19/2015   CO2 29 06/19/2015   Lab Results  Component Value Date   INR 1.14 02/08/2015   INR 1.15 02/07/2015   INR 1.06 02/06/2015   05/2015: EKG: normal sinus rhythm.   Anesthesia Physical Anesthesia Plan  ASA: II  Anesthesia Plan: General   Post-op Pain Management:    Induction: Intravenous  Airway Management Planned: Oral ETT  Additional Equipment:   Intra-op Plan:   Post-operative Plan:   Informed Consent: I have reviewed the patients History and Physical, chart, labs and discussed the procedure including the risks, benefits and alternatives for the proposed anesthesia with the  patient or authorized representative who has indicated his/her understanding and acceptance.   Dental advisory given  Plan Discussed with: CRNA  Anesthesia Plan Comments:         Anesthesia Quick Evaluation

## 2015-06-23 NOTE — Transfer of Care (Signed)
Immediate Anesthesia Transfer of Care Note  Patient: Casey Wilkinson  Procedure(s) Performed: Procedure(s): LAPAROSCOPIC ILEOCECETOMY (N/A)  Patient Location: PACU  Anesthesia Type:General  Level of Consciousness: sedated  Airway & Oxygen Therapy: Patient Spontanous Breathing and Patient connected to face mask oxygen  Post-op Assessment: Report given to RN and Post -op Vital signs reviewed and stable  Post vital signs: Reviewed and stable  Last Vitals:  Filed Vitals:   06/23/15 1012  BP: 124/61  Pulse: 83  Temp: 36.4 C  Resp: 18    Complications: No apparent anesthesia complications

## 2015-06-23 NOTE — Anesthesia Procedure Notes (Signed)
Procedure Name: Intubation Date/Time: 06/23/2015 12:56 PM Performed by: Lind Covert Pre-anesthesia Checklist: Patient identified, Emergency Drugs available, Suction available, Patient being monitored and Timeout performed Patient Re-evaluated:Patient Re-evaluated prior to inductionOxygen Delivery Method: Circle system utilized Preoxygenation: Pre-oxygenation with 100% oxygen Intubation Type: IV induction Laryngoscope Size: Mac and 3 Grade View: Grade II Tube type: Oral Tube size: 7.0 mm Number of attempts: 1 Airway Equipment and Method: Stylet Placement Confirmation: ETT inserted through vocal cords under direct vision,  positive ETCO2 and breath sounds checked- equal and bilateral Secured at: 21 cm Tube secured with: Tape Dental Injury: Teeth and Oropharynx as per pre-operative assessment

## 2015-06-24 LAB — BASIC METABOLIC PANEL
Anion gap: 3 — ABNORMAL LOW (ref 5–15)
BUN: 11 mg/dL (ref 6–20)
CHLORIDE: 101 mmol/L (ref 101–111)
CO2: 28 mmol/L (ref 22–32)
Calcium: 8.3 mg/dL — ABNORMAL LOW (ref 8.9–10.3)
Creatinine, Ser: 0.72 mg/dL (ref 0.44–1.00)
GFR calc Af Amer: >60 mL/min (ref >60)
GFR calc non Af Amer: >60 mL/min (ref >60)
Glucose, Bld: 155 mg/dL — ABNORMAL HIGH (ref 65–99)
Potassium: 4.2 mmol/L (ref 3.5–5.1)
SODIUM: 132 mmol/L — AB (ref 135–145)

## 2015-06-24 LAB — CBC
HEMATOCRIT: 31.4 % — AB (ref 36.0–46.0)
Hemoglobin: 10.4 g/dL — ABNORMAL LOW (ref 12.0–15.0)
MCH: 31.1 pg (ref 26.0–34.0)
MCHC: 33.1 g/dL (ref 30.0–36.0)
MCV: 94 fL (ref 78.0–100.0)
Platelets: 233 10*3/uL (ref 150–400)
RBC: 3.34 MIL/uL — ABNORMAL LOW (ref 3.87–5.11)
RDW: 14.1 % (ref 11.5–15.5)
WBC: 9.3 10*3/uL (ref 4.0–10.5)

## 2015-06-24 NOTE — Plan of Care (Signed)
Problem: Phase I Progression Outcomes Goal: OOB as tolerated unless otherwise ordered Outcome: Completed/Met Date Met:  06/24/15 Patient ambulated half the hallway.

## 2015-06-24 NOTE — Progress Notes (Signed)
Patient ID: Casey Wilkinson, female   DOB: 12-31-1940, 74 y.o.   MRN: [id number] 1 Day Post-Op  Subjective: Moderate right-sided abdominal pain, was pretty severe last night but a little better this morning and relieved with medications. No nausea. Asking for liquids. In good spirits.  Objective: Vital signs in last 24 hours: Temp:  [97.5 F (36.4 C)-98.2 F (36.8 C)] 97.7 F (36.5 C) (09/03 1015) Pulse Rate:  [64-86] 64 (09/03 1015) Resp:  [14-24] 18 (09/03 1015) BP: (131-157)/(50-74) 131/51 mmHg (09/03 1015) SpO2:  [95 %-100 %] 98 % (09/03 1015) Last BM Date: 06/23/15  Intake/Output from previous day: 09/02 0701 - 09/03 0700 In: 3293.3 [I.V.:3293.3] Out: 2320 [Urine:2300; Blood:20] Intake/Output this shift:    General appearance: alert, cooperative and no distress GI: moderate right-sided tenderness. No bowel sounds. Nondistended. Incision/Wound: clean and dry  Lab Results:   Recent Labs  06/24/15 0530  WBC 9.3  HGB 10.4*  HCT 31.4*  PLT 233   BMET  Recent Labs  06/24/15 0530  NA 132*  K 4.2  CL 101  CO2 28  GLUCOSE 155*  BUN 11  CREATININE 0.72  CALCIUM 8.3*     Studies/Results: No results found.  Anti-infectives: Anti-infectives    Start     Dose/Rate Route Frequency Ordered Stop   06/23/15 1027  cefoTEtan (CEFOTAN) 2 g in dextrose 5 % 50 mL IVPB     2 g 100 mL/hr over 30 Minutes Intravenous On call to O.R. 06/23/15 1027 06/23/15 1310   06/23/15 1027  neomycin (MYCIFRADIN) tablet 1,000 mg  Status:  Discontinued     1,000 mg Oral 3 times per day 06/23/15 1027 06/23/15 1029   06/23/15 1027  erythromycin (E-MYCIN) tablet 1,000 mg  Status:  Discontinued     1,000 mg Oral 3 times per day 06/23/15 1027 06/23/15 1029      Assessment/Plan: s/p Procedure(s): LAPAROSCOPIC ILEOCECETOMY Appears stable postoperatively. Start clear liquid diet.   LOS: 1 day    Marv Alfrey T 06/24/2015

## 2015-06-25 LAB — CBC
HCT: 28.3 % — ABNORMAL LOW (ref 36.0–46.0)
Hemoglobin: 9 g/dL — ABNORMAL LOW (ref 12.0–15.0)
MCH: 30.4 pg (ref 26.0–34.0)
MCHC: 31.8 g/dL (ref 30.0–36.0)
MCV: 95.6 fL (ref 78.0–100.0)
PLATELETS: 194 10*3/uL (ref 150–400)
RBC: 2.96 MIL/uL — AB (ref 3.87–5.11)
RDW: 14.6 % (ref 11.5–15.5)
WBC: 5.9 10*3/uL (ref 4.0–10.5)

## 2015-06-25 LAB — BASIC METABOLIC PANEL
ANION GAP: 3 — AB (ref 5–15)
BUN: 9 mg/dL (ref 6–20)
CO2: 26 mmol/L (ref 22–32)
Calcium: 8.2 mg/dL — ABNORMAL LOW (ref 8.9–10.3)
Chloride: 107 mmol/L (ref 101–111)
Creatinine, Ser: 0.73 mg/dL (ref 0.44–1.00)
GLUCOSE: 96 mg/dL (ref 65–99)
POTASSIUM: 4 mmol/L (ref 3.5–5.1)
Sodium: 136 mmol/L (ref 135–145)

## 2015-06-25 MED ORDER — INFLUENZA VAC SPLIT QUAD 0.5 ML IM SUSY
0.5000 mL | PREFILLED_SYRINGE | INTRAMUSCULAR | Status: AC
  Administered 2015-06-26: 0.5 mL via INTRAMUSCULAR
  Filled 2015-06-25 (×2): qty 0.5

## 2015-06-25 MED ORDER — PANTOPRAZOLE SODIUM 40 MG IV SOLR
40.0000 mg | INTRAVENOUS | Status: DC
  Administered 2015-06-25 – 2015-06-26 (×2): 40 mg via INTRAVENOUS
  Filled 2015-06-25 (×2): qty 40

## 2015-06-25 NOTE — Progress Notes (Signed)
Patient ID: Casey Wilkinson, female   DOB: 01/16/1941, 74 y.o.   MRN: [id number] 2 Days Post-Op  Subjective: Still with right lower quadrant abdominal pain but less than yesterday. Tolerating some clear liquids without nausea but having some belching and heartburn. No flatus or bowel movements yet.  Objective: Vital signs in last 24 hours: Temp:  [97.7 F (36.5 C)-98 F (36.7 C)] 98 F (36.7 C) (09/04 0552) Pulse Rate:  [64-80] 80 (09/04 0552) Resp:  [18-22] 20 (09/04 0552) BP: (120-131)/(42-60) 129/60 mmHg (09/04 0552) SpO2:  [97 %-99 %] 98 % (09/04 0552) Last BM Date: 06/23/15  Intake/Output from previous day: 09/03 0701 - 09/04 0700 In: 3440 [P.O.:1040; I.V.:2400] Out: 200 [Urine:200] Intake/Output this shift: Total I/O In: 600 [P.O.:600] Out: -   General appearance: alert, cooperative, no distress and up out of bed and chair Resp: No wheezing or increased work of breathing GI: Moderate right lower quadrant tenderness. Mild distention. Incision/Wound: Clean and dry without evidence of infection  Lab Results:   Recent Labs  06/24/15 0530 06/25/15 0514  WBC 9.3 5.9  HGB 10.4* 9.0*  HCT 31.4* 28.3*  PLT 233 194   BMET  Recent Labs  06/24/15 0530 06/25/15 0514  NA 132* 136  K 4.2 4.0  CL 101 107  CO2 28 26  GLUCOSE 155* 96  BUN 11 9  CREATININE 0.72 0.73  CALCIUM 8.3* 8.2*     Studies/Results: No results found.  Anti-infectives: Anti-infectives    Start     Dose/Rate Route Frequency Ordered Stop   06/23/15 1027  cefoTEtan (CEFOTAN) 2 g in dextrose 5 % 50 mL IVPB     2 g 100 mL/hr over 30 Minutes Intravenous On call to O.R. 06/23/15 1027 06/23/15 1310   06/23/15 1027  neomycin (MYCIFRADIN) tablet 1,000 mg  Status:  Discontinued     1,000 mg Oral 3 times per day 06/23/15 1027 06/23/15 1029   06/23/15 1027  erythromycin (E-MYCIN) tablet 1,000 mg  Status:  Discontinued     1,000 mg Oral 3 times per day 06/23/15 1027 06/23/15 1029       Assessment/Plan: s/p Procedure(s): LAPAROSCOPIC ILEOCECETOMY Stable postoperatively. Still some significant pain but normal white count, normal vital signs and she is improving.  Expected ileus. Continue just clear liquids as tolerated. Ambulation encouraged.   LOS: 2 days    Bertis Hustead T 06/25/2015

## 2015-06-26 DIAGNOSIS — C180 Malignant neoplasm of cecum: Secondary | ICD-10-CM | POA: Diagnosis not present

## 2015-06-26 LAB — CBC
HEMATOCRIT: 30.7 % — AB (ref 36.0–46.0)
HEMOGLOBIN: 9.7 g/dL — AB (ref 12.0–15.0)
MCH: 30.1 pg (ref 26.0–34.0)
MCHC: 31.6 g/dL (ref 30.0–36.0)
MCV: 95.3 fL (ref 78.0–100.0)
Platelets: 208 10*3/uL (ref 150–400)
RBC: 3.22 MIL/uL — AB (ref 3.87–5.11)
RDW: 14.2 % (ref 11.5–15.5)
WBC: 5 10*3/uL (ref 4.0–10.5)

## 2015-06-26 LAB — BASIC METABOLIC PANEL
ANION GAP: 2 — AB (ref 5–15)
CHLORIDE: 108 mmol/L (ref 101–111)
CO2: 26 mmol/L (ref 22–32)
Calcium: 8.3 mg/dL — ABNORMAL LOW (ref 8.9–10.3)
Creatinine, Ser: 0.66 mg/dL (ref 0.44–1.00)
GFR calc Af Amer: >60 mL/min (ref >60)
GFR calc non Af Amer: >60 mL/min (ref >60)
GLUCOSE: 93 mg/dL (ref 65–99)
POTASSIUM: 3.8 mmol/L (ref 3.5–5.1)
Sodium: 136 mmol/L (ref 135–145)

## 2015-06-26 MED ORDER — PANTOPRAZOLE SODIUM 40 MG PO TBEC
40.0000 mg | DELAYED_RELEASE_TABLET | Freq: Every day | ORAL | Status: DC
  Administered 2015-06-27 – 2015-06-29 (×3): 40 mg via ORAL
  Filled 2015-06-26 (×3): qty 1

## 2015-06-26 NOTE — Progress Notes (Signed)
3 Days Post-Op  Subjective: Gradually feeling better. A little less pain. Denies nausea or heartburn. Tolerating clear liquids. Has not yet had any flatus or bowel movements.  Objective: Vital signs in last 24 hours: Temp:  [97.7 F (36.5 C)-97.9 F (36.6 C)] 97.7 F (36.5 C) (09/05 0500) Pulse Rate:  [92-99] 99 (09/05 0500) Resp:  [18-22] 20 (09/05 0500) BP: (145-155)/(62-71) 152/71 mmHg (09/05 0500) SpO2:  [92 %-96 %] 92 % (09/05 0500) Last BM Date: 06/23/15  Intake/Output from previous day: 09/04 0701 - 09/05 0700 In: 3700 [P.O.:1300; I.V.:2400] Out: 3250 [Urine:3250] Intake/Output this shift: Total I/O In: 240 [P.O.:240] Out: 650 [Urine:650]  General appearance: alert, cooperative, no distress and up out of bed GI: still fairly tender in right lower quadrant but somewhat less than yesterday. Positive bowel sounds. Mild distention. Incision/Wound: no erythema or drainage  Lab Results:   Recent Labs  06/25/15 0514 06/26/15 0510  WBC 5.9 5.0  HGB 9.0* 9.7*  HCT 28.3* 30.7*  PLT 194 208   BMET  Recent Labs  06/25/15 0514 06/26/15 0510  NA 136 136  K 4.0 3.8  CL 107 108  CO2 26 26  GLUCOSE 96 93  BUN 9 <5*  CREATININE 0.73 0.66  CALCIUM 8.2* 8.3*     Studies/Results: No results found.  Anti-infectives: Anti-infectives    Start     Dose/Rate Route Frequency Ordered Stop   06/23/15 1027  cefoTEtan (CEFOTAN) 2 g in dextrose 5 % 50 mL IVPB     2 g 100 mL/hr over 30 Minutes Intravenous On call to O.R. 06/23/15 1027 06/23/15 1310   06/23/15 1027  neomycin (MYCIFRADIN) tablet 1,000 mg  Status:  Discontinued     1,000 mg Oral 3 times per day 06/23/15 1027 06/23/15 1029   06/23/15 1027  erythromycin (E-MYCIN) tablet 1,000 mg  Status:  Discontinued     1,000 mg Oral 3 times per day 06/23/15 1027 06/23/15 1029      Assessment/Plan: s/p Procedure(s): LAPAROSCOPIC ILEOCECETOMY Still with some ileus. No apparent complication. Continue liquid diet.  Ambulation encouraged.   LOS: 3 days    Eulice Rutledge T 06/26/2015

## 2015-06-27 ENCOUNTER — Encounter (HOSPITAL_COMMUNITY): Payer: Self-pay | Admitting: General Surgery

## 2015-06-27 NOTE — Progress Notes (Signed)
Patient ID: JOYCELYNN FRITSCHE, female   DOB: 10-May-1941, 74 y.o.   MRN: [id number] 4 Days Post-Op  Subjective: Large loose BM last night. Tol FL without nausea.  Denies pain at rest but very sore RLQ when moving  Objective: Vital signs in last 24 hours: Temp:  [97.7 F (36.5 C)-98.5 F (36.9 C)] 98.4 F (36.9 C) (09/06 0953) Pulse Rate:  [86-99] 92 (09/06 0953) Resp:  [20] 20 (09/06 0953) BP: (142-146)/(63-77) 144/63 mmHg (09/06 0953) SpO2:  [92 %-99 %] 92 % (09/06 0953) Last BM Date: 06/26/15  Intake/Output from previous day: 09/05 0701 - 09/06 0700 In: 2775.8 [P.O.:840; I.V.:1935.8] Out: 2300 [Urine:2300] Intake/Output this shift: Total I/O In: 120 [P.O.:120] Out: -   General appearance: alert, cooperative and no distress GI: Active BS, minimal distention, remains very tender in RLQ Incision/Wound: No erythema or drainage  Lab Results:   Recent Labs  06/25/15 0514 06/26/15 0510  WBC 5.9 5.0  HGB 9.0* 9.7*  HCT 28.3* 30.7*  PLT 194 208   BMET  Recent Labs  06/25/15 0514 06/26/15 0510  NA 136 136  K 4.0 3.8  CL 107 108  CO2 26 26  GLUCOSE 96 93  BUN 9 <5*  CREATININE 0.73 0.66  CALCIUM 8.2* 8.3*     Studies/Results: No results found.  Anti-infectives: Anti-infectives    Start     Dose/Rate Route Frequency Ordered Stop   06/23/15 1027  cefoTEtan (CEFOTAN) 2 g in dextrose 5 % 50 mL IVPB     2 g 100 mL/hr over 30 Minutes Intravenous On call to O.R. 06/23/15 1027 06/23/15 1310   06/23/15 1027  neomycin (MYCIFRADIN) tablet 1,000 mg  Status:  Discontinued     1,000 mg Oral 3 times per day 06/23/15 1027 06/23/15 1029   06/23/15 1027  erythromycin (E-MYCIN) tablet 1,000 mg  Status:  Discontinued     1,000 mg Oral 3 times per day 06/23/15 1027 06/23/15 1029      Assessment/Plan: s/p Procedure(s): LAPAROSCOPIC ILEOCECETOMY Persistent RLQ tenderness but otherwise seems to be progressing OK.  No pain at rest Continue FL for now. Ambulate.  Observe and  check lab in AM   LOS: 4 days    Michaelia Beilfuss T 06/27/2015

## 2015-06-27 NOTE — Progress Notes (Signed)
Pharmacy Brief Note - Alvimopan (Entereg)  The standing order set for alvimopan (Entereg) now includes an automatic order to discontinue the drug after the patient has had a bowel movement.  The change was approved by the Export and the Medical Executive Committee.    This patient has had a bowel movement documented by nursing on 06/26/15.  Therefore, alvimopan has been discontinued.  If there are questions, please contact the pharmacy at [phone].  Thank you- Biagio Borg, [facility] 06/27/2015 8:13 AM

## 2015-06-28 LAB — BASIC METABOLIC PANEL
Anion gap: 5 (ref 5–15)
BUN: <5 mg/dL — ABNORMAL LOW (ref 6–20)
CHLORIDE: 107 mmol/L (ref 101–111)
CO2: 25 mmol/L (ref 22–32)
Calcium: 8.3 mg/dL — ABNORMAL LOW (ref 8.9–10.3)
Creatinine, Ser: 0.65 mg/dL (ref 0.44–1.00)
Glucose, Bld: 108 mg/dL — ABNORMAL HIGH (ref 65–99)
POTASSIUM: 3.6 mmol/L (ref 3.5–5.1)
SODIUM: 137 mmol/L (ref 135–145)

## 2015-06-28 LAB — CBC
HCT: 27.4 % — ABNORMAL LOW (ref 36.0–46.0)
HEMOGLOBIN: 9 g/dL — AB (ref 12.0–15.0)
MCH: 30.6 pg (ref 26.0–34.0)
MCHC: 32.8 g/dL (ref 30.0–36.0)
MCV: 93.2 fL (ref 78.0–100.0)
PLATELETS: 194 10*3/uL (ref 150–400)
RBC: 2.94 MIL/uL — AB (ref 3.87–5.11)
RDW: 14.3 % (ref 11.5–15.5)
WBC: 4.2 10*3/uL (ref 4.0–10.5)

## 2015-06-28 MED ORDER — KCL IN DEXTROSE-NACL 20-5-0.9 MEQ/L-%-% IV SOLN
INTRAVENOUS | Status: DC
  Filled 2015-06-28 (×2): qty 1000

## 2015-06-28 NOTE — Progress Notes (Signed)
Patient ID: Casey Wilkinson, female   DOB: [birth year], 74 y.o.   MRN: [id number] 5 Days Post-Op  Subjective: Feels better. Still very "sore" in the right lower quadrant but gradually improving. Had a more normal bowel movement. Tolerating full liquids.  Objective: Vital signs in last 24 hours: Temp:  [97.9 F (36.6 C)-98.4 F (36.9 C)] 97.9 F (36.6 C) (09/07 0542) Pulse Rate:  [81-94] 81 (09/07 0542) Resp:  [20] 20 (09/07 0542) BP: (130-161)/(61-73) 147/73 mmHg (09/07 0542) SpO2:  [92 %-95 %] 95 % (09/07 0542) Last BM Date: 06/27/15  Intake/Output from previous day: 09/06 0701 - 09/07 0700 In: 2820 [P.O.:1320; I.V.:1500] Out: 850 [Urine:850] Intake/Output this shift:    General appearance: alert, cooperative and no distress GI: remains moderately tender in the right lower quadrant  but somewhat improved. Incision/Wound: no erythema or drainage or other evidence of infection  Lab Results:   Recent Labs  06/26/15 0510 06/28/15 0420  WBC 5.0 4.2  HGB 9.7* 9.0*  HCT 30.7* 27.4*  PLT 208 194   BMET  Recent Labs  06/26/15 0510 06/28/15 0420  NA 136 137  K 3.8 3.6  CL 108 107  CO2 26 25  GLUCOSE 93 108*  BUN <5* <5*  CREATININE 0.66 0.65  CALCIUM 8.3* 8.3*     Studies/Results: Colon, segmental resection for tumor, terminal ileum and right - NO RESIDUAL DYSPLASIA OR ADENOCARCINOMA. - RESIDUAL SESSILE SERRATED POLYP WITHOUT DYSPLASIA (X1). - FOCAL HYPERPLASTIC CHANGES, SEE COMMENT. - BENIGN APPENDIX. - THIRTY OF THIRTY LYMPH NODES NEGATIVE FOR CARCINOMA (0/30).  Anti-infectives: Anti-infectives    Start     Dose/Rate Route Frequency Ordered Stop   06/23/15 1027  cefoTEtan (CEFOTAN) 2 g in dextrose 5 % 50 mL IVPB     2 g 100 mL/hr over 30 Minutes Intravenous On call to O.R. 06/23/15 1027 06/23/15 1310   06/23/15 1027  neomycin (MYCIFRADIN) tablet 1,000 mg  Status:  Discontinued     1,000 mg Oral 3 times per day 06/23/15 1027 06/23/15 1029   06/23/15 1027   erythromycin (E-MYCIN) tablet 1,000 mg  Status:  Discontinued     1,000 mg Oral 3 times per day 06/23/15 1027 06/23/15 1029      Assessment/Plan: s/p Procedure(s): LAPAROSCOPIC ILEOCECETOMY Doing well without apparent complications although still has more tenderness than I would expect. However white count is normal, eating, bowels moving and no pain at rest. Advanced to regular diet. Increase ambulation today. Anticipate discharge tomorrow.   LOS: 5 days    Domanique Huesman T 06/28/2015

## 2015-06-28 NOTE — Care Management Important Message (Signed)
Important Message  Patient Details  Name: Casey Wilkinson MRN: [id number] Date of Birth: 02/21/1941   Medicare Important Message Given:  Yes-second notification given    Camillo Flaming 06/28/2015, 2:20 [place] Message  Patient Details  Name: Casey Wilkinson MRN: [id number] Date of Birth: 1941-06-29   Medicare Important Message Given:  Yes-second notification given    Camillo Flaming 06/28/2015, 2:20 PM

## 2015-06-29 MED ORDER — OXYCODONE-ACETAMINOPHEN 5-325 MG PO TABS: 1.0000 | ORAL_TABLET | ORAL | Status: DC | PRN

## 2015-06-29 NOTE — Discharge Instructions (Signed)
[facility]      [facility] [phone]  OPEN ABDOMINAL SURGERY: POST OP INSTRUCTIONS  Always review your discharge instruction sheet given to you by the facility where your surgery was performed.  IF YOU HAVE DISABILITY OR FAMILY LEAVE FORMS, YOU MUST BRING THEM TO THE OFFICE FOR PROCESSING.  PLEASE DO NOT GIVE THEM TO YOUR DOCTOR.  1. A prescription for pain medication may be given to you upon discharge.  Take your pain medication as prescribed, if needed.  If narcotic pain medicine is not needed, then you may take acetaminophen (Tylenol) or ibuprofen (Advil) as needed. 2. Take your usually prescribed medications unless otherwise directed. 3. If you need a refill on your pain medication, please contact your pharmacy. They will contact our office to request authorization.  Prescriptions will not be filled after 5pm or on week-ends. 4. You should follow a light diet the first few days after arrival home, such as soup and crackers, pudding, etc.unless your doctor has advised otherwise. A high-fiber, low fat diet can be resumed as tolerated.   Be sure to include lots of fluids daily. Most patients will experience some swelling and bruising on the chest and neck area.  Ice packs will help.  Swelling and bruising can take several days to resolve 5. Most patients will experience some swelling and bruising in the area of the incision. Ice pack will help. Swelling and bruising can take several days to resolve..  6. It is common to experience some constipation if taking pain medication after surgery.  Increasing fluid intake and taking a stool softener will usually help or prevent this problem from occurring.  A mild laxative (Milk of Magnesia or Miralax) should be taken according to package directions if there are no bowel movements after 48 hours. 7.  You may have steri-strips (small skin tapes) in place directly over the incision.  These strips should be left on the skin for 7-10 days.  If your  surgeon used skin glue on the incision, you may shower in 24 hours.  The glue will flake off over the next 2-3 weeks.  Any sutures or staples will be removed at the office during your follow-up visit. You may find that a light gauze bandage over your incision may keep your staples from being rubbed or pulled. You may shower and replace the bandage daily. 8. ACTIVITIES:  You may resume regular (light) daily activities beginning the next day--such as daily self-care, walking, climbing stairs--gradually increasing activities as tolerated.  You may have sexual intercourse when it is comfortable.  Refrain from any heavy lifting or straining until approved by your doctor. a. You may drive when you no longer are taking prescription pain medication, you can comfortably wear a seatbelt, and you can safely maneuver your car and apply brakes b. Return to Work: ___________________________________ 9. You should see your doctor in the office for a follow-up appointment approximately two weeks after your surgery.  Make sure that you call for this appointment within a day or two after you arrive home to insure a convenient appointment time. OTHER INSTRUCTIONS:  _____________________________________________________________ _____________________________________________________________  WHEN TO CALL YOUR DOCTOR: 1. Fever over 101.0 2. Inability to urinate 3. Nausea and/or vomiting 4. Extreme swelling or bruising 5. Continued bleeding from incision. 6. Increased pain, redness, or drainage from the incision. 7. Difficulty swallowing or breathing 8. Muscle cramping or spasms. 9. Numbness or tingling in hands or feet or around lips.  The clinic staff is available to   answer your questions during regular business hours.  Please don't hesitate to call and ask to speak to one of the nurses if you have concerns.  For further questions, please visit [email or web address]   

## 2015-06-29 NOTE — Progress Notes (Signed)
Patient ID: Casey Wilkinson, female   DOB: 17-Jan-1941, 74 y.o.   MRN: [id number] 6 Days Post-Op  Subjective: Doing well. Less pain. No bowel movements yesterday but passing flatus. Tolerating diet.  Objective: Vital signs in last 24 hours: Temp:  [98.4 F (36.9 C)-98.8 F (37.1 C)] 98.4 F (36.9 C) (09/08 0552) Pulse Rate:  [88-106] 88 (09/08 0552) Resp:  [20] 20 (09/08 0552) BP: (139-175)/(72-94) 141/77 mmHg (09/08 0552) SpO2:  [95 %-97 %] 96 % (09/08 0552) Last BM Date: 06/27/15  Intake/Output from previous day: 09/07 0701 - 09/08 0700 In: 720 [P.O.:720] Out: 1350 [Urine:1350] Intake/Output this shift:    General appearance: alert, cooperative and no distress GI: some incisional tenderness but generally much less tender today Incision/Wound: clean and dry  Lab Results:   Recent Labs  06/28/15 0420  WBC 4.2  HGB 9.0*  HCT 27.4*  PLT 194   BMET  Recent Labs  06/28/15 0420  NA 137  K 3.6  CL 107  CO2 25  GLUCOSE 108*  BUN <5*  CREATININE 0.65  CALCIUM 8.3*     Studies/Results: No results found.  Anti-infectives: Anti-infectives    Start     Dose/Rate Route Frequency Ordered Stop   06/23/15 1027  cefoTEtan (CEFOTAN) 2 g in dextrose 5 % 50 mL IVPB     2 g 100 mL/hr over 30 Minutes Intravenous On call to O.R. 06/23/15 1027 06/23/15 1310   06/23/15 1027  neomycin (MYCIFRADIN) tablet 1,000 mg  Status:  Discontinued     1,000 mg Oral 3 times per day 06/23/15 1027 06/23/15 1029   06/23/15 1027  erythromycin (E-MYCIN) tablet 1,000 mg  Status:  Discontinued     1,000 mg Oral 3 times per day 06/23/15 1027 06/23/15 1029      Assessment/Plan: s/p Procedure(s): LAPAROSCOPIC ILEOCECETOMY Doing well. Appears okay for discharge today.   LOS: 6 days    Helaine Yackel T 06/29/2015

## 2015-06-29 NOTE — Discharge Summary (Signed)
Patient ID: Casey Wilkinson [id number] 74 y.o. [birth year]  06/23/2015  Discharge date and time: 06/29/2015   Admitting Physician: Excell Seltzer T  Discharge Physician: Excell Seltzer T  Admission Diagnoses: malignant polyp of cecum  Discharge Diagnoses: Same  Operations: Procedure(s): LAPAROSCOPIC ILEOCECETOMY  Admission Condition: good  Discharged Condition: good  Indication for Admission: Patient recently underwent a colonoscopy after an episode of hematochezia. This was likely due to ischemic left colitis however incidentally found was a sessile polyp of the cecum. 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 After preoperative discussion regarding surgery and risks detailed extensively elsewhere she is admitted for elective laparoscopic right colectomy.  Hospital Course: On the morning of admission the patient underwent an uneventful laparoscopic right hemicolectomy. She had quite a lot of adhesions around the right colon likely from previous surgery. Postoperatively her recovery was a little bit slow but uncomplicated. She had some degree of ileus for several days with no flatus or bowel movements and was left just on sips of liquids. She also had some significant right lower quadrant tenderness for the first several days that improved prior to discharge. Hemoglobin dropped slightly but remained stable and white count normalized. She was gradually able to be advanced to a regular diet. She began having loose bowel movements. On the day of discharge she is minimally tender. Wounds are clean. Tolerating a regular diet. Final pathology showed residual serrated polyp without atypia or malignancy  Disposition: Home  Patient Instructions:    Medication List    TAKE these medications        ABC PLUS SENIOR Tabs  Take 1 tablet by mouth every morning.     albuterol 108 (90 BASE) MCG/ACT inhaler  Commonly known as:  PROVENTIL HFA;VENTOLIN HFA   Inhale 2 puffs into the lungs every 6 (six) hours as needed for wheezing or shortness of breath.     aspirin 81 MG tablet  Take 1 tablet (81 mg total) by mouth daily. Resume in 1 week.     atorvastatin 10 MG tablet  Commonly known as:  LIPITOR  Take 1 tablet by mouth every morning.     Calcium Carbonate-Vitamin D 600-400 MG-UNIT per tablet  Take 1 tablet by mouth every morning.     docusate sodium 100 MG capsule  Commonly known as:  COLACE  Take 100 mg by mouth at bedtime.     Fluticasone-Salmeterol 250-50 MCG/DOSE Aepb  Commonly known as:  ADVAIR  Inhale 1 puff into the lungs every morning.     folic acid 1 MG tablet  Commonly known as:  FOLVITE  Take 1 mg by mouth every morning.     hyoscyamine 0.125 MG SL tablet  Commonly known as:  LEVSIN SL  Place 2 tablets (0.25 mg total) under the tongue every 4 (four) hours as needed for cramping.     Loratadine 5 MG/5ML Soln  Take 10 mLs by mouth at bedtime.     methotrexate 2.5 MG tablet  Commonly known as:  RHEUMATREX  Take 7.5 mg by mouth once a week. Wednesdays     ondansetron 4 MG disintegrating tablet  Commonly known as:  ZOFRAN ODT  Take 1 tablet (4 mg total) by mouth every 8 (eight) hours as needed for nausea or vomiting.     oxyCODONE-acetaminophen 5-325 MG per tablet  Commonly known as:  PERCOCET/ROXICET  Take 1-2 tablets by mouth every 4 (four) hours as needed for moderate pain.     Potassium Gluconate  595 MG Caps  Take 1 capsule by mouth at bedtime.     traMADol 50 MG tablet  Commonly known as:  ULTRAM  Take 50 mg by mouth every 6 (six) hours as needed for moderate pain.     valsartan-hydrochlorothiazide 160-12.5 MG per tablet  Commonly known as:  DIOVAN-HCT  Take 1 tablet by mouth every morning.     vitamin B-12 500 MCG tablet  Commonly known as:  CYANOCOBALAMIN  Take 2,500 mcg by mouth every morning.        Activity: no heavy lifting for 4 weeks Diet: regular diet Wound Care: none  needed  Follow-up:  With Dr. Excell Seltzer in 3 weeks.  Signed: Edward Jolly MD, FACS  06/29/2015, 8:31 AM

## 2015-06-29 NOTE — Progress Notes (Signed)
Assessment unchanged. Pt and husband verbalized understanding through teach back including follow up care as well as when to call the Doctor. Script x 1 given as provided by MD. Discharged via wc to front entrance to meet awaiting vehicle to carry home. Accompanied by NT and husband.

## 2015-07-14 ENCOUNTER — Encounter: Payer: Self-pay | Admitting: Emergency Medicine

## 2015-07-14 ENCOUNTER — Ambulatory Visit (INDEPENDENT_AMBULATORY_CARE_PROVIDER_SITE_OTHER): Payer: Medicare Other | Admitting: Emergency Medicine

## 2015-07-14 VITALS — BP 112/60 | HR 84 | Ht 65.0 in | Wt 157.0 lb

## 2015-07-14 DIAGNOSIS — I6523 Occlusion and stenosis of bilateral carotid arteries: Secondary | ICD-10-CM | POA: Diagnosis not present

## 2015-07-14 DIAGNOSIS — J449 Chronic obstructive pulmonary disease, unspecified: Secondary | ICD-10-CM | POA: Diagnosis not present

## 2015-07-14 MED ORDER — TIOTROPIUM BROMIDE-OLODATEROL 2.5-2.5 MCG/ACT IN AERS: 2.0000 | INHALATION_SPRAY | Freq: Every day | RESPIRATORY_TRACT | Status: DC

## 2015-07-14 NOTE — Addendum Note (Signed)
Addended by: Desmond Dike C on: 07/14/2015 09:20 AM   Modules accepted: Orders

## 2015-07-14 NOTE — Assessment & Plan Note (Signed)
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 She does not have daily symptoms. Has been stable on Advair. I like to try her on an alternative combination med hopefully that will not irritate her throat. Trial change to Stiolto to see if she benefits. If so then we will continue, alternatively will go back to Advair

## 2015-07-14 NOTE — Progress Notes (Signed)
74 yo woman with HTN, former tobacco. Dx with severe AFL and severe COPD.   ROV 11/21/09 -- She is taking Advair qam instead of two times a day. Continues to be active, walks on treadmill once daily. Able to sing in the choir. No exacerbations since last visit, does not wheeze. Seems to be tolerating the Advair without much cough. 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 Seems to be tolerating.   ROV 12/06/10 -- f/u for severe COPD, also w hx of discoid SLE. annual f/u. Was treated for URI and exacerbation with steroids. No other flares in last year. Still able to exert, able to sing in the choir. Still has a bit of a raspy voice. Using Advair in the am only (keeps her from sleeping at night). Never uses her ProAir.   ROV 01/03/12 -- f/u for severe COPD, also w hx of discoid SLE and psoriasis now on MTX in an effort to get her off plaquanil. Regular follow up, says her breathing is doing ok, still sings in the choir. Still gets exertional SOB. Has a raspy voice at all times. Still on Advair qd, rarely needs ProAir.   ROV 07/02/12 -- f/u for severe COPD, also w hx of discoid SLE and psoriasis now on MTX and plaquanil.   Has been taking advair qd. Had CXR today (on MTX) >> No change compared with 11/09.   ROV 07/13/13 -- severe COPD, also w hx of discoid SLE and psoriasis now on MTX (stopped plaquanil in the last year). Last time tried spiriva, gave her a dry cough. Seems to be doing well on advair qd. No CXR yet today. No exacerbations. Good functional capacity.   ROV 07/14/14 -- follow up visit for COPD, SLE/ psoriasis on MTX. She has been doing well - no flares. She does have allergies, is using loratadine '5mg'$ . She is on Advair qd, seems to tolerate but has some scratchy voice.   ROV 07/14/15 -- history of severe obstruction from PFTs in 2010, being treated for severe COPD. She also has a history of discoid lupus and psoriasis for which she is being treated with  . She just underwent colonic resection for a newly dx colon CA. She is currently on Advair twice a day. She tells me that her breathing has done well, no exacerbations. She is not bothered by cough or hoarseness. Only limitation is climbing stairs.            Filed Vitals:   07/14/15 0855  BP: 112/60  Pulse: 84  Height: '5\' 5"'$  (1.651 m)  Weight: 157 lb (71.215 kg)  SpO2: 91%   Gen: Pleasant, well-nourished, in no distress,  normal affect  ENT: No lesions,  mouth clear,  oropharynx clear, no postnasal drip  Neck: No JVD, no TMG, no carotid bruits  Lungs: No use of accessory muscles, clear without rales or rhonchi  Cardiovascular: RRR, heart sounds normal, no murmur or gallops, no peripheral edema  Musculoskeletal: No deformities, no cyanosis or clubbing  Neuro: alert, non focal  Skin: Warm, no lesions or rashes  COPD, mild-Smoker till 2012 She has severe obstruction on pulmonary function testing which 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 She does not have daily symptoms. Has been stable on Advair. I like to try her on an alternative combination med hopefully that will not irritate her throat. Trial change to Stiolto to see  if she benefits. If so then we will continue, alternatively will go back to Advair

## 2015-07-14 NOTE — Patient Instructions (Signed)
We will do a trial of changing Advair to Stiolto 2 puffs once a day.  If you prefer this medication then we will continue it Flu shot is up-to-date Follow with Dr Lamonte Sakai in 12 months or sooner if you have any problems

## 2015-07-20 DIAGNOSIS — Z1231 Encounter for screening mammogram for malignant neoplasm of breast: Secondary | ICD-10-CM | POA: Diagnosis not present

## 2015-08-07 NOTE — Telephone Encounter (Signed)
Pt given a free prep.

## 2015-08-15 ENCOUNTER — Telehealth: Payer: Self-pay | Admitting: Emergency Medicine

## 2015-08-15 ENCOUNTER — Other Ambulatory Visit: Payer: Self-pay | Admitting: *Deleted

## 2015-08-15 MED ORDER — TIOTROPIUM BROMIDE-OLODATEROL 2.5-2.5 MCG/ACT IN AERS: 2.0000 | INHALATION_SPRAY | Freq: Every day | RESPIRATORY_TRACT | Status: DC

## 2015-08-15 NOTE — Telephone Encounter (Signed)
Spoke with pt, states she's been on Stiolto 2 puffs qam since last ov but is unsure if she is using her stiolto correctly.  Advised pt if she has questions on how to use inhaler she could either come by office or her pharmacy for directions on how to use.  Pt aware.  Nothing further needed.

## 2015-09-20 DIAGNOSIS — L570 Actinic keratosis: Secondary | ICD-10-CM | POA: Diagnosis not present

## 2015-09-20 DIAGNOSIS — D485 Neoplasm of uncertain behavior of skin: Secondary | ICD-10-CM | POA: Diagnosis not present

## 2015-09-20 DIAGNOSIS — Z79899 Other long term (current) drug therapy: Secondary | ICD-10-CM | POA: Diagnosis not present

## 2015-09-20 DIAGNOSIS — C44722 Squamous cell carcinoma of skin of right lower limb, including hip: Secondary | ICD-10-CM | POA: Diagnosis not present

## 2015-09-20 DIAGNOSIS — Z85828 Personal history of other malignant neoplasm of skin: Secondary | ICD-10-CM | POA: Diagnosis not present

## 2015-09-20 DIAGNOSIS — C44729 Squamous cell carcinoma of skin of left lower limb, including hip: Secondary | ICD-10-CM | POA: Diagnosis not present

## 2015-09-20 DIAGNOSIS — L400 Psoriasis vulgaris: Secondary | ICD-10-CM | POA: Diagnosis not present

## 2015-10-22 HISTORY — PX: CATARACT EXTRACTION W/ INTRAOCULAR LENS  IMPLANT, BILATERAL: SHX1307

## 2015-10-27 DIAGNOSIS — M859 Disorder of bone density and structure, unspecified: Secondary | ICD-10-CM | POA: Diagnosis not present

## 2015-10-27 DIAGNOSIS — R8290 Unspecified abnormal findings in urine: Secondary | ICD-10-CM | POA: Diagnosis not present

## 2015-10-27 DIAGNOSIS — I739 Peripheral vascular disease, unspecified: Secondary | ICD-10-CM | POA: Diagnosis not present

## 2015-10-27 DIAGNOSIS — D649 Anemia, unspecified: Secondary | ICD-10-CM | POA: Diagnosis not present

## 2015-10-27 DIAGNOSIS — G629 Polyneuropathy, unspecified: Secondary | ICD-10-CM | POA: Diagnosis not present

## 2015-10-27 DIAGNOSIS — E784 Other hyperlipidemia: Secondary | ICD-10-CM | POA: Diagnosis not present

## 2015-10-27 DIAGNOSIS — I10 Essential (primary) hypertension: Secondary | ICD-10-CM | POA: Diagnosis not present

## 2015-10-27 DIAGNOSIS — D6489 Other specified anemias: Secondary | ICD-10-CM | POA: Diagnosis not present

## 2015-10-27 DIAGNOSIS — N390 Urinary tract infection, site not specified: Secondary | ICD-10-CM | POA: Diagnosis not present

## 2015-10-31 DIAGNOSIS — E784 Other hyperlipidemia: Secondary | ICD-10-CM | POA: Diagnosis not present

## 2015-10-31 DIAGNOSIS — C4491 Basal cell carcinoma of skin, unspecified: Secondary | ICD-10-CM | POA: Diagnosis not present

## 2015-10-31 DIAGNOSIS — E876 Hypokalemia: Secondary | ICD-10-CM | POA: Diagnosis not present

## 2015-10-31 DIAGNOSIS — M3500 Sicca syndrome, unspecified: Secondary | ICD-10-CM | POA: Diagnosis not present

## 2015-10-31 DIAGNOSIS — Z1389 Encounter for screening for other disorder: Secondary | ICD-10-CM | POA: Diagnosis not present

## 2015-10-31 DIAGNOSIS — I700 Atherosclerosis of aorta: Secondary | ICD-10-CM | POA: Diagnosis not present

## 2015-10-31 DIAGNOSIS — C189 Malignant neoplasm of colon, unspecified: Secondary | ICD-10-CM | POA: Diagnosis not present

## 2015-10-31 DIAGNOSIS — Z0000 Encounter for general adult medical examination without abnormal findings: Secondary | ICD-10-CM | POA: Diagnosis not present

## 2015-10-31 DIAGNOSIS — G629 Polyneuropathy, unspecified: Secondary | ICD-10-CM | POA: Diagnosis not present

## 2015-10-31 DIAGNOSIS — Z6826 Body mass index (BMI) 26.0-26.9, adult: Secondary | ICD-10-CM | POA: Diagnosis not present

## 2015-11-15 DIAGNOSIS — L400 Psoriasis vulgaris: Secondary | ICD-10-CM | POA: Diagnosis not present

## 2015-11-15 DIAGNOSIS — Z85828 Personal history of other malignant neoplasm of skin: Secondary | ICD-10-CM | POA: Diagnosis not present

## 2015-11-20 DIAGNOSIS — M25562 Pain in left knee: Secondary | ICD-10-CM | POA: Diagnosis not present

## 2015-11-29 DIAGNOSIS — D485 Neoplasm of uncertain behavior of skin: Secondary | ICD-10-CM | POA: Diagnosis not present

## 2015-11-29 DIAGNOSIS — Z85828 Personal history of other malignant neoplasm of skin: Secondary | ICD-10-CM | POA: Diagnosis not present

## 2015-11-29 DIAGNOSIS — C44729 Squamous cell carcinoma of skin of left lower limb, including hip: Secondary | ICD-10-CM | POA: Diagnosis not present

## 2015-11-29 DIAGNOSIS — L400 Psoriasis vulgaris: Secondary | ICD-10-CM | POA: Diagnosis not present

## 2015-11-29 DIAGNOSIS — Z79899 Other long term (current) drug therapy: Secondary | ICD-10-CM | POA: Diagnosis not present

## 2015-12-07 DIAGNOSIS — H25013 Cortical age-related cataract, bilateral: Secondary | ICD-10-CM | POA: Diagnosis not present

## 2015-12-07 DIAGNOSIS — H2513 Age-related nuclear cataract, bilateral: Secondary | ICD-10-CM | POA: Diagnosis not present

## 2015-12-07 DIAGNOSIS — H25011 Cortical age-related cataract, right eye: Secondary | ICD-10-CM | POA: Diagnosis not present

## 2015-12-07 DIAGNOSIS — H2511 Age-related nuclear cataract, right eye: Secondary | ICD-10-CM | POA: Diagnosis not present

## 2015-12-11 ENCOUNTER — Telehealth: Payer: Self-pay | Admitting: Emergency Medicine

## 2015-12-11 NOTE — Telephone Encounter (Signed)
[facility] has been added to pt chart. NO cb needed per note. Will sign off.

## 2015-12-12 ENCOUNTER — Other Ambulatory Visit: Payer: Self-pay | Admitting: *Deleted

## 2015-12-12 MED ORDER — TIOTROPIUM BROMIDE-OLODATEROL 2.5-2.5 MCG/ACT IN AERS: 2.0000 | INHALATION_SPRAY | Freq: Every day | RESPIRATORY_TRACT | Status: DC

## 2015-12-14 ENCOUNTER — Other Ambulatory Visit: Payer: Self-pay | Admitting: Orthopaedic Surgery

## 2015-12-14 DIAGNOSIS — M25562 Pain in left knee: Secondary | ICD-10-CM | POA: Diagnosis not present

## 2015-12-24 ENCOUNTER — Ambulatory Visit
Admission: RE | Admit: 2015-12-24 | Discharge: 2015-12-24 | Disposition: A | Discharge status: Home / Self Care | Payer: Medicare Other | Source: Ambulatory Visit | Attending: Orthopaedic Surgery | Admitting: Orthopaedic Surgery

## 2015-12-24 DIAGNOSIS — M25562 Pain in left knee: Secondary | ICD-10-CM

## 2015-12-24 DIAGNOSIS — S83242A Other tear of medial meniscus, current injury, left knee, initial encounter: Secondary | ICD-10-CM | POA: Diagnosis not present

## 2015-12-26 DIAGNOSIS — M1712 Unilateral primary osteoarthritis, left knee: Secondary | ICD-10-CM | POA: Diagnosis not present

## 2015-12-26 DIAGNOSIS — L4050 Arthropathic psoriasis, unspecified: Secondary | ICD-10-CM | POA: Diagnosis not present

## 2015-12-27 DIAGNOSIS — H25011 Cortical age-related cataract, right eye: Secondary | ICD-10-CM | POA: Diagnosis not present

## 2015-12-27 DIAGNOSIS — H2512 Age-related nuclear cataract, left eye: Secondary | ICD-10-CM | POA: Diagnosis not present

## 2015-12-27 DIAGNOSIS — H25012 Cortical age-related cataract, left eye: Secondary | ICD-10-CM | POA: Diagnosis not present

## 2015-12-27 DIAGNOSIS — H2511 Age-related nuclear cataract, right eye: Secondary | ICD-10-CM | POA: Diagnosis not present

## 2016-01-02 DIAGNOSIS — L570 Actinic keratosis: Secondary | ICD-10-CM | POA: Diagnosis not present

## 2016-01-02 DIAGNOSIS — Z85828 Personal history of other malignant neoplasm of skin: Secondary | ICD-10-CM | POA: Diagnosis not present

## 2016-01-02 DIAGNOSIS — C44729 Squamous cell carcinoma of skin of left lower limb, including hip: Secondary | ICD-10-CM | POA: Diagnosis not present

## 2016-01-02 DIAGNOSIS — D485 Neoplasm of uncertain behavior of skin: Secondary | ICD-10-CM | POA: Diagnosis not present

## 2016-01-03 DIAGNOSIS — H2512 Age-related nuclear cataract, left eye: Secondary | ICD-10-CM | POA: Diagnosis not present

## 2016-01-03 DIAGNOSIS — H25012 Cortical age-related cataract, left eye: Secondary | ICD-10-CM | POA: Diagnosis not present

## 2016-01-17 ENCOUNTER — Other Ambulatory Visit: Payer: Self-pay | Admitting: Orthopaedic Surgery

## 2016-01-19 ENCOUNTER — Encounter (HOSPITAL_COMMUNITY): Payer: Self-pay

## 2016-01-19 ENCOUNTER — Encounter (HOSPITAL_COMMUNITY)
Admission: RE | Admit: 2016-01-19 | Discharge: 2016-01-19 | Disposition: A | Discharge status: Home / Self Care | Payer: Medicare Other | Source: Ambulatory Visit | Attending: Orthopaedic Surgery | Admitting: Orthopaedic Surgery

## 2016-01-19 DIAGNOSIS — Z01812 Encounter for preprocedural laboratory examination: Secondary | ICD-10-CM | POA: Insufficient documentation

## 2016-01-19 DIAGNOSIS — M1712 Unilateral primary osteoarthritis, left knee: Secondary | ICD-10-CM | POA: Diagnosis not present

## 2016-01-19 DIAGNOSIS — Z0183 Encounter for blood typing: Secondary | ICD-10-CM | POA: Insufficient documentation

## 2016-01-19 HISTORY — DX: Personal history of other medical treatment: Z92.89

## 2016-01-19 HISTORY — DX: Occlusion and stenosis of unspecified carotid artery: I65.29

## 2016-01-19 HISTORY — DX: Reserved for inherently not codable concepts without codable children: IMO0001

## 2016-01-19 LAB — CBC WITH DIFFERENTIAL/PLATELET
Basophils Absolute: 0 10*3/uL (ref 0.0–0.1)
Basophils Relative: 1 %
EOS ABS: 0.1 10*3/uL (ref 0.0–0.7)
EOS PCT: 2 %
HCT: 31.8 % — ABNORMAL LOW (ref 36.0–46.0)
HEMOGLOBIN: 10.6 g/dL — AB (ref 12.0–15.0)
Lymphocytes Relative: 14 %
Lymphs Abs: 0.7 10*3/uL (ref 0.7–4.0)
MCH: 31.1 pg (ref 26.0–34.0)
MCHC: 33.3 g/dL (ref 30.0–36.0)
MCV: 93.3 fL (ref 78.0–100.0)
Monocytes Absolute: 0.4 10*3/uL (ref 0.1–1.0)
Monocytes Relative: 8 %
NEUTROS PCT: 77 %
Neutro Abs: 4.2 10*3/uL (ref 1.7–7.7)
Platelets: 183 10*3/uL (ref 150–400)
RBC: 3.41 MIL/uL — ABNORMAL LOW (ref 3.87–5.11)
RDW: 15.8 % — ABNORMAL HIGH (ref 11.5–15.5)
WBC: 5.4 10*3/uL (ref 4.0–10.5)

## 2016-01-19 LAB — COMPREHENSIVE METABOLIC PANEL
ALK PHOS: 145 U/L — AB (ref 38–126)
ALT: 15 U/L (ref 14–54)
ANION GAP: 9 (ref 5–15)
AST: 26 U/L (ref 15–41)
Albumin: 3.5 g/dL (ref 3.5–5.0)
BUN: 19 mg/dL (ref 6–20)
CALCIUM: 9.6 mg/dL (ref 8.9–10.3)
CO2: 25 mmol/L (ref 22–32)
CREATININE: 0.79 mg/dL (ref 0.44–1.00)
Chloride: 104 mmol/L (ref 101–111)
Glucose, Bld: 91 mg/dL (ref 65–99)
Potassium: 4 mmol/L (ref 3.5–5.1)
SODIUM: 138 mmol/L (ref 135–145)
Total Bilirubin: 0.8 mg/dL (ref 0.3–1.2)
Total Protein: 8.7 g/dL — ABNORMAL HIGH (ref 6.5–8.1)

## 2016-01-19 LAB — URINALYSIS, ROUTINE W REFLEX MICROSCOPIC
Bilirubin Urine: NEGATIVE
GLUCOSE, UA: NEGATIVE mg/dL
HGB URINE DIPSTICK: NEGATIVE
Ketones, ur: NEGATIVE mg/dL
Nitrite: NEGATIVE
PH: 6.5 (ref 5.0–8.0)
PROTEIN: NEGATIVE mg/dL
SPECIFIC GRAVITY, URINE: 1.021 (ref 1.005–1.030)

## 2016-01-19 LAB — URINE MICROSCOPIC-ADD ON

## 2016-01-19 LAB — PROTIME-INR
INR: 1.09 (ref 0.00–1.49)
PROTHROMBIN TIME: 14.3 s (ref 11.6–15.2)

## 2016-01-19 LAB — APTT: aPTT: 27 seconds (ref 24–37)

## 2016-01-19 LAB — C-REACTIVE PROTEIN: CRP: 2.5 mg/dL — ABNORMAL HIGH (ref <1.0)

## 2016-01-19 LAB — SURGICAL PCR SCREEN
MRSA, PCR: NEGATIVE
Staphylococcus aureus: NEGATIVE

## 2016-01-19 LAB — SEDIMENTATION RATE: SED RATE: 112 mm/h — AB (ref 0–22)

## 2016-01-19 NOTE — Pre-Procedure Instructions (Signed)
    Casey Wilkinson  01/19/2016    Your procedure is scheduled on Wednesday, April 12.  Report to [facility] Admitting at 10:45 A.M.                Your surgery or procedure is scheduled for 12:45 PM   Call this number if you have problems the morning of surgery:[phone]                For any other questions, please call [phone], Monday - Friday 8 AM - 4 PM.   Remember:  Do not eat food or drink liquids after midnight Tuesday, April 11.  Take these medicines the morning of surgery with A SIP OF WATER : Atorvastatin, use inhalers.               May take pain and nausea medication if needed.                         Tuesday, April 11, STOP taking Aspirin, Vitamins. Do not take any Aspirin Products, Ibuprofen (Advil)  or Naproxen (Aleve)    Do not wear jewelry, make-up or nail polish.  Do not wear lotions, powders, or perfumes.  Do not shave 48 hours prior to surgery.    Do not bring valuables to the hospital.  [facility] is not responsible for any belongings or valuables.  Contacts, dentures or bridgework may not be worn into surgery.  Leave your suitcase in the car.  After surgery it may be brought to your room.  For patients admitted to the hospital, discharge time will be determined by your treatment team  Special instructions:  Review   - Preparing For Surgery.  Please read over the following fact sheets that you were given. Pain Booklet, Coughing and Deep Breathing, Blood Transfusion Information, MRSA Information and Surgical Site Infection Prevention, Incentive Spiormetery

## 2016-01-30 MED ORDER — CEFAZOLIN SODIUM-DEXTROSE 2-4 GM/100ML-% IV SOLN
2.0000 g | INTRAVENOUS | Status: AC
  Administered 2016-01-31: 2 g via INTRAVENOUS
  Filled 2016-01-30: qty 100

## 2016-01-30 MED ORDER — TRANEXAMIC ACID 1000 MG/10ML IV SOLN
1000.0000 mg | INTRAVENOUS | Status: AC
  Administered 2016-01-31: 1000 mg via INTRAVENOUS
  Filled 2016-01-30: qty 10

## 2016-01-31 ENCOUNTER — Inpatient Hospital Stay (HOSPITAL_COMMUNITY): Payer: Medicare Other

## 2016-01-31 ENCOUNTER — Inpatient Hospital Stay (HOSPITAL_COMMUNITY): Payer: Medicare Other | Admitting: Critical Care Medicine

## 2016-01-31 ENCOUNTER — Encounter (HOSPITAL_COMMUNITY): Payer: Self-pay | Admitting: Critical Care Medicine

## 2016-01-31 ENCOUNTER — Encounter (HOSPITAL_COMMUNITY)
Admission: RE | Disposition: A | Discharge status: Home Health | Payer: Self-pay | Source: Ambulatory Visit | Attending: Orthopaedic Surgery

## 2016-01-31 ENCOUNTER — Inpatient Hospital Stay (HOSPITAL_COMMUNITY)
Admission: RE | Admit: 2016-01-31 | Discharge: 2016-02-02 | DRG: 470 | Disposition: A | Discharge status: Home Health | Payer: Medicare Other | Source: Ambulatory Visit | Attending: Orthopaedic Surgery | Admitting: Orthopaedic Surgery

## 2016-01-31 DIAGNOSIS — M1712 Unilateral primary osteoarthritis, left knee: Secondary | ICD-10-CM | POA: Diagnosis not present

## 2016-01-31 DIAGNOSIS — Z9842 Cataract extraction status, left eye: Secondary | ICD-10-CM

## 2016-01-31 DIAGNOSIS — Z888 Allergy status to other drugs, medicaments and biological substances status: Secondary | ICD-10-CM | POA: Diagnosis not present

## 2016-01-31 DIAGNOSIS — Z90710 Acquired absence of both cervix and uterus: Secondary | ICD-10-CM

## 2016-01-31 DIAGNOSIS — Z96659 Presence of unspecified artificial knee joint: Secondary | ICD-10-CM

## 2016-01-31 DIAGNOSIS — Z882 Allergy status to sulfonamides status: Secondary | ICD-10-CM

## 2016-01-31 DIAGNOSIS — Z7982 Long term (current) use of aspirin: Secondary | ICD-10-CM

## 2016-01-31 DIAGNOSIS — Z961 Presence of intraocular lens: Secondary | ICD-10-CM | POA: Diagnosis present

## 2016-01-31 DIAGNOSIS — L930 Discoid lupus erythematosus: Secondary | ICD-10-CM | POA: Diagnosis present

## 2016-01-31 DIAGNOSIS — Z79899 Other long term (current) drug therapy: Secondary | ICD-10-CM | POA: Diagnosis not present

## 2016-01-31 DIAGNOSIS — D62 Acute posthemorrhagic anemia: Secondary | ICD-10-CM | POA: Diagnosis not present

## 2016-01-31 DIAGNOSIS — I6529 Occlusion and stenosis of unspecified carotid artery: Secondary | ICD-10-CM | POA: Diagnosis present

## 2016-01-31 DIAGNOSIS — I739 Peripheral vascular disease, unspecified: Secondary | ICD-10-CM | POA: Diagnosis present

## 2016-01-31 DIAGNOSIS — Z9841 Cataract extraction status, right eye: Secondary | ICD-10-CM

## 2016-01-31 DIAGNOSIS — J449 Chronic obstructive pulmonary disease, unspecified: Secondary | ICD-10-CM | POA: Diagnosis not present

## 2016-01-31 DIAGNOSIS — I10 Essential (primary) hypertension: Secondary | ICD-10-CM | POA: Diagnosis present

## 2016-01-31 DIAGNOSIS — M179 Osteoarthritis of knee, unspecified: Secondary | ICD-10-CM | POA: Diagnosis not present

## 2016-01-31 DIAGNOSIS — Z471 Aftercare following joint replacement surgery: Secondary | ICD-10-CM | POA: Diagnosis not present

## 2016-01-31 DIAGNOSIS — Z881 Allergy status to other antibiotic agents status: Secondary | ICD-10-CM | POA: Diagnosis not present

## 2016-01-31 DIAGNOSIS — Z87891 Personal history of nicotine dependence: Secondary | ICD-10-CM

## 2016-01-31 DIAGNOSIS — G8918 Other acute postprocedural pain: Secondary | ICD-10-CM | POA: Diagnosis not present

## 2016-01-31 DIAGNOSIS — Z96652 Presence of left artificial knee joint: Secondary | ICD-10-CM | POA: Diagnosis not present

## 2016-01-31 HISTORY — PX: TOTAL KNEE ARTHROPLASTY: SHX125

## 2016-01-31 SURGERY — ARTHROPLASTY, KNEE, TOTAL
Anesthesia: Regional | Site: Knee | Laterality: Left

## 2016-01-31 MED ORDER — CHLORHEXIDINE GLUCONATE 4 % EX LIQD: 60.0000 mL | Freq: Once | CUTANEOUS | Status: DC

## 2016-01-31 MED ORDER — LORATADINE 5 MG/5ML PO SOLN: 10.0000 mL | Freq: Every day | ORAL | Status: DC

## 2016-01-31 MED ORDER — SODIUM CHLORIDE 0.9 % IJ SOLN
INTRAMUSCULAR | Status: DC | PRN
  Administered 2016-01-31: 40 mL via INTRAVENOUS

## 2016-01-31 MED ORDER — SENNOSIDES-DOCUSATE SODIUM 8.6-50 MG PO TABS: 1.0000 | ORAL_TABLET | Freq: Every evening | ORAL | Status: DC | PRN

## 2016-01-31 MED ORDER — DEXTROSE 5 % IV SOLN
10.0000 mg | INTRAVENOUS | Status: DC | PRN
  Administered 2016-01-31: 20 ug/min via INTRAVENOUS

## 2016-01-31 MED ORDER — ACETAMINOPHEN 500 MG PO TABS
1000.0000 mg | ORAL_TABLET | Freq: Four times a day (QID) | ORAL | Status: AC
  Administered 2016-01-31 – 2016-02-01 (×4): 1000 mg via ORAL
  Filled 2016-01-31 (×5): qty 2

## 2016-01-31 MED ORDER — ATORVASTATIN CALCIUM 10 MG PO TABS
10.0000 mg | ORAL_TABLET | Freq: Every morning | ORAL | Status: DC
  Administered 2016-02-01 – 2016-02-02 (×2): 10 mg via ORAL
  Filled 2016-01-31 (×2): qty 1

## 2016-01-31 MED ORDER — ONDANSETRON HCL 4 MG/2ML IJ SOLN: 4.0000 mg | Freq: Four times a day (QID) | INTRAMUSCULAR | Status: DC | PRN

## 2016-01-31 MED ORDER — HYDROCHLOROTHIAZIDE 12.5 MG PO CAPS
12.5000 mg | ORAL_CAPSULE | Freq: Every day | ORAL | Status: DC
  Administered 2016-02-01 – 2016-02-02 (×2): 12.5 mg via ORAL
  Filled 2016-01-31 (×2): qty 1

## 2016-01-31 MED ORDER — GLYCOPYRROLATE 0.2 MG/ML IJ SOLN
INTRAMUSCULAR | Status: DC | PRN
  Administered 2016-01-31: 0.6 mg via INTRAVENOUS

## 2016-01-31 MED ORDER — ASPIRIN EC 325 MG PO TBEC
325.0000 mg | DELAYED_RELEASE_TABLET | Freq: Two times a day (BID) | ORAL | Status: DC
Start: 2016-01-31 — End: 2016-01-31

## 2016-01-31 MED ORDER — TRAMADOL HCL 50 MG PO TABS
50.0000 mg | ORAL_TABLET | Freq: Four times a day (QID) | ORAL | Status: DC | PRN
  Administered 2016-01-31: 50 mg via ORAL
  Filled 2016-01-31: qty 1

## 2016-01-31 MED ORDER — OXYCODONE HCL 5 MG/5ML PO SOLN: 5.0000 mg | Freq: Once | ORAL | Status: AC | PRN

## 2016-01-31 MED ORDER — OXYCODONE HCL ER 10 MG PO T12A: 10.0000 mg | EXTENDED_RELEASE_TABLET | Freq: Two times a day (BID) | ORAL | Status: DC

## 2016-01-31 MED ORDER — SORBITOL 70 % SOLN: 30.0000 mL | Freq: Every day | Status: DC | PRN

## 2016-01-31 MED ORDER — OXYCODONE HCL ER 10 MG PO T12A
10.0000 mg | EXTENDED_RELEASE_TABLET | Freq: Two times a day (BID) | ORAL | Status: DC
Start: 2016-01-31 — End: 2016-02-02
  Administered 2016-01-31 – 2016-02-02 (×3): 10 mg via ORAL
  Filled 2016-01-31 (×4): qty 1

## 2016-01-31 MED ORDER — OXYCODONE HCL 5 MG PO TABS: 5.0000 mg | ORAL_TABLET | ORAL | Status: DC | PRN

## 2016-01-31 MED ORDER — 0.9 % SODIUM CHLORIDE (POUR BTL) OPTIME
TOPICAL | Status: DC | PRN
  Administered 2016-01-31: 1000 mL

## 2016-01-31 MED ORDER — NEOSTIGMINE METHYLSULFATE 10 MG/10ML IV SOLN
INTRAVENOUS | Status: DC | PRN
  Administered 2016-01-31: 4 mg via INTRAVENOUS

## 2016-01-31 MED ORDER — ALUM & MAG HYDROXIDE-SIMETH 200-200-20 MG/5ML PO SUSP
30.0000 mL | ORAL | Status: DC | PRN
  Administered 2016-02-01 – 2016-02-02 (×2): 30 mL via ORAL
  Filled 2016-01-31 (×2): qty 30

## 2016-01-31 MED ORDER — ONDANSETRON 4 MG PO TBDP
4.0000 mg | ORAL_TABLET | Freq: Three times a day (TID) | ORAL | Status: DC | PRN
  Filled 2016-01-31: qty 1

## 2016-01-31 MED ORDER — DEXAMETHASONE SODIUM PHOSPHATE 4 MG/ML IJ SOLN
INTRAMUSCULAR | Status: DC | PRN
  Administered 2016-01-31: 4 mg via INTRAVENOUS

## 2016-01-31 MED ORDER — BUPIVACAINE LIPOSOME 1.3 % IJ SUSP
20.0000 mL | INTRAMUSCULAR | Status: AC
  Administered 2016-01-31: 20 mL
  Filled 2016-01-31: qty 20

## 2016-01-31 MED ORDER — OXYCODONE HCL 5 MG PO TABS
ORAL_TABLET | ORAL | Status: AC
  Filled 2016-01-31: qty 1

## 2016-01-31 MED ORDER — KETOROLAC TROMETHAMINE 30 MG/ML IJ SOLN: 30.0000 mg | Freq: Four times a day (QID) | INTRAMUSCULAR | Status: AC | PRN

## 2016-01-31 MED ORDER — FENTANYL CITRATE (PF) 250 MCG/5ML IJ SOLN
INTRAMUSCULAR | Status: AC
  Filled 2016-01-31: qty 5

## 2016-01-31 MED ORDER — POLYETHYLENE GLYCOL 3350 17 G PO PACK: 17.0000 g | PACK | Freq: Every day | ORAL | Status: DC | PRN

## 2016-01-31 MED ORDER — PROPOFOL 10 MG/ML IV BOLUS
INTRAVENOUS | Status: DC | PRN
  Administered 2016-01-31: 120 mg via INTRAVENOUS

## 2016-01-31 MED ORDER — ABC PLUS SENIOR PO TABS: 1.0000 | ORAL_TABLET | Freq: Every morning | ORAL | Status: DC

## 2016-01-31 MED ORDER — METHOCARBAMOL 500 MG PO TABS
500.0000 mg | ORAL_TABLET | Freq: Four times a day (QID) | ORAL | Status: DC | PRN
  Administered 2016-01-31 – 2016-02-02 (×4): 500 mg via ORAL
  Filled 2016-01-31 (×3): qty 1

## 2016-01-31 MED ORDER — DEXAMETHASONE SODIUM PHOSPHATE 10 MG/ML IJ SOLN
10.0000 mg | Freq: Once | INTRAMUSCULAR | Status: AC
  Administered 2016-02-01: 10 mg via INTRAVENOUS
  Filled 2016-01-31: qty 1

## 2016-01-31 MED ORDER — METHOCARBAMOL 500 MG PO TABS
ORAL_TABLET | ORAL | Status: AC
  Filled 2016-01-31: qty 1

## 2016-01-31 MED ORDER — ONDANSETRON HCL 4 MG PO TABS: 4.0000 mg | ORAL_TABLET | Freq: Three times a day (TID) | ORAL | Status: DC | PRN

## 2016-01-31 MED ORDER — CEFAZOLIN SODIUM-DEXTROSE 2-4 GM/100ML-% IV SOLN
2.0000 g | Freq: Four times a day (QID) | INTRAVENOUS | Status: AC
  Administered 2016-01-31 – 2016-02-01 (×2): 2 g via INTRAVENOUS
  Filled 2016-01-31 (×2): qty 100

## 2016-01-31 MED ORDER — CALCIUM CARBONATE-VITAMIN D 500-200 MG-UNIT PO TABS
1.0000 | ORAL_TABLET | Freq: Every day | ORAL | Status: DC
  Administered 2016-02-01 – 2016-02-02 (×2): 1 via ORAL
  Filled 2016-01-31 (×2): qty 1

## 2016-01-31 MED ORDER — EPHEDRINE SULFATE 50 MG/ML IJ SOLN
INTRAMUSCULAR | Status: AC
  Filled 2016-01-31: qty 2

## 2016-01-31 MED ORDER — FENTANYL CITRATE (PF) 100 MCG/2ML IJ SOLN
25.0000 ug | INTRAMUSCULAR | Status: DC | PRN
  Administered 2016-01-31: 50 ug via INTRAVENOUS

## 2016-01-31 MED ORDER — ONDANSETRON HCL 4 MG/2ML IJ SOLN
INTRAMUSCULAR | Status: DC | PRN
  Administered 2016-01-31: 4 mg via INTRAVENOUS

## 2016-01-31 MED ORDER — ROCURONIUM BROMIDE 50 MG/5ML IV SOLN
INTRAVENOUS | Status: AC
  Filled 2016-01-31: qty 1

## 2016-01-31 MED ORDER — ONDANSETRON HCL 4 MG PO TABS: 4.0000 mg | ORAL_TABLET | Freq: Four times a day (QID) | ORAL | Status: DC | PRN

## 2016-01-31 MED ORDER — BUPIVACAINE-EPINEPHRINE (PF) 0.5% -1:200000 IJ SOLN
INTRAMUSCULAR | Status: DC | PRN
  Administered 2016-01-31: 25 mL via PERINEURAL

## 2016-01-31 MED ORDER — CALCIUM CARBONATE-VITAMIN D 600-400 MG-UNIT PO TABS: 1.0000 | ORAL_TABLET | Freq: Every morning | ORAL | Status: DC

## 2016-01-31 MED ORDER — METOCLOPRAMIDE HCL 5 MG/ML IJ SOLN: 5.0000 mg | Freq: Three times a day (TID) | INTRAMUSCULAR | Status: DC | PRN

## 2016-01-31 MED ORDER — SODIUM CHLORIDE 0.9 % IR SOLN
Status: DC | PRN
  Administered 2016-01-31: 3000 mL

## 2016-01-31 MED ORDER — POLYETHYLENE GLYCOL 3350 17 G PO PACK
17.0000 g | PACK | Freq: Every day | ORAL | Status: DC | PRN
Start: 2016-01-31 — End: 2016-02-02

## 2016-01-31 MED ORDER — MIDAZOLAM HCL 5 MG/5ML IJ SOLN
INTRAMUSCULAR | Status: DC | PRN
  Administered 2016-01-31: 1 mg via INTRAVENOUS

## 2016-01-31 MED ORDER — ROCURONIUM BROMIDE 100 MG/10ML IV SOLN
INTRAVENOUS | Status: DC | PRN
  Administered 2016-01-31: 10 mg via INTRAVENOUS
  Administered 2016-01-31: 40 mg via INTRAVENOUS

## 2016-01-31 MED ORDER — FENTANYL CITRATE (PF) 100 MCG/2ML IJ SOLN
50.0000 ug | Freq: Once | INTRAMUSCULAR | Status: AC
  Administered 2016-01-31: 50 ug via INTRAVENOUS

## 2016-01-31 MED ORDER — FENTANYL CITRATE (PF) 100 MCG/2ML IJ SOLN
INTRAMUSCULAR | Status: AC
  Administered 2016-01-31: 50 ug via INTRAVENOUS
  Filled 2016-01-31: qty 2

## 2016-01-31 MED ORDER — ACETAMINOPHEN 325 MG PO TABS
650.0000 mg | ORAL_TABLET | Freq: Four times a day (QID) | ORAL | Status: DC | PRN
Start: 2016-01-31 — End: 2016-02-02

## 2016-01-31 MED ORDER — DEXTROSE 5 % IV SOLN
500.0000 mg | Freq: Four times a day (QID) | INTRAVENOUS | Status: DC | PRN
  Filled 2016-01-31: qty 5

## 2016-01-31 MED ORDER — IRBESARTAN 150 MG PO TABS
150.0000 mg | ORAL_TABLET | Freq: Every day | ORAL | Status: DC
  Administered 2016-02-01 – 2016-02-02 (×2): 150 mg via ORAL
  Filled 2016-01-31 (×2): qty 1

## 2016-01-31 MED ORDER — OXYCODONE HCL 5 MG PO TABS
5.0000 mg | ORAL_TABLET | ORAL | Status: DC | PRN
Start: 2016-01-31 — End: 2016-02-02
  Administered 2016-01-31: 5 mg via ORAL
  Administered 2016-02-01 (×2): 10 mg via ORAL
  Administered 2016-02-01: 5 mg via ORAL
  Administered 2016-02-01: 10 mg via ORAL
  Administered 2016-02-01: 5 mg via ORAL
  Administered 2016-02-02 (×2): 10 mg via ORAL
  Filled 2016-01-31 (×2): qty 2
  Filled 2016-01-31 (×2): qty 1
  Filled 2016-01-31 (×3): qty 2
  Filled 2016-01-31: qty 1

## 2016-01-31 MED ORDER — ASPIRIN EC 325 MG PO TBEC
325.0000 mg | DELAYED_RELEASE_TABLET | Freq: Two times a day (BID) | ORAL | Status: DC
  Administered 2016-01-31 – 2016-02-02 (×4): 325 mg via ORAL
  Filled 2016-01-31 (×4): qty 1

## 2016-01-31 MED ORDER — MAGNESIUM CITRATE PO SOLN: 1.0000 | Freq: Once | ORAL | Status: DC | PRN

## 2016-01-31 MED ORDER — MIDAZOLAM HCL 2 MG/2ML IJ SOLN
INTRAMUSCULAR | Status: AC
Start: 2016-01-31 — End: 2016-01-31
  Administered 2016-01-31: 1 mg
  Filled 2016-01-31: qty 2

## 2016-01-31 MED ORDER — POVIDONE-IODINE 10 % EX SOLN
CUTANEOUS | Status: DC | PRN
  Administered 2016-01-31: 1 via TOPICAL

## 2016-01-31 MED ORDER — TRANEXAMIC ACID 1000 MG/10ML IV SOLN
2000.0000 mg | INTRAVENOUS | Status: AC
  Administered 2016-01-31: 2000 mg via TOPICAL
  Filled 2016-01-31: qty 20

## 2016-01-31 MED ORDER — METHOCARBAMOL 750 MG PO TABS: 750.0000 mg | ORAL_TABLET | Freq: Two times a day (BID) | ORAL | Status: DC | PRN

## 2016-01-31 MED ORDER — PHENOL 1.4 % MT LIQD
1.0000 | OROMUCOSAL | Status: DC | PRN
Start: 2016-01-31 — End: 2016-02-02

## 2016-01-31 MED ORDER — VITAMIN B-12 500 MCG PO TABS: 2500.0000 ug | ORAL_TABLET | Freq: Every morning | ORAL | Status: DC

## 2016-01-31 MED ORDER — OXYCODONE HCL 5 MG PO TABS
5.0000 mg | ORAL_TABLET | Freq: Once | ORAL | Status: AC | PRN
  Administered 2016-01-31: 5 mg via ORAL

## 2016-01-31 MED ORDER — DEXAMETHASONE SODIUM PHOSPHATE 4 MG/ML IJ SOLN
INTRAMUSCULAR | Status: AC
  Filled 2016-01-31: qty 1

## 2016-01-31 MED ORDER — LACTATED RINGERS IV SOLN
INTRAVENOUS | Status: DC
  Administered 2016-01-31 (×2): via INTRAVENOUS

## 2016-01-31 MED ORDER — ASPIRIN EC 325 MG PO TBEC: 325.0000 mg | DELAYED_RELEASE_TABLET | Freq: Two times a day (BID) | ORAL | Status: DC

## 2016-01-31 MED ORDER — MIDAZOLAM HCL 2 MG/2ML IJ SOLN: 1.0000 mg | Freq: Once | INTRAMUSCULAR | Status: DC

## 2016-01-31 MED ORDER — ADULT MULTIVITAMIN W/MINERALS CH
1.0000 | ORAL_TABLET | Freq: Every day | ORAL | Status: DC
  Administered 2016-02-01 – 2016-02-02 (×2): 1 via ORAL
  Filled 2016-01-31 (×2): qty 1

## 2016-01-31 MED ORDER — DOCUSATE SODIUM 100 MG PO CAPS
100.0000 mg | ORAL_CAPSULE | Freq: Every day | ORAL | Status: DC
  Administered 2016-01-31 – 2016-02-01 (×2): 100 mg via ORAL
  Filled 2016-01-31 (×2): qty 1

## 2016-01-31 MED ORDER — ACETAMINOPHEN 650 MG RE SUPP: 650.0000 mg | Freq: Four times a day (QID) | RECTAL | Status: DC | PRN

## 2016-01-31 MED ORDER — VALSARTAN-HYDROCHLOROTHIAZIDE 160-12.5 MG PO TABS: 1.0000 | ORAL_TABLET | Freq: Every morning | ORAL | Status: DC

## 2016-01-31 MED ORDER — ALBUTEROL SULFATE (2.5 MG/3ML) 0.083% IN NEBU: 3.0000 mL | INHALATION_SOLUTION | Freq: Four times a day (QID) | RESPIRATORY_TRACT | Status: DC | PRN

## 2016-01-31 MED ORDER — METHOTREXATE 2.5 MG PO TABS: 15.0000 mg | ORAL_TABLET | ORAL | Status: DC

## 2016-01-31 MED ORDER — SODIUM CHLORIDE 0.9 % IJ SOLN
INTRAMUSCULAR | Status: AC
  Filled 2016-01-31: qty 30

## 2016-01-31 MED ORDER — VITAMIN B-12 1000 MCG PO TABS
2500.0000 ug | ORAL_TABLET | Freq: Every day | ORAL | Status: DC
  Administered 2016-02-01 – 2016-02-02 (×2): 2500 ug via ORAL
  Filled 2016-01-31 (×2): qty 3

## 2016-01-31 MED ORDER — TRANEXAMIC ACID 1000 MG/10ML IV SOLN
1000.0000 mg | Freq: Once | INTRAVENOUS | Status: AC
  Administered 2016-01-31: 1000 mg via INTRAVENOUS
  Filled 2016-01-31: qty 10

## 2016-01-31 MED ORDER — MIDAZOLAM HCL 2 MG/2ML IJ SOLN
INTRAMUSCULAR | Status: AC
  Filled 2016-01-31: qty 2

## 2016-01-31 MED ORDER — POTASSIUM GLUCONATE 595 MG PO CAPS: 1.0000 | ORAL_CAPSULE | Freq: Every day | ORAL | Status: DC

## 2016-01-31 MED ORDER — METOCLOPRAMIDE HCL 5 MG PO TABS: 5.0000 mg | ORAL_TABLET | Freq: Three times a day (TID) | ORAL | Status: DC | PRN

## 2016-01-31 MED ORDER — LIDOCAINE HCL (CARDIAC) 20 MG/ML IV SOLN
INTRAVENOUS | Status: DC | PRN
  Administered 2016-01-31: 50 mg via INTRATRACHEAL

## 2016-01-31 MED ORDER — LORATADINE 5 MG/5ML PO SYRP
10.0000 mg | ORAL_SOLUTION | Freq: Every day | ORAL | Status: DC
  Administered 2016-02-01 – 2016-02-02 (×2): 10 mg via ORAL
  Filled 2016-01-31 (×2): qty 10

## 2016-01-31 MED ORDER — FENTANYL CITRATE (PF) 100 MCG/2ML IJ SOLN
INTRAMUSCULAR | Status: DC | PRN
  Administered 2016-01-31 (×3): 50 ug via INTRAVENOUS
  Administered 2016-01-31: 100 ug via INTRAVENOUS
  Administered 2016-01-31: 50 ug via INTRAVENOUS

## 2016-01-31 MED ORDER — SODIUM CHLORIDE 0.9 % IV SOLN
INTRAVENOUS | Status: DC
  Administered 2016-01-31 – 2016-02-01 (×3): via INTRAVENOUS

## 2016-01-31 MED ORDER — PROPOFOL 10 MG/ML IV BOLUS
INTRAVENOUS | Status: AC
  Filled 2016-01-31: qty 20

## 2016-01-31 MED ORDER — MORPHINE SULFATE (PF) 2 MG/ML IV SOLN: 1.0000 mg | INTRAVENOUS | Status: DC | PRN

## 2016-01-31 MED ORDER — FOLIC ACID 1 MG PO TABS
1.0000 mg | ORAL_TABLET | Freq: Every morning | ORAL | Status: DC
  Administered 2016-02-01 – 2016-02-02 (×2): 1 mg via ORAL
  Filled 2016-01-31 (×2): qty 1

## 2016-01-31 MED ORDER — MENTHOL 3 MG MT LOZG
1.0000 | LOZENGE | OROMUCOSAL | Status: DC | PRN
Start: 2016-01-31 — End: 2016-02-02

## 2016-01-31 MED ORDER — DIPHENHYDRAMINE HCL 12.5 MG/5ML PO ELIX: 25.0000 mg | ORAL_SOLUTION | ORAL | Status: DC | PRN

## 2016-01-31 SURGICAL SUPPLY — 84 items
ALCOHOL ISOPROPYL (RUBBING) (MISCELLANEOUS) ×2 IMPLANT
APL SKNCLS STERI-STRIP NONHPOA (GAUZE/BANDAGES/DRESSINGS) ×1
BANDAGE ELASTIC 6 VELCRO ST LF (GAUZE/BANDAGES/DRESSINGS) ×4 IMPLANT
BANDAGE ESMARK 6X9 LF (GAUZE/BANDAGES/DRESSINGS) ×1 IMPLANT
BENZOIN TINCTURE PRP APPL 2/3 (GAUZE/BANDAGES/DRESSINGS) ×2 IMPLANT
BLADE SAW SGTL 13.0X1.19X90.0M (BLADE) ×2 IMPLANT
BNDG CMPR 9X6 STRL LF SNTH (GAUZE/BANDAGES/DRESSINGS) ×1
BNDG CMPR MED 10X6 ELC LF (GAUZE/BANDAGES/DRESSINGS) ×1
BNDG ELASTIC 6X10 VLCR STRL LF (GAUZE/BANDAGES/DRESSINGS) ×1 IMPLANT
BNDG ESMARK 6X9 LF (GAUZE/BANDAGES/DRESSINGS) ×2
BONE CEMENT PALACOSE (Orthopedic Implant) ×4 IMPLANT
BOWL SMART MIX CTS (DISPOSABLE) ×2 IMPLANT
CAP KNEE TOTAL 3 ×1 IMPLANT
CEMENT BONE PALACOSE (Orthopedic Implant) ×2 IMPLANT
COVER SURGICAL LIGHT HANDLE (MISCELLANEOUS) ×2 IMPLANT
CUFF TOURNIQUET SINGLE 34IN LL (TOURNIQUET CUFF) ×2 IMPLANT
DRAPE EXTREMITY T 121X128X90 (DRAPE) ×2 IMPLANT
DRAPE IMP U-DRAPE 54X76 (DRAPES) ×2 IMPLANT
DRAPE INCISE IOBAN 66X45 STRL (DRAPES) ×2 IMPLANT
DRAPE ORTHO SPLIT 77X108 STRL (DRAPES) ×4
DRAPE PROXIMA HALF (DRAPES) ×2 IMPLANT
DRAPE SURG 17X11 SM STRL (DRAPES) ×4 IMPLANT
DRAPE SURG ORHT 6 SPLT 77X108 (DRAPES) ×2 IMPLANT
DRSG AQUACEL AG ADV 3.5X14 (GAUZE/BANDAGES/DRESSINGS) ×1 IMPLANT
DRSG PAD ABDOMINAL 8X10 ST (GAUZE/BANDAGES/DRESSINGS) ×1 IMPLANT
DURAPREP 26ML APPLICATOR (WOUND CARE) ×6 IMPLANT
ELECT CAUTERY BLADE 6.4 (BLADE) ×2 IMPLANT
ELECT REM PT RETURN 9FT ADLT (ELECTROSURGICAL) ×2
ELECTRODE REM PT RTRN 9FT ADLT (ELECTROSURGICAL) ×1 IMPLANT
EVACUATOR 1/8 PVC DRAIN (DRAIN) IMPLANT
FACESHIELD WRAPAROUND (MASK) IMPLANT
FACESHIELD WRAPAROUND OR TEAM (MASK) ×3 IMPLANT
GAUZE SPONGE 4X4 12PLY STRL (GAUZE/BANDAGES/DRESSINGS) ×2 IMPLANT
GAUZE XEROFORM 5X9 LF (GAUZE/BANDAGES/DRESSINGS) ×2 IMPLANT
GLOVE BIOGEL PI IND STRL 6.5 (GLOVE) IMPLANT
GLOVE BIOGEL PI IND STRL 7.0 (GLOVE) IMPLANT
GLOVE BIOGEL PI INDICATOR 6.5 (GLOVE) ×2
GLOVE BIOGEL PI INDICATOR 7.0 (GLOVE) ×1
GLOVE SURG SS PI 6.5 STRL IVOR (GLOVE) ×1 IMPLANT
GLOVE SURG SS PI 8.0 STRL IVOR (GLOVE) ×4 IMPLANT
GLOVE SURG SYN 7.5  E (GLOVE) ×4
GLOVE SURG SYN 7.5 E (GLOVE) ×4 IMPLANT
GLOVE SURG SYN 7.5 PF PI (GLOVE) ×2 IMPLANT
GOWN STRL REIN XL XLG (GOWN DISPOSABLE) ×4 IMPLANT
GOWN STRL REUS W/ TWL LRG LVL3 (GOWN DISPOSABLE) IMPLANT
GOWN STRL REUS W/TWL 2XL LVL3 (GOWN DISPOSABLE) ×1 IMPLANT
GOWN STRL REUS W/TWL LRG LVL3 (GOWN DISPOSABLE) ×2
HANDPIECE INTERPULSE COAX TIP (DISPOSABLE) ×2
HOOD PEEL AWAY FACE SHEILD DIS (HOOD) IMPLANT
HOOD PEEL AWAY FLYTE STAYCOOL (MISCELLANEOUS) ×2 IMPLANT
KIT BASIN OR (CUSTOM PROCEDURE TRAY) ×2 IMPLANT
KIT ROOM TURNOVER OR (KITS) ×2 IMPLANT
MANIFOLD NEPTUNE II (INSTRUMENTS) ×2 IMPLANT
MARKER SKIN DUAL TIP RULER LAB (MISCELLANEOUS) ×1 IMPLANT
NDL SPNL 18GX3.5 QUINCKE PK (NEEDLE) ×1 IMPLANT
NEEDLE SPNL 18GX3.5 QUINCKE PK (NEEDLE) ×2 IMPLANT
NS IRRIG 1000ML POUR BTL (IV SOLUTION) ×2 IMPLANT
PACK TOTAL JOINT (CUSTOM PROCEDURE TRAY) ×2 IMPLANT
PAD ABD 8X10 STRL (GAUZE/BANDAGES/DRESSINGS) ×1 IMPLANT
PAD ARMBOARD 7.5X6 YLW CONV (MISCELLANEOUS) ×4 IMPLANT
PADDING CAST COTTON 6X4 STRL (CAST SUPPLIES) ×2 IMPLANT
PEN SKIN MARKING BROAD (MISCELLANEOUS) ×2 IMPLANT
SAW OSC TIP CART 19.5X105X1.3 (SAW) ×2 IMPLANT
SEALER BIPOLAR AQUA 6.0 (INSTRUMENTS) ×1 IMPLANT
SET HNDPC FAN SPRY TIP SCT (DISPOSABLE) ×1 IMPLANT
SPONGE GAUZE 4X4 12PLY STER LF (GAUZE/BANDAGES/DRESSINGS) ×1 IMPLANT
STAPLER VISISTAT 35W (STAPLE) IMPLANT
SUCTION FRAZIER HANDLE 10FR (MISCELLANEOUS)
SUCTION FRAZIER TIP 10 FR DISP (SUCTIONS) ×1 IMPLANT
SUCTION TUBE FRAZIER 10FR DISP (MISCELLANEOUS) IMPLANT
SUT ETHILON 2 0 FS 18 (SUTURE) ×6 IMPLANT
SUT VIC AB 0 CT1 27 (SUTURE) ×4
SUT VIC AB 0 CT1 27XBRD ANBCTR (SUTURE) ×2 IMPLANT
SUT VIC AB 1 CT1 27 (SUTURE) ×4
SUT VIC AB 1 CT1 27XBRD ANBCTR (SUTURE) ×2 IMPLANT
SUT VIC AB 1 CTX 27 (SUTURE) ×2 IMPLANT
SUT VIC AB 2-0 CT1 27 (SUTURE) ×6
SUT VIC AB 2-0 CT1 TAPERPNT 27 (SUTURE) ×3 IMPLANT
SYR 50ML LL SCALE MARK (SYRINGE) ×2 IMPLANT
TOWEL OR 17X24 6PK STRL BLUE (TOWEL DISPOSABLE) ×2 IMPLANT
TOWEL OR 17X26 10 PK STRL BLUE (TOWEL DISPOSABLE) ×2 IMPLANT
TRAY FOLEY BAG SILVER LF 16FR (CATHETERS) ×1 IMPLANT
WRAP KNEE MAXI GEL POST OP (GAUZE/BANDAGES/DRESSINGS) ×2 IMPLANT
YANKAUER SUCT BULB TIP NO VENT (SUCTIONS) ×1 IMPLANT

## 2016-01-31 NOTE — Transfer of Care (Signed)
Immediate Anesthesia Transfer of Care Note  Patient: Casey Wilkinson  Procedure(s) Performed: Procedure(s): LEFT TOTAL KNEE ARTHROPLASTY (Left)  Patient Location: PACU  Anesthesia Type:General  Level of Consciousness: awake, alert  and oriented  Airway & Oxygen Therapy: Patient Spontanous Breathing and Patient connected to nasal cannula oxygen  Post-op Assessment: Report given to RN, Post -op Vital signs reviewed and stable and Patient moving all extremities X 4  Post vital signs: Reviewed and stable  Last Vitals:  Filed Vitals:   01/31/16 1034 01/31/16 1039  BP: 141/53 134/44  Pulse: 80 86  Temp:    Resp: 19 12    Complications: No apparent anesthesia complications

## 2016-01-31 NOTE — Anesthesia Procedure Notes (Addendum)
Anesthesia Regional Block:  Adductor canal block  Pre-Anesthetic Checklist: ,, timeout performed, Correct Patient, Correct Site, Correct Laterality, Correct Procedure, Correct Position, site marked, Risks and benefits discussed,  Surgical consent,  Pre-op evaluation,  At surgeon's request and post-op pain management  Laterality: Left  Prep: chloraprep       Needles:  Injection technique: Single-shot  Needle Type: Echogenic Needle     Needle Length: 9cm 9 cm Needle Gauge: 21 and 21 G    Additional Needles:  Procedures: ultrasound guided (picture in chart) Adductor canal block Narrative:  Start time: 01/31/2016 10:57 AM End time: 01/31/2016 11:02 AM Injection made incrementally with aspirations every 5 mL.  Performed by: Personally  Anesthesiologist: HODIERNE, ADAM  Additional Notes: Pt tolerated the procedure well.   Procedure Name: Intubation Date/Time: 01/31/2016 11:45 AM Performed by: Merrilyn Puma B Pre-anesthesia Checklist: Patient identified, Emergency Drugs available, Suction available, Patient being monitored and Timeout performed Patient Re-evaluated:Patient Re-evaluated prior to inductionOxygen Delivery Method: Circle system utilized Preoxygenation: Pre-oxygenation with 100% oxygen Intubation Type: IV induction Ventilation: Mask ventilation without difficulty Laryngoscope Size: Mac and 3 Grade View: Grade III Tube type: Oral Tube size: 7.0 mm Number of attempts: 1 Airway Equipment and Method: Stylet Placement Confirmation: CO2 detector,  ETT inserted through vocal cords under direct vision,  breath sounds checked- equal and bilateral and positive ETCO2 Secured at: 21 cm Tube secured with: Tape Dental Injury: Teeth and Oropharynx as per pre-operative assessment

## 2016-01-31 NOTE — H&P (Signed)
PREOPERATIVE H&P  Chief Complaint: left knee degenerative joint disease  HPI: Casey Wilkinson is a 75 y.o. female who presents for surgical treatment of left knee degenerative joint disease.  She denies any changes in medical history.  Past Medical History  Diagnosis Date  . Discoid lupus erythematosus   . Hypertension   . COPD (chronic obstructive pulmonary disease) ([place])   . Anemia   . Colitis April 2016  . Diverticulitis April 2016    and Colitis  . Arthritis   . Psoriasis   . Carotid artery stenosis     right side followed by [facility]- 65%  . Shortness of breath dyspnea     with exertion  . Cancer ([place])     colon.  Skin cancer - legs Squamous cell  . History of blood transfusion     4/23016, 06/2015- colectomy   Past Surgical History  Procedure Laterality Date  . Hemmroids  2006  . Recocele/entracele  2006  . Total vaginal hysterectomy  1968  . Colonoscopy  06/23/15  . Colonoscopy w/ polypectomy    . Laparoscopic partial colectomy N/A 06/23/2015    Procedure: LAPAROSCOPIC ILEOCECETOMY;  Surgeon: Excell Seltzer, MD;  Location: [facility];  Service: General;  Laterality: N/A;  . Eye surgery Bilateral     cataract with lens   Social History   Social History  . Marital Status: Married    Spouse Name: N/A  . Number of Children: 2  . Years of Education: N/A   Occupational History  . retired     Secretary/receptionist [phone]   Social History Main Topics  . Smoking status: Former Smoker -- 0.50 packs/day for 50 years    Types: Cigarettes    Quit date: 12/05/2000  . Smokeless tobacco: Never Used     Comment: started smoking in early 20s  . Alcohol Use: No  . Drug Use: No  . Sexual Activity: Not on file   Other Topics Concern  . Not on file   Social History Narrative   Family History  Problem Relation Age of Onset  . Heart disease Father     Before age 76 . Hyperlipidemia Father   . Hypertension Father   . Pneumonia Mother   . Alzheimer's disease Mother     Allergies  Allergen Reactions  . Prochlorperazine Edisylate Nausea Only and Other (See Comments)    **COMPAZINE**   Stroke-like symptoms  . Sulfamethoxazole Nausea Only and Other (See Comments)    Pt taking Methotrexate, Sulfur drugs could cause SEVERE fatal reaction.  . Doxycycline Hives   Prior to Admission medications   Medication Sig Start Date End Date Taking? Authorizing Provider  albuterol (PROVENTIL HFA;VENTOLIN HFA) 108 (90 BASE) MCG/ACT inhaler Inhale 2 puffs into the lungs every 6 (six) hours as needed for wheezing or shortness of breath.    Yes Historical Provider, MD  aspirin 81 MG tablet Take 1 tablet (81 mg total) by mouth daily. Resume in 1 week. Patient taking differently: Take 81 mg by mouth every morning.  02/19/15  Yes DEugenie Filler MD  atorvastatin (LIPITOR) 10 MG tablet Take 1 tablet by mouth every morning.  02/14/14  Yes Historical Provider, MD  Calcium Carbonate-Vitamin D 600-400 MG-UNIT per tablet Take 1 tablet by mouth every morning.    Yes Historical Provider, MD  docusate sodium (COLACE) 100 MG capsule Take 100 mg by mouth at bedtime.    Yes Historical Provider, MD  folic acid (FOLVITE) 1 MG  tablet Take 1 mg by mouth every morning.    Yes Historical Provider, MD  HYDROcodone-acetaminophen (NORCO/VICODIN) 5-325 MG tablet Take 1 tablet by mouth every 6 (six) hours as needed for moderate pain.   Yes Historical Provider, MD  Loratadine 5 MG/5ML SOLN Take 10 mLs by mouth at bedtime.   Yes Historical Provider, MD  methotrexate (RHEUMATREX) 2.5 MG tablet Take 15 mg by mouth once a week. Wednesdays   Yes Historical Provider, MD  Multiple Vitamins-Minerals (ABC PLUS SENIOR) TABS Take 1 tablet by mouth every morning.    Yes Historical Provider, MD  ondansetron (ZOFRAN ODT) 4 MG disintegrating tablet Take 1 tablet (4 mg total) by mouth every 8 (eight) hours as needed for nausea or vomiting. 02/12/15  Yes Eugenie Filler, MD  Potassium Gluconate 595 MG CAPS Take 1  capsule by mouth at bedtime.    Yes Historical Provider, MD  Tiotropium Bromide-Olodaterol (STIOLTO RESPIMAT) 2.5-2.5 MCG/ACT AERS Inhale 2 puffs into the lungs daily. 12/12/15  Yes Collene Gobble, MD  traMADol (ULTRAM) 50 MG tablet Take 50 mg by mouth every 6 (six) hours as needed for moderate pain.    Yes Historical Provider, MD  valsartan-hydrochlorothiazide (DIOVAN-HCT) 160-12.5 MG per tablet Take 1 tablet by mouth every morning.  02/11/15  Yes Historical Provider, MD  vitamin B-12 (CYANOCOBALAMIN) 500 MCG tablet Take 2,500 mcg by mouth every morning.    Yes Historical Provider, MD  polyethylene glycol (MIRALAX / GLYCOLAX) packet Take 17 g by mouth daily as needed.    Historical Provider, MD     Positive ROS: All other systems have been reviewed and were otherwise negative with the exception of those mentioned in the HPI and as above.  Physical Exam: General: Alert, no acute distress Cardiovascular: No pedal edema Respiratory: No cyanosis, no use of accessory musculature GI: abdomen soft Skin: No lesions in the area of chief complaint Neurologic: Sensation intact distally Psychiatric: Patient is competent for consent with normal mood and affect Lymphatic: no lymphedema  MUSCULOSKELETAL: exam stable  Assessment: left knee degenerative joint disease  Plan: Plan for Procedure(s): LEFT TOTAL KNEE ARTHROPLASTY  The risks benefits and alternatives were discussed with the patient including but not limited to the risks of nonoperative treatment, versus surgical intervention including infection, bleeding, nerve injury,  blood clots, cardiopulmonary complications, morbidity, mortality, among others, and they were willing to proceed.   Marianna Payment, MD   01/31/2016 7:46 AM

## 2016-01-31 NOTE — Discharge Instructions (Signed)

## 2016-01-31 NOTE — Anesthesia Preprocedure Evaluation (Addendum)
Anesthesia Evaluation  Patient identified by MRN, date of birth, ID band Patient awake    Reviewed: Allergy & Precautions, NPO status , Patient's Chart, lab work & pertinent test results  Airway Mallampati: II   Neck ROM: full    Dental   Pulmonary shortness of breath, COPD, former smoker,    breath sounds clear to auscultation       Cardiovascular hypertension, + Peripheral Vascular Disease   Rhythm:regular Rate:Normal     Neuro/Psych    GI/Hepatic   Endo/Other    Renal/GU      Musculoskeletal  (+) Arthritis ,   Abdominal   Peds  Hematology  (+) anemia ,   Anesthesia Other Findings   Reproductive/Obstetrics                            Anesthesia Physical Anesthesia Plan  ASA: II  Anesthesia Plan: General and Regional   Post-op Pain Management:    Induction: Intravenous  Airway Management Planned: Oral ETT  Additional Equipment:   Intra-op Plan:   Post-operative Plan: Extubation in OR  Informed Consent: I have reviewed the patients History and Physical, chart, labs and discussed the procedure including the risks, benefits and alternatives for the proposed anesthesia with the patient or authorized representative who has indicated his/her understanding and acceptance.     Plan Discussed with: CRNA, Anesthesiologist and Surgeon  Anesthesia Plan Comments:        Anesthesia Quick Evaluation

## 2016-01-31 NOTE — Progress Notes (Signed)
Patient arrived from PACU to 5N21 at this time. VSS. Safety precautions and order reviewed with patient. ICS encourage. Call light within reach. NO other distress noted. Will continue to monitor.  Ave Filter, RN

## 2016-01-31 NOTE — Progress Notes (Signed)
Utilization review completed.  

## 2016-01-31 NOTE — Op Note (Signed)
Total Knee Arthroplasty Procedure Note Casey Wilkinson [id number] 01/31/2016   Preoperative diagnosis: Left knee osteoarthritis  Postoperative diagnosis:same  Operative procedure: Left total knee arthroplasty. CPT [id number]  Surgeon: N. Eduard Roux, MD  Assistants: Ky Barban, RNFA  Anesthesia: general, regional  Tourniquet time: less than 90 minutes  Implants used: [employer] Journey II BCS Femur: Size 5, PS Tibia:Size 3 Patella: 29 mm, 7.5 thick Polyethylene: 9 mm high flexion  Indication: Casey Wilkinson is a 75 y.o. year old female with a history of knee pain. Having failed conservative management, the patient elected to proceed with a total knee arthroplasty.  We have reviewed the risk and benefits of the surgery and they elected to proceed after voicing understanding.  Procedure:  After informed consent was obtained and understanding of the risk were voiced including but not limited to bleeding, infection, damage to surrounding structures including nerves and vessels, blood clots, leg length inequality and the failure to achieve desired results, the operative extremity was marked with verbal confirmation of the patient in the holding area.   The patient was then brought to the operating room and transported to the operating room table in the supine position.  A tourniquet was applied to the operative extremity around the upper thigh. The operative limb was then prepped and draped in the usual sterile fashion and preoperative antibiotics were administered.  A time out was performed prior to the start of surgery confirming the correct extremity, preoperative antibiotic administration, as well as team members, implants and instruments available for the case. Correct surgical site was also confirmed with preoperative radiographs. The limb was then elevated for exsanguination and the tourniquet was inflated. A midline incision was made and a standard medial parapatellar  approach was performed.  The patella was prepared and sized to a 29 mm.  A cover was placed on the patella for protection from retractors.  We then turned our attention to the femur. Posterior cruciate ligament was sacrificed. Start site was drilled in the femur and the intramedullary distal femoral cutting guide was placed, set at 5 degrees valgus, taking 9 mm of distal resection. The distal cut was made. Osteophytes were then removed. Next, the proximal tibial cutting guide was placed with appropriate slope, varus/valgus alignment and depth of resection. The proximal tibial cut was made. Gap blocks were then used to assess the extension gap and alignment, and appropriate soft tissue releases were performed. Attention was turned back to the femur, which was sized using the sizing guide to a size 5. Appropriate rotation of the femoral component was determined using epicondylar axis, Whiteside's line, and assessing the flexion gap under ligament tension. The appropriate size 4-in-1 cutting block was placed and cuts were made. Posterior femoral osteophytes and uncapped bone were then removed with the curved osteotome. The tibia was sized for a size 3 component. The femoral box-cutting guide was placed and prepared for a PS femoral component. Trial components were placed, and stability was checked in full extension, mid-flexion, and deep flexion. Proper tibial rotation was determined and marked.  The patella tracked well without a lateral release. Trial components were then removed and tibial preparation performed. A posterior capsular injection comprising of 20 cc of 1.3% exparel and 40 cc of normal saline was performed for postoperative pain control. The bony surfaces were irrigated with a pulse lavage and then dried. Bone cement was vacuum mixed on the back table, and the final components sized above were cemented into place. After  cement had finished curing, excess cement was removed. The stability of the  construct was re-evaluated throughout a range of motion and found to be acceptable. The trial liner was removed, the knee was copiously irrigated, and the knee was re-evaluated for any excess bone debris. The real polyethylene liner, 9 mm thick, was inserted and checked to ensure the locking mechanism had engaged appropriately. The tourniquet was deflated and hemostasis was achieved. The wound was irrigated with dilute betadine in normal saline, and then again with normal saline. A drain was not placed. Capsular closure was performed with a #1 vicryl, subcutaneous fat closed with a 0 vicryl suture, then subcutaneous tissue closed with interrupted 2.0 vicryl suture. The skin was then closed with a 2.0 nylon. A sterile dressing was applied.   The patient was awakened in the operating room and taken to recovery in stable condition. All sponge, needle, and instrument counts were correct at the end of the case.  Position: supine  Complications: none.  Time Out: performed   Drains/Packing: none  Estimated blood loss: 50 cc  Returned to Recovery Room: in good condition.   Antibiotics: yes   Mechanical VTE (DVT) Prophylaxis: sequential compression devices, TED thigh-high  Chemical VTE (DVT) Prophylaxis: aspirin  Fluid Replacement  Crystalloid: see anesthesia record Blood: none  FFP: none   Specimens Removed: 1 to pathology   Sponge and Instrument Count Correct? yes   PACU: portable radiograph - knee AP and Lateral   Admission: inpatient status, start PT & OT POD#1  Plan/RTC: Return in 2 weeks for wound check.   Weight Bearing/Load Lower Extremity: full   N. Eduard Roux, MD Onset 2:04 PM

## 2016-02-01 ENCOUNTER — Encounter (HOSPITAL_COMMUNITY): Payer: Self-pay | Admitting: Orthopaedic Surgery

## 2016-02-01 LAB — CBC
HEMATOCRIT: 23.8 % — AB (ref 36.0–46.0)
HEMOGLOBIN: 8.1 g/dL — AB (ref 12.0–15.0)
MCH: 31.5 pg (ref 26.0–34.0)
MCHC: 34 g/dL (ref 30.0–36.0)
MCV: 92.6 fL (ref 78.0–100.0)
Platelets: 104 10*3/uL — ABNORMAL LOW (ref 150–400)
RBC: 2.57 MIL/uL — AB (ref 3.87–5.11)
RDW: 15.5 % (ref 11.5–15.5)
WBC: 7.5 10*3/uL (ref 4.0–10.5)

## 2016-02-01 LAB — BASIC METABOLIC PANEL
Anion gap: 7 (ref 5–15)
BUN: 10 mg/dL (ref 6–20)
CHLORIDE: 104 mmol/L (ref 101–111)
CO2: 25 mmol/L (ref 22–32)
CREATININE: 0.73 mg/dL (ref 0.44–1.00)
Calcium: 8.2 mg/dL — ABNORMAL LOW (ref 8.9–10.3)
GFR calc Af Amer: >60 mL/min (ref >60)
GFR calc non Af Amer: >60 mL/min (ref >60)
GLUCOSE: 112 mg/dL — AB (ref 65–99)
POTASSIUM: 3.7 mmol/L (ref 3.5–5.1)
Sodium: 136 mmol/L (ref 135–145)

## 2016-02-01 NOTE — Anesthesia Postprocedure Evaluation (Signed)
Anesthesia Post Note  Patient: Casey Wilkinson  Procedure(s) Performed: Procedure(s) (LRB): LEFT TOTAL KNEE ARTHROPLASTY (Left)  Patient location during evaluation: PACU Anesthesia Type: General Level of consciousness: awake and alert and patient cooperative Pain management: pain level controlled Vital Signs Assessment: post-procedure vital signs reviewed and stable Respiratory status: spontaneous breathing and respiratory function stable Cardiovascular status: stable Anesthetic complications: no    Last Vitals:  Filed Vitals:   02/01/16 0540 02/01/16 0812  BP: 138/66   Pulse: 82 91  Temp: 36.8 C   Resp: 15     Last Pain:  Filed Vitals:   02/01/16 1016  PainSc: [place]

## 2016-02-01 NOTE — Evaluation (Signed)
Physical Therapy Evaluation Patient Details Name: Casey Wilkinson MRN: [id number] DOB: [birth year] Today's Date: 02/01/2016   History of Present Illness  75 yo admitted for L TKA. PMHx: colon cA, skin CA, OA, HTN, COPD  Clinical Impression  Pt pleasant, moving well but limited by strength, activity tolerance and decreased cardiopulmonary tolerance. Pt on 2L on arrival with sats 94% and on RA dropped to 85% with 2L reapplied. Walking from bath to chair pt dropped to 79% on 2L with O2 bumped to 4L with cues for pursed lip breathing for pt to recover to 91% and able to maintain on 2.5L. Pt with decreased strength, ROM, transfers, gait and functional mobility who will benefit from acute therapy to maximize indepedence, gait, strength and function to return pt to mod I level.     Follow Up Recommendations Home health PT;Supervision for mobility/OOB    Equipment Recommendations  None recommended by PT    Recommendations for Other Services OT consult     Precautions / Restrictions Precautions Precautions: Fall;Knee Restrictions LLE Weight Bearing: Weight bearing as tolerated      Mobility  Bed Mobility Overal bed mobility: Needs Assistance Bed Mobility: Supine to Sit     Supine to sit: Min guard     General bed mobility comments: guarding for LLE, cues for sequence, HOB 25degrees and use of rail to raise trunk   Transfers Overall transfer level: Needs assistance   Transfers: Sit to/from Stand Sit to Stand: Min guard         General transfer comment: cues for sequence, hand and LLE position  Ambulation/Gait Ambulation/Gait assistance: Min guard Ambulation Distance (Feet): 80 Feet Assistive device: Rolling walker (2 wheeled) Gait Pattern/deviations: Step-through pattern;Decreased stride length;Trunk flexed   Gait velocity interpretation: Below normal speed for age/gender General Gait Details: cues for posture, position in RW, increased stride and step through pattern. 54'  then 15' after toileting  Stairs            Wheelchair Mobility    Modified Rankin (Stroke Patients Only)       Balance                                             Pertinent Vitals/Pain Pain Assessment: No/denies pain    Home Living Family/patient expects to be discharged to:: Private residence Living Arrangements: Spouse/significant other Available Help at Discharge: Family;Available 24 hours/day Type of Home: House Home Access: Stairs to enter Entrance Stairs-Rails: None Entrance Stairs-Number of Steps: 2 Home Layout: One level Home Equipment: Walker - 2 wheels;Cane - single point;Shower seat;Bedside commode      Prior Function Level of Independence: Needs assistance   Gait / Transfers Assistance Needed: pt was walking with RW and performing all mobility on her own  ADL's / Homemaking Assistance Needed: husband does all homemaking        Hand Dominance        Extremity/Trunk Assessment   Upper Extremity Assessment: Overall WFL for tasks assessed           Lower Extremity Assessment: LLE deficits/detail   LLE Deficits / Details: decreased strength and ROm as expected post op  Cervical / Trunk Assessment: Normal  Communication   Communication: No difficulties  Cognition Arousal/Alertness: Awake/alert Behavior During Therapy: WFL for tasks assessed/performed Overall Cognitive Status: Within Functional Limits for tasks assessed  General Comments      Exercises Total Joint Exercises Quad Sets: AROM;Left;5 reps;Supine Heel Slides: AROM;Left;5 reps;Supine Straight Leg Raises: AAROM;Left;5 reps;Supine      Assessment/Plan    PT Assessment Patient needs continued PT services  PT Diagnosis Difficulty walking;Acute pain   PT Problem List Decreased strength;Decreased range of motion;Decreased activity tolerance;Decreased mobility;Pain;Decreased knowledge of use of DME  PT Treatment Interventions  DME instruction;Gait training;Stair training;Functional mobility training;Therapeutic activities;Therapeutic exercise;Patient/family education   PT Goals (Current goals can be found in the Care Plan section) Acute Rehab PT Goals Patient Stated Goal: be able to walk  PT Goal Formulation: With patient Time For Goal Achievement: 02/08/16 Potential to Achieve Goals: Good    Frequency 7X/week   Barriers to discharge        Co-evaluation               End of Session Equipment Utilized During Treatment: Gait belt;Oxygen Activity Tolerance: Patient tolerated treatment well Patient left: in chair;with call bell/phone within reach Nurse Communication: Mobility status         Time: 0932-6712 PT Time Calculation (min) (ACUTE ONLY): 50 min   Charges:   PT Evaluation $PT Eval Moderate Complexity: 1 Procedure PT Treatments $Gait Training: 8-22 mins $Therapeutic Activity: 8-22 mins   PT G Codes:        Melford Aase 02/01/2016, 8:41 AM Elwyn Reach, [place]

## 2016-02-01 NOTE — Care Management Note (Signed)
Case Management Note  Patient Details  Name: Casey Wilkinson MRN: [id number] Date of Birth: [birth year]  Subjective/Objective:             S/p left total knee arthroplasty       Action/Plan: Spoke with patient and her husband about discharge plan. Gave choice and they selected [facility]. Contacted [facility] at [facility] and set up [place]. Patient stated that she has a rolling walker and 3N1 at home. Her husband will be assisting her after discharge. No other discharge needs identified.  Expected Discharge Date:                  Expected Discharge Plan:  [place]  In-House Referral:  NA  Discharge planning Services  CM Consult  Post Acute Care Choice:  Home Health Choice offered to:  Patient  DME Arranged:  N/A DME Agency:     HH Arranged:  PT [place]:  [place]  Status of Service:  In process, will continue to follow  Medicare Important Message Given:    Date Medicare IM Given:    Medicare IM give by:    Date Additional Medicare IM Given:    Additional Medicare Important Message give by:     If discussed at [place] of Stay Meetings, dates discussed:    Additional Comments:  [name], RN 02/01/2016, 2:21 PM

## 2016-02-01 NOTE — Evaluation (Signed)
Occupational Therapy Evaluation Patient Details Name: Casey Wilkinson MRN: [id number] DOB: 1941-09-11 Today's Date: 02/01/2016    History of Present Illness 75 yo admitted for L TKA. PMHx: colon cA, skin CA, OA, HTN, COPD   Clinical Impression   Patient presenting with decreased ADL and functional mobility independence secondary to above. Patient indepednent PTA. Patient currently functioning at an overall min assist level. Patient will benefit from acute OT to increase overall independence in the areas of ADLs, functional mobility, and overall safety in order to safely discharge home with assistance from husband prn.   Patient on 2L/min supplemental 02 at rest and sats=93%. Doffed supplemental 02 and pt ambulated to BR, sats dropped to 70% on RA. Donned 2L/min supplemental 02 and sats slowly increased back to 90% with pursed lip breathing and seated rest break. Pt ambulated back to recliner on 2L/min supplemental 02 and sats decreased to 83%, then stayed between 85%-93% at rest and on 2L/min supplemental 02.     Follow Up Recommendations  No OT follow up;Supervision - Intermittent    Equipment Recommendations  None recommended by OT    Recommendations for Other Services  None at this time   Precautions / Restrictions Precautions Precautions: Fall;Knee Precaution Comments: watch sats  Restrictions Weight Bearing Restrictions: Yes LLE Weight Bearing: Weight bearing as tolerated    Mobility Bed Mobility Pt found seated in recliner upon OT entering/exiting room  Transfers Overall transfer level: Needs assistance   Transfers: Sit to/from Stand Sit to Stand: Min guard General transfer comment: cues for sequence, hand and LLE position    Balance Overall balance assessment: Needs assistance Sitting-balance support: No upper extremity supported;Feet supported Sitting balance-Leahy Scale: Good     Standing balance support: During functional activity;No upper extremity  supported Standing balance-Leahy Scale: Fair Standing balance comment: Pt able to stand at sink for grooming task with no UE support. AND pt able to perform clothing management in standing prior to and post toileting needs.     ADL Overall ADL's : Needs assistance/impaired Eating/Feeding: Set up;Sitting   Grooming: Set up;Supervision/safety;Standing   Upper Body Bathing: Set up;Sitting   Lower Body Bathing: Minimal assistance;Sit to/from stand   Upper Body Dressing : Set up;Sitting   Lower Body Dressing: Minimal assistance;Sit to/from stand   Toilet Transfer: Min guard;RW;BSC;Ambulation   Toileting- Water quality scientist and Hygiene: Sit to/from stand;Min Child psychotherapist Details (indicate cue type and reason): did not occur Functional mobility during ADLs: Min guard;Minimal assistance;Cueing for safety;Cueing for sequencing;Rolling walker General ADL Comments: Pt barely able to reach BLEs for LB ADLs. Patient's husband has AE from previous surgeries. Husband can also assist with LB ADLs prn.     Vision Vision Assessment?: No apparent visual deficits          Pertinent Vitals/Pain Pain Assessment: 0-10 Pain Score: 8  (During functional mobility ) Pain Location: RLE/knee Pain Descriptors / Indicators: Aching;Sore Pain Intervention(s): Limited activity within patient's tolerance;Monitored during session;Repositioned     Hand Dominance Right   Extremity/Trunk Assessment Upper Extremity Assessment Upper Extremity Assessment: Overall WFL for tasks assessed   Lower Extremity Assessment Lower Extremity Assessment: Defer to PT evaluation   Cervical / Trunk Assessment Cervical / Trunk Assessment: Normal   Communication Communication Communication: No difficulties   Cognition Arousal/Alertness: Awake/alert Behavior During Therapy: WFL for tasks assessed/performed Overall Cognitive Status: Within Functional Limits for tasks assessed              Home  Living Family/patient expects to be discharged to:: Private residence Living Arrangements: Spouse/significant other Available Help at Discharge: Family;Available 24 hours/day Type of Home: House Home Access: Stairs to enter [employer] of Steps: 2 Entrance Stairs-Rails: None Home Layout: One level     Bathroom Shower/Tub: Occupational psychologist: Handicapped height     Home Equipment: Environmental consultant - 2 wheels;Cane - single point;Shower seat;Bedside commode   Prior Functioning/Environment Level of Independence: Needs assistance  Gait / Transfers Assistance Needed: pt was walking with RW and performing all mobility on her own ADL's / Homemaking Assistance Needed: husband does all homemaking    OT Diagnosis: Generalized weakness   OT Problem List: Decreased strength;Decreased range of motion;Decreased activity tolerance;Impaired balance (sitting and/or standing);Decreased safety awareness;Decreased knowledge of use of DME or AE;Decreased knowledge of precautions;Pain   OT Treatment/Interventions: Self-care/ADL training;Therapeutic exercise;Energy conservation;DME and/or AE instruction    OT Goals(Current goals can be found in the care plan section) Acute Rehab OT Goals Patient Stated Goal: go home tomorrow  OT Goal Formulation: With patient Time For Goal Achievement: 02/08/16 Potential to Achieve Goals: Good ADL Goals Pt Will Perform Grooming: with modified independence;standing Pt Will Perform Lower Body Bathing: with modified independence;sit to/from stand (using AE prn) Pt Will Perform Lower Body Dressing: with modified independence;sit to/from stand (using AE prn) Pt Will Transfer to Toilet: with modified independence;ambulating;bedside commode Pt Will Perform Tub/Shower Transfer: Shower transfer;ambulating;rolling walker;shower seat;with modified independence Additional ADL Goal #1: Pt will be independent with verbalizing at least 3 energy conservation techniques  during ADL and IADL   OT Frequency: Min 2X/week   Barriers to D/C: none known at this time   End of Session Equipment Utilized During Treatment: Gait belt;Rolling walker  Activity Tolerance: Patient tolerated treatment well Patient left: in chair;with call bell/phone within reach;with family/visitor present   Time: 4481-8563 OT Time Calculation (min): 27 min Charges:  OT General Charges $OT Visit: 1 Procedure OT Evaluation $OT Eval Low Complexity: 1 Procedure OT Treatments $Self Care/Home Management : 8-22 mins  Chrys Racer , MS, OTR/L, CLT Pager: [phone]  02/01/2016, 12:25 PM

## 2016-02-01 NOTE — Progress Notes (Signed)
Physical Therapy Treatment Patient Details Name: Casey Wilkinson MRN: [id number] DOB: 01-21-1941 Today's Date: 02/01/2016    History of Present Illness 75 yo admitted for L TKA. PMHx: colon cA, skin CA, OA, HTN, COPD    PT Comments    Pt pleasant upon arrival. Pt improved from AM session, but has deficits in strength, and activity tolerance, and cardiopulmonary tolerance. O2 sats 94% on 2L at rest upon arrival. O2 sats dropped to 83% on 2L when patient performed HEP, and so increased to 3L. O2 dropped to 83% on 3L after ambulating 40 feet, and so increased to 4L. O2 ranged from 85-93% on 4L throughout ambulation. O2 sats 87-90% on 2L at end of session with pt in chair. Will continue to follow.    Follow Up Recommendations  Home health PT;Supervision for mobility/OOB     Equipment Recommendations       Recommendations for Other Services       Precautions / Restrictions Precautions Precautions: Fall;Knee Precaution Comments: watch sats  Restrictions Weight Bearing Restrictions: Yes LLE Weight Bearing: Weight bearing as tolerated    Mobility  Bed Mobility               General bed mobility comments: patient in chair on arrival  Transfers Overall transfer level: Needs assistance Equipment used: None Transfers: Sit to/from Stand Sit to Stand: Min guard         General transfer comment: min guard for safety. cues for LLE position  Ambulation/Gait Ambulation/Gait assistance: Min guard Ambulation Distance (Feet): 170 Feet Assistive device: Rolling walker (2 wheeled) Gait Pattern/deviations: Step-to pattern;Step-through pattern   Gait velocity interpretation: Below normal speed for age/gender General Gait Details: intially step-to pattern, but patient progressed to step through pattern with cues. cues for posture and position in walker.    Stairs            Wheelchair Mobility    Modified Rankin (Stroke Patients Only)       Balance Overall balance  assessment: Needs assistance Sitting-balance support: No upper extremity supported;Feet supported Sitting balance-Leahy Scale: Good     Standing balance support: During functional activity;No upper extremity supported Standing balance-Leahy Scale: Fair Standing balance comment: Pt able to stand at sink for grooming task with no UE support. AND pt able to perform clothing management in standing prior to and post toileting needs.                     Cognition Arousal/Alertness: Awake/alert Behavior During Therapy: WFL for tasks assessed/performed Overall Cognitive Status: Within Functional Limits for tasks assessed                      Exercises Total Joint Exercises Heel Slides: AAROM;Left;10 reps;Seated Straight Leg Raises: AAROM;Left;10 reps;Seated Long Arc Quad: AROM;Left;10 reps;Seated Goniometric ROM: 4-66    General Comments        Pertinent Vitals/Pain Pain Assessment: 0-10 Pain Score: 5  Pain Location: right knee Pain Descriptors / Indicators: Aching Pain Intervention(s): Limited activity within patient's tolerance;Monitored during session    [place] expects to be discharged to:: Private residence Living Arrangements: Spouse/significant other Available Help at Discharge: Family;Available 24 hours/day Type of Home: House Home Access: Stairs to enter Entrance Stairs-Rails: None Home Layout: One level Home Equipment: Environmental consultant - 2 wheels;Cane - single point;Shower seat;Bedside commode      Prior Function Level of Independence: Needs assistance  Gait / Transfers Assistance Needed: pt was walking with RW  and performing all mobility on her own ADL's / Homemaking Assistance Needed: husband does all homemaking     PT Goals (current goals can now be found in the care plan section) Acute Rehab PT Goals Patient Stated Goal: go home tomorrow  Progress towards PT goals: Progressing toward goals    Frequency       PT Plan Current plan  remains appropriate    Co-evaluation             End of Session Equipment Utilized During Treatment: Gait belt;Oxygen Activity Tolerance: Patient tolerated treatment well Patient left: in chair;with family/visitor present;with call bell/phone within reach     Time: 1317-1350 PT Time Calculation (min) (ACUTE ONLY): 33 min  Charges:  $Gait Training: 8-22 mins $Therapeutic Exercise: 8-22 mins                    G Codes:      Haze Justin Feb 23, 2016, 2:42 PM   Haze Justin, [place] [phone]

## 2016-02-01 NOTE — Progress Notes (Signed)
   Subjective:  Patient reports pain as moderate.  No events.  Objective:   VITALS:   Filed Vitals:   01/31/16 1552 01/31/16 1945 02/01/16 0010 02/01/16 0540  BP: 154/72 155/75 145/55 138/66  Pulse: 92 92 89 82  Temp: 98.1 F (36.7 C) 97.9 F (36.6 C) 97.5 F (36.4 C) 98.2 F (36.8 C)  TempSrc: Oral     Resp: '15 15 14 15  '$ SpO2: 98% 98% 98% 98%    Neurologically intact Neurovascular intact Sensation intact distally Intact pulses distally Dorsiflexion/Plantar flexion intact Incision: dressing C/D/I and no drainage No cellulitis present Compartment soft   Lab Results  Component Value Date   WBC 7.5 02/01/2016   HGB 8.1* 02/01/2016   HCT 23.8* 02/01/2016   MCV 92.6 02/01/2016   PLT 104* 02/01/2016     Assessment/Plan:  1 Day Post-Op   - Expected postop acute blood loss anemia - will monitor for symptoms - Up with PT/OT - DVT ppx - SCDs, ambulation, aspirin - WBAT operative extremity - Pain control - Discharge planning  Marianna Payment 02/01/2016, 7:51 AM [phone]

## 2016-02-02 ENCOUNTER — Encounter (HOSPITAL_COMMUNITY): Payer: Self-pay | Admitting: General Practice

## 2016-02-02 LAB — PREPARE RBC (CROSSMATCH)

## 2016-02-02 LAB — CBC
HCT: 23.9 % — ABNORMAL LOW (ref 36.0–46.0)
HEMOGLOBIN: 7.7 g/dL — AB (ref 12.0–15.0)
MCH: 30 pg (ref 26.0–34.0)
MCHC: 32.2 g/dL (ref 30.0–36.0)
MCV: 93 fL (ref 78.0–100.0)
Platelets: 104 10*3/uL — ABNORMAL LOW (ref 150–400)
RBC: 2.57 MIL/uL — ABNORMAL LOW (ref 3.87–5.11)
RDW: 15.5 % (ref 11.5–15.5)
WBC: 8.6 10*3/uL (ref 4.0–10.5)

## 2016-02-02 LAB — HEMOGLOBIN AND HEMATOCRIT, BLOOD
HEMATOCRIT: 31.8 % — AB (ref 36.0–46.0)
HEMOGLOBIN: 10.3 g/dL — AB (ref 12.0–15.0)

## 2016-02-02 MED ORDER — SODIUM CHLORIDE 0.9 % IV SOLN: Freq: Once | INTRAVENOUS | Status: DC

## 2016-02-02 NOTE — Care Management Note (Signed)
Case Management Note  Patient Details  Name: Casey Wilkinson MRN: [id number] Date of Birth: [birth year]  Subjective/Objective:       S/p left total knee arthroplasty             Action/Plan:  02/02/16 Patient to discharge with home O2. Contacted James with [facility], requested O2. Contacted Tiffany at [facility] and added HHRN due to new home O2. Updated patient regarding home O2 and HHRN.    Spoke with patient and her husband about discharge plan. Gave choice and they selected [facility]. Contacted Tiffany at [facility] and set up [place]. Patient stated that she has a rolling walker and 3N1 at home. Her husband will be assisting her after discharge. No other discharge needs identified.    Expected Discharge Date:                  Expected Discharge Plan:  [place]  In-House Referral:  NA  Discharge planning Services  CM Consult  Post Acute Care Choice:  Home Health, Durable Medical Equipment Choice offered to:  Patient  DME Arranged:  Oxygen DME Agency:  [place]:  PT, RN [facility] Agency:  [place]  Status of Service:  Completed, signed off  Medicare Important Message Given:    Date Medicare IM Given:    Medicare IM give by:    Date Additional Medicare IM Given:    Additional Medicare Important Message give by:     If discussed at [place] of Stay Meetings, dates discussed:    Additional Comments:  [name], RN 02/02/2016, 10:17 AM

## 2016-02-02 NOTE — Care Management Important Message (Signed)
Important Message  Patient Details  Name: Casey Wilkinson MRN: [id number] Date of Birth: September 29, 1941   Medicare Important Message Given:  Yes    Nathen May 02/02/2016, 11:29 AM

## 2016-02-02 NOTE — Progress Notes (Signed)
Physical Therapy Progress Note Pt with continued increase in gait distance and performs mobility and transfers well. Continues to require supplemental O2. Will follow but pt safe for D/C home.   02/02/16 1202  PT Visit Information  Last PT Received On 02/02/16  Assistance Needed +1  History of Present Illness 75 yo admitted for L TKA. PMHx: colon cA, skin CA, OA, HTN, COPD  PT Time Calculation  PT Start Time (ACUTE ONLY) 1153  PT Stop Time (ACUTE ONLY) 1218  PT Time Calculation (min) (ACUTE ONLY) 25 min  Precautions  Precautions Fall;Knee  Precaution Comments watch sats   Restrictions  LLE Weight Bearing WBAT  Pain Assessment  Pain Assessment 0-10  Pain Score 5  Pain Location left hip  Pain Descriptors / Indicators Sore  Pain Intervention(s) Repositioned;Premedicated before session;Monitored during session;Limited activity within patient's tolerance  Cognition  Arousal/Alertness Awake/alert  Behavior During Therapy WFL for tasks assessed/performed  Overall Cognitive Status Within Functional Limits for tasks assessed  Bed Mobility  General bed mobility comments patient in chair on arrival  Transfers  Overall transfer level Needs assistance  Transfers Sit to/from Stand  Sit to Stand Supervision  General transfer comment supervision for safety  Ambulation/Gait  Ambulation/Gait assistance Min guard  Ambulation Distance (Feet) 350 Feet  Assistive device Rolling walker (2 wheeled)  General Gait Details cues for posture, pursed lip breathing, increased dorsiflexion  Gait velocity interpretation Below normal speed for age/gender  Total Joint Exercises  Heel Slides Left;Seated;AROM;15 reps  Hip ABduction/ADduction AROM;Seated;Left;15 reps  Straight Leg Raises Left;Seated;AROM;15 reps  Long Arc Quad AROM;Seated;Left;15 reps  PT - End of Session  Equipment Utilized During Treatment Gait belt;Oxygen  Activity Tolerance Patient tolerated treatment well  Patient left with call  bell/phone within reach;in chair  Nurse Communication Mobility status  PT - Assessment/Plan  PT Plan Current plan remains appropriate  Follow Up Recommendations Home health PT;Supervision for mobility/OOB  PT Goal Progression  Progress towards PT goals Progressing toward goals  PT General Charges  $$ ACUTE PT VISIT 1 Procedure  PT Treatments  $Gait Training 8-22 mins  $Therapeutic Exercise 8-22 mins  Elwyn Reach, [place]

## 2016-02-02 NOTE — Care Management Important Message (Signed)
Important Message  Patient Details  Name: Casey Wilkinson MRN: [id number] Date of Birth: [birth year]   Medicare Important Message Given:  Yes    Nathen May 02/02/2016, 11:28 AM

## 2016-02-02 NOTE — Discharge Summary (Signed)
Physician Discharge Summary      Patient ID: Casey Wilkinson MRN: [id number] DOB/AGE: 1941/04/20 75 y.o.  Admit date: 01/31/2016 Discharge date: 02/02/2016  Admission Diagnoses:  <principal problem not specified>  Discharge Diagnoses:  Active Problems:   Degenerative arthritis of left knee   Past Medical History  Diagnosis Date  . Discoid lupus erythematosus   . Hypertension   . COPD (chronic obstructive pulmonary disease) ([place])   . Anemia   . Colitis April 2016  . Diverticulitis April 2016    and Colitis  . Arthritis   . Psoriasis   . Carotid artery stenosis     right side followed by [facility]- 65%  . Shortness of breath dyspnea     with exertion  . Cancer ([place])     colon.  Skin cancer - legs Squamous cell  . History of blood transfusion     4/23016, 06/2015- colectomy    Surgeries: Procedure(s): LEFT TOTAL KNEE ARTHROPLASTY on 01/31/2016   Consultants (if any):    Discharged Condition: Improved  Hospital Course: Casey Wilkinson is an 75 y.o. female who was admitted 01/31/2016 with a diagnosis of <principal problem not specified> and went to the operating room on 01/31/2016 and underwent the above named procedures.    She was given perioperative antibiotics:  Anti-infectives    Start     Dose/Rate Route Frequency Ordered Stop   01/31/16 1800  ceFAZolin (ANCEF) IVPB 2g/100 mL premix     2 g 200 mL/hr over 30 Minutes Intravenous Every 6 hours 01/31/16 1547 02/01/16 0122   01/31/16 1230  ceFAZolin (ANCEF) IVPB 2g/100 mL premix     2 g 200 mL/hr over 30 Minutes Intravenous To [facility] 01/30/16 1104 01/31/16 1150    .  She was given sequential compression devices, early ambulation, and aspirin for DVT prophylaxis.  She benefited maximally from the hospital stay and there were no complications.    Recent vital signs:  Filed Vitals:   02/01/16 1950 02/02/16 0500  BP: 131/60 138/56  Pulse: 88 83  Temp: 98 F (36.7 C) 98.1 F (36.7 C)  Resp: 18 18     Recent laboratory studies:  Lab Results  Component Value Date   HGB 7.7* 02/02/2016   HGB 8.1* 02/01/2016   HGB 10.6* 01/19/2016   Lab Results  Component Value Date   WBC 8.6 02/02/2016   PLT 104* 02/02/2016   Lab Results  Component Value Date   INR 1.09 01/19/2016   Lab Results  Component Value Date   NA 136 02/01/2016   K 3.7 02/01/2016   CL 104 02/01/2016   CO2 25 02/01/2016   BUN 10 02/01/2016   CREATININE 0.73 02/01/2016   GLUCOSE 112* 02/01/2016    Discharge Medications:     Medication List    STOP taking these medications        aspirin 81 MG tablet  Replaced by:  aspirin EC 325 MG tablet     ondansetron 4 MG disintegrating tablet  Commonly known as:  ZOFRAN ODT      TAKE these medications        ABC PLUS SENIOR Tabs  Take 1 tablet by mouth every morning.     albuterol 108 (90 Base) MCG/ACT inhaler  Commonly known as:  PROVENTIL HFA;VENTOLIN HFA  Inhale 2 puffs into the lungs every 6 (six) hours as needed for wheezing or shortness of breath.     aspirin EC 325 MG tablet  Take 1  tablet (325 mg total) by mouth 2 (two) times daily.     atorvastatin 10 MG tablet  Commonly known as:  LIPITOR  Take 1 tablet by mouth every morning.     Calcium Carbonate-Vitamin D 600-400 MG-UNIT tablet  Take 1 tablet by mouth every morning.     docusate sodium 100 MG capsule  Commonly known as:  COLACE  Take 100 mg by mouth at bedtime.     folic acid 1 MG tablet  Commonly known as:  FOLVITE  Take 1 mg by mouth every morning.     HYDROcodone-acetaminophen 5-325 MG tablet  Commonly known as:  NORCO/VICODIN  Take 1 tablet by mouth every 6 (six) hours as needed for moderate pain.     Loratadine 5 MG/5ML Soln  Take 10 mLs by mouth at bedtime.     methocarbamol 750 MG tablet  Commonly known as:  ROBAXIN  Take 1 tablet (750 mg total) by mouth 2 (two) times daily as needed for muscle spasms.     methotrexate 2.5 MG tablet  Commonly known as:  RHEUMATREX   Take 15 mg by mouth once a week. Wednesdays     ondansetron 4 MG tablet  Commonly known as:  ZOFRAN  Take 1-2 tablets (4-8 mg total) by mouth every 8 (eight) hours as needed for nausea or vomiting.     oxyCODONE 5 MG immediate release tablet  Commonly known as:  Oxy IR/ROXICODONE  Take 1-3 tablets (5-15 mg total) by mouth every 4 (four) hours as needed.     oxyCODONE 10 mg 12 hr tablet  Commonly known as:  OXYCONTIN  Take 1 tablet (10 mg total) by mouth every 12 (twelve) hours.     polyethylene glycol packet  Commonly known as:  MIRALAX / GLYCOLAX  Take 17 g by mouth daily as needed.     Potassium Gluconate 595 MG Caps  Take 1 capsule by mouth at bedtime.     senna-docusate 8.6-50 MG tablet  Commonly known as:  SENOKOT S  Take 1 tablet by mouth at bedtime as needed.     Tiotropium Bromide-Olodaterol 2.5-2.5 MCG/ACT Aers  Commonly known as:  STIOLTO RESPIMAT  Inhale 2 puffs into the lungs daily.     traMADol 50 MG tablet  Commonly known as:  ULTRAM  Take 50 mg by mouth every 6 (six) hours as needed for moderate pain.     valsartan-hydrochlorothiazide 160-12.5 MG tablet  Commonly known as:  DIOVAN-HCT  Take 1 tablet by mouth every morning.     vitamin B-12 500 MCG tablet  Commonly known as:  CYANOCOBALAMIN  Take 2,500 mcg by mouth every morning.        Diagnostic Studies: Dg Knee Left Port  01/31/2016  CLINICAL DATA:  Left total knee replacement. EXAM: PORTABLE LEFT KNEE - 1-2 VIEW COMPARISON:  None. FINDINGS: Left total knee arthroplasty. Joint air and fluid are present. No acute osseous abnormality. IMPRESSION: Left total knee arthroplasty with expected postoperative findings. Electronically Signed   By: Lorin Picket M.D.   On: 01/31/2016 15:02    Disposition: 01-Home or Self Care      Discharge Instructions    Call MD / Call 911    Complete by:  As directed   If you experience chest pain or shortness of breath, CALL 911 and be transported to the hospital  emergency room.  If you develope a fever above 101.5 F, pus (white drainage) or increased drainage or redness at the wound,  or calf pain, call your surgeon's office.     Constipation Prevention    Complete by:  As directed   Drink plenty of fluids.  Prune juice may be helpful.  You may use a stool softener, such as Colace (over the counter) 100 mg twice a day.  Use MiraLax (over the counter) for constipation as needed.     Diet - low sodium heart healthy    Complete by:  As directed      Diet general    Complete by:  As directed      Driving restrictions    Complete by:  As directed   No driving while taking narcotic pain meds.     Increase activity slowly as tolerated    Complete by:  As directed            Follow-up Information    Follow up with Marianna Payment, MD In 2 weeks.   Specialty:  Orthopedic Surgery   Why:  For suture removal, For wound re-check   Contact information:   [place] [address] [phone]       Follow up with [facility].   Why:  They will contact you to schedule home therapy visits.    Contact information:   [address] [place] [address] [phone]        Signed: Marianna Payment 02/02/2016, 9:48 AM

## 2016-02-02 NOTE — Progress Notes (Signed)
Physical Therapy Treatment Patient Details Name: Casey Wilkinson MRN: [id number] DOB: November 24, 1940 Today's Date: 02/10/2016    History of Present Illness 75 yo admitted for L TKA. PMHx: colon cA, skin CA, OA, HTN, COPD    PT Comments    Pt with excellent progression with gait and HEP. Pt educated for stairs, HEP, gait and increased mobility. Pt continues to require 4L O2 with gait to maintain 90% with pursed lip breathing cues. 94% on 2L at rest with drop to 88% on 2.5L with HEP.   Follow Up Recommendations  Home health PT;Supervision for mobility/OOB     Equipment Recommendations       Recommendations for Other Services       Precautions / Restrictions Precautions Precautions: Fall;Knee Precaution Comments: watch sats  Restrictions Weight Bearing Restrictions: Yes LLE Weight Bearing: Weight bearing as tolerated    Mobility  Bed Mobility               General bed mobility comments: patient in chair on arrival  Transfers Overall transfer level: Needs assistance Equipment used: None Transfers: Sit to/from Stand Sit to Stand: supervision         General transfer comment: supervision for safety and O2  Ambulation/Gait Ambulation/Gait assistance: Supervision Ambulation Distance (Feet): 300 Feet Assistive device: Rolling walker (2 wheeled) Gait Pattern/deviations: Step-through pattern;Decreased stride length   Gait velocity interpretation: Below normal speed for age/gender General Gait Details: cues for posture, pursed lip breathing, increased dorsiflexion, pursed lip breathing   Stairs Stairs: Yes Stairs assistance: Min assist Stair Management: Backwards;With walker Number of Stairs: 4 General stair comments: cues for sequence with assist to stabilize RW  Wheelchair Mobility    Modified Rankin (Stroke Patients Only)          Cognition Arousal/Alertness: Awake/alert Behavior During Therapy: WFL for tasks assessed/performed Overall Cognitive  Status: Within Functional Limits for tasks assessed                      Exercises Total Joint Exercises Heel Slides: AAROM;Left;Seated;15 reps Hip ABduction/ADduction: AROM;Seated;Left;15 reps Straight Leg Raises: Left;Seated;AROM;15 reps Long Arc Quad: AROM;Seated;Left;15 reps Goniometric ROM: 4-74    General Comments        Pertinent Vitals/Pain Pain Assessment: Faces Pain Score: 4  Faces Pain Scale: Hurts little more Pain Location: right knee Pain Descriptors / Indicators: Sore ("stiff") Pain Intervention(s): Monitored during session;Repositioned, premedicated, ice applied    Home Living                      Prior Function            PT Goals (current goals can now be found in the care plan section) Acute Rehab PT Goals Patient Stated Goal: go home today Progress towards PT goals: Progressing toward goals    Frequency       PT Plan Current plan remains appropriate    Co-evaluation             End of Session Equipment Utilized During Treatment: Gait belt;Oxygen Activity Tolerance: Patient tolerated treatment well Patient left: with call bell/phone within reach;in chair     Time: 0827-0906 PT Time Calculation (min) (ACUTE ONLY): 39 min  Charges:  $Gait Training: 23-37 mins $Therapeutic Exercise: 8-22 mins                    G Codes:      Melford Aase 10-Feb-2016, 9:13 AM Lanetta Inch Mikenzi Raysor,  PT [phone]

## 2016-02-02 NOTE — Progress Notes (Signed)
Occupational Therapy Treatment Patient Details Name: Casey Wilkinson MRN: [id number] DOB: 10-23-1940 Today's Date: 02/02/2016    History of present illness 75 yo admitted for L TKA. PMHx: colon cA, skin CA, OA, HTN, COPD   OT comments  Patient progressing nicely towards OT goals, continue plan of care for now. Pt continues to be limited by decreased cardiopulmonary support. On RA at rest and during activity, patient's sats ranged from 74%-81%. With 2L/min supplemental 02 at rest and during activity, patient's sats =91%-93%.    Follow Up Recommendations  No OT follow up;Supervision - Intermittent    Equipment Recommendations  None recommended by OT    Recommendations for Other Services  None at this time   Precautions / Restrictions Precautions Precautions: Fall;Knee Precaution Comments: watch sats  Restrictions Weight Bearing Restrictions: Yes LLE Weight Bearing: Weight bearing as tolerated    Mobility Bed Mobility General bed mobility comments: patient in chair on arrival  Transfers Overall transfer level: Needs assistance Equipment used: None;Rolling walker (2 wheeled) Transfers: Sit to/from Stand Sit to Stand: Min guard General transfer comment: Min guard for safety, cues for safety and LLE management/positioning     Balance Overall balance assessment: Needs assistance Sitting-balance support: No upper extremity supported;Feet supported Sitting balance-Leahy Scale: Good     Standing balance support: No upper extremity supported;During functional activity Standing balance-Leahy Scale: Fair Standing balance comment: Pt standing at sink for grooming tasks, pt reliant on RW during functional ambulation   ADL Overall ADL's : Needs assistance/impaired General ADL Comments: Pt found seated in recliner upon OT entering room. Pt adjusted sock while seated in recliner. Pt's sats on 2L/min supplemental 02=92%. Therapist doffed supplemental 02. Pt stood with RW and practicied  simulated walk-in shower transfer using 3-n-1 and RW. Pt then sat on [facility] for toileting needs. Pt's sats on RA=72%, therapist donned [place] and 2L/min supplemental 02. Pt stood at sink from here for grooming tasks of washing face and brushing teeth. Pt then ambulated back to recliner to doff brief and don new brief, therapist going over compensatory strategies for LB dressing. While pt on 2L/min supplemental 02, pt's sats=93% at end of session.      Vision Additional Comments: Pt reports recent cataract surgery          Cognition   Behavior During Therapy: WFL for tasks assessed/performed Overall Cognitive Status: Within Functional Limits for tasks assessed                 Pertinent Vitals/ Pain       Pain Assessment: Faces Pain Score: 4  Faces Pain Scale: Hurts little more Pain Location: right knee Pain Descriptors / Indicators: Sore ("stiff") Pain Intervention(s): Monitored during session;Repositioned   Frequency Min 2X/week     Progress Toward Goals  OT Goals(current goals can now befound in the care plan section)  Progress towards OT goals: Progressing toward goals  Acute Rehab OT Goals Patient Stated Goal: go home today OT Goal Formulation: With patient Time For Goal Achievement: 02/08/16 Potential to Achieve Goals: Good  Plan Discharge plan remains appropriate    End of Session Equipment Utilized During Treatment: Rolling walker;Oxygen   Activity Tolerance Patient tolerated treatment well;Other (comment) (somewhat limited by decreased cardiopulmonary support)   Patient Left in chair;with call bell/phone within reach;with family/visitor present  Nurse Communication Mobility status;Other (comment) (desats when on RA)     Time: 7062-3762 OT Time Calculation (min): 28 min  Charges: OT General Charges $OT Visit: 1 Procedure OT  Treatments $Self Care/Home Management : 23-37 mins  Chrys Racer , MS, OTR/L, [place] Pager: [phone]  02/02/2016, 8:47 AM

## 2016-02-02 NOTE — Progress Notes (Signed)
SATURATION QUALIFICATIONS: (This note is used to comply with regulatory documentation for home oxygen)  Patient Saturations on Room Air at Rest = 84%  Patient Saturations on 2L at rest= 94%  Patient Saturations on 4 Liters of oxygen while Ambulating = 90%  Please briefly explain why patient needs home oxygen:pt desaturating with all activity without supplemental oxygen Casey Wilkinson, [place]

## 2016-02-02 NOTE — Progress Notes (Signed)
   Subjective:  Patient reports pain as mild.  Objective:   VITALS:   Filed Vitals:   02/01/16 1432 02/01/16 1950 02/02/16 0500 02/02/16 0830  BP: 129/46 131/60 138/56   Pulse: 87 88 83   Temp: 98.3 F (36.8 C) 98 F (36.7 C) 98.1 F (36.7 C)   TempSrc: Oral Oral Oral   Resp: '18 18 18   '$ SpO2: 96% 97% 97% 94%    Neurologically intact Neurovascular intact Sensation intact distally Intact pulses distally Dorsiflexion/Plantar flexion intact Incision: dressing C/D/I and no drainage No cellulitis present Compartment soft Incision c/d/i  Lab Results  Component Value Date   WBC 8.6 02/02/2016   HGB 7.7* 02/02/2016   HCT 23.9* 02/02/2016   MCV 93.0 02/02/2016   PLT 104* 02/02/2016     Assessment/Plan:  2 Days Post-Op   - Expected postop acute blood loss anemia - will monitor for symptoms; will transfuse 1 unit RBCs  - Up with PT/OT - doing well - DVT ppx - SCDs, ambulation, aspirin - WBAT operative extremity - dressing changed, needs dry dressing changes daily - Pain control - home oxygen therapy ordered for desats with exertion and at rest; patient has h/o COPD - home today  Marianna Payment 02/02/2016, 9:46 AM [phone]

## 2016-02-03 DIAGNOSIS — Z471 Aftercare following joint replacement surgery: Secondary | ICD-10-CM | POA: Diagnosis not present

## 2016-02-03 DIAGNOSIS — Z96652 Presence of left artificial knee joint: Secondary | ICD-10-CM | POA: Diagnosis not present

## 2016-02-03 DIAGNOSIS — F17211 Nicotine dependence, cigarettes, in remission: Secondary | ICD-10-CM | POA: Diagnosis not present

## 2016-02-03 DIAGNOSIS — I10 Essential (primary) hypertension: Secondary | ICD-10-CM | POA: Diagnosis not present

## 2016-02-03 DIAGNOSIS — J449 Chronic obstructive pulmonary disease, unspecified: Secondary | ICD-10-CM | POA: Diagnosis not present

## 2016-02-03 DIAGNOSIS — M329 Systemic lupus erythematosus, unspecified: Secondary | ICD-10-CM | POA: Diagnosis not present

## 2016-02-03 DIAGNOSIS — Z9981 Dependence on supplemental oxygen: Secondary | ICD-10-CM | POA: Diagnosis not present

## 2016-02-03 LAB — TYPE AND SCREEN
ABO/RH(D): O POS
Antibody Screen: NEGATIVE
Unit division: 0

## 2016-02-05 ENCOUNTER — Observation Stay (HOSPITAL_COMMUNITY)
Admission: AD | Admit: 2016-02-05 | Discharge: 2016-02-07 | Disposition: A | Discharge status: Home Health | Payer: No Typology Code available for payment source | Source: Ambulatory Visit | Attending: Orthopaedic Surgery | Admitting: Orthopaedic Surgery

## 2016-02-05 ENCOUNTER — Encounter (HOSPITAL_COMMUNITY): Payer: Self-pay | Admitting: General Practice

## 2016-02-05 DIAGNOSIS — Z87891 Personal history of nicotine dependence: Secondary | ICD-10-CM | POA: Diagnosis not present

## 2016-02-05 DIAGNOSIS — Z85038 Personal history of other malignant neoplasm of large intestine: Secondary | ICD-10-CM | POA: Insufficient documentation

## 2016-02-05 DIAGNOSIS — Z85828 Personal history of other malignant neoplasm of skin: Secondary | ICD-10-CM | POA: Insufficient documentation

## 2016-02-05 DIAGNOSIS — M329 Systemic lupus erythematosus, unspecified: Secondary | ICD-10-CM | POA: Diagnosis not present

## 2016-02-05 DIAGNOSIS — Z09 Encounter for follow-up examination after completed treatment for conditions other than malignant neoplasm: Principal | ICD-10-CM | POA: Insufficient documentation

## 2016-02-05 DIAGNOSIS — Z96652 Presence of left artificial knee joint: Secondary | ICD-10-CM | POA: Diagnosis not present

## 2016-02-05 DIAGNOSIS — Z9049 Acquired absence of other specified parts of digestive tract: Secondary | ICD-10-CM | POA: Insufficient documentation

## 2016-02-05 DIAGNOSIS — L409 Psoriasis, unspecified: Secondary | ICD-10-CM | POA: Diagnosis not present

## 2016-02-05 DIAGNOSIS — Z7982 Long term (current) use of aspirin: Secondary | ICD-10-CM | POA: Insufficient documentation

## 2016-02-05 DIAGNOSIS — D649 Anemia, unspecified: Secondary | ICD-10-CM | POA: Insufficient documentation

## 2016-02-05 DIAGNOSIS — Z471 Aftercare following joint replacement surgery: Secondary | ICD-10-CM | POA: Diagnosis not present

## 2016-02-05 DIAGNOSIS — J449 Chronic obstructive pulmonary disease, unspecified: Secondary | ICD-10-CM | POA: Insufficient documentation

## 2016-02-05 DIAGNOSIS — M1990 Unspecified osteoarthritis, unspecified site: Secondary | ICD-10-CM | POA: Insufficient documentation

## 2016-02-05 DIAGNOSIS — I10 Essential (primary) hypertension: Secondary | ICD-10-CM | POA: Diagnosis not present

## 2016-02-05 DIAGNOSIS — Z9981 Dependence on supplemental oxygen: Secondary | ICD-10-CM | POA: Diagnosis not present

## 2016-02-05 DIAGNOSIS — Z96659 Presence of unspecified artificial knee joint: Secondary | ICD-10-CM

## 2016-02-05 DIAGNOSIS — Z79899 Other long term (current) drug therapy: Secondary | ICD-10-CM | POA: Diagnosis not present

## 2016-02-05 DIAGNOSIS — L930 Discoid lupus erythematosus: Secondary | ICD-10-CM

## 2016-02-05 HISTORY — DX: Malignant neoplasm of colon, unspecified: C18.9

## 2016-02-05 HISTORY — DX: Squamous cell carcinoma of skin of unspecified lower limb, including hip: C44.721

## 2016-02-05 HISTORY — DX: Arthropathic psoriasis, unspecified: L40.50

## 2016-02-05 MED ORDER — ACETAMINOPHEN 650 MG RE SUPP: 650.0000 mg | Freq: Four times a day (QID) | RECTAL | Status: DC | PRN

## 2016-02-05 MED ORDER — HYDROCODONE-ACETAMINOPHEN 5-325 MG PO TABS: 1.0000 | ORAL_TABLET | Freq: Four times a day (QID) | ORAL | Status: DC | PRN

## 2016-02-05 MED ORDER — DEXTROSE 5 % IV SOLN
500.0000 mg | Freq: Four times a day (QID) | INTRAVENOUS | Status: DC | PRN
  Filled 2016-02-05: qty 5

## 2016-02-05 MED ORDER — ONDANSETRON HCL 4 MG PO TABS: 4.0000 mg | ORAL_TABLET | Freq: Three times a day (TID) | ORAL | Status: DC | PRN

## 2016-02-05 MED ORDER — LORATADINE 10 MG PO TABS
10.0000 mg | ORAL_TABLET | Freq: Every day | ORAL | Status: DC
  Administered 2016-02-05 – 2016-02-06 (×2): 10 mg via ORAL
  Filled 2016-02-05 (×2): qty 1

## 2016-02-05 MED ORDER — PREDNISONE 10 MG (21) PO TBPK
20.0000 mg | ORAL_TABLET | Freq: Every morning | ORAL | Status: AC
  Administered 2016-02-06: 20 mg via ORAL
  Filled 2016-02-05: qty 21

## 2016-02-05 MED ORDER — METHOTREXATE 2.5 MG PO TABS
15.0000 mg | ORAL_TABLET | ORAL | Status: DC
  Filled 2016-02-05: qty 6

## 2016-02-05 MED ORDER — SENNOSIDES-DOCUSATE SODIUM 8.6-50 MG PO TABS: 1.0000 | ORAL_TABLET | Freq: Every evening | ORAL | Status: DC | PRN

## 2016-02-05 MED ORDER — PREDNISONE 10 MG (21) PO TBPK: 10.0000 mg | ORAL_TABLET | ORAL | Status: AC

## 2016-02-05 MED ORDER — PREDNISONE 10 MG (21) PO TBPK
10.0000 mg | ORAL_TABLET | ORAL | Status: AC
Start: 2016-02-05 — End: 2016-02-05
  Administered 2016-02-05: 10 mg via ORAL

## 2016-02-05 MED ORDER — ACETAMINOPHEN 325 MG PO TABS
650.0000 mg | ORAL_TABLET | Freq: Four times a day (QID) | ORAL | Status: DC | PRN
Start: 2016-02-05 — End: 2016-02-07

## 2016-02-05 MED ORDER — ADULT MULTIVITAMIN W/MINERALS CH
1.0000 | ORAL_TABLET | Freq: Every morning | ORAL | Status: DC
  Administered 2016-02-06 – 2016-02-07 (×2): 1 via ORAL
  Filled 2016-02-05 (×4): qty 1

## 2016-02-05 MED ORDER — VALSARTAN-HYDROCHLOROTHIAZIDE 160-12.5 MG PO TABS: 1.0000 | ORAL_TABLET | Freq: Every morning | ORAL | Status: DC

## 2016-02-05 MED ORDER — POLYETHYLENE GLYCOL 3350 17 G PO PACK: 17.0000 g | PACK | Freq: Every day | ORAL | Status: DC | PRN

## 2016-02-05 MED ORDER — CALCIUM CARBONATE-VITAMIN D 500-200 MG-UNIT PO TABS
1.0000 | ORAL_TABLET | Freq: Every morning | ORAL | Status: DC
  Administered 2016-02-06 – 2016-02-07 (×2): 1 via ORAL
  Filled 2016-02-05 (×3): qty 1

## 2016-02-05 MED ORDER — IRBESARTAN 150 MG PO TABS
150.0000 mg | ORAL_TABLET | Freq: Every day | ORAL | Status: DC
  Administered 2016-02-06 – 2016-02-07 (×2): 150 mg via ORAL
  Filled 2016-02-05 (×2): qty 1

## 2016-02-05 MED ORDER — PREDNISONE 10 MG (21) PO TBPK
10.0000 mg | ORAL_TABLET | Freq: Three times a day (TID) | ORAL | Status: AC
  Administered 2016-02-06 (×3): 10 mg via ORAL

## 2016-02-05 MED ORDER — POTASSIUM GLUCONATE 595 MG PO CAPS: 1.0000 | ORAL_CAPSULE | Freq: Every day | ORAL | Status: DC

## 2016-02-05 MED ORDER — METHOCARBAMOL 500 MG PO TABS
500.0000 mg | ORAL_TABLET | Freq: Four times a day (QID) | ORAL | Status: DC | PRN
  Administered 2016-02-05 – 2016-02-06 (×2): 500 mg via ORAL
  Filled 2016-02-05 (×2): qty 1

## 2016-02-05 MED ORDER — OXYCODONE HCL 5 MG PO TABS
5.0000 mg | ORAL_TABLET | ORAL | Status: DC | PRN
  Administered 2016-02-05 – 2016-02-07 (×4): 10 mg via ORAL
  Filled 2016-02-05 (×4): qty 2

## 2016-02-05 MED ORDER — DOCUSATE SODIUM 100 MG PO CAPS
100.0000 mg | ORAL_CAPSULE | Freq: Every day | ORAL | Status: DC
  Administered 2016-02-06: 100 mg via ORAL
  Filled 2016-02-05: qty 1

## 2016-02-05 MED ORDER — TRAMADOL HCL 50 MG PO TABS
50.0000 mg | ORAL_TABLET | Freq: Four times a day (QID) | ORAL | Status: DC | PRN
  Administered 2016-02-05: 50 mg via ORAL
  Filled 2016-02-05: qty 1

## 2016-02-05 MED ORDER — PREDNISONE 10 MG (21) PO TBPK
10.0000 mg | ORAL_TABLET | Freq: Four times a day (QID) | ORAL | Status: DC
  Administered 2016-02-07: 10 mg via ORAL

## 2016-02-05 MED ORDER — METHOCARBAMOL 750 MG PO TABS: 750.0000 mg | ORAL_TABLET | Freq: Two times a day (BID) | ORAL | Status: DC | PRN

## 2016-02-05 MED ORDER — ALBUTEROL SULFATE (2.5 MG/3ML) 0.083% IN NEBU
2.5000 mg | INHALATION_SOLUTION | Freq: Four times a day (QID) | RESPIRATORY_TRACT | Status: DC | PRN
  Administered 2016-02-05: 2.5 mg via RESPIRATORY_TRACT
  Filled 2016-02-05: qty 3

## 2016-02-05 MED ORDER — MORPHINE SULFATE (PF) 2 MG/ML IV SOLN: 1.0000 mg | INTRAVENOUS | Status: DC | PRN

## 2016-02-05 MED ORDER — ATORVASTATIN CALCIUM 10 MG PO TABS
10.0000 mg | ORAL_TABLET | Freq: Every morning | ORAL | Status: DC
  Administered 2016-02-06 – 2016-02-07 (×2): 10 mg via ORAL
  Filled 2016-02-05 (×2): qty 1

## 2016-02-05 MED ORDER — ASPIRIN EC 325 MG PO TBEC
325.0000 mg | DELAYED_RELEASE_TABLET | Freq: Two times a day (BID) | ORAL | Status: DC
  Administered 2016-02-05 – 2016-02-07 (×4): 325 mg via ORAL
  Filled 2016-02-05 (×4): qty 1

## 2016-02-05 MED ORDER — PREDNISONE 10 MG (21) PO TBPK
20.0000 mg | ORAL_TABLET | Freq: Every evening | ORAL | Status: AC
  Administered 2016-02-06: 20 mg via ORAL

## 2016-02-05 MED ORDER — PREDNISONE 10 MG (21) PO TBPK
20.0000 mg | ORAL_TABLET | Freq: Every evening | ORAL | Status: AC
  Administered 2016-02-05: 20 mg via ORAL

## 2016-02-05 MED ORDER — HYDROCHLOROTHIAZIDE 12.5 MG PO CAPS
12.5000 mg | ORAL_CAPSULE | Freq: Every day | ORAL | Status: DC
  Administered 2016-02-06 – 2016-02-07 (×2): 12.5 mg via ORAL
  Filled 2016-02-05 (×2): qty 1

## 2016-02-05 MED ORDER — FOLIC ACID 1 MG PO TABS
1.0000 mg | ORAL_TABLET | Freq: Every morning | ORAL | Status: DC
  Administered 2016-02-06 – 2016-02-07 (×2): 1 mg via ORAL
  Filled 2016-02-05 (×2): qty 1

## 2016-02-05 MED ORDER — CYANOCOBALAMIN 500 MCG PO TABS
2500.0000 ug | ORAL_TABLET | Freq: Every morning | ORAL | Status: DC
  Administered 2016-02-06: 2500 ug via ORAL
  Filled 2016-02-05 (×2): qty 5

## 2016-02-05 NOTE — H&P (Signed)
ORTHOPAEDIC HISTORY AND PHYSICAL   Chief Complaint: s/p left TKA  HPI: Casey Wilkinson is a 75 y.o. female who is s/p left TKA last Wednesday was discharged home with HHPT.  She unfortunately is unable to perform her ADLs even with assistance with her husband.  He recently had back surgery.  Patient presented today with this social issue and flare up of her psoriasis.  Decision was made to admit her and have her placed in SNF post discharge.  Past Medical History  Diagnosis Date  . Discoid lupus erythematosus   . Hypertension   . COPD (chronic obstructive pulmonary disease) ([place])   . Anemia   . Colitis April 2016  . Diverticulitis April 2016    and Colitis  . Arthritis   . Psoriasis   . Carotid artery stenosis     right side followed by [facility]- 65%  . Shortness of breath dyspnea     with exertion  . Cancer ([place])     colon.  Skin cancer - legs Squamous cell  . History of blood transfusion     4/23016, 06/2015- colectomy   Past Surgical History  Procedure Laterality Date  . Hemmroids  2006  . Recocele/entracele  2006  . Total vaginal hysterectomy  1968  . Colonoscopy  06/23/15  . Colonoscopy w/ polypectomy    . Laparoscopic partial colectomy N/A 06/23/2015    Procedure: LAPAROSCOPIC ILEOCECETOMY;  Surgeon: Excell Seltzer, MD;  Location: [facility];  Service: General;  Laterality: N/A;  . Eye surgery Bilateral     cataract with lens  . Total knee arthroplasty Left 01/31/2016    Procedure: LEFT TOTAL KNEE ARTHROPLASTY;  Surgeon: Leandrew Koyanagi, MD;  Location: [facility];  Service: Orthopedics;  Laterality: Left;   Social History   Social History  . Marital Status: Married    Spouse Name: N/A  . Number of Children: 2  . Years of Education: N/A   Occupational History  . retired     Secretary/receptionist [phone]   Social History Main Topics  . Smoking status: Former Smoker -- 0.50 packs/day for 50 years    Types: Cigarettes    Quit date: 12/05/2000  . Smokeless tobacco: Never  Used     Comment: started smoking in early 20s  . Alcohol Use: No  . Drug Use: No  . Sexual Activity: Not on file   Other Topics Concern  . Not on file   Social History Narrative   Family History  Problem Relation Age of Onset  . Heart disease Father     Before age 76 . Hyperlipidemia Father   . Hypertension Father   . Pneumonia Mother   . Alzheimer's disease Mother    Allergies  Allergen Reactions  . Prochlorperazine Edisylate Nausea Only and Other (See Comments)    **COMPAZINE**   Stroke-like symptoms  . Sulfamethoxazole Nausea Only and Other (See Comments)    Pt taking Methotrexate, Sulfur drugs could cause SEVERE fatal reaction.  . Doxycycline Hives   Prior to Admission medications   Medication Sig Start Date End Date Taking? Authorizing Provider  albuterol (PROVENTIL HFA;VENTOLIN HFA) 108 (90 BASE) MCG/ACT inhaler Inhale 2 puffs into the lungs every 6 (six) hours as needed for wheezing or shortness of breath.     Historical Provider, MD  aspirin EC 325 MG tablet Take 1 tablet (325 mg total) by mouth 2 (two) times daily. 01/31/16   Elmond Poehlman MEphriam Jenkins MD  atorvastatin (LIPITOR) 10 MG tablet  Take 1 tablet by mouth every morning.  02/14/14   Historical Provider, MD  Calcium Carbonate-Vitamin D 600-400 MG-UNIT per tablet Take 1 tablet by mouth every morning.     Historical Provider, MD  docusate sodium (COLACE) 100 MG capsule Take 100 mg by mouth at bedtime.     Historical Provider, MD  folic acid (FOLVITE) 1 MG tablet Take 1 mg by mouth every morning.     Historical Provider, MD  HYDROcodone-acetaminophen (NORCO/VICODIN) 5-325 MG tablet Take 1 tablet by mouth every 6 (six) hours as needed for moderate pain.    Historical Provider, MD  Loratadine 5 MG/5ML SOLN Take 10 mLs by mouth at bedtime.    Historical Provider, MD  methocarbamol (ROBAXIN) 750 MG tablet Take 1 tablet (750 mg total) by mouth 2 (two) times daily as needed for muscle spasms. 01/31/16   Ronnae Kaser Ephriam Jenkins, MD  methotrexate  (RHEUMATREX) 2.5 MG tablet Take 15 mg by mouth once a week. Wednesdays    Historical Provider, MD  Multiple Vitamins-Minerals (ABC PLUS SENIOR) TABS Take 1 tablet by mouth every morning.     Historical Provider, MD  ondansetron (ZOFRAN) 4 MG tablet Take 1-2 tablets (4-8 mg total) by mouth every 8 (eight) hours as needed for nausea or vomiting. 01/31/16   Leandrew Koyanagi, MD  oxyCODONE (OXY IR/ROXICODONE) 5 MG immediate release tablet Take 1-3 tablets (5-15 mg total) by mouth every 4 (four) hours as needed. 01/31/16   Leandrew Koyanagi, MD  oxyCODONE (OXYCONTIN) 10 mg 12 hr tablet Take 1 tablet (10 mg total) by mouth every 12 (twelve) hours. 01/31/16   Konya Fauble Ephriam Jenkins, MD  polyethylene glycol (MIRALAX / GLYCOLAX) packet Take 17 g by mouth daily as needed.    Historical Provider, MD  Potassium Gluconate 595 MG CAPS Take 1 capsule by mouth at bedtime.     Historical Provider, MD  senna-docusate (SENOKOT S) 8.6-50 MG tablet Take 1 tablet by mouth at bedtime as needed. 01/31/16   Leandrew Koyanagi, MD  Tiotropium Bromide-Olodaterol (STIOLTO RESPIMAT) 2.5-2.5 MCG/ACT AERS Inhale 2 puffs into the lungs daily. 12/12/15   Collene Gobble, MD  traMADol (ULTRAM) 50 MG tablet Take 50 mg by mouth every 6 (six) hours as needed for moderate pain.     Historical Provider, MD  valsartan-hydrochlorothiazide (DIOVAN-HCT) 160-12.5 MG per tablet Take 1 tablet by mouth every morning.  02/11/15   Historical Provider, MD  vitamin B-12 (CYANOCOBALAMIN) 500 MCG tablet Take 2,500 mcg by mouth every morning.     Historical Provider, MD   No results found. - pertinent xrays, CT, MRI studies were reviewed and independently interpreted  Positive ROS: All other systems have been reviewed and were otherwise negative with the exception of those mentioned in the HPI and as above.  Physical Exam: General: Alert, no acute distress Cardiovascular: No pedal edema Respiratory: No cyanosis, no use of accessory musculature GI: No organomegaly, abdomen is  soft and non-tender Skin: No lesions in the area of chief complaint Neurologic: Sensation intact distally Psychiatric: Patient is competent for consent with normal mood and affect Lymphatic: No axillary or cervical lymphadenopathy  MUSCULOSKELETAL:  - left knee incision c/d/i no signs of infection - skin plaques and reactive changes c/w psoriasis - few small skin blisters and tears over distal shin  Assessment: 1. S/p Left TKA 2. psoriasis  Plan: - will admit for 3 days and transfer to SNF - aspirin BID for DVT ppx - encourage IS, PT - oxygen  as needed - sterapred taper for psoriasis flare up - xeroform to skin tear and blisters - dry dressing to left knee incision - appreciate SW and CM assistance with her care - PT/OT daily   N. Eduard Roux, MD [place] 5:04 PM

## 2016-02-06 DIAGNOSIS — J449 Chronic obstructive pulmonary disease, unspecified: Secondary | ICD-10-CM | POA: Diagnosis not present

## 2016-02-06 DIAGNOSIS — L409 Psoriasis, unspecified: Secondary | ICD-10-CM | POA: Diagnosis not present

## 2016-02-06 DIAGNOSIS — I10 Essential (primary) hypertension: Secondary | ICD-10-CM | POA: Diagnosis not present

## 2016-02-06 DIAGNOSIS — D649 Anemia, unspecified: Secondary | ICD-10-CM | POA: Diagnosis not present

## 2016-02-06 DIAGNOSIS — Z96652 Presence of left artificial knee joint: Secondary | ICD-10-CM | POA: Diagnosis not present

## 2016-02-06 DIAGNOSIS — Z09 Encounter for follow-up examination after completed treatment for conditions other than malignant neoplasm: Secondary | ICD-10-CM | POA: Diagnosis not present

## 2016-02-06 NOTE — Clinical Social Work Placement (Signed)
   CLINICAL SOCIAL WORK PLACEMENT  NOTE  Date:  02/06/2016  Patient Details  Name: [name] MRN: [id number] Date of Birth: [birth year]  Clinical Social Work is seeking post-discharge placement for this patient at the [place] level of care (*CSW will initial, date and re-position this form in  chart as items are completed):  Yes (Patient already had preference on facilities.)   Patient/family provided with [place] Work Department's list of facilities offering this level of care within the geographic area requested by the patient (or if unable, by the patient's family).  Yes   Patient/family informed of their freedom to choose among providers that offer the needed level of care, that participate in Medicare, Medicaid or managed care program needed by the patient, have an available bed and are willing to accept the patient.  Yes (Patient already had preference on facilities.)   Patient/family informed of [facility]'s ownership interest in [facility] and [facility], as well as of the fact that they are under no obligation to receive care at these facilities.  PASRR submitted to EDS on 02/06/16     PASRR number received on 02/06/16     Existing PASRR number confirmed on       FL2 transmitted to all facilities in geographic area requested by pt/family on 02/06/16     FL2 transmitted to all facilities within larger geographic area on       Patient informed that his/her managed care company has contracts with or will negotiate with certain facilities, including the following:            Patient/family informed of bed offers received.  Patient chooses bed at       Physician recommends and patient chooses bed at      Patient to be transferred to   on  .  Patient to be transferred to facility by       Patient family notified on   of transfer.  Name of family member notified:        PHYSICIAN Please sign FL2, Please prepare  priority discharge summary, including medications     Additional Comment:    _______________________________________________ Candie Chroman, LCSW 02/06/2016, 12:08 PM

## 2016-02-06 NOTE — Evaluation (Signed)
Occupational Therapy Evaluation Patient Details Name: Casey Wilkinson MRN: [id number] DOB: 01/10/1941 Today's Date: 02/06/2016    History of Present Illness 75 y.o. female who is s/p left TKA last Wednesday was discharged home with HHPT. Spouse is having trouble with his back. Patient presented  with this social issue and flare up of her psoriasis.PMHx: Lt TKA, colon cancer, skin CA, arthritis, anemia, Discoid lupus erythematosus, HTN, COPD   Clinical Impression   Pt admitted with above. Education provided in session. OT signing off.     Follow Up Recommendations  No OT follow up;Supervision - Intermittent    Equipment Recommendations  None recommended by OT    Recommendations for Other Services       Precautions / Restrictions Precautions Precautions: Fall;Knee Restrictions Weight Bearing Restrictions: Yes LLE Weight Bearing: Weight bearing as tolerated      Mobility Bed Mobility               General bed mobility comments: not assessed- pt in chair  Transfers Overall transfer level: Needs assistance Transfers: Sit to/from Stand Sit to Stand: Supervision         General transfer comment: RW in front of pt; set up for RW    Balance Min guard for ambulation without RW; Able to perform functional task while standing without LOB.                           ADL Overall ADL's : Needs assistance/impaired                     Lower Body Dressing: Supervision/safety;Set up;Sit to/from stand   Toilet Transfer: Supervision/safety;Ambulation;RW;Comfort height toilet (and set up for RW)   Toileting- Clothing Manipulation and Hygiene: Supervision/safety (standing)       Functional mobility during ADLs:  (Min guard for ambulation without RW; Supervision for ambulation with RW and set up for RW)  General ADL Comments: Educated on energy conservation techniques and gave pt handout. Educated on safety such as use of bag on walker, sitting for LB  ADLs. Pt plans to sponge bathe for now. Pt able to don/doff left sock.     Vision     Perception     Praxis      Pertinent Vitals/Pain Pain Assessment: 0-10 Pain Score:  (7-8) Pain Location: Lt knee and bilateral heels Pain Descriptors / Indicators: Sore Pain Intervention(s): Monitored during session     Hand Dominance     Extremity/Trunk Assessment Upper Extremity Assessment Upper Extremity Assessment: Generalized weakness (in bilateral shoulder flexors)   Lower Extremity Assessment Lower Extremity Assessment: Defer to PT evaluation LLE Deficits / Details: unable to perform SLR independently       Communication Communication Communication: No difficulties   Cognition Arousal/Alertness: Awake/alert Behavior During Therapy: WFL for tasks assessed/performed Overall Cognitive Status: Within Functional Limits for tasks assessed                     General Comments   edema in left foot-educated to be doing ankle pumps    Exercises Exercises: Other exercises Other Exercises Other Exercises: educated on level 1 theraband exercises and pt performed some-handouts given to pt   Shoulder Instructions      Home Living Family/patient expects to be discharged to:: Skilled nursing facility Living Arrangements: Spouse/significant other Available Help at Discharge:  (pt trying to work out assist at home) Type of Home: House Home Access: Stairs  to enter Entrance Stairs-Number of Steps: 2 Entrance Stairs-Rails: None Home Layout: One level     Bathroom Shower/Tub: Occupational psychologist: Handicapped height     Home Equipment: Environmental consultant - 2 wheels;Cane - single point;Shower seat;Bedside commode;Adaptive equipment          Prior Functioning/Environment Level of Independence: Needs assistance  Gait / Transfers Assistance Needed: using RW, needing assist with bed mobility and transfers.  ADL's / Homemaking Assistance Needed: assist with cooking and  cleaning   Comments: Reports that her husband recently had back surgery and is not able to assist.     OT Diagnosis: Generalized weakness;Acute pain   OT Problem List: Decreased strength;Decreased activity tolerance;Impaired balance (sitting and/or standing);Pain;Increased edema   OT Treatment/Interventions:      OT Goals(Current goals can be found in the care plan section)   OT Frequency:     Barriers to D/C:            Co-evaluation              End of Session Equipment Utilized During Treatment: Rolling walker;Other (comment) (theraband) Nurse Communication: Other (comment) (pt on toilet; no OT follow up)  Activity Tolerance: Patient tolerated treatment well Patient left: with call bell/phone within reach;Other (comment) (on commode)   Time: 6712-4580 OT Time Calculation (min): 21 min Charges:  OT General Charges $OT Visit: 1 Procedure OT Evaluation $OT Eval Moderate Complexity: 1 Procedure G-Codes: OT G-codes **NOT FOR INPATIENT CLASS** Functional Assessment Tool Used: clinical judgment Functional Limitation: Self care Self Care Current Status (D9833): At least 1 percent but less than 20 percent impaired, limited or restricted Self Care Goal Status (A2505): At least 1 percent but less than 20 percent impaired, limited or restricted Self Care Discharge Status [phone]): At least 1 percent but less than 20 percent impaired, limited or restricted  Benito Mccreedy OTR/L [phone] 02/06/2016, 4:05 PM

## 2016-02-06 NOTE — Progress Notes (Signed)
   Subjective:  Patient is doing well. Started prednisone last night  Objective:   VITALS:   Filed Vitals:   02/05/16 2030 02/05/16 2122 02/06/16 0300 02/06/16 0628  BP:  120/46 128/54 118/38  Pulse:  101 83 82  Temp:  98.9 F (37.2 C) 97.8 F (36.6 C) 97.9 F (36.6 C)  TempSrc:  Oral Oral Oral  Resp:  '18 18 18  '$ Height: '5\' 5"'$  (1.651 m)     Weight:      SpO2:  91% 97% 98%    NAD AFVSS Dressings c/d/i On 2L  nonlabored breathing   Lab Results  Component Value Date   WBC 8.6 02/02/2016   HGB 10.3* 02/02/2016   HCT 31.8* 02/02/2016   MCV 93.0 02/02/2016   PLT 104* 02/02/2016     Assessment/Plan:    s/p L TKA  - prednisone taper for psoriasis flare up - wean oxygen as tolerated - pain controlled - up with PT/OT - SW/CM to help with placement at [facility] place - encouraged IS - Russella Dar 02/06/2016, 8:04 AM [phone]

## 2016-02-06 NOTE — Evaluation (Signed)
Physical Therapy Evaluation Patient Details Name: Casey Wilkinson MRN: [id number] DOB: 02-08-1941 Today's Date: 02/06/2016   History of Present Illness  75 y.o. female who is s/p left TKA last Wednesday was discharged home with HHPT. She unfortunately is unable to perform her ADLs even with assistance with her husband. He recently had back surgery. Patient presented today with this social issue and flare up of her psoriasis.PMHx: Lt TKA, colon cA, skin CA, OA, HTN, COPD  Clinical Impression  Pt admitted with above diagnosis. Pt currently with functional limitations due to the deficits listed below (see PT Problem List).  Pt will benefit from skilled PT to increase their independence and safety with mobility to allow discharge to the venue listed below.       Follow Up Recommendations SNF;Supervision for mobility/OOB    Equipment Recommendations  None recommended by PT    Recommendations for Other Services       Precautions / Restrictions Precautions Precautions: Fall;Knee Restrictions Weight Bearing Restrictions: Yes LLE Weight Bearing: Weight bearing as tolerated      Mobility  Bed Mobility               General bed mobility comments: up in chair upon arrival  Transfers Overall transfer level: Needs assistance Equipment used: Rolling walker (2 wheeled) Transfers: Sit to/from Stand Sit to Stand: Min guard         General transfer comment: transfers from chair and toilet  Ambulation/Gait Ambulation/Gait assistance: Min guard Ambulation Distance (Feet): 70 Feet Assistive device: Rolling walker (2 wheeled) Gait Pattern/deviations: Step-to pattern;Decreased weight shift to left;Antalgic Gait velocity: decreased   General Gait Details: cues for even strides, improving as distance progressed.   Stairs            Wheelchair Mobility    Modified Rankin (Stroke Patients Only)       Balance Overall balance assessment: Needs  assistance Sitting-balance support: No upper extremity supported Sitting balance-Leahy Scale: Normal     Standing balance support: During functional activity Standing balance-Leahy Scale: Fair Standing balance comment: using rw for ambulation                             Pertinent Vitals/Pain Pain Assessment: No/denies pain Pain Intervention(s): Monitored during session    [place] expects to be discharged to:: [place]: Spouse/significant other   Type of Home: House Home Access: Stairs to enter Entrance Stairs-Rails: None Entrance Stairs-Number of Steps: 2 Home Layout: One level Home Equipment: Walker - 2 wheels;Cane - single point;Shower seat;Bedside commode      Prior Function Level of Independence: Needs assistance   Gait / Transfers Assistance Needed: using rw, needing assist with bed mobility and transfers.      Comments: Reports that her husband recently had back surgery and is not able to assist.      Hand Dominance        Extremity/Trunk Assessment   Upper Extremity Assessment: Overall WFL for tasks assessed           Lower Extremity Assessment: LLE deficits/detail   LLE Deficits / Details: unable to perform SLR independently     Communication   Communication: No difficulties  Cognition Arousal/Alertness: Awake/alert Behavior During Therapy: WFL for tasks assessed/performed Overall Cognitive Status: Within Functional Limits for tasks assessed  General Comments      Exercises Total Joint Exercises Ankle Circles/Pumps: Both;10 reps;AROM Quad Sets: Strengthening;Left;10 reps Heel Slides: AAROM;Left;10 reps Straight Leg Raises: Strengthening;Left;10 reps (mod assist) Long Arc Quad: Strengthening;Left;10 reps (min assist) Knee Flexion: AAROM;Left;10 reps Goniometric ROM: approximately 90 degrees flexion.       Assessment/Plan    PT Assessment  Patient needs continued PT services  PT Diagnosis Difficulty walking   PT Problem List Decreased strength;Decreased range of motion;Decreased activity tolerance;Decreased balance;Decreased mobility  PT Treatment Interventions DME instruction;Gait training;Stair training;Functional mobility training;Therapeutic activities;Therapeutic exercise;Balance training;Patient/family education   PT Goals (Current goals can be found in the Care Plan section) Acute Rehab PT Goals Patient Stated Goal: get stronger and more independent PT Goal Formulation: With patient Time For Goal Achievement: 02/20/16 Potential to Achieve Goals: Good    Frequency 7X/week   Barriers to discharge Decreased caregiver support      Co-evaluation               End of Session Equipment Utilized During Treatment: Gait belt Activity Tolerance: Patient tolerated treatment well Patient left: in chair;with call bell/phone within reach;Other (comment) (in knee extension stretch) Nurse Communication: Mobility status    Functional Assessment Tool Used: clinical judgment Functional Limitation: Mobility: Walking and moving around Mobility: Walking and Moving Around Current Status (O3606): At least 40 percent but less than 60 percent impaired, limited or restricted Mobility: Walking and Moving Around Goal Status [phone]): At least 20 percent but less than 40 percent impaired, limited or restricted    Time: 0352-4818 PT Time Calculation (min) (ACUTE ONLY): 27 min   Charges:   PT Evaluation $PT Eval Moderate Complexity: 1 Procedure PT Treatments $Gait Training: 8-22 mins   PT G Codes:   PT G-Codes **NOT FOR INPATIENT CLASS** Functional Assessment Tool Used: clinical judgment Functional Limitation: Mobility: Walking and moving around Mobility: Walking and Moving Around Current Status (H9093): At least 40 percent but less than 60 percent impaired, limited or restricted Mobility: Walking and Moving Around Goal Status  [phone]): At least 20 percent but less than 40 percent impaired, limited or restricted    Cassell Clement, PT, [place] Pager [phone] Office [phone]  02/06/2016, 12:26 PM

## 2016-02-06 NOTE — Care Management Note (Signed)
Case Management Note  Patient Details  Name: Casey Wilkinson MRN: [id number] Date of Birth: 1941-02-16  Subjective/Objective:                    Action/Plan:  Patient under observation status , had 2 night inpatient stay last week 01-31-16 to 02-02-16 , medicare will not cover SNF stay . Patient is connected with [facility] , however, their SNF program is not up and running .  Patient would have to private pay to go to SNF .   Patient voices understanding of above . States she cannot afford to private pay for SNF . Patient states she has children and grand children who can stay with her in evenings and over night and weekends . Patient states she will ask friends and church family to assist her through the day. Provided patient with private duty sitter list . She understands she would have to arrange and pay for private duty and is willing if family / friends cannot assist in daytime hours.  Dr Erlinda Hong aware and gave verbal orders for HHRN,PT,OT, aide and SW . Patient wants to continue with [facility] , referral given to [facility]. Expected Discharge Date:                  Expected Discharge Plan:  [place]  In-House Referral:     Discharge planning Services  CM Consult  Post Acute Care Choice:  Home Health Choice offered to:  Patient  DME Arranged:    DME Agency:     HH Arranged:  RN, PT, OT, Nurse's Aide, Social Work [employer] Agency:  [place]  Status of Service:     Medicare Important Message Given:    Date Medicare IM Given:    Medicare IM give by:    Date Additional Medicare IM Given:    Additional Medicare Important Message give by:     If discussed at [place] of Stay Meetings, dates discussed:    Additional Comments:  Marilu Favre, RN 02/06/2016, 2:56 PM

## 2016-02-06 NOTE — Clinical Social Work Note (Addendum)
CSW met with patient. Case manager and fellow CSW were in the room as well. CSW notified patient that [facility] has not yet set their waiver in place and she will not be able to go to a SNF without private pay. Case manager provided patient with a private duty list and will arrange home healthcare. Patient was understanding and agreeable to home health. Family members may be able to stay with her at night and on the weekends. Patient also said that she has neighbors and church family that may be able to assist as well. RNCM to call physician for home health orders.  CSW signing off. Notify if any other social work needs arise.  Dayton Scrape, [place]

## 2016-02-06 NOTE — Care Management Obs Status (Signed)
[place] NOTIFICATION   Patient Details  Name: Casey Wilkinson MRN: [id number] Date of Birth: January 19, 1941   Medicare Observation Status Notification Given:  Yes (psoriasis. admitted for SNF placement)    Marilu Favre, RN 02/06/2016, 10:29 AM

## 2016-02-06 NOTE — NC FL2 (Signed)
[place] LEVEL OF CARE SCREENING TOOL     IDENTIFICATION  Patient Name: Casey Wilkinson Birthdate: 1941-10-17 Sex: female Admission Date (Current Location): 02/05/2016  [facility] and [place] Number:  Herbalist and Address:  [facility], [place] [address], [place], [place] [address]      Provider Number: [id number]  Attending Physician Name and Address:  Leandrew Koyanagi, MD  Relative Name and Phone Number:       Current Level of Care: Hospital Recommended Level of Care: [place] Prior Approval Number:    Date Approved/Denied:   PASRR Number: [id number]  Discharge Plan: SNF    Current Diagnoses: Patient Active Problem List   Diagnosis Date Noted  . 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 Degenerative arthritis of left knee 01/31/2016  . 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 Nausea with vomiting   . CAP (community acquired pneumonia) 02/08/2015  . Acute ischemic colitis ([place])   . Rectal bleeding   . Abdominal pain   . Blood in stool   . 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 Colitis 02/06/2015  . Abdominal mass 02/06/2015  . HTN (hypertension) 02/06/2015  . Peripheral vascular disease ([place]) 02/24/2014  . LUPUS ERYTHEMATOSUS, DISCOID 11/21/2009  . Essential hypertension 12/08/2008  . COPD, mild-Smoker till 2012 12/08/2008    Orientation RESPIRATION BLADDER Height & Weight     Self, Time, Situation, Place  O2 (Nasal Canula at 2 L) Continent Weight: 164 lb 7.4 oz (74.6 kg) (01/19/16) Height:  '5\' 5"'$  (165.1 cm)  BEHAVIORAL SYMPTOMS/MOOD NEUROLOGICAL BOWEL NUTRITION STATUS   (None)  (None) Continent Diet (Regular)  AMBULATORY STATUS COMMUNICATION OF NEEDS Skin   Limited Assist Verbally Surgical wounds                       Personal Care Assistance Level of Assistance  Bathing, Dressing, Feeding Bathing Assistance: Limited assistance Feeding assistance: Independent Dressing Assistance:  Limited assistance     Functional Limitations Info  Sight, Hearing Sight Info: Adequate Hearing Info: Adequate Speech Info: Adequate    SPECIAL CARE FACTORS FREQUENCY  PT (By licensed PT)     PT Frequency: 3              Contractures Contractures Info: Not present    Additional Factors Info  Code Status, Allergies Code Status Info: Full Allergies Info: Prochlorperazine Edisylate, Sulfamethoxazole, Doxycycline           Current Medications (02/06/2016):  This is the current hospital active medication list Current Facility-Administered Medications  Medication Dose Route Frequency Provider Last Rate Last Dose  . acetaminophen (TYLENOL) tablet 650 mg  650 mg Oral Q6H PRN Naiping Ephriam Jenkins, MD       Or  . acetaminophen (TYLENOL) suppository 650 mg  650 mg Rectal Q6H PRN Leandrew Koyanagi, MD      . albuterol (PROVENTIL) (2.5 MG/3ML) 0.083% nebulizer solution 2.5 mg  2.5 mg Inhalation Q6H PRN Leandrew Koyanagi, MD   2.5 mg at 02/05/16 2050  . aspirin EC tablet 325 mg  325 mg Oral BID Leandrew Koyanagi, MD   325 mg at 02/06/16 0944  . atorvastatin (LIPITOR) tablet 10 mg  10 mg Oral q morning - 10a Naiping Ephriam Jenkins, MD   10 mg at 02/06/16 0944  . calcium-vitamin D (OSCAL WITH D) 500-200 MG-UNIT per tablet 1 tablet  1 tablet Oral q morning - 10a Naiping  Ephriam Jenkins, MD   1 tablet at 02/06/16 0944  . cyanocobalamin tablet 2,500 mcg  2,500 mcg Oral q morning - 10a Leandrew Koyanagi, MD   2,500 mcg at 02/06/16 0944  . docusate sodium (COLACE) capsule 100 mg  100 mg Oral QHS Naiping Ephriam Jenkins, MD      . folic acid (FOLVITE) tablet 1 mg  1 mg Oral q morning - 10a Naiping Ephriam Jenkins, MD   1 mg at 02/06/16 0944  . irbesartan (AVAPRO) tablet 150 mg  150 mg Oral Daily Romona Curls, RPH   150 mg at 02/06/16 0944   And  . hydrochlorothiazide (MICROZIDE) capsule 12.5 mg  12.5 mg Oral Daily Romona Curls, RPH   12.5 mg at 02/06/16 0944  . HYDROcodone-acetaminophen (NORCO/VICODIN) 5-325 MG per tablet 1 tablet  1 tablet Oral Q6H PRN  Leandrew Koyanagi, MD      . loratadine (CLARITIN) tablet 10 mg  10 mg Oral QHS Leandrew Koyanagi, MD   10 mg at 02/05/16 2216  . methocarbamol (ROBAXIN) tablet 500 mg  500 mg Oral Q6H PRN Leandrew Koyanagi, MD   500 mg at 02/05/16 2226   Or  . methocarbamol (ROBAXIN) 500 mg in dextrose 5 % 50 mL IVPB  500 mg Intravenous Q6H PRN Naiping Ephriam Jenkins, MD      . methotrexate (RHEUMATREX) tablet 15 mg  15 mg Oral Weekly Naiping Ephriam Jenkins, MD      . morphine 2 MG/ML injection 1 mg  1 mg Intravenous Q2H PRN Naiping Ephriam Jenkins, MD      . multivitamin with minerals tablet 1 tablet  1 tablet Oral q morning - 10a Naiping Ephriam Jenkins, MD   1 tablet at 02/06/16 0944  . ondansetron (ZOFRAN) tablet 4-8 mg  4-8 mg Oral Q8H PRN Naiping Ephriam Jenkins, MD      . oxyCODONE (Oxy IR/ROXICODONE) immediate release tablet 5-15 mg  5-15 mg Oral Q4H PRN Leandrew Koyanagi, MD   10 mg at 02/06/16 0749  . polyethylene glycol (MIRALAX / GLYCOLAX) packet 17 g  17 g Oral Daily PRN Naiping Ephriam Jenkins, MD      . predniSONE (STERAPRED UNI-PAK 21 TAB) tablet 10 mg  10 mg Oral PC lunch Naiping Ephriam Jenkins, MD      . predniSONE (STERAPRED UNI-PAK 21 TAB) tablet 10 mg  10 mg Oral 3 x daily with food Leandrew Koyanagi, MD   10 mg at 02/06/16 0800  . [START ON 02/07/2016] predniSONE (STERAPRED UNI-PAK 21 TAB) tablet 10 mg  10 mg Oral 4X daily taper Naiping Ephriam Jenkins, MD      . predniSONE (STERAPRED UNI-PAK 21 TAB) tablet 20 mg  20 mg Oral Nightly Naiping Ephriam Jenkins, MD      . senna-docusate (Senokot-S) tablet 1 tablet  1 tablet Oral QHS PRN Leandrew Koyanagi, MD      . traMADol Veatrice Bourbon) tablet 50 mg  50 mg Oral Q6H PRN Leandrew Koyanagi, MD   50 mg at 02/05/16 2225     Discharge Medications: Please see discharge summary for a list of discharge medications.  Relevant Imaging Results:  Relevant Lab Results:   Additional Information  (SS# [id number])  Candie Chroman, LCSW

## 2016-02-06 NOTE — Clinical Social Work Note (Signed)
Clinical Social Work Assessment  Patient Details  Name: Casey Wilkinson MRN: [id number] Date of Birth: 06-28-1941  Date of referral:  02/06/16               Reason for consult:  Facility Placement, Discharge Planning                Permission sought to share information with:  Facility Sport and exercise psychologist, Family Supports Permission granted to share information::  Yes, Verbal Permission Granted  Name::     Casey Wilkinson  Agency::  SNF's  Relationship::  Husband  Contact Information:  [phone]  Housing/Transportation Living arrangements for the past 2 months:  Single Family Home Source of Information:  Patient Patient Interpreter Needed:  None Criminal Activity/Legal Involvement Pertinent to Current Situation/Hospitalization:  No - Comment as needed Significant Relationships:  Adult Children, Spouse Lives with:  Spouse Do you feel safe going back to the place where you live?  Yes Need for family participation in patient care:  Yes (Comment)  Care giving concerns:  Patient in need of SNF placement. Her husband is having a "flare up" from back surgery in January and he is unable to assist her at home.   Social Worker assessment / plan:  CSW met with patient. No family at bedside. CSW introduced role and explained that discharge planning would be discussed. Confirmed that patient prefers to go to [facility]. Patient was concerned about not having required 3 day stay before going to SNF. Explained that patient has [facility] services so the 3 day stay is overrided. Patient was happy about this and said she would call her daughter that lives in [place] because she was trying to find other options. CSW informed patient that she would be discharging today.  Employment status:  Retired Forensic scientist:  [employer] PT Recommendations:  [place] / Referral to community resources:  [place]  Patient/Family's Response to care:  Patient was  appreciative for CSW consultation.   Patient/Family's Understanding of and Emotional Response to Diagnosis, Current Treatment, and Prognosis:  Patient was happy about being able to discharge to SNF today. Family is reportedly supportive and knowledgeable about care.  Emotional Assessment Appearance:  Appears stated age Attitude/Demeanor/Rapport:   (Pleasant, Happy) Affect (typically observed):  Calm, Happy Orientation:  Oriented to Self, Oriented to Place, Oriented to  Time, Oriented to Situation Alcohol / Substance use:  Never Used Psych involvement (Current and /or in the community):  No (Comment)  Discharge Needs  Concerns to be addressed:  Care Coordination, Other (Comment Required (Patient's husband unable to assist her at home at this time.) Readmission within the last 30 days:  Yes Current discharge risk:  None Barriers to Discharge:  No Barriers Identified   Candie Chroman, LCSW 02/06/2016, 11:52 AM

## 2016-02-07 DIAGNOSIS — J449 Chronic obstructive pulmonary disease, unspecified: Secondary | ICD-10-CM | POA: Diagnosis not present

## 2016-02-07 DIAGNOSIS — Z9981 Dependence on supplemental oxygen: Secondary | ICD-10-CM | POA: Diagnosis not present

## 2016-02-07 DIAGNOSIS — L409 Psoriasis, unspecified: Secondary | ICD-10-CM | POA: Diagnosis not present

## 2016-02-07 DIAGNOSIS — M329 Systemic lupus erythematosus, unspecified: Secondary | ICD-10-CM | POA: Diagnosis not present

## 2016-02-07 DIAGNOSIS — I10 Essential (primary) hypertension: Secondary | ICD-10-CM | POA: Diagnosis not present

## 2016-02-07 DIAGNOSIS — Z09 Encounter for follow-up examination after completed treatment for conditions other than malignant neoplasm: Secondary | ICD-10-CM | POA: Diagnosis not present

## 2016-02-07 DIAGNOSIS — Z471 Aftercare following joint replacement surgery: Secondary | ICD-10-CM | POA: Diagnosis not present

## 2016-02-07 DIAGNOSIS — Z96652 Presence of left artificial knee joint: Secondary | ICD-10-CM | POA: Diagnosis not present

## 2016-02-07 DIAGNOSIS — D649 Anemia, unspecified: Secondary | ICD-10-CM | POA: Diagnosis not present

## 2016-02-07 MED ORDER — PREDNISONE 10 MG (21) PO TBPK: 10.0000 mg | ORAL_TABLET | Freq: Four times a day (QID) | ORAL | Status: DC

## 2016-02-07 NOTE — Progress Notes (Signed)
   Subjective:  Patient is doing well. Started prednisone last night  Objective:   VITALS:   Filed Vitals:   02/05/16 2122 02/06/16 0300 02/06/16 0628 02/07/16 0529  BP: 120/46 128/54 118/38 139/62  Pulse: 101 83 82 82  Temp: 98.9 F (37.2 C) 97.8 F (36.6 C) 97.9 F (36.6 C) 97.6 F (36.4 C)  TempSrc: Oral Oral Oral Oral  Resp: '18 18 18 19  '$ Height:      Weight:      SpO2: 91% 97% 98% 98%    NAD AFVSS Incision c/d/i Psoriasis stable Off oxygen nonlabored breathing   Lab Results  Component Value Date   WBC 8.6 02/02/2016   HGB 10.3* 02/02/2016   HCT 31.8* 02/02/2016   MCV 93.0 02/02/2016   PLT 104* 02/02/2016     Assessment/Plan:    s/p L TKA  - prednisone taper for psoriasis flare up - oxygen weaned - dressings changed - insurance has denied SNF stay, patient will make arrangements for home - may dc today - continue prednisone taper  Marianna Payment 02/07/2016, 7:23 AM [phone]

## 2016-02-07 NOTE — Consult Note (Signed)
   [facility] CM Inpatient Consult   02/07/2016  ELAINE ROANHORSE 01/29/1941 [id number] Referral received regarding patient's benefit on yesterday with social workers with the [facility] Waiver for skilled nursing facility. Chart review indicated the waiver is currently not in effect yet. Patient was assessed for post hospital follow up.    Patient admitted under observation with ankle fracture.  HX of COPD.  Patient evaluated for community based chronic disease management services with [facility] as a benefit of patient's [employer]. Spoke with patient at bedside to explain [place] [facility] services.  Patient endorses Dr. Shon Baton as her primary care provider. Patient states she has minimal support at home with her husband having his back issues.  She states her daughter is able to spend the night with her tonight while her husband has his follow up appointment today.   Consent form signed.    Patient will receive post hospital discharge call and will be evaluated for monthly home visits for assessments and disease process education.  Left contact information and [facility] literature at bedside. Made Inpatient Case Manager aware that [place] [facility] following. Of note, [facility] Care Management services does not replace or interfere with any services that are arranged by inpatient case management or social work.  For additional questions or referrals please contact:   Natividad Brood, RN BSN [place] Liaison  [phone] business mobile phone Toll free office [phone]

## 2016-02-07 NOTE — Discharge Summary (Signed)
Physician Discharge Summary      Patient ID: Casey Wilkinson MRN: [id number] DOB/AGE: 1941/06/19 75 y.o.  Admit date: 02/05/2016 Discharge date: 02/07/2016  Admission Diagnoses:  <principal problem not specified>  Discharge Diagnoses:  Active Problems:   Total knee replacement status   Past Medical History  Diagnosis Date  . Discoid lupus erythematosus   . Hypertension   . COPD (chronic obstructive pulmonary disease) ([place])   . Anemia   . Colitis April 2016  . Diverticulitis April 2016    and Colitis  . Psoriatic arthritis ([place])   . Carotid artery stenosis     right side followed by [facility]- 65%  . Shortness of breath dyspnea     with exertion  . History of blood transfusion 01/2015, 06/2015    colectomy  . Colon cancer ([place])   . Squamous cell carcinoma, leg   . Arthritis     "all over"    Surgeries:  on * No surgery found *   Consultants (if any):    Discharged Condition: Improved  Hospital Course: Casey Wilkinson is an 75 y.o. female who was admitted 02/05/2016 with a diagnosis of <principal problem not specified> and went to the operating room on * No surgery found * and underwent the above named procedures.    She was given perioperative antibiotics:  Anti-infectives    None    .  She was given sequential compression devices, early ambulation, and aspirin for DVT prophylaxis.  She benefited maximally from the hospital stay and there were no complications.    Recent vital signs:  Filed Vitals:   02/06/16 0628 02/07/16 0529  BP: 118/38 139/62  Pulse: 82 82  Temp: 97.9 F (36.6 C) 97.6 F (36.4 C)  Resp: 18 19    Recent laboratory studies:  Lab Results  Component Value Date   HGB 10.3* 02/02/2016   HGB 7.7* 02/02/2016   HGB 8.1* 02/01/2016   Lab Results  Component Value Date   WBC 8.6 02/02/2016   PLT 104* 02/02/2016   Lab Results  Component Value Date   INR 1.09 01/19/2016   Lab Results  Component Value Date   NA 136 02/01/2016   K 3.7  02/01/2016   CL 104 02/01/2016   CO2 25 02/01/2016   BUN 10 02/01/2016   CREATININE 0.73 02/01/2016   GLUCOSE 112* 02/01/2016    Discharge Medications:     Medication List    ASK your doctor about these medications        ABC PLUS SENIOR Tabs  Take 1 tablet by mouth every morning.     albuterol 108 (90 Base) MCG/ACT inhaler  Commonly known as:  PROVENTIL HFA;VENTOLIN HFA  Inhale 2 puffs into the lungs every 6 (six) hours as needed for wheezing or shortness of breath.     aspirin EC 325 MG tablet  Take 1 tablet (325 mg total) by mouth 2 (two) times daily.     atorvastatin 10 MG tablet  Commonly known as:  LIPITOR  Take 1 tablet by mouth every morning.     Calcium Carbonate-Vitamin D 600-400 MG-UNIT tablet  Take 1 tablet by mouth every morning.     docusate sodium 100 MG capsule  Commonly known as:  COLACE  Take 100 mg by mouth at bedtime.     folic acid 1 MG tablet  Commonly known as:  FOLVITE  Take 1 mg by mouth every morning.     HYDROcodone-acetaminophen 5-325 MG tablet  Commonly known as:  NORCO/VICODIN  Take 1 tablet by mouth every 6 (six) hours as needed for moderate pain.     Loratadine 5 MG/5ML Soln  Take 10 mLs by mouth at bedtime.     methocarbamol 750 MG tablet  Commonly known as:  ROBAXIN  Take 1 tablet (750 mg total) by mouth 2 (two) times daily as needed for muscle spasms.     methotrexate 2.5 MG tablet  Commonly known as:  RHEUMATREX  Take 15 mg by mouth once a week. Wednesdays     ondansetron 4 MG tablet  Commonly known as:  ZOFRAN  Take 1-2 tablets (4-8 mg total) by mouth every 8 (eight) hours as needed for nausea or vomiting.     oxyCODONE 5 MG immediate release tablet  Commonly known as:  Oxy IR/ROXICODONE  Take 1-3 tablets (5-15 mg total) by mouth every 4 (four) hours as needed.     oxyCODONE 10 mg 12 hr tablet  Commonly known as:  OXYCONTIN  Take 1 tablet (10 mg total) by mouth every 12 (twelve) hours.     polyethylene glycol  packet  Commonly known as:  MIRALAX / GLYCOLAX  Take 17 g by mouth daily as needed for mild constipation.     Potassium Gluconate 595 MG Caps  Take 1 capsule by mouth at bedtime.     Tiotropium Bromide-Olodaterol 2.5-2.5 MCG/ACT Aers  Commonly known as:  STIOLTO RESPIMAT  Inhale 2 puffs into the lungs daily.     valsartan-hydrochlorothiazide 160-12.5 MG tablet  Commonly known as:  DIOVAN-HCT  Take 1 tablet by mouth every morning.     vitamin B-12 500 MCG tablet  Commonly known as:  CYANOCOBALAMIN  Take 2,500 mcg by mouth every morning.        Diagnostic Studies: Dg Knee Left Port  01/31/2016  CLINICAL DATA:  Left total knee replacement. EXAM: PORTABLE LEFT KNEE - 1-2 VIEW COMPARISON:  None. FINDINGS: Left total knee arthroplasty. Joint air and fluid are present. No acute osseous abnormality. IMPRESSION: Left total knee arthroplasty with expected postoperative findings. Electronically Signed   By: Lorin Picket M.D.   On: 01/31/2016 15:02    Disposition: 06-Home-Health Care Svc       Signed: Marianna Payment 02/07/2016, 7:25 AM

## 2016-02-07 NOTE — Progress Notes (Signed)
Casey Wilkinson to be D/C'd  per MD order. Discussed with the patient and all questions fully answered.  VSS, Skin clean, dry and intact without evidence of skin break down, no evidence of skin tears noted.  IV catheter discontinued intact. Site without signs and symptoms of complications. Dressing and pressure applied.  An After Visit Summary was printed and given to the patient. Patient received prescription.  D/c education completed with patient/family including follow up instructions, medication list, d/c activities limitations if indicated, with other d/c instructions as indicated by MD - patient able to verbalize understanding, all questions fully answered.   Patient instructed to return to ED, call 911, or call MD for any changes in condition.   Patient to be escorted via [place], and D/C home via private auto.

## 2016-02-08 ENCOUNTER — Other Ambulatory Visit: Payer: Self-pay

## 2016-02-08 ENCOUNTER — Encounter: Payer: Self-pay | Admitting: *Deleted

## 2016-02-08 ENCOUNTER — Other Ambulatory Visit: Payer: Self-pay | Admitting: *Deleted

## 2016-02-08 DIAGNOSIS — I10 Essential (primary) hypertension: Secondary | ICD-10-CM | POA: Diagnosis not present

## 2016-02-08 DIAGNOSIS — Z9981 Dependence on supplemental oxygen: Secondary | ICD-10-CM | POA: Diagnosis not present

## 2016-02-08 DIAGNOSIS — Z471 Aftercare following joint replacement surgery: Secondary | ICD-10-CM | POA: Diagnosis not present

## 2016-02-08 DIAGNOSIS — Z96652 Presence of left artificial knee joint: Secondary | ICD-10-CM | POA: Diagnosis not present

## 2016-02-08 DIAGNOSIS — J449 Chronic obstructive pulmonary disease, unspecified: Secondary | ICD-10-CM | POA: Diagnosis not present

## 2016-02-08 DIAGNOSIS — M329 Systemic lupus erythematosus, unspecified: Secondary | ICD-10-CM | POA: Diagnosis not present

## 2016-02-08 NOTE — Patient Outreach (Signed)
[facility]) Care Management [place] Telephone outreach, Transition of Care, day 1 02/08/2016  FLORENTINE DIEKMAN 1941/02/08 [id number]  Successful telephone outreach to Jerilynn Som, 75 y/o female, followed by [facility] for transition of Care after 2 recent IP hospital visits April 12-14, 2017 for (L) total knee replacement, and then again on April 17-19, 2017, when patient experienced a psoriasis flare and required acute hospitalization.    Mrs. Ryce had provided written consent while hospitalized for [facility] services upon discharge, however, today, pt. reported that she has [facility] RN and PT in place through [facility], and flatly refused additional [facility] services, stating that she "thought [facility] was home health."  I thoroughly explained [facility] services to the patient but she said "I don't want to waste your time; I have everything I need and do not really want to participate in this program."   I let the patient know that I would place a letter in the mail to her, should she reconsider and provided her with my direct telephone contact information.  Oneta Rack, RN, BSN, [employer] [facility] Care Management  [phone]

## 2016-02-09 DIAGNOSIS — Z471 Aftercare following joint replacement surgery: Secondary | ICD-10-CM | POA: Diagnosis not present

## 2016-02-09 DIAGNOSIS — M329 Systemic lupus erythematosus, unspecified: Secondary | ICD-10-CM | POA: Diagnosis not present

## 2016-02-09 DIAGNOSIS — Z9981 Dependence on supplemental oxygen: Secondary | ICD-10-CM | POA: Diagnosis not present

## 2016-02-09 DIAGNOSIS — I10 Essential (primary) hypertension: Secondary | ICD-10-CM | POA: Diagnosis not present

## 2016-02-09 DIAGNOSIS — J449 Chronic obstructive pulmonary disease, unspecified: Secondary | ICD-10-CM | POA: Diagnosis not present

## 2016-02-09 DIAGNOSIS — Z96652 Presence of left artificial knee joint: Secondary | ICD-10-CM | POA: Diagnosis not present

## 2016-02-12 DIAGNOSIS — I10 Essential (primary) hypertension: Secondary | ICD-10-CM | POA: Diagnosis not present

## 2016-02-12 DIAGNOSIS — J449 Chronic obstructive pulmonary disease, unspecified: Secondary | ICD-10-CM | POA: Diagnosis not present

## 2016-02-12 DIAGNOSIS — Z96652 Presence of left artificial knee joint: Secondary | ICD-10-CM | POA: Diagnosis not present

## 2016-02-12 DIAGNOSIS — Z9981 Dependence on supplemental oxygen: Secondary | ICD-10-CM | POA: Diagnosis not present

## 2016-02-12 DIAGNOSIS — M329 Systemic lupus erythematosus, unspecified: Secondary | ICD-10-CM | POA: Diagnosis not present

## 2016-02-12 DIAGNOSIS — Z471 Aftercare following joint replacement surgery: Secondary | ICD-10-CM | POA: Diagnosis not present

## 2016-02-14 DIAGNOSIS — J449 Chronic obstructive pulmonary disease, unspecified: Secondary | ICD-10-CM | POA: Diagnosis not present

## 2016-02-14 DIAGNOSIS — I10 Essential (primary) hypertension: Secondary | ICD-10-CM | POA: Diagnosis not present

## 2016-02-14 DIAGNOSIS — Z9981 Dependence on supplemental oxygen: Secondary | ICD-10-CM | POA: Diagnosis not present

## 2016-02-14 DIAGNOSIS — M329 Systemic lupus erythematosus, unspecified: Secondary | ICD-10-CM | POA: Diagnosis not present

## 2016-02-14 DIAGNOSIS — Z96652 Presence of left artificial knee joint: Secondary | ICD-10-CM | POA: Diagnosis not present

## 2016-02-14 DIAGNOSIS — Z471 Aftercare following joint replacement surgery: Secondary | ICD-10-CM | POA: Diagnosis not present

## 2016-02-16 ENCOUNTER — Telehealth: Payer: Self-pay | Admitting: Emergency Medicine

## 2016-02-16 DIAGNOSIS — Z471 Aftercare following joint replacement surgery: Secondary | ICD-10-CM | POA: Diagnosis not present

## 2016-02-16 DIAGNOSIS — Z96652 Presence of left artificial knee joint: Secondary | ICD-10-CM | POA: Diagnosis not present

## 2016-02-16 DIAGNOSIS — M329 Systemic lupus erythematosus, unspecified: Secondary | ICD-10-CM | POA: Diagnosis not present

## 2016-02-16 DIAGNOSIS — I10 Essential (primary) hypertension: Secondary | ICD-10-CM | POA: Diagnosis not present

## 2016-02-16 DIAGNOSIS — J449 Chronic obstructive pulmonary disease, unspecified: Secondary | ICD-10-CM | POA: Diagnosis not present

## 2016-02-16 DIAGNOSIS — Z9981 Dependence on supplemental oxygen: Secondary | ICD-10-CM | POA: Diagnosis not present

## 2016-02-16 NOTE — Telephone Encounter (Signed)
LVM for pt to return call

## 2016-02-19 DIAGNOSIS — Z96652 Presence of left artificial knee joint: Secondary | ICD-10-CM | POA: Diagnosis not present

## 2016-02-19 DIAGNOSIS — I10 Essential (primary) hypertension: Secondary | ICD-10-CM | POA: Diagnosis not present

## 2016-02-19 DIAGNOSIS — Z471 Aftercare following joint replacement surgery: Secondary | ICD-10-CM | POA: Diagnosis not present

## 2016-02-19 DIAGNOSIS — J449 Chronic obstructive pulmonary disease, unspecified: Secondary | ICD-10-CM | POA: Diagnosis not present

## 2016-02-19 DIAGNOSIS — Z9981 Dependence on supplemental oxygen: Secondary | ICD-10-CM | POA: Diagnosis not present

## 2016-02-19 DIAGNOSIS — M329 Systemic lupus erythematosus, unspecified: Secondary | ICD-10-CM | POA: Diagnosis not present

## 2016-02-19 NOTE — Telephone Encounter (Signed)
Spoke with pt, states she was d/c'ed with  24/7 02 on 01/31/16, but states she does not need it and she does not use it. Pt states she was initially sent home with 02 after knee replacement, and when she went back to hospital after sx for some complications, a nurse in the hospital d/c'ed her 02, but she still has the home setup.   Pt is requesting we place an order to pick this up.  Pt has this through [facility].    RB please advise if ok to place order to d/c all 02.  Thanks!

## 2016-02-21 DIAGNOSIS — J449 Chronic obstructive pulmonary disease, unspecified: Secondary | ICD-10-CM | POA: Diagnosis not present

## 2016-02-21 DIAGNOSIS — Z96652 Presence of left artificial knee joint: Secondary | ICD-10-CM | POA: Diagnosis not present

## 2016-02-21 DIAGNOSIS — Z9981 Dependence on supplemental oxygen: Secondary | ICD-10-CM | POA: Diagnosis not present

## 2016-02-21 DIAGNOSIS — Z471 Aftercare following joint replacement surgery: Secondary | ICD-10-CM | POA: Diagnosis not present

## 2016-02-21 DIAGNOSIS — I10 Essential (primary) hypertension: Secondary | ICD-10-CM | POA: Diagnosis not present

## 2016-02-21 DIAGNOSIS — M329 Systemic lupus erythematosus, unspecified: Secondary | ICD-10-CM | POA: Diagnosis not present

## 2016-02-22 NOTE — Telephone Encounter (Signed)
RB please advise

## 2016-02-22 NOTE — Telephone Encounter (Signed)
Spoke with pt, scheduled for qualifying walk on Monday.  Nothing further needed.

## 2016-02-22 NOTE — Telephone Encounter (Signed)
Tell her there is no way for me to know if she needs it at this time - she would need to have a walking oximetry on RA to determine whether she qualifies for o2. If she is willing to do a walking oximetry to see if she still needs it then order this. If she is convinced that she will not use it no matter what the testing says, then document that fact and d/c the oxygen.

## 2016-02-23 DIAGNOSIS — Z96652 Presence of left artificial knee joint: Secondary | ICD-10-CM | POA: Diagnosis not present

## 2016-02-23 DIAGNOSIS — M329 Systemic lupus erythematosus, unspecified: Secondary | ICD-10-CM | POA: Diagnosis not present

## 2016-02-23 DIAGNOSIS — J449 Chronic obstructive pulmonary disease, unspecified: Secondary | ICD-10-CM | POA: Diagnosis not present

## 2016-02-23 DIAGNOSIS — Z471 Aftercare following joint replacement surgery: Secondary | ICD-10-CM | POA: Diagnosis not present

## 2016-02-23 DIAGNOSIS — Z9981 Dependence on supplemental oxygen: Secondary | ICD-10-CM | POA: Diagnosis not present

## 2016-02-23 DIAGNOSIS — I10 Essential (primary) hypertension: Secondary | ICD-10-CM | POA: Diagnosis not present

## 2016-02-26 ENCOUNTER — Ambulatory Visit (INDEPENDENT_AMBULATORY_CARE_PROVIDER_SITE_OTHER): Payer: Medicare Other | Admitting: Emergency Medicine

## 2016-02-26 ENCOUNTER — Telehealth: Payer: Self-pay | Admitting: Emergency Medicine

## 2016-02-26 DIAGNOSIS — Z471 Aftercare following joint replacement surgery: Secondary | ICD-10-CM | POA: Diagnosis not present

## 2016-02-26 DIAGNOSIS — J449 Chronic obstructive pulmonary disease, unspecified: Secondary | ICD-10-CM

## 2016-02-26 DIAGNOSIS — Z9981 Dependence on supplemental oxygen: Secondary | ICD-10-CM | POA: Diagnosis not present

## 2016-02-26 DIAGNOSIS — M329 Systemic lupus erythematosus, unspecified: Secondary | ICD-10-CM | POA: Diagnosis not present

## 2016-02-26 DIAGNOSIS — I10 Essential (primary) hypertension: Secondary | ICD-10-CM | POA: Diagnosis not present

## 2016-02-26 DIAGNOSIS — Z96652 Presence of left artificial knee joint: Secondary | ICD-10-CM | POA: Diagnosis not present

## 2016-02-26 NOTE — Telephone Encounter (Signed)
Patient called back, her DME is [facility].

## 2016-02-26 NOTE — Telephone Encounter (Signed)
Noted.  Order to D/C oxygen sent to [facility].  Nothing further needed.

## 2016-02-26 NOTE — Telephone Encounter (Signed)
Patient came in for O2 qualifying walk today.   Need name of DME to send D/C order. Called patient, she doesn't remember the name of the DME company that she gets her O2 from and will call back with that information when she gets home Awaiting call back from patient.

## 2016-02-28 DIAGNOSIS — Z9981 Dependence on supplemental oxygen: Secondary | ICD-10-CM | POA: Diagnosis not present

## 2016-02-28 DIAGNOSIS — I10 Essential (primary) hypertension: Secondary | ICD-10-CM | POA: Diagnosis not present

## 2016-02-28 DIAGNOSIS — Z471 Aftercare following joint replacement surgery: Secondary | ICD-10-CM | POA: Diagnosis not present

## 2016-02-28 DIAGNOSIS — J449 Chronic obstructive pulmonary disease, unspecified: Secondary | ICD-10-CM | POA: Diagnosis not present

## 2016-02-28 DIAGNOSIS — M329 Systemic lupus erythematosus, unspecified: Secondary | ICD-10-CM | POA: Diagnosis not present

## 2016-02-28 DIAGNOSIS — Z96652 Presence of left artificial knee joint: Secondary | ICD-10-CM | POA: Diagnosis not present

## 2016-02-29 ENCOUNTER — Encounter (HOSPITAL_COMMUNITY): Payer: Medicare Other

## 2016-02-29 ENCOUNTER — Ambulatory Visit: Payer: Medicare Other | Admitting: Family

## 2016-02-29 DIAGNOSIS — Z471 Aftercare following joint replacement surgery: Secondary | ICD-10-CM | POA: Diagnosis not present

## 2016-02-29 DIAGNOSIS — Z96652 Presence of left artificial knee joint: Secondary | ICD-10-CM | POA: Diagnosis not present

## 2016-02-29 DIAGNOSIS — J449 Chronic obstructive pulmonary disease, unspecified: Secondary | ICD-10-CM | POA: Diagnosis not present

## 2016-02-29 DIAGNOSIS — I10 Essential (primary) hypertension: Secondary | ICD-10-CM | POA: Diagnosis not present

## 2016-02-29 DIAGNOSIS — M329 Systemic lupus erythematosus, unspecified: Secondary | ICD-10-CM | POA: Diagnosis not present

## 2016-02-29 DIAGNOSIS — Z9981 Dependence on supplemental oxygen: Secondary | ICD-10-CM | POA: Diagnosis not present

## 2016-03-04 DIAGNOSIS — Z471 Aftercare following joint replacement surgery: Secondary | ICD-10-CM | POA: Diagnosis not present

## 2016-03-04 DIAGNOSIS — M329 Systemic lupus erythematosus, unspecified: Secondary | ICD-10-CM | POA: Diagnosis not present

## 2016-03-04 DIAGNOSIS — Z9981 Dependence on supplemental oxygen: Secondary | ICD-10-CM | POA: Diagnosis not present

## 2016-03-04 DIAGNOSIS — Z96652 Presence of left artificial knee joint: Secondary | ICD-10-CM | POA: Diagnosis not present

## 2016-03-04 DIAGNOSIS — I10 Essential (primary) hypertension: Secondary | ICD-10-CM | POA: Diagnosis not present

## 2016-03-04 DIAGNOSIS — J449 Chronic obstructive pulmonary disease, unspecified: Secondary | ICD-10-CM | POA: Diagnosis not present

## 2016-03-06 DIAGNOSIS — J449 Chronic obstructive pulmonary disease, unspecified: Secondary | ICD-10-CM | POA: Diagnosis not present

## 2016-03-06 DIAGNOSIS — M329 Systemic lupus erythematosus, unspecified: Secondary | ICD-10-CM | POA: Diagnosis not present

## 2016-03-06 DIAGNOSIS — Z9981 Dependence on supplemental oxygen: Secondary | ICD-10-CM | POA: Diagnosis not present

## 2016-03-06 DIAGNOSIS — Z471 Aftercare following joint replacement surgery: Secondary | ICD-10-CM | POA: Diagnosis not present

## 2016-03-06 DIAGNOSIS — I10 Essential (primary) hypertension: Secondary | ICD-10-CM | POA: Diagnosis not present

## 2016-03-06 DIAGNOSIS — Z96652 Presence of left artificial knee joint: Secondary | ICD-10-CM | POA: Diagnosis not present

## 2016-03-08 DIAGNOSIS — Z471 Aftercare following joint replacement surgery: Secondary | ICD-10-CM | POA: Diagnosis not present

## 2016-03-08 DIAGNOSIS — J449 Chronic obstructive pulmonary disease, unspecified: Secondary | ICD-10-CM | POA: Diagnosis not present

## 2016-03-08 DIAGNOSIS — Z9981 Dependence on supplemental oxygen: Secondary | ICD-10-CM | POA: Diagnosis not present

## 2016-03-08 DIAGNOSIS — Z96652 Presence of left artificial knee joint: Secondary | ICD-10-CM | POA: Diagnosis not present

## 2016-03-08 DIAGNOSIS — M329 Systemic lupus erythematosus, unspecified: Secondary | ICD-10-CM | POA: Diagnosis not present

## 2016-03-08 DIAGNOSIS — I10 Essential (primary) hypertension: Secondary | ICD-10-CM | POA: Diagnosis not present

## 2016-03-11 DIAGNOSIS — I10 Essential (primary) hypertension: Secondary | ICD-10-CM | POA: Diagnosis not present

## 2016-03-11 DIAGNOSIS — Z9981 Dependence on supplemental oxygen: Secondary | ICD-10-CM | POA: Diagnosis not present

## 2016-03-11 DIAGNOSIS — Z96652 Presence of left artificial knee joint: Secondary | ICD-10-CM | POA: Diagnosis not present

## 2016-03-11 DIAGNOSIS — J449 Chronic obstructive pulmonary disease, unspecified: Secondary | ICD-10-CM | POA: Diagnosis not present

## 2016-03-11 DIAGNOSIS — Z471 Aftercare following joint replacement surgery: Secondary | ICD-10-CM | POA: Diagnosis not present

## 2016-03-11 DIAGNOSIS — M329 Systemic lupus erythematosus, unspecified: Secondary | ICD-10-CM | POA: Diagnosis not present

## 2016-03-12 DIAGNOSIS — M25562 Pain in left knee: Secondary | ICD-10-CM | POA: Diagnosis not present

## 2016-03-12 DIAGNOSIS — M1712 Unilateral primary osteoarthritis, left knee: Secondary | ICD-10-CM | POA: Diagnosis not present

## 2016-03-13 DIAGNOSIS — M329 Systemic lupus erythematosus, unspecified: Secondary | ICD-10-CM | POA: Diagnosis not present

## 2016-03-13 DIAGNOSIS — I10 Essential (primary) hypertension: Secondary | ICD-10-CM | POA: Diagnosis not present

## 2016-03-13 DIAGNOSIS — Z471 Aftercare following joint replacement surgery: Secondary | ICD-10-CM | POA: Diagnosis not present

## 2016-03-13 DIAGNOSIS — Z96652 Presence of left artificial knee joint: Secondary | ICD-10-CM | POA: Diagnosis not present

## 2016-03-13 DIAGNOSIS — Z9981 Dependence on supplemental oxygen: Secondary | ICD-10-CM | POA: Diagnosis not present

## 2016-03-13 DIAGNOSIS — J449 Chronic obstructive pulmonary disease, unspecified: Secondary | ICD-10-CM | POA: Diagnosis not present

## 2016-03-15 DIAGNOSIS — M329 Systemic lupus erythematosus, unspecified: Secondary | ICD-10-CM | POA: Diagnosis not present

## 2016-03-15 DIAGNOSIS — Z471 Aftercare following joint replacement surgery: Secondary | ICD-10-CM | POA: Diagnosis not present

## 2016-03-15 DIAGNOSIS — Z9981 Dependence on supplemental oxygen: Secondary | ICD-10-CM | POA: Diagnosis not present

## 2016-03-15 DIAGNOSIS — I10 Essential (primary) hypertension: Secondary | ICD-10-CM | POA: Diagnosis not present

## 2016-03-15 DIAGNOSIS — Z96652 Presence of left artificial knee joint: Secondary | ICD-10-CM | POA: Diagnosis not present

## 2016-03-15 DIAGNOSIS — J449 Chronic obstructive pulmonary disease, unspecified: Secondary | ICD-10-CM | POA: Diagnosis not present

## 2016-03-18 DIAGNOSIS — Z9981 Dependence on supplemental oxygen: Secondary | ICD-10-CM | POA: Diagnosis not present

## 2016-03-18 DIAGNOSIS — M329 Systemic lupus erythematosus, unspecified: Secondary | ICD-10-CM | POA: Diagnosis not present

## 2016-03-18 DIAGNOSIS — Z96652 Presence of left artificial knee joint: Secondary | ICD-10-CM | POA: Diagnosis not present

## 2016-03-18 DIAGNOSIS — Z471 Aftercare following joint replacement surgery: Secondary | ICD-10-CM | POA: Diagnosis not present

## 2016-03-18 DIAGNOSIS — J449 Chronic obstructive pulmonary disease, unspecified: Secondary | ICD-10-CM | POA: Diagnosis not present

## 2016-03-18 DIAGNOSIS — I10 Essential (primary) hypertension: Secondary | ICD-10-CM | POA: Diagnosis not present

## 2016-03-19 DIAGNOSIS — I10 Essential (primary) hypertension: Secondary | ICD-10-CM | POA: Diagnosis not present

## 2016-03-19 DIAGNOSIS — L400 Psoriasis vulgaris: Secondary | ICD-10-CM | POA: Diagnosis not present

## 2016-03-19 DIAGNOSIS — Z79899 Other long term (current) drug therapy: Secondary | ICD-10-CM | POA: Diagnosis not present

## 2016-03-19 DIAGNOSIS — J449 Chronic obstructive pulmonary disease, unspecified: Secondary | ICD-10-CM | POA: Diagnosis not present

## 2016-03-19 DIAGNOSIS — Z471 Aftercare following joint replacement surgery: Secondary | ICD-10-CM | POA: Diagnosis not present

## 2016-03-19 DIAGNOSIS — Z9981 Dependence on supplemental oxygen: Secondary | ICD-10-CM | POA: Diagnosis not present

## 2016-03-19 DIAGNOSIS — Z96652 Presence of left artificial knee joint: Secondary | ICD-10-CM | POA: Diagnosis not present

## 2016-03-19 DIAGNOSIS — L570 Actinic keratosis: Secondary | ICD-10-CM | POA: Diagnosis not present

## 2016-03-19 DIAGNOSIS — M329 Systemic lupus erythematosus, unspecified: Secondary | ICD-10-CM | POA: Diagnosis not present

## 2016-03-19 DIAGNOSIS — Z85828 Personal history of other malignant neoplasm of skin: Secondary | ICD-10-CM | POA: Diagnosis not present

## 2016-03-20 DIAGNOSIS — Z96652 Presence of left artificial knee joint: Secondary | ICD-10-CM | POA: Diagnosis not present

## 2016-03-20 DIAGNOSIS — I10 Essential (primary) hypertension: Secondary | ICD-10-CM | POA: Diagnosis not present

## 2016-03-20 DIAGNOSIS — M329 Systemic lupus erythematosus, unspecified: Secondary | ICD-10-CM | POA: Diagnosis not present

## 2016-03-20 DIAGNOSIS — J449 Chronic obstructive pulmonary disease, unspecified: Secondary | ICD-10-CM | POA: Diagnosis not present

## 2016-03-20 DIAGNOSIS — Z471 Aftercare following joint replacement surgery: Secondary | ICD-10-CM | POA: Diagnosis not present

## 2016-03-20 DIAGNOSIS — Z9981 Dependence on supplemental oxygen: Secondary | ICD-10-CM | POA: Diagnosis not present

## 2016-03-22 DIAGNOSIS — M329 Systemic lupus erythematosus, unspecified: Secondary | ICD-10-CM | POA: Diagnosis not present

## 2016-03-22 DIAGNOSIS — Z471 Aftercare following joint replacement surgery: Secondary | ICD-10-CM | POA: Diagnosis not present

## 2016-03-22 DIAGNOSIS — I10 Essential (primary) hypertension: Secondary | ICD-10-CM | POA: Diagnosis not present

## 2016-03-22 DIAGNOSIS — J449 Chronic obstructive pulmonary disease, unspecified: Secondary | ICD-10-CM | POA: Diagnosis not present

## 2016-03-22 DIAGNOSIS — Z9981 Dependence on supplemental oxygen: Secondary | ICD-10-CM | POA: Diagnosis not present

## 2016-03-22 DIAGNOSIS — Z96652 Presence of left artificial knee joint: Secondary | ICD-10-CM | POA: Diagnosis not present

## 2016-03-25 DIAGNOSIS — I10 Essential (primary) hypertension: Secondary | ICD-10-CM | POA: Diagnosis not present

## 2016-03-25 DIAGNOSIS — M329 Systemic lupus erythematosus, unspecified: Secondary | ICD-10-CM | POA: Diagnosis not present

## 2016-03-25 DIAGNOSIS — J449 Chronic obstructive pulmonary disease, unspecified: Secondary | ICD-10-CM | POA: Diagnosis not present

## 2016-03-25 DIAGNOSIS — Z471 Aftercare following joint replacement surgery: Secondary | ICD-10-CM | POA: Diagnosis not present

## 2016-03-25 DIAGNOSIS — Z96652 Presence of left artificial knee joint: Secondary | ICD-10-CM | POA: Diagnosis not present

## 2016-03-25 DIAGNOSIS — Z9981 Dependence on supplemental oxygen: Secondary | ICD-10-CM | POA: Diagnosis not present

## 2016-03-27 DIAGNOSIS — I10 Essential (primary) hypertension: Secondary | ICD-10-CM | POA: Diagnosis not present

## 2016-03-27 DIAGNOSIS — J449 Chronic obstructive pulmonary disease, unspecified: Secondary | ICD-10-CM | POA: Diagnosis not present

## 2016-03-27 DIAGNOSIS — Z471 Aftercare following joint replacement surgery: Secondary | ICD-10-CM | POA: Diagnosis not present

## 2016-03-27 DIAGNOSIS — Z96652 Presence of left artificial knee joint: Secondary | ICD-10-CM | POA: Diagnosis not present

## 2016-03-27 DIAGNOSIS — M329 Systemic lupus erythematosus, unspecified: Secondary | ICD-10-CM | POA: Diagnosis not present

## 2016-03-27 DIAGNOSIS — Z9981 Dependence on supplemental oxygen: Secondary | ICD-10-CM | POA: Diagnosis not present

## 2016-03-28 DIAGNOSIS — I10 Essential (primary) hypertension: Secondary | ICD-10-CM | POA: Diagnosis not present

## 2016-03-28 DIAGNOSIS — Z471 Aftercare following joint replacement surgery: Secondary | ICD-10-CM | POA: Diagnosis not present

## 2016-03-28 DIAGNOSIS — J449 Chronic obstructive pulmonary disease, unspecified: Secondary | ICD-10-CM | POA: Diagnosis not present

## 2016-03-28 DIAGNOSIS — M329 Systemic lupus erythematosus, unspecified: Secondary | ICD-10-CM | POA: Diagnosis not present

## 2016-03-28 DIAGNOSIS — Z96652 Presence of left artificial knee joint: Secondary | ICD-10-CM | POA: Diagnosis not present

## 2016-03-28 DIAGNOSIS — Z9981 Dependence on supplemental oxygen: Secondary | ICD-10-CM | POA: Diagnosis not present

## 2016-03-29 DIAGNOSIS — M329 Systemic lupus erythematosus, unspecified: Secondary | ICD-10-CM | POA: Diagnosis not present

## 2016-03-29 DIAGNOSIS — Z9981 Dependence on supplemental oxygen: Secondary | ICD-10-CM | POA: Diagnosis not present

## 2016-03-29 DIAGNOSIS — I10 Essential (primary) hypertension: Secondary | ICD-10-CM | POA: Diagnosis not present

## 2016-03-29 DIAGNOSIS — Z96652 Presence of left artificial knee joint: Secondary | ICD-10-CM | POA: Diagnosis not present

## 2016-03-29 DIAGNOSIS — J449 Chronic obstructive pulmonary disease, unspecified: Secondary | ICD-10-CM | POA: Diagnosis not present

## 2016-03-29 DIAGNOSIS — Z471 Aftercare following joint replacement surgery: Secondary | ICD-10-CM | POA: Diagnosis not present

## 2016-04-08 ENCOUNTER — Encounter: Payer: Self-pay | Admitting: Internal Medicine

## 2016-04-22 ENCOUNTER — Encounter: Payer: Self-pay | Admitting: Internal Medicine

## 2016-04-25 DIAGNOSIS — M1712 Unilateral primary osteoarthritis, left knee: Secondary | ICD-10-CM | POA: Diagnosis not present

## 2016-04-29 DIAGNOSIS — I6529 Occlusion and stenosis of unspecified carotid artery: Secondary | ICD-10-CM | POA: Diagnosis not present

## 2016-04-29 DIAGNOSIS — C189 Malignant neoplasm of colon, unspecified: Secondary | ICD-10-CM | POA: Diagnosis not present

## 2016-04-29 DIAGNOSIS — J449 Chronic obstructive pulmonary disease, unspecified: Secondary | ICD-10-CM | POA: Diagnosis not present

## 2016-04-29 DIAGNOSIS — M3500 Sicca syndrome, unspecified: Secondary | ICD-10-CM | POA: Diagnosis not present

## 2016-04-29 DIAGNOSIS — L930 Discoid lupus erythematosus: Secondary | ICD-10-CM | POA: Diagnosis not present

## 2016-04-29 DIAGNOSIS — L409 Psoriasis, unspecified: Secondary | ICD-10-CM | POA: Diagnosis not present

## 2016-04-29 DIAGNOSIS — Z6825 Body mass index (BMI) 25.0-25.9, adult: Secondary | ICD-10-CM | POA: Diagnosis not present

## 2016-04-29 DIAGNOSIS — D6489 Other specified anemias: Secondary | ICD-10-CM | POA: Diagnosis not present

## 2016-04-29 DIAGNOSIS — D899 Disorder involving the immune mechanism, unspecified: Secondary | ICD-10-CM | POA: Diagnosis not present

## 2016-04-29 DIAGNOSIS — I10 Essential (primary) hypertension: Secondary | ICD-10-CM | POA: Diagnosis not present

## 2016-04-29 DIAGNOSIS — D649 Anemia, unspecified: Secondary | ICD-10-CM | POA: Diagnosis not present

## 2016-04-29 DIAGNOSIS — I739 Peripheral vascular disease, unspecified: Secondary | ICD-10-CM | POA: Diagnosis not present

## 2016-04-29 DIAGNOSIS — Z961 Presence of intraocular lens: Secondary | ICD-10-CM | POA: Diagnosis not present

## 2016-04-29 DIAGNOSIS — D692 Other nonthrombocytopenic purpura: Secondary | ICD-10-CM | POA: Diagnosis not present

## 2016-05-23 ENCOUNTER — Encounter: Payer: Self-pay | Admitting: Family

## 2016-05-28 ENCOUNTER — Other Ambulatory Visit: Payer: Self-pay | Admitting: Emergency Medicine

## 2016-05-28 NOTE — Telephone Encounter (Signed)
Refill request for Stiolto. Rx sent. Nothing further needed.

## 2016-05-31 ENCOUNTER — Ambulatory Visit (INDEPENDENT_AMBULATORY_CARE_PROVIDER_SITE_OTHER)
Admission: RE | Admit: 2016-05-31 | Discharge: 2016-05-31 | Disposition: A | Discharge status: Home / Self Care | Payer: Medicare Other | Source: Ambulatory Visit | Attending: Family | Admitting: Family

## 2016-05-31 ENCOUNTER — Ambulatory Visit (INDEPENDENT_AMBULATORY_CARE_PROVIDER_SITE_OTHER): Payer: Medicare Other | Admitting: Family

## 2016-05-31 ENCOUNTER — Encounter: Payer: Self-pay | Admitting: Family

## 2016-05-31 ENCOUNTER — Ambulatory Visit (HOSPITAL_COMMUNITY)
Admission: RE | Admit: 2016-05-31 | Discharge: 2016-05-31 | Disposition: A | Discharge status: Home / Self Care | Payer: Medicare Other | Source: Ambulatory Visit | Attending: Family | Admitting: Family

## 2016-05-31 VITALS — BP 112/65 | HR 85 | Ht 65.0 in | Wt 160.2 lb

## 2016-05-31 DIAGNOSIS — I10 Essential (primary) hypertension: Secondary | ICD-10-CM | POA: Diagnosis not present

## 2016-05-31 DIAGNOSIS — R0989 Other specified symptoms and signs involving the circulatory and respiratory systems: Secondary | ICD-10-CM | POA: Diagnosis present

## 2016-05-31 DIAGNOSIS — I6523 Occlusion and stenosis of bilateral carotid arteries: Secondary | ICD-10-CM

## 2016-05-31 DIAGNOSIS — J449 Chronic obstructive pulmonary disease, unspecified: Secondary | ICD-10-CM | POA: Insufficient documentation

## 2016-05-31 DIAGNOSIS — I739 Peripheral vascular disease, unspecified: Secondary | ICD-10-CM | POA: Insufficient documentation

## 2016-05-31 DIAGNOSIS — I779 Disorder of arteries and arterioles, unspecified: Secondary | ICD-10-CM

## 2016-05-31 DIAGNOSIS — Z87891 Personal history of nicotine dependence: Secondary | ICD-10-CM | POA: Diagnosis not present

## 2016-05-31 NOTE — Patient Instructions (Addendum)
Peripheral Vascular Disease Peripheral vascular disease (PVD) is a disease of the blood vessels that are not part of your heart and brain. A simple term for PVD is poor circulation. In most cases, PVD narrows the blood vessels that carry blood from your heart to the rest of your body. This can result in a decreased supply of blood to your arms, legs, and internal organs, like your stomach or kidneys. However, it most often affects a person's lower legs and feet. There are two types of PVD.  Organic PVD. This is the more common type. It is caused by damage to the structure of blood vessels.  Functional PVD. This is caused by conditions that make blood vessels contract and tighten (spasm). Without treatment, PVD tends to get worse over time. PVD can also lead to acute ischemic limb. This is when an arm or limb suddenly has trouble getting enough blood. This is a medical emergency. CAUSES Each type of PVD has many different causes. The most common cause of PVD is buildup of a fatty material (plaque) inside of your arteries (atherosclerosis). Small amounts of plaque can break off from the walls of the blood vessels and become lodged in a smaller artery. This blocks blood flow and can cause acute ischemic limb. Other common causes of PVD include:  Blood clots that form inside of blood vessels.  Injuries to blood vessels.  Diseases that cause inflammation of blood vessels or cause blood vessel spasms.  Health behaviors and health history that increase your risk of developing PVD. RISK FACTORS  You may have a greater risk of PVD if you:  Have a family history of PVD.  Have certain medical conditions, including:  High cholesterol.  Diabetes.  High blood pressure (hypertension).  Coronary heart disease.  Past problems with blood clots.  Past injury, such as burns or a broken bone. These may have damaged blood vessels in your limbs.  Buerger disease. This is caused by inflamed blood  vessels in your hands and feet.  Some forms of arthritis.  Rare birth defects that affect the arteries in your legs.  Use tobacco.  Do not get enough exercise.  Are obese.  Are age 50 or older. SIGNS AND SYMPTOMS  PVD may cause many different symptoms. Your symptoms depend on what part of your body is not getting enough blood. Some common signs and symptoms include:  Cramps in your lower legs. This may be a symptom of poor leg circulation (claudication).  Pain and weakness in your legs while you are physically active that goes away when you rest (intermittent claudication).  Leg pain when at rest.  Leg numbness, tingling, or weakness.  Coldness in a leg or foot, especially when compared with the other leg.  Skin or hair changes. These can include:  Hair loss.  Shiny skin.  Pale or bluish skin.  Thick toenails.  Inability to get or maintain an erection (erectile dysfunction). People with PVD are more prone to developing ulcers and sores on their toes, feet, or legs. These may take longer than normal to heal. DIAGNOSIS Your health care provider may diagnose PVD from your signs and symptoms. The health care provider will also do a physical exam. You may have tests to find out what is causing your PVD and determine its severity. Tests may include:  Blood pressure recordings from your arms and legs and measurements of the strength of your pulses (pulse volume recordings).  Imaging studies using sound waves to take pictures of   the blood flow through your blood vessels (Doppler ultrasound).  Injecting a dye into your blood vessels before having imaging studies using:  X-rays (angiogram or arteriogram).  Computer-generated X-rays (CT angiogram).  A powerful electromagnetic field and a computer (magnetic resonance angiogram or MRA). TREATMENT Treatment for PVD depends on the cause of your condition and the severity of your symptoms. It also depends on your age. Underlying  causes need to be treated and controlled. These include long-lasting (chronic) conditions, such as diabetes, high cholesterol, and high blood pressure. You may need to first try making lifestyle changes and taking medicines. Surgery may be needed if these do not work. Lifestyle changes may include:  Quitting smoking.  Exercising regularly.  Following a low-fat, low-cholesterol diet. Medicines may include:  Blood thinners to prevent blood clots.  Medicines to improve blood flow.  Medicines to improve your blood cholesterol levels. Surgical procedures may include:  A procedure that uses an inflated balloon to open a blocked artery and improve blood flow (angioplasty).  A procedure to put in a tube (stent) to keep a blocked artery open (stent implant).  Surgery to reroute blood flow around a blocked artery (peripheral bypass surgery).  Surgery to remove dead tissue from an infected wound on the affected limb.  Amputation. This is surgical removal of the affected limb. This may be necessary in cases of acute ischemic limb that are not improved through medical or surgical treatments. HOME CARE INSTRUCTIONS  Take medicines only as directed by your health care provider.  Do not use any tobacco products, including cigarettes, chewing tobacco, or electronic cigarettes. If you need help quitting, ask your health care provider.  Lose weight if you are overweight, and maintain a healthy weight as directed by your health care provider.  Eat a diet that is low in fat and cholesterol. If you need help, ask your health care provider.  Exercise regularly. Ask your health care provider to suggest some good activities for you.  Use compression stockings or other mechanical devices as directed by your health care provider.  Take good care of your feet.  Wear comfortable shoes that fit well.  Check your feet often for any cuts or sores. SEEK MEDICAL CARE IF:  You have cramps in your legs  while walking.  You have leg pain when you are at rest.  You have coldness in a leg or foot.  Your skin changes.  You have erectile dysfunction.  You have cuts or sores on your feet that are not healing. SEEK IMMEDIATE MEDICAL CARE IF:  Your arm or leg turns cold and blue.  Your arms or legs become red, warm, swollen, painful, or numb.  You have chest pain or trouble breathing.  You suddenly have weakness in your face, arm, or leg.  You become very confused or lose the ability to speak.  You suddenly have a very bad headache or lose your vision.   This information is not intended to replace advice given to you by your health care provider. Make sure you discuss any questions you have with your health care provider.   Document Released: 11/14/2004 Document Revised: 10/28/2014 Document Reviewed: 03/17/2014 Elsevier Interactive Patient Education 2016 Elsevier Inc.    Stroke Prevention Some medical conditions and behaviors are associated with an increased chance of having a stroke. You may prevent a stroke by making healthy choices and managing medical conditions. HOW CAN I REDUCE MY RISK OF HAVING A STROKE?   Stay physically active. Get at   least 30 minutes of activity on most or all days.  Do not smoke. It may also be helpful to avoid exposure to secondhand smoke.  Limit alcohol use. Moderate alcohol use is considered to be:  No more than 2 drinks per day for men.  No more than 1 drink per day for nonpregnant women.  Eat healthy foods. This involves:  Eating 5 or more servings of fruits and vegetables a day.  Making dietary changes that address high blood pressure (hypertension), high cholesterol, diabetes, or obesity.  Manage your cholesterol levels.  Making food choices that are high in fiber and low in saturated fat, trans fat, and cholesterol may control cholesterol levels.  Take any prescribed medicines to control cholesterol as directed by your health care  provider.  Manage your diabetes.  Controlling your carbohydrate and sugar intake is recommended to manage diabetes.  Take any prescribed medicines to control diabetes as directed by your health care provider.  Control your hypertension.  Making food choices that are low in salt (sodium), saturated fat, trans fat, and cholesterol is recommended to manage hypertension.  Ask your health care provider if you need treatment to lower your blood pressure. Take any prescribed medicines to control hypertension as directed by your health care provider.  If you are 18-39 years of age, have your blood pressure checked every 3-5 years. If you are 40 years of age or older, have your blood pressure checked every year.  Maintain a healthy weight.  Reducing calorie intake and making food choices that are low in sodium, saturated fat, trans fat, and cholesterol are recommended to manage weight.  Stop drug abuse.  Avoid taking birth control pills.  Talk to your health care provider about the risks of taking birth control pills if you are over 35 years old, smoke, get migraines, or have ever had a blood clot.  Get evaluated for sleep disorders (sleep apnea).  Talk to your health care provider about getting a sleep evaluation if you snore a lot or have excessive sleepiness.  Take medicines only as directed by your health care provider.  For some people, aspirin or blood thinners (anticoagulants) are helpful in reducing the risk of forming abnormal blood clots that can lead to stroke. If you have the irregular heart rhythm of atrial fibrillation, you should be on a blood thinner unless there is a good reason you cannot take them.  Understand all your medicine instructions.  Make sure that other conditions (such as anemia or atherosclerosis) are addressed. SEEK IMMEDIATE MEDICAL CARE IF:   You have sudden weakness or numbness of the face, arm, or leg, especially on one side of the body.  Your face  or eyelid droops to one side.  You have sudden confusion.  You have trouble speaking (aphasia) or understanding.  You have sudden trouble seeing in one or both eyes.  You have sudden trouble walking.  You have dizziness.  You have a loss of balance or coordination.  You have a sudden, severe headache with no known cause.  You have new chest pain or an irregular heartbeat. Any of these symptoms may represent a serious problem that is an emergency. Do not wait to see if the symptoms will go away. Get medical help at once. Call your local emergency services (911 in [place] Do not drive yourself to the hospital.   This information is not intended to replace advice given to you by your health care provider. Make sure you discuss any questions   you have with your health care provider.   Document Released: 11/14/2004 Document Revised: 10/28/2014 Document Reviewed: 04/09/2013 Elsevier Interactive Patient Education 2016 Elsevier Inc.  

## 2016-05-31 NOTE — Progress Notes (Signed)
Chief Complaint: Follow up Extracranial Carotid Artery Stenosis/PAOD   History of Present Illness  Casey Wilkinson is a 75 y.o. female patient of Dr. Oneida Alar who has a history of mild PAD and carotid artery stenosis. She returns today for follow up. CT scan of the neck earlier in 2015 showed a 60% right internal carotid artery stenosis with minimal stenosis left internal carotid artery.  The patient denies any history of stroke or TIA symptoms. She denies any known heart problems. She was hospitalized at [facility] for 6 days, released in April 2016, for diverticulitis and colitis, Dr. Germain Osgood is her GI.   She had a colectomy in September 2016 for colon cancer, no radiation or chemo afterward.  She had a left knee replacement April 2017 and is walking better.    Pt Diabetic: No Pt smoker: former smoker, quit in 2000  Pt meds include: Statin :Yes ASA: Yes Other anticoagulants/antiplatelets: no   Past Medical History:  Diagnosis Date  . Anemia   . Arthritis    "all over"  . Carotid artery stenosis    right side followed by [facility]- 65%  . Colitis April 2016  . Colon cancer ([place])   . COPD (chronic obstructive pulmonary disease) ([place])   . Discoid lupus erythematosus   . Diverticulitis April 2016   and Colitis  . History of blood transfusion 01/2015, 06/2015   colectomy  . Hypertension   . Psoriatic arthritis ([place])   . Shortness of breath dyspnea    with exertion  . Squamous cell carcinoma, leg     Social History Social History  Substance Use Topics  . Smoking status: Former Smoker    Packs/day: 0.50    Years: 50.00    Types: Cigarettes    Quit date: 12/05/2000  . Smokeless tobacco: Never Used     Comment: started smoking in early 20s  . Alcohol use No    Family History Family History  Problem Relation Age of Onset  . Heart disease Father     Before age 73  . Hyperlipidemia Father   . Hypertension Father   . Pneumonia Mother   . Alzheimer's disease Mother      Surgical History Past Surgical History:  Procedure Laterality Date  . APPENDECTOMY  06/2015  . CATARACT EXTRACTION W/ INTRAOCULAR LENS  IMPLANT, BILATERAL Bilateral 2017  . COLONOSCOPY  06/23/15  . COLONOSCOPY W/ POLYPECTOMY    . CYSTOCELE REPAIR  2006  . DILATION AND CURETTAGE OF UTERUS  X 2  . EXCISIONAL HEMORRHOIDECTOMY  2006  . JOINT REPLACEMENT    . LAPAROSCOPIC PARTIAL COLECTOMY N/A 06/23/2015   Procedure: LAPAROSCOPIC ILEOCECETOMY;  Surgeon: Excell Seltzer, MD;  Location: [facility];  Service: General;  Laterality: N/A;  . MOHS SURGERY Left    "basal; LLE"  . RECTOCELE REPAIR  2006  . SQUAMOUS CELL CARCINOMA EXCISION  "several"   "legs mostly"  . TOTAL KNEE ARTHROPLASTY Left 01/31/2016   Procedure: LEFT TOTAL KNEE ARTHROPLASTY;  Surgeon: Leandrew Koyanagi, MD;  Location: [facility];  Service: Orthopedics;  Laterality: Left;  Marland Kitchen VAGINAL HYSTERECTOMY  1968   "total"    Allergies  Allergen Reactions  . Prochlorperazine Edisylate Nausea Only and Other (See Comments)    **COMPAZINE**   Stroke-like symptoms  . Sulfamethoxazole Nausea Only and Other (See Comments)    Pt taking Methotrexate, Sulfur drugs could cause SEVERE fatal reaction.  . Doxycycline Hives    Current Outpatient Prescriptions  Medication Sig Dispense Refill  .  aspirin EC 325 MG tablet Take 1 tablet (325 mg total) by mouth 2 (two) times daily. (Patient taking differently: Take 81 mg by mouth 2 (two) times daily. ) 84 tablet 0  . atorvastatin (LIPITOR) 10 MG tablet Take 1 tablet by mouth every morning.     . Calcium Carbonate-Vitamin D 600-400 MG-UNIT per tablet Take 1 tablet by mouth every morning.     . docusate sodium (COLACE) 100 MG capsule Take 100 mg by mouth at bedtime.     . folic acid (FOLVITE) 1 MG tablet Take 1 mg by mouth every morning.     . Loratadine 5 MG/5ML SOLN Take 10 mLs by mouth at bedtime.    . methocarbamol (ROBAXIN) 750 MG tablet Take 1 tablet (750 mg total) by mouth 2 (two) times daily as needed  for muscle spasms. 60 tablet 0  . methotrexate (RHEUMATREX) 2.5 MG tablet Take 15 mg by mouth once a week. Wednesdays    . Multiple Vitamins-Minerals (ABC PLUS SENIOR) TABS Take 1 tablet by mouth every morning.     Marland Kitchen POLY-IRON 150 150 MG capsule TK 1 C PO QD  5  . polyethylene glycol (MIRALAX / GLYCOLAX) packet Take 17 g by mouth daily as needed for mild constipation.     . Potassium Gluconate 595 MG CAPS Take 1 capsule by mouth at bedtime.     Marland Kitchen STIOLTO RESPIMAT 2.5-2.5 MCG/ACT AERS Use 2 puffs daily 12 g 3  . valsartan-hydrochlorothiazide (DIOVAN-HCT) 160-12.5 MG per tablet Take 1 tablet by mouth every morning.     . vitamin B-12 (CYANOCOBALAMIN) 500 MCG tablet Take 2,500 mcg by mouth every morning.     Marland Kitchen albuterol (PROVENTIL HFA;VENTOLIN HFA) 108 (90 BASE) MCG/ACT inhaler Inhale 2 puffs into the lungs every 6 (six) hours as needed for wheezing or shortness of breath.     Marland Kitchen HYDROcodone-acetaminophen (NORCO/VICODIN) 5-325 MG tablet Take 1 tablet by mouth every 6 (six) hours as needed for moderate pain.    Marland Kitchen ondansetron (ZOFRAN) 4 MG tablet Take 1-2 tablets (4-8 mg total) by mouth every 8 (eight) hours as needed for nausea or vomiting. (Patient not taking: Reported on 05/31/2016) 40 tablet 0  . oxyCODONE (OXY IR/ROXICODONE) 5 MG immediate release tablet Take 1-3 tablets (5-15 mg total) by mouth every 4 (four) hours as needed. (Patient not taking: Reported on 05/31/2016) 90 tablet 0  . oxyCODONE (OXYCONTIN) 10 mg 12 hr tablet Take 1 tablet (10 mg total) by mouth every 12 (twelve) hours. (Patient not taking: Reported on 05/31/2016) 10 tablet 0  . predniSONE (STERAPRED UNI-PAK 21 TAB) 10 MG (21) TBPK tablet Take 1 tablet (10 mg total) by mouth taper from 4 doses each day to 1 dose and stop. (Patient not taking: Reported on 05/31/2016) 21 tablet 0   No current facility-administered medications for this visit.     Review of Systems : See HPI for pertinent positives and negatives.  Physical  Examination  Vitals:   05/31/16 1412 05/31/16 1413  BP: 115/69 112/65  Pulse: 84 85  SpO2: 95%   Weight: 160 lb 3.2 oz (72.7 kg)   Height: '5\' 5"'$  (1.651 m)    Body mass index is 26.66 kg/m.  General: WDWN in NAD Gait: Normal HENT: WNL Eyes: Pupils equal Pulmonary: normal non-labored breathing, CTAB, good air movement Cardiac: RRR, no murmur detected  Abdomen: soft, NT, no masses palpated Skin: no rashes, no ulcers, no cellulitis.  VASCULAR EXAM  Carotid Bruits Right Left  Negative Negative   radial pulses are 2+ palpable and = Aorta is not palpable   VASCULAR EXAM: Extremities without ischemic changes  without Gangrene; without open wounds.     LE Pulses Right Left   FEMORAL 2+ palpable 2+ palpable    POPLITEAL not palpable  not palpable   POSTERIOR TIBIAL not palpable  not palpable    DORSALIS PEDIS  ANTERIOR TIBIAL not palpable  not palpable     Musculoskeletal: no muscle wasting or atrophy; no peripheral edema Neurologic: A&O X 3; Appropriate Affect ;  SENSATION: normal; MOTOR FUNCTION: 5/5 Symmetric, CN 2-12 intact Speech is fluent/normal   Non-Invasive Vascular Imaging CAROTID DUPLEX 05/31/2016   CEREBROVASCULAR DUPLEX EVALUATION    INDICATION: Carotid artery disease     PREVIOUS INTERVENTION(S):     DUPLEX EXAM:     RIGHT  LEFT  Peak Systolic Velocities (cm/s) End Diastolic Velocities (cm/s) Plaque LOCATION Peak Systolic Velocities (cm/s) End Diastolic Velocities (cm/s) Plaque  94 8  CCA PROXIMAL 90 17   71 9  CCA MID 117 17   75 14  CCA DISTAL 80 14 HT  270 16 CP ECA 106 6 HT  105 17 CP ICA PROXIMAL 84 18 HT  131 22 CP ICA MID 97 20   78 17  ICA DISTAL 80 16      ICA / CCA Ratio (PSV)   Antegrade  Vertebral  Flow Antegrade    Brachial Systolic Pressure (mmHg)   Multiphasic (Subclavian artery) Brachial Artery Waveforms Multiphasic (Subclavian artery)    Plaque Morphology:  HM = Homogeneous, HT = Heterogeneous, CP = Calcific Plaque, SP = Smooth Plaque, IP = Irregular Plaque     ADDITIONAL FINDINGS:     IMPRESSION: Right internal carotid artery velocities suggest a <40% stenosis; however, velocities are most likely underestimated due to calcified plaque with acoustic shadowing which make Doppler interrogation difficult.  Left internal carotid artery velocities suggest a <40% stenosis.     Compared to the previous exam:  No significant change in comparison to the last exam.    ABI (Date: 05/31/2016)  R: 0.76 (0.99, 02/27/15), DP: biphasic, PT: biphasic, TBI: dampened waveform  L: 0.75 (0.99), DP: monophasic, PT: biphasic, TBI: 0.56, toe pressure: 64   Assessment: Casey Wilkinson is a 75 y.o. female with stable minimal bilateral ICA stenosis and a history of mild peripheral artery occlusive disease. CT scan of the neck earlier in 2015 showed a 60% right internal carotid artery stenosis with minimal stenosis left internal carotid artery.  Since the above CT scan, three serial carotid Duplex have demonstrated <40% bilateral ICA stenoses.  She has no history of stroke or TIA.  ABI's have decreased bilaterally from 99% to 76% and 75%. This is likely due to a colectomy for colon cancer and knee replacement in the interrum slowing her down. She is starting to walk more. She has no claudication symptoms with walking.    Plan:  Graduated walking program discussed and how to achieve.   Follow-up in 6 months with ABI's, and Carotid Duplex scan in 2 years.   I discussed in depth with the patient the nature of atherosclerosis, and emphasized the importance of maximal medical management including strict control of blood pressure, blood glucose, and lipid levels, obtaining regular exercise, and  continued cessation of smoking.  The patient is aware that without maximal medical management the underlying atherosclerotic disease process will progress, limiting the benefit of any interventions. The patient was given information  about stroke prevention and what symptoms should prompt the patient to seek immediate medical care. Thank you for allowing [place] to participate in this patient's care.  Clemon Chambers, RN, MSN, FNP-C [facility] Office: [phone]  Clinic Physician: Donzetta Matters on call  05/31/16 2:36 PM

## 2016-06-06 DIAGNOSIS — D649 Anemia, unspecified: Secondary | ICD-10-CM | POA: Diagnosis not present

## 2016-06-18 ENCOUNTER — Ambulatory Visit (AMBULATORY_SURGERY_CENTER): Payer: Self-pay

## 2016-06-18 VITALS — Ht 64.0 in | Wt 162.0 lb

## 2016-06-18 DIAGNOSIS — Z85038 Personal history of other malignant neoplasm of large intestine: Secondary | ICD-10-CM

## 2016-06-18 DIAGNOSIS — L400 Psoriasis vulgaris: Secondary | ICD-10-CM | POA: Diagnosis not present

## 2016-06-18 DIAGNOSIS — L821 Other seborrheic keratosis: Secondary | ICD-10-CM | POA: Diagnosis not present

## 2016-06-18 DIAGNOSIS — L570 Actinic keratosis: Secondary | ICD-10-CM | POA: Diagnosis not present

## 2016-06-18 DIAGNOSIS — Z79899 Other long term (current) drug therapy: Secondary | ICD-10-CM | POA: Diagnosis not present

## 2016-06-18 DIAGNOSIS — Z85828 Personal history of other malignant neoplasm of skin: Secondary | ICD-10-CM | POA: Diagnosis not present

## 2016-06-18 MED ORDER — SUPREP BOWEL PREP KIT 17.5-3.13-1.6 GM/177ML PO SOLN: 1.0000 | Freq: Once | ORAL | 0 refills | Status: AC

## 2016-06-18 NOTE — Progress Notes (Signed)
No allergies to eggs or soy No diet meds No home oxygen No past problems with anesthesia  Declined emmi 

## 2016-06-19 DIAGNOSIS — Z79899 Other long term (current) drug therapy: Secondary | ICD-10-CM | POA: Diagnosis not present

## 2016-06-19 DIAGNOSIS — L400 Psoriasis vulgaris: Secondary | ICD-10-CM | POA: Diagnosis not present

## 2016-07-02 ENCOUNTER — Ambulatory Visit (AMBULATORY_SURGERY_CENTER): Payer: Medicare Other | Admitting: Internal Medicine

## 2016-07-02 ENCOUNTER — Encounter: Payer: Self-pay | Admitting: Internal Medicine

## 2016-07-02 VITALS — BP 108/73 | HR 80 | Temp 98.4°F | Resp 20 | Ht 64.0 in | Wt 162.0 lb

## 2016-07-02 DIAGNOSIS — I10 Essential (primary) hypertension: Secondary | ICD-10-CM | POA: Diagnosis not present

## 2016-07-02 DIAGNOSIS — Z85038 Personal history of other malignant neoplasm of large intestine: Secondary | ICD-10-CM

## 2016-07-02 DIAGNOSIS — E669 Obesity, unspecified: Secondary | ICD-10-CM | POA: Diagnosis not present

## 2016-07-02 DIAGNOSIS — M329 Systemic lupus erythematosus, unspecified: Secondary | ICD-10-CM | POA: Diagnosis not present

## 2016-07-02 DIAGNOSIS — J449 Chronic obstructive pulmonary disease, unspecified: Secondary | ICD-10-CM | POA: Diagnosis not present

## 2016-07-02 DIAGNOSIS — I739 Peripheral vascular disease, unspecified: Secondary | ICD-10-CM | POA: Diagnosis not present

## 2016-07-02 MED ORDER — SODIUM CHLORIDE 0.9 % IV SOLN: 500.0000 mL | INTRAVENOUS | Status: DC

## 2016-07-02 NOTE — Patient Instructions (Signed)
YOU HAD AN ENDOSCOPIC PROCEDURE TODAY AT [facility]:   Refer to the procedure report that was given to you for any specific questions about what was found during the examination.  If the procedure report does not answer your questions, please call your gastroenterologist to clarify.  If you requested that your care partner not be given the details of your procedure findings, then the procedure report has been included in a sealed envelope for you to review at your convenience later.  YOU SHOULD EXPECT: Some feelings of bloating in the abdomen. Passage of more gas than usual.  Walking can help get rid of the air that was put into your GI tract during the procedure and reduce the bloating. If you had a lower endoscopy (such as a colonoscopy or flexible sigmoidoscopy) you may notice spotting of blood in your stool or on the toilet paper. If you underwent a bowel prep for your procedure, you may not have a normal bowel movement for a few days.  Please Note:  You might notice some irritation and congestion in your nose or some drainage.  This is from the oxygen used during your procedure.  There is no need for concern and it should clear up in a day or so.  SYMPTOMS TO REPORT IMMEDIATELY:   Following lower endoscopy (colonoscopy or flexible sigmoidoscopy):  Excessive amounts of blood in the stool  Significant tenderness or worsening of abdominal pains  Swelling of the abdomen that is new, acute  Fever of 100F or higher   For urgent or emergent issues, a gastroenterologist can be reached at any hour by calling [phone].   DIET:  We do recommend a small meal at first, but then you may proceed to your regular diet.  Drink plenty of fluids but you should avoid alcoholic beverages for 24 hours. Try to eat a high fiber diet, and drink plenty of water to prevent Diverticulitis.  ACTIVITY:  You should plan to take it easy for the rest of today and you should NOT DRIVE or use heavy  machinery until tomorrow (because of the sedation medicines used during the test).    FOLLOW UP: Our staff will call the number listed on your records the next business day following your procedure to check on you and address any questions or concerns that you may have regarding the information given to you following your procedure. If we do not reach you, we will leave a message.  However, if you are feeling well and you are not experiencing any problems, there is no need to return our call.  We will assume that you have returned to your regular daily activities without incident.  If any biopsies were taken you will be contacted by phone or by letter within the next 1-3 weeks.  Please call us at [phone] if you have not heard about the biopsies in 3 weeks.    SIGNATURES/CONFIDENTIALITY: You and/or your care partner have signed paperwork which will be entered into your electronic medical record.  These signatures attest to the fact that that the information above on your After Visit Summary has been reviewed and is understood.  Full responsibility of the confidentiality of this discharge information lies with you and/or your care-partner.  Read all of the handouts given to you by your recovery room nurse.   Thank-you for choosing [place] for your healthcare needs today.

## 2016-07-02 NOTE — Op Note (Signed)
[facility] Patient Name: Casey Wilkinson Procedure Date: 07/02/2016 10:49 AM MRN: [id number] Endoscopist: Docia Chuck. Henrene Pastor , MD Age: 75 Referring MD:  Date of Birth: 05-10-1941 Gender: Female Account #: [id number] Procedure:                Colonoscopy Indications:              High risk colon cancer surveillance: Personal                            history of colon cancer. Malignant cecal polyp and                            large ascending colon polyps removed endoscopically                            June 2016. Subsequent laparoscopic right                            hemicolectomy with no residual tumor Medicines:                Monitored Anesthesia Care Procedure:                Pre-Anesthesia Assessment:                           - Prior to the procedure, a History and Physical                            was performed, and patient medications and                            allergies were reviewed. The patient's tolerance of                            previous anesthesia was also reviewed. The risks                            and benefits of the procedure and the sedation                            options and risks were discussed with the patient.                            All questions were answered, and informed consent                            was obtained. Prior Anticoagulants: The patient has                            taken no previous anticoagulant or antiplatelet                            agents. ASA Grade Assessment: II - A patient with  mild systemic disease. After reviewing the risks                            and benefits, the patient was deemed in                            satisfactory condition to undergo the procedure.                           After obtaining informed consent, the colonoscope                            was passed under direct vision. Throughout the                            procedure, the patient's blood  pressure, pulse, and                            oxygen saturations were monitored continuously. The                            Model CF-HQ190L [id number]) scope was introduced                            through the anus and advanced to the the                            ileocolonic anastomosis. The rectum was                            photographed. The quality of the bowel preparation                            was excellent. The colonoscopy was performed                            without difficulty. The patient tolerated the                            procedure well. The bowel preparation used was                            SUPREP. Scope In: 11:02:09 AM Scope Out: 11:15:01 AM Scope Withdrawal Time: 0 hours 9 minutes 21 seconds  Total Procedure Duration: 0 hours 12 minutes 52 seconds  Findings:                 There was evidence of a prior end-to-side                            ileo-colonic anastomosis in the ascending colon.                           Multiple small and large-mouthed diverticula were  found in the left colon.                           Internal hemorrhoids were found during retroflexion.                           The exam was otherwise without abnormality on                            direct and retroflexion views. Complications:            No immediate complications. Estimated blood loss:                            None. Estimated Blood Loss:     Estimated blood loss: none. Impression:               - End-to-side ileo-colonic anastomosis,, status                            post right hemicolectomy.                           - Diverticulosis in the left colon.                           - Internal hemorrhoids.                           - The examination was otherwise normal on direct                            and retroflexion views.                           - No specimens collected. Recommendation:           - Repeat colonoscopy in 3 years  for surveillance.                           - Patient has a contact number available for                            emergencies. The signs and symptoms of potential                            delayed complications were discussed with the                            patient. Return to normal activities tomorrow.                            Written discharge instructions were provided to the                            patient.                           -  Resume previous diet.                           - Continue present medications. Docia Chuck. Henrene Pastor, MD 07/02/2016 11:21:21 AM This report has been signed electronically. CC Letter to:             Excell Seltzer MD, MD

## 2016-07-02 NOTE — Progress Notes (Signed)
Report to PACU, RN, vss, BBS= Clear.  

## 2016-07-03 ENCOUNTER — Telehealth: Payer: Self-pay

## 2016-07-03 NOTE — Telephone Encounter (Signed)
  Follow up Call-  Call back number 07/02/2016 04/13/2015  Post procedure Call Back phone  # [phone]  Permission to leave phone message Yes Yes  Some recent data might be hidden     Patient questions:  Do you have a fever, pain , or abdominal swelling? No. Pain Score  0 *  Have you tolerated food without any problems? Yes.    Have you been able to return to your normal activities? Yes.    Do you have any questions about your discharge instructions: Diet   No. Medications  No. Follow up visit  No.  Do you have questions or concerns about your Care? No.  Actions: * If pain score is 4 or above: No action needed, pain <4.

## 2016-07-16 ENCOUNTER — Ambulatory Visit (INDEPENDENT_AMBULATORY_CARE_PROVIDER_SITE_OTHER)
Admission: RE | Admit: 2016-07-16 | Discharge: 2016-07-16 | Disposition: A | Discharge status: Home / Self Care | Payer: Medicare Other | Source: Ambulatory Visit | Attending: Emergency Medicine | Admitting: Emergency Medicine

## 2016-07-16 ENCOUNTER — Ambulatory Visit (INDEPENDENT_AMBULATORY_CARE_PROVIDER_SITE_OTHER): Payer: Medicare Other | Admitting: Emergency Medicine

## 2016-07-16 ENCOUNTER — Encounter: Payer: Self-pay | Admitting: Emergency Medicine

## 2016-07-16 DIAGNOSIS — I779 Disorder of arteries and arterioles, unspecified: Secondary | ICD-10-CM

## 2016-07-16 DIAGNOSIS — Z23 Encounter for immunization: Secondary | ICD-10-CM

## 2016-07-16 DIAGNOSIS — J449 Chronic obstructive pulmonary disease, unspecified: Secondary | ICD-10-CM | POA: Diagnosis not present

## 2016-07-16 DIAGNOSIS — L930 Discoid lupus erythematosus: Secondary | ICD-10-CM

## 2016-07-16 NOTE — Patient Instructions (Addendum)
We will do a CXR today to follow while you are on methotrexate.  Please continue your Stiolto as you are taking it Continue loratadine  Keep albuterol available in case you need it for shortness of breath.  Flu shot today Follow with Dr Lamonte Sakai in 12 months or sooner if you have any problems

## 2016-07-16 NOTE — Addendum Note (Signed)
Addended by: Desmond Dike C on: 07/16/2016 02:24 PM   Modules accepted: Orders

## 2016-07-16 NOTE — Progress Notes (Signed)
75 yo woman with HTN, former tobacco. Dx with severe AFL and severe COPD.   ROV 11/21/09 -- She is taking Advair qam instead of two times a day. Continues to be active, walks on treadmill once daily. Able to sing in the choir. No exacerbations since last visit, does not wheeze. Seems to be tolerating the Advair without much cough. 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 Seems to be tolerating.   ROV 12/06/10 -- f/u for severe COPD, also w hx of discoid SLE. annual f/u. Was treated for URI and exacerbation with steroids. No other flares in last year. Still able to exert, able to sing in the choir. Still has a bit of a raspy voice. Using Advair in the am only (keeps her from sleeping at night). Never uses her ProAir.   ROV 01/03/12 -- f/u for severe COPD, also w hx of discoid SLE and psoriasis now on MTX in an effort to get her off plaquanil. Regular follow up, says her breathing is doing ok, still sings in the choir. Still gets exertional SOB. Has a raspy voice at all times. Still on Advair qd, rarely needs ProAir.   ROV 07/02/12 -- f/u for severe COPD, also w hx of discoid SLE and psoriasis now on MTX and plaquanil.   Has been taking advair qd. Had CXR today (on MTX) >> No change compared with 11/09.   ROV 07/13/13 -- severe COPD, also w hx of discoid SLE and psoriasis now on MTX (stopped plaquanil in the last year). Last time tried spiriva, gave her a dry cough. Seems to be doing well on advair qd. No CXR yet today. No exacerbations. Good functional capacity.   ROV 07/14/14 -- follow up visit for COPD, SLE/ psoriasis on MTX. She has been doing well - no flares. She does have allergies, is using loratadine '5mg'$ . She is on Advair qd, seems to tolerate but has some scratchy voice.   ROV 07/14/15 -- history of severe obstruction from PFTs in 2010, being treated for severe COPD. She also has a history of discoid lupus and psoriasis for which she is being treated with  . She just underwent colonic resection for a newly dx colon CA. She is currently on Advair twice a day. She tells me that her breathing has done well, no exacerbations. She is not bothered by cough or hoarseness. Only limitation is climbing stairs.            ROV 07/16/16 -- the patient with a history of severe COPD, discoid lupus, psoriasis.   She is on loratadine for allergies. She has benefited from change to stiolto. She remains on methotrexate.  No flares. Able to exert without significant SOB. She does have to stop with her chores.   Vitals:   07/16/16 1359  BP: 126/68  BP Location: Right Arm  Patient Position: Sitting  Cuff Size: Normal  Pulse: 77  SpO2: 94%  Weight: 162 lb (73.5 kg)  Height: '5\' 4"'$  (1.626 m)   Gen: Pleasant, well-nourished, in no distress,  normal affect  ENT: No lesions,  mouth clear,  oropharynx clear, no postnasal drip  Neck: No JVD, no TMG, no carotid bruits  Lungs: No use of accessory muscles, clear without rales or rhonchi  Cardiovascular: RRR, heart sounds normal, no murmur or gallops, no peripheral edema  Musculoskeletal: No deformities, no cyanosis or clubbing  Neuro: alert, non focal  Skin: Warm, no lesions or rashes  COPD, mild-Smoker till 2012 Stable. Continue Stiolto, albuterol as needed  Please continue your Stiolto as you are taking it Continue loratadine  Keep albuterol available in case you need it for shortness of breath.  Flu shot today Follow with Dr Lamonte Sakai in 12 months or sooner if you have any problems  LUPUS ERYTHEMATOSUS, DISCOID On methotrexate.  We will check a chest ray today for surveillance while on this medication.   Baltazar Apo, MD, PhD 07/16/2016, 2:21 PM [facility] [phone] or if no answer [phone]

## 2016-07-16 NOTE — Assessment & Plan Note (Addendum)
Stable. Continue Stiolto, albuterol as needed  Please continue your Stiolto as you are taking it Continue loratadine  Keep albuterol available in case you need it for shortness of breath.  Flu shot today Follow with Dr Lamonte Sakai in 12 months or sooner if you have any problems

## 2016-07-16 NOTE — Assessment & Plan Note (Signed)
On methotrexate.  We will check a chest ray today for surveillance while on this medication.

## 2016-07-22 DIAGNOSIS — Z1231 Encounter for screening mammogram for malignant neoplasm of breast: Secondary | ICD-10-CM | POA: Diagnosis not present

## 2016-07-29 ENCOUNTER — Ambulatory Visit (INDEPENDENT_AMBULATORY_CARE_PROVIDER_SITE_OTHER): Payer: Medicare Other | Admitting: Orthopaedic Surgery

## 2016-07-29 DIAGNOSIS — M1712 Unilateral primary osteoarthritis, left knee: Secondary | ICD-10-CM

## 2016-09-11 NOTE — Addendum Note (Signed)
Addended by: Lianne Cure A on: 09/11/2016 01:33 PM   Modules accepted: Orders

## 2016-09-18 DIAGNOSIS — Z79899 Other long term (current) drug therapy: Secondary | ICD-10-CM | POA: Diagnosis not present

## 2016-09-18 DIAGNOSIS — L821 Other seborrheic keratosis: Secondary | ICD-10-CM | POA: Diagnosis not present

## 2016-09-18 DIAGNOSIS — Z85828 Personal history of other malignant neoplasm of skin: Secondary | ICD-10-CM | POA: Diagnosis not present

## 2016-09-18 DIAGNOSIS — L400 Psoriasis vulgaris: Secondary | ICD-10-CM | POA: Diagnosis not present

## 2016-09-18 DIAGNOSIS — L570 Actinic keratosis: Secondary | ICD-10-CM | POA: Diagnosis not present

## 2016-10-25 DIAGNOSIS — J209 Acute bronchitis, unspecified: Secondary | ICD-10-CM | POA: Diagnosis not present

## 2016-10-25 DIAGNOSIS — R05 Cough: Secondary | ICD-10-CM | POA: Diagnosis not present

## 2016-10-25 DIAGNOSIS — J449 Chronic obstructive pulmonary disease, unspecified: Secondary | ICD-10-CM | POA: Diagnosis not present

## 2016-10-25 DIAGNOSIS — D899 Disorder involving the immune mechanism, unspecified: Secondary | ICD-10-CM | POA: Diagnosis not present

## 2016-10-25 DIAGNOSIS — I10 Essential (primary) hypertension: Secondary | ICD-10-CM | POA: Diagnosis not present

## 2016-10-25 DIAGNOSIS — Z6826 Body mass index (BMI) 26.0-26.9, adult: Secondary | ICD-10-CM | POA: Diagnosis not present

## 2016-10-28 DIAGNOSIS — M859 Disorder of bone density and structure, unspecified: Secondary | ICD-10-CM | POA: Diagnosis not present

## 2016-10-28 DIAGNOSIS — E784 Other hyperlipidemia: Secondary | ICD-10-CM | POA: Diagnosis not present

## 2016-10-28 DIAGNOSIS — I10 Essential (primary) hypertension: Secondary | ICD-10-CM | POA: Diagnosis not present

## 2016-11-01 DIAGNOSIS — L409 Psoriasis, unspecified: Secondary | ICD-10-CM | POA: Diagnosis not present

## 2016-11-01 DIAGNOSIS — Z1389 Encounter for screening for other disorder: Secondary | ICD-10-CM | POA: Diagnosis not present

## 2016-11-01 DIAGNOSIS — I700 Atherosclerosis of aorta: Secondary | ICD-10-CM | POA: Diagnosis not present

## 2016-11-01 DIAGNOSIS — L930 Discoid lupus erythematosus: Secondary | ICD-10-CM | POA: Diagnosis not present

## 2016-11-01 DIAGNOSIS — Z6826 Body mass index (BMI) 26.0-26.9, adult: Secondary | ICD-10-CM | POA: Diagnosis not present

## 2016-11-01 DIAGNOSIS — I739 Peripheral vascular disease, unspecified: Secondary | ICD-10-CM | POA: Diagnosis not present

## 2016-11-01 DIAGNOSIS — C189 Malignant neoplasm of colon, unspecified: Secondary | ICD-10-CM | POA: Diagnosis not present

## 2016-11-01 DIAGNOSIS — Z0000 Encounter for general adult medical examination without abnormal findings: Secondary | ICD-10-CM | POA: Diagnosis not present

## 2016-11-01 DIAGNOSIS — D899 Disorder involving the immune mechanism, unspecified: Secondary | ICD-10-CM | POA: Diagnosis not present

## 2016-11-01 DIAGNOSIS — I6529 Occlusion and stenosis of unspecified carotid artery: Secondary | ICD-10-CM | POA: Diagnosis not present

## 2016-11-01 DIAGNOSIS — D692 Other nonthrombocytopenic purpura: Secondary | ICD-10-CM | POA: Diagnosis not present

## 2016-11-01 DIAGNOSIS — J449 Chronic obstructive pulmonary disease, unspecified: Secondary | ICD-10-CM | POA: Diagnosis not present

## 2016-11-21 DIAGNOSIS — Z1212 Encounter for screening for malignant neoplasm of rectum: Secondary | ICD-10-CM | POA: Diagnosis not present

## 2016-12-03 ENCOUNTER — Encounter: Payer: Self-pay | Admitting: Family

## 2016-12-05 DIAGNOSIS — L400 Psoriasis vulgaris: Secondary | ICD-10-CM | POA: Diagnosis not present

## 2016-12-05 DIAGNOSIS — Z79899 Other long term (current) drug therapy: Secondary | ICD-10-CM | POA: Diagnosis not present

## 2016-12-05 DIAGNOSIS — Z85828 Personal history of other malignant neoplasm of skin: Secondary | ICD-10-CM | POA: Diagnosis not present

## 2016-12-05 DIAGNOSIS — L570 Actinic keratosis: Secondary | ICD-10-CM | POA: Diagnosis not present

## 2016-12-12 ENCOUNTER — Ambulatory Visit (INDEPENDENT_AMBULATORY_CARE_PROVIDER_SITE_OTHER): Payer: Medicare Other | Admitting: Family

## 2016-12-12 ENCOUNTER — Ambulatory Visit (HOSPITAL_COMMUNITY)
Admission: RE | Admit: 2016-12-12 | Discharge: 2016-12-12 | Disposition: A | Discharge status: Home / Self Care | Payer: Medicare Other | Source: Ambulatory Visit | Attending: Family | Admitting: Family

## 2016-12-12 ENCOUNTER — Encounter: Payer: Self-pay | Admitting: Family

## 2016-12-12 VITALS — BP 135/78 | HR 78 | Temp 97.1°F | Resp 16 | Ht 64.0 in | Wt 168.0 lb

## 2016-12-12 DIAGNOSIS — I779 Disorder of arteries and arterioles, unspecified: Secondary | ICD-10-CM | POA: Diagnosis not present

## 2016-12-12 DIAGNOSIS — Z87891 Personal history of nicotine dependence: Secondary | ICD-10-CM

## 2016-12-12 DIAGNOSIS — I6523 Occlusion and stenosis of bilateral carotid arteries: Secondary | ICD-10-CM

## 2016-12-12 NOTE — Patient Instructions (Signed)
Peripheral Vascular Disease Peripheral vascular disease (PVD) is a disease of the blood vessels that are not part of your heart and brain. A simple term for PVD is poor circulation. In most cases, PVD narrows the blood vessels that carry blood from your heart to the rest of your body. This can result in a decreased supply of blood to your arms, legs, and internal organs, like your stomach or kidneys. However, it most often affects a person's lower legs and feet. There are two types of PVD.  Organic PVD. This is the more common type. It is caused by damage to the structure of blood vessels.  Functional PVD. This is caused by conditions that make blood vessels contract and tighten (spasm). Without treatment, PVD tends to get worse over time. PVD can also lead to acute ischemic limb. This is when an arm or limb suddenly has trouble getting enough blood. This is a medical emergency. Follow these instructions at home:  Take medicines only as told by your doctor.  Do not use any tobacco products, including cigarettes, chewing tobacco, or electronic cigarettes. If you need help quitting, ask your doctor.  Lose weight if you are overweight, and maintain a healthy weight as told by your doctor.  Eat a diet that is low in fat and cholesterol. If you need help, ask your doctor.  Exercise regularly. Ask your doctor for some good activities for you.  Take good care of your feet.  Wear comfortable shoes that fit well.  Check your feet often for any cuts or sores. Contact a doctor if:  You have cramps in your legs while walking.  You have leg pain when you are at rest.  You have coldness in a leg or foot.  Your skin changes.  You are unable to get or have an erection (erectile dysfunction).  You have cuts or sores on your feet that are not healing. Get help right away if:  Your arm or leg turns cold and blue.  Your arms or legs become red, warm, swollen, painful, or numb.  You have  chest pain or trouble breathing.  You suddenly have weakness in your face, arm, or leg.  You become very confused or you cannot speak.  You suddenly have a very bad headache.  You suddenly cannot see. This information is not intended to replace advice given to you by your health care provider. Make sure you discuss any questions you have with your health care provider. Document Released: 01/01/2010 Document Revised: 03/14/2016 Document Reviewed: 03/17/2014 Elsevier Interactive Patient Education  2017 Elsevier Inc.    Stroke Prevention Some medical conditions and behaviors are associated with an increased chance of having a stroke. You may prevent a stroke by making healthy choices and managing medical conditions. How can I reduce my risk of having a stroke?  Stay physically active. Get at least 30 minutes of activity on most or all days.  Do not smoke. It may also be helpful to avoid exposure to secondhand smoke.  Limit alcohol use. Moderate alcohol use is considered to be:  No more than 2 drinks per day for men.  No more than 1 drink per day for nonpregnant women.  Eat healthy foods. This involves:  Eating 5 or more servings of fruits and vegetables a day.  Making dietary changes that address high blood pressure (hypertension), high cholesterol, diabetes, or obesity.  Manage your cholesterol levels.  Making food choices that are high in fiber and low in saturated fat,   trans fat, and cholesterol may control cholesterol levels.  Take any prescribed medicines to control cholesterol as directed by your health care provider.  Manage your diabetes.  Controlling your carbohydrate and sugar intake is recommended to manage diabetes.  Take any prescribed medicines to control diabetes as directed by your health care provider.  Control your hypertension.  Making food choices that are low in salt (sodium), saturated fat, trans fat, and cholesterol is recommended to manage  hypertension.  Ask your health care provider if you need treatment to lower your blood pressure. Take any prescribed medicines to control hypertension as directed by your health care provider.  If you are 18-39 years of age, have your blood pressure checked every 3-5 years. If you are 40 years of age or older, have your blood pressure checked every year.  Maintain a healthy weight.  Reducing calorie intake and making food choices that are low in sodium, saturated fat, trans fat, and cholesterol are recommended to manage weight.  Stop drug abuse.  Avoid taking birth control pills.  Talk to your health care provider about the risks of taking birth control pills if you are over 35 years old, smoke, get migraines, or have ever had a blood clot.  Get evaluated for sleep disorders (sleep apnea).  Talk to your health care provider about getting a sleep evaluation if you snore a lot or have excessive sleepiness.  Take medicines only as directed by your health care provider.  For some people, aspirin or blood thinners (anticoagulants) are helpful in reducing the risk of forming abnormal blood clots that can lead to stroke. If you have the irregular heart rhythm of atrial fibrillation, you should be on a blood thinner unless there is a good reason you cannot take them.  Understand all your medicine instructions.  Make sure that other conditions (such as anemia or atherosclerosis) are addressed. Get help right away if:  You have sudden weakness or numbness of the face, arm, or leg, especially on one side of the body.  Your face or eyelid droops to one side.  You have sudden confusion.  You have trouble speaking (aphasia) or understanding.  You have sudden trouble seeing in one or both eyes.  You have sudden trouble walking.  You have dizziness.  You have a loss of balance or coordination.  You have a sudden, severe headache with no known cause.  You have new chest pain or an  irregular heartbeat. Any of these symptoms may represent a serious problem that is an emergency. Do not wait to see if the symptoms will go away. Get medical help at once. Call your local emergency services (911 in [place] Do not drive yourself to the hospital. This information is not intended to replace advice given to you by your health care provider. Make sure you discuss any questions you have with your health care provider. Document Released: 11/14/2004 Document Revised: 03/14/2016 Document Reviewed: 04/09/2013 Elsevier Interactive Patient Education  2017 Elsevier Inc.   

## 2016-12-12 NOTE — Progress Notes (Signed)
[facility] HISTORY AND PHYSICAL   MRN : [id number]  History of Present Illness:   Casey Wilkinson is a 76 y.o. female patient of Dr. Oneida Alar who has a history of mild PAD and carotid artery stenosis. She returns today for follow up. CT scan of the neck earlier in 2015 showed a 60% right internal carotid artery stenosis with minimal stenosis left internal carotid artery.  The patient denies any history of stroke or TIA symptoms. She denies any known heart problems. She was hospitalized at [facility] for 6 days, released in April 2016, for diverticulitis and colitis, Dr. Germain Osgood is her GI.   She had a colectomy in September 2016 for colon cancer, no radiation or chemo afterward.  She had a left knee replacement April 2017 and is walking better.  She uses a stationary bike at least 30 minutes daily. Also does daily seated leg exercises.   Pt Diabetic: No Pt smoker: former smoker, quit in 2000  Pt meds include: Statin :Yes ASA: Yes Other anticoagulants/antiplatelets: no    Current Outpatient Prescriptions  Medication Sig Dispense Refill  . albuterol (PROVENTIL HFA;VENTOLIN HFA) 108 (90 BASE) MCG/ACT inhaler Inhale 2 puffs into the lungs every 6 (six) hours as needed for wheezing or shortness of breath.     Marland Kitchen aspirin EC 81 MG tablet Take 81 mg by mouth daily.    Marland Kitchen atorvastatin (LIPITOR) 10 MG tablet Take 1 tablet by mouth every morning.     . Calcium Carbonate-Vitamin D 600-400 MG-UNIT per tablet Take 1 tablet by mouth every morning.     . docusate sodium (COLACE) 100 MG capsule Take 100 mg by mouth at bedtime.     . folic acid (FOLVITE) 1 MG tablet Take 1 mg by mouth every morning.     . Loratadine 5 MG/5ML SOLN Take 10 mLs by mouth at bedtime.    . methotrexate (RHEUMATREX) 2.5 MG tablet Take 15 mg by mouth once a week. Wednesdays    . Multiple Vitamins-Minerals (ABC PLUS SENIOR) TABS Take 1 tablet by mouth every morning.     Marland Kitchen POLY-IRON 150 150 MG  capsule TK 1 C PO QD  5  . polyethylene glycol (MIRALAX / GLYCOLAX) packet Take 17 g by mouth daily as needed for mild constipation.     . Potassium Gluconate 595 MG CAPS Take 1 capsule by mouth at bedtime.     Marland Kitchen STIOLTO RESPIMAT 2.5-2.5 MCG/ACT AERS Use 2 puffs daily 12 g 3  . valsartan-hydrochlorothiazide (DIOVAN-HCT) 160-12.5 MG per tablet Take 1 tablet by mouth every morning.     . vitamin B-12 (CYANOCOBALAMIN) 500 MCG tablet Take 2,500 mcg by mouth every morning.      Current Facility-Administered Medications  Medication Dose Route Frequency Provider Last Rate Last Dose  . 0.9 %  sodium chloride infusion  500 mL Intravenous Continuous Irene Shipper, MD        Past Medical History:  Diagnosis Date  . Anemia   . Arthritis    "all over"  . Carotid artery stenosis    right side followed by [facility]- 65%  . Colitis April 2016  . Colon cancer ([place])   . COPD (chronic obstructive pulmonary disease) ([place])   . Discoid lupus erythematosus   . Diverticulitis April 2016   and Colitis  . History of blood transfusion 01/2015, 06/2015   colectomy  . Hypertension   . Psoriatic arthritis ([place])   . Shortness of breath dyspnea  with exertion  . Squamous cell carcinoma, leg     Social History Social History  Substance Use Topics  . Smoking status: Former Smoker    Packs/day: 0.50    Years: 50.00    Types: Cigarettes    Quit date: 12/05/2000  . Smokeless tobacco: Never Used     Comment: started smoking in early 20s  . Alcohol use No    Family History Family History  Problem Relation Age of Onset  . Heart disease Father     Before age 86  . Hyperlipidemia Father   . Hypertension Father   . Pneumonia Mother   . Alzheimer's disease Mother   . Colon cancer Neg Hx     Surgical History Past Surgical History:  Procedure Laterality Date  . APPENDECTOMY  06/2015  . CATARACT EXTRACTION W/ INTRAOCULAR LENS  IMPLANT, BILATERAL Bilateral 2017  . COLONOSCOPY  06/23/15  . COLONOSCOPY W/  POLYPECTOMY    . CYSTOCELE REPAIR  2006  . DILATION AND CURETTAGE OF UTERUS  X 2  . EXCISIONAL HEMORRHOIDECTOMY  2006  . JOINT REPLACEMENT    . LAPAROSCOPIC PARTIAL COLECTOMY N/A 06/23/2015   Procedure: LAPAROSCOPIC ILEOCECETOMY;  Surgeon: Excell Seltzer, MD;  Location: [facility];  Service: General;  Laterality: N/A;  . MOHS SURGERY Left    "basal; LLE"  . RECTOCELE REPAIR  2006  . SQUAMOUS CELL CARCINOMA EXCISION  "several"   "legs mostly"  . TOTAL KNEE ARTHROPLASTY Left 01/31/2016   Procedure: LEFT TOTAL KNEE ARTHROPLASTY;  Surgeon: Leandrew Koyanagi, MD;  Location: [facility];  Service: Orthopedics;  Laterality: Left;  Marland Kitchen VAGINAL HYSTERECTOMY  1968   "total"    Allergies  Allergen Reactions  . Prochlorperazine Edisylate Nausea Only and Other (See Comments)    **COMPAZINE**   Stroke-like symptoms  . Sulfamethoxazole Nausea Only and Other (See Comments)    Pt taking Methotrexate, Sulfur drugs could cause SEVERE fatal reaction.  . Doxycycline Hives    Current Outpatient Prescriptions  Medication Sig Dispense Refill  . albuterol (PROVENTIL HFA;VENTOLIN HFA) 108 (90 BASE) MCG/ACT inhaler Inhale 2 puffs into the lungs every 6 (six) hours as needed for wheezing or shortness of breath.     Marland Kitchen aspirin EC 81 MG tablet Take 81 mg by mouth daily.    Marland Kitchen atorvastatin (LIPITOR) 10 MG tablet Take 1 tablet by mouth every morning.     . Calcium Carbonate-Vitamin D 600-400 MG-UNIT per tablet Take 1 tablet by mouth every morning.     . docusate sodium (COLACE) 100 MG capsule Take 100 mg by mouth at bedtime.     . folic acid (FOLVITE) 1 MG tablet Take 1 mg by mouth every morning.     . Loratadine 5 MG/5ML SOLN Take 10 mLs by mouth at bedtime.    . methotrexate (RHEUMATREX) 2.5 MG tablet Take 15 mg by mouth once a week. Wednesdays    . Multiple Vitamins-Minerals (ABC PLUS SENIOR) TABS Take 1 tablet by mouth every morning.     Marland Kitchen POLY-IRON 150 150 MG capsule TK 1 C PO QD  5  . polyethylene glycol (MIRALAX /  GLYCOLAX) packet Take 17 g by mouth daily as needed for mild constipation.     . Potassium Gluconate 595 MG CAPS Take 1 capsule by mouth at bedtime.     Marland Kitchen STIOLTO RESPIMAT 2.5-2.5 MCG/ACT AERS Use 2 puffs daily 12 g 3  . valsartan-hydrochlorothiazide (DIOVAN-HCT) 160-12.5 MG per tablet Take 1 tablet by mouth every morning.     Marland Kitchen  vitamin B-12 (CYANOCOBALAMIN) 500 MCG tablet Take 2,500 mcg by mouth every morning.      Current Facility-Administered Medications  Medication Dose Route Frequency Provider Last Rate Last Dose  . 0.9 %  sodium chloride infusion  500 mL Intravenous Continuous Irene Shipper, MD         REVIEW OF SYSTEMS: See HPI for pertinent positives and negatives.  Physical Examination Vitals:   12/12/16 1452 12/12/16 1455  BP: (!) 141/78 135/78  Pulse: 78   Resp: 16   Temp: 97.1 F (36.2 C)   TempSrc: Oral   SpO2: 92%   Weight: 168 lb (76.2 kg)   Height: '5\' 4"'$  (1.626 m)    Body mass index is 28.84 kg/m.  General:  WDWN in NAD Gait: Normal HENT: WNL Eyes: Pupils equal Pulmonary: normal non-labored breathing, CTAB, good air movement Cardiac: RRR, no murmur detected  Abdomen: soft, NT, no masses palpated Skin: no rashes, no ulcers, no cellulitis.  VASCULAR EXAM  Carotid Bruits Right Left   Negative Negative   radial pulses are 2+ palpable and = Aorta is not palpable   VASCULAR EXAM: Extremitieswithout ischemic changes  without Gangrene; without open wounds.     LE Pulses Right Left   FEMORAL 2+ palpable Faintly palpable    POPLITEAL not palpable  not palpable   POSTERIOR TIBIAL not palpable  not palpable    DORSALIS PEDIS  ANTERIOR TIBIAL not palpable  not palpable     Musculoskeletal: no muscle wasting or atrophy; no peripheral  edema Neurologic:A&O X 3; Appropriate Affect ;  SENSATION: normal; MOTOR FUNCTION: 5/5 Symmetric, CN 2-12 intact, Speech is fluent/normal      ASSESSMENT:  Casey Wilkinson is a 76 y.o. female with stable minimal bilateral ICA stenosis and a history of mild peripheral artery occlusive disease. She has no history of stroke or TIA.  She is walking more since her left knee replacement, rides a stationary bile daily, and daily seated leg exercises.  She has no claudication symptoms with walking. There are no signs of ischemia in her feet/legs.   Her atherosclerotic risk factors include former smoker and history of colon cancer. Fortunately she does not have DM.  She takes a daily ASA and statin.  DATA  ABI's (12-12-16): Right: 0.86 (0.76, 05-31-16), waveforms are biphasic; TBI: 0.57 (improved from dampened). Left: 0.79 (0.75, 05-31-16), waveforms are biphasic; TBI: 0.58 (was 0.56) Bilateral ABI's have improved.   CT scan of the neck earlier in 2015 showed a 60% right internal carotid artery stenosis with minimal stenosis left internal carotid artery.  Since the above CT scan, three serial carotid Duplex have demonstrated <40% bilateral ICA stenoses.    Plan:  Continue graduated walking program and daily seated leg exercises.   Follow-up in 1 year with ABI's, and Carotid Duplex scan in 2 years. I advised her to notify us if she develops concerns re the circulation in her feet/legs.   I discussed in depth with the patient the nature of atherosclerosis, and emphasized the importance of maximal medical management including strict control of blood pressure, blood glucose, and lipid levels, obtaining regular exercise, and cessation of smoking.  The patient is aware that without maximal medical management the underlying atherosclerotic disease process will progress, limiting the benefit of any interventions.  The patient was given information about stroke prevention and what  symptoms should prompt the patient to seek immediate medical care.  The patient was given information about PAD including signs, symptoms, treatment,  what symptoms should prompt the patient to seek immediate medical care, and risk reduction measures to take. Thank you for allowing [place] to participate in this patient's care.  Clemon Chambers, RN, MSN, FNP-C [facility] Office: [phone]  Clinic MD: Donzetta Matters on call 12/12/2016 3:10 PM

## 2016-12-17 NOTE — Addendum Note (Signed)
Addended by: Lianne Cure A on: 12/17/2016 01:22 PM   Modules accepted: Orders

## 2017-01-27 ENCOUNTER — Ambulatory Visit (INDEPENDENT_AMBULATORY_CARE_PROVIDER_SITE_OTHER): Payer: Medicare Other | Admitting: Orthopaedic Surgery

## 2017-01-28 ENCOUNTER — Ambulatory Visit (INDEPENDENT_AMBULATORY_CARE_PROVIDER_SITE_OTHER): Payer: Medicare Other | Admitting: Orthopaedic Surgery

## 2017-01-28 ENCOUNTER — Other Ambulatory Visit (INDEPENDENT_AMBULATORY_CARE_PROVIDER_SITE_OTHER): Payer: Medicare Other

## 2017-01-28 ENCOUNTER — Ambulatory Visit (INDEPENDENT_AMBULATORY_CARE_PROVIDER_SITE_OTHER): Payer: Medicare Other | Admitting: Emergency Medicine

## 2017-01-28 ENCOUNTER — Encounter (INDEPENDENT_AMBULATORY_CARE_PROVIDER_SITE_OTHER): Payer: Self-pay | Admitting: Orthopaedic Surgery

## 2017-01-28 ENCOUNTER — Ambulatory Visit (INDEPENDENT_AMBULATORY_CARE_PROVIDER_SITE_OTHER): Payer: Medicare Other

## 2017-01-28 ENCOUNTER — Encounter: Payer: Self-pay | Admitting: Emergency Medicine

## 2017-01-28 DIAGNOSIS — J449 Chronic obstructive pulmonary disease, unspecified: Secondary | ICD-10-CM | POA: Diagnosis not present

## 2017-01-28 DIAGNOSIS — I779 Disorder of arteries and arterioles, unspecified: Secondary | ICD-10-CM

## 2017-01-28 DIAGNOSIS — M1712 Unilateral primary osteoarthritis, left knee: Secondary | ICD-10-CM

## 2017-01-28 DIAGNOSIS — M79641 Pain in right hand: Secondary | ICD-10-CM

## 2017-01-28 LAB — CBC WITH DIFFERENTIAL/PLATELET
BASOS ABS: 0.1 10*3/uL (ref 0.0–0.1)
BASOS PCT: 0.9 % (ref 0.0–3.0)
EOS ABS: 0.1 10*3/uL (ref 0.0–0.7)
Eosinophils Relative: 0.7 % (ref 0.0–5.0)
LYMPHS PCT: 9 % — AB (ref 12.0–46.0)
Lymphs Abs: 0.6 10*3/uL — ABNORMAL LOW (ref 0.7–4.0)
MCHC: 33.3 g/dL (ref 30.0–36.0)
MCV: 93.9 fl (ref 78.0–100.0)
MONO ABS: 0.6 10*3/uL (ref 0.1–1.0)
Monocytes Relative: 8.9 % (ref 3.0–12.0)
Neutro Abs: 5.6 10*3/uL (ref 1.4–7.7)
Neutrophils Relative %: 80.5 % — ABNORMAL HIGH (ref 43.0–77.0)
Platelets: 158 10*3/uL (ref 150.0–400.0)
RBC: 2.79 Mil/uL — ABNORMAL LOW (ref 3.87–5.11)
RDW: 17.2 % — AB (ref 11.5–15.5)
WBC: 7 10*3/uL (ref 4.0–10.5)

## 2017-01-28 NOTE — Addendum Note (Signed)
Addended by: Maryanna Shape A on: 01/28/2017 04:48 PM   Modules accepted: Orders

## 2017-01-28 NOTE — Addendum Note (Signed)
Addended by: Maryanna Shape A on: 01/28/2017 04:24 PM   Modules accepted: Orders

## 2017-01-28 NOTE — Progress Notes (Signed)
76 yo woman with HTN, former tobacco. Dx with severe AFL and severe COPD.   ROV 01/28/17 -- follow up for severe COPD.  Also a hx SLE, psoriasis on MTX, allergic rhinitis. She reports that she has been symptomatic since January. She has AE-COPD in January characterized mainly by non-productive cough. No wheeze. She has been on [employer]. The cough has finally improved some since March, but she continues to have exertional fatigue, lightheadedness but not really dyspneia. She has a hx anemia, hasn't seen any bleeding. She rarely uses albuterol.   Vitals:   01/28/17 1536  BP: 118/60  Pulse: (!) 104  SpO2: 91%  Weight: 162 lb (73.5 kg)  Height: '5\' 4"'$  (1.626 m)   Gen: Pleasant, well-nourished, in no distress,  normal affect  ENT: No lesions,  mouth clear,  oropharynx clear, no postnasal drip  Neck: No JVD, no TMG, no carotid bruits  Lungs: No use of accessory muscles, clear without rales or rhonchi  Cardiovascular: RRR, heart sounds normal, no murmur or gallops, no peripheral edema  Musculoskeletal: No deformities, no cyanosis or clubbing  Neuro: alert, non focal  Skin: Warm, no lesions or rashes    COPD, mild-Smoker till 2012 Exertional fatigue nad weakness, unclear that this is actually dyspnea. No increasd WOB, wheeze. No cough. Will check CBC given hx anemia, CXR given MTX use. Walking oximetry to assess for occult desats   Baltazar Apo, MD, PhD 01/28/2017, 4:22 PM [facility] [phone] or if no answer [phone]

## 2017-01-28 NOTE — Addendum Note (Signed)
Addended by: Jannette Spanner on: 01/28/2017 04:40 PM   Modules accepted: Orders

## 2017-01-28 NOTE — Patient Instructions (Addendum)
Please continue your Stiolto as you are taking it for now.  Keep albuterol available to use 2 puffs up to every 4 hours if needed for shortness of breath.  We will perform blood work today (CBC). Walking oximetry today on room air.  CXR today Follow with Dr Lamonte Sakai next available.

## 2017-01-28 NOTE — Assessment & Plan Note (Signed)
Exertional fatigue nad weakness, unclear that this is actually dyspnea. No increasd WOB, wheeze. No cough. Will check CBC given hx anemia, CXR given MTX use. Walking oximetry to assess for occult desats

## 2017-01-28 NOTE — Progress Notes (Signed)
Office Visit Note   Patient: Casey Wilkinson           Date of Birth: 1941-03-28           MRN: [id number] Visit Date: 01/28/2017              Requested by: Shon Baton, MD [place],  [id number] PCP: Precious Reel, MD   Assessment & Plan: Visit Diagnoses:  1. Primary osteoarthritis of left knee   2. Pain of right hand     Plan: Left knee replacement is very stable. She is very active from this. She's had some pulmonary issues which slowed her down by her knee is not bothering her. Clinically speaking she is presenting with basal joint arthritis. A thumb [facility] orthotic was given today. She'll let us know if this continues to bother her. I will see her back in a year with 2 view x-rays of the left knee for her 2 year visit. I reminded her of her dental prophylaxis. Sample of Pennsaid gel was given to patient.  Follow-Up Instructions: Return in about 1 year (around 01/28/2018).   Orders:  Orders Placed This Encounter  Procedures  . XR Knee 1-2 Views Left   No orders of the defined types were placed in this encounter.     Procedures: No procedures performed   Clinical Data: No additional findings.   Subjective: Chief Complaint  Patient presents with  . Left Knee - Pain    Patient is one year status post left total knee replacement for psoriatic arthritis she is doing well. It aches when school. She is also complaining of right thumb pain and hand pain is worse with pinching and opening jars. She denies any injuries. Denies any clicking or crepitus.    Review of Systems  Constitutional: Negative.   HENT: Negative.   Eyes: Negative.   Respiratory: Negative.   Cardiovascular: Negative.   Endocrine: Negative.   Musculoskeletal: Negative.   Neurological: Negative.   Hematological: Negative.   Psychiatric/Behavioral: Negative.   All other systems reviewed and are negative.    Objective: Vital Signs: There were no vitals taken for this  visit.  Physical Exam  Constitutional: She is oriented to person, place, and time. She appears well-developed and well-nourished.  Pulmonary/Chest: Effort normal.  Neurological: She is alert and oriented to person, place, and time.  Skin: Skin is warm. Capillary refill takes less than 2 seconds.  Psychiatric: She has a normal mood and affect. Her behavior is normal. Judgment and thought content normal.  Nursing note and vitals reviewed.   Ortho Exam Left knee exam is benign with well-healed surgical scar with excellent range of motion. No swelling. Right hand exam shows positive grind sign of the thumb. She has no skin changes. Specialty Comments:  No specialty comments available.  Imaging: Xr Knee 1-2 Views Left  Result Date: 01/28/2017 Stable left knee replacement in good alignment    PMFS History: Patient Active Problem List   Diagnosis Date Noted  . Pain of right hand 01/28/2017  . 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 Degenerative arthritis of left knee 01/31/2016  . 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 Nausea with vomiting   . Acute ischemic colitis ([place])   . Rectal bleeding   . Abdominal pain   . Blood in stool   . 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 Colitis 02/06/2015  . Abdominal mass 02/06/2015  . HTN (hypertension) 02/06/2015  . Peripheral vascular  disease ([place]) 02/24/2014  . LUPUS ERYTHEMATOSUS, DISCOID 11/21/2009  . Essential hypertension 12/08/2008  . COPD, mild-Smoker till 2012 12/08/2008   Past Medical History:  Diagnosis Date  . Anemia   . Arthritis    "all over"  . Carotid artery stenosis    right side followed by [facility]- 65%  . Colitis April 2016  . Colon cancer ([place])   . COPD (chronic obstructive pulmonary disease) ([place])   . Discoid lupus erythematosus   . Diverticulitis April 2016   and Colitis  . History of blood transfusion 01/2015, 06/2015   colectomy  . Hypertension   . Psoriatic arthritis ([place])   . Shortness of  breath dyspnea    with exertion  . Squamous cell carcinoma, leg     Family History  Problem Relation Age of Onset  . Heart disease Father     Before age 31  . Hyperlipidemia Father   . Hypertension Father   . Pneumonia Mother   . Alzheimer's disease Mother   . Colon cancer Neg Hx     Past Surgical History:  Procedure Laterality Date  . APPENDECTOMY  06/2015  . CATARACT EXTRACTION W/ INTRAOCULAR LENS  IMPLANT, BILATERAL Bilateral 2017  . COLONOSCOPY  06/23/15  . COLONOSCOPY W/ POLYPECTOMY    . CYSTOCELE REPAIR  2006  . DILATION AND CURETTAGE OF UTERUS  X 2  . EXCISIONAL HEMORRHOIDECTOMY  2006  . JOINT REPLACEMENT    . LAPAROSCOPIC PARTIAL COLECTOMY N/A 06/23/2015   Procedure: LAPAROSCOPIC ILEOCECETOMY;  Surgeon: Excell Seltzer, MD;  Location: [facility];  Service: General;  Laterality: N/A;  . MOHS SURGERY Left    "basal; LLE"  . RECTOCELE REPAIR  2006  . SQUAMOUS CELL CARCINOMA EXCISION  "several"   "legs mostly"  . TOTAL KNEE ARTHROPLASTY Left 01/31/2016   Procedure: LEFT TOTAL KNEE ARTHROPLASTY;  Surgeon: Leandrew Koyanagi, MD;  Location: [facility];  Service: Orthopedics;  Laterality: Left;  Marland Kitchen VAGINAL HYSTERECTOMY  1968   "total"   Social History   Occupational History  . retired     Secretary/receptionist x66yr   Social History Main Topics  . Smoking status: Former Smoker    Packs/day: 0.50    Years: 50.00    Types: Cigarettes    Quit date: 12/05/2000  . Smokeless tobacco: Never Used     Comment: started smoking in early 20s  . Alcohol use No  . Drug use: No  . Sexual activity: Yes

## 2017-03-04 ENCOUNTER — Ambulatory Visit: Payer: Medicare Other | Admitting: Emergency Medicine

## 2017-03-04 DIAGNOSIS — L570 Actinic keratosis: Secondary | ICD-10-CM | POA: Diagnosis not present

## 2017-03-04 DIAGNOSIS — Z85828 Personal history of other malignant neoplasm of skin: Secondary | ICD-10-CM | POA: Diagnosis not present

## 2017-03-04 DIAGNOSIS — L400 Psoriasis vulgaris: Secondary | ICD-10-CM | POA: Diagnosis not present

## 2017-03-04 DIAGNOSIS — L821 Other seborrheic keratosis: Secondary | ICD-10-CM | POA: Diagnosis not present

## 2017-03-04 DIAGNOSIS — Z79899 Other long term (current) drug therapy: Secondary | ICD-10-CM | POA: Diagnosis not present

## 2017-03-12 ENCOUNTER — Encounter: Payer: Self-pay | Admitting: Emergency Medicine

## 2017-03-12 ENCOUNTER — Ambulatory Visit (INDEPENDENT_AMBULATORY_CARE_PROVIDER_SITE_OTHER): Payer: Medicare Other | Admitting: Emergency Medicine

## 2017-03-12 ENCOUNTER — Ambulatory Visit (INDEPENDENT_AMBULATORY_CARE_PROVIDER_SITE_OTHER)
Admission: RE | Admit: 2017-03-12 | Discharge: 2017-03-12 | Disposition: A | Discharge status: Home / Self Care | Payer: Medicare Other | Source: Ambulatory Visit | Attending: Emergency Medicine | Admitting: Emergency Medicine

## 2017-03-12 DIAGNOSIS — I779 Disorder of arteries and arterioles, unspecified: Secondary | ICD-10-CM

## 2017-03-12 DIAGNOSIS — J449 Chronic obstructive pulmonary disease, unspecified: Secondary | ICD-10-CM | POA: Diagnosis not present

## 2017-03-12 DIAGNOSIS — J9610 Chronic respiratory failure, unspecified whether with hypoxia or hypercapnia: Secondary | ICD-10-CM | POA: Insufficient documentation

## 2017-03-12 NOTE — Assessment & Plan Note (Signed)
Severe COPD. She appears to be tolerating a benefiting from [employer]. We will continue this. She uses albuterol rarely.

## 2017-03-12 NOTE — Patient Instructions (Addendum)
Please continue your Oxygen at 2L/min. we will perform overnight oximetry on room air to see if you need it at night. Please continue your Stiolto as you are taking it. Take albuterol 2 puffs up to every 4 hours if needed for shortness of breath.  We will perform a CXR today  Follow with Dr Lamonte Sakai in 6 months or sooner if you have any problems

## 2017-03-12 NOTE — Progress Notes (Signed)
77 yo woman with HTN, former tobacco. Dx with severe AFL and severe COPD.   ROV 01/28/17 -- follow up for severe COPD.  Also a hx SLE, psoriasis on MTX, allergic rhinitis. She reports that she has been symptomatic since January. She has AE-COPD in January characterized mainly by non-productive cough. No wheeze. She has been on [employer]. The cough has finally improved some since March, but she continues to have exertional fatigue, lightheadedness but not really dyspneia. She has a hx anemia, hasn't seen any bleeding. She rarely uses albuterol.   ROV 03/12/17 -- patient has a history of SLE/psoriasis on methotrexate, severe obstructive lung disease, allergic rhinitis. At her last visit she was having difficulty with exertional fatigue, lightheadedness. We checked CBC that showed hemoglobin 8.7. She reports that she feels better since we identified hypoxemia and started her on O2 2L/min. She is currently being weaned off MTX by her dermatologist. Did not get a CXR last time. She is on Stiolto - believes that she is doing well on it. She is using SABA rarely.    Vitals:   03/12/17 1553  BP: 128/60  Pulse: 78  SpO2: 98%  Weight: 162 lb 12.8 oz (73.8 kg)  Height: '5\' 4"'$  (1.626 m)   Gen: Pleasant, well-nourished, in no distress,  normal affect  ENT: No lesions,  mouth clear,  oropharynx clear, no postnasal drip  Neck: No JVD, no TMG, no carotid bruits  Lungs: No use of accessory muscles, clear without rales or rhonchi  Cardiovascular: RRR, heart sounds normal, no murmur or gallops, no peripheral edema  Musculoskeletal: No deformities, no cyanosis or clubbing  Neuro: alert, non focal  Skin: Warm, no lesions or rashes    COPD (chronic obstructive pulmonary disease) (HCC) Severe COPD. She appears to be tolerating a benefiting from [employer]. We will continue this. She uses albuterol rarely.  Chronic respiratory failure ([place]) With newly identified hypoxemia. Suspect that this is principally  related to her COPD although she has been on methotrexate. She is about to be weaned off of it. She needs a chest x-ray today to ensure no evidence of interstitial disease. Continue oxygen with exertion. We will perform an overnight oximetry on room air to see if she needs it at night.   Baltazar Apo, MD, PhD 03/12/2017, 4:18 PM [facility] [phone] or if no answer [phone]

## 2017-03-12 NOTE — Assessment & Plan Note (Signed)
With newly identified hypoxemia. Suspect that this is principally related to her COPD although she has been on methotrexate. She is about to be weaned off of it. She needs a chest x-ray today to ensure no evidence of interstitial disease. Continue oxygen with exertion. We will perform an overnight oximetry on room air to see if she needs it at night.

## 2017-03-19 ENCOUNTER — Telehealth: Payer: Self-pay | Admitting: Emergency Medicine

## 2017-03-19 NOTE — Telephone Encounter (Signed)
Spoke with [employer]. RB needs to sign the ONO order. Will route to RB.

## 2017-03-20 NOTE — Telephone Encounter (Signed)
Done

## 2017-04-03 ENCOUNTER — Other Ambulatory Visit: Payer: Self-pay | Admitting: Emergency Medicine

## 2017-04-08 DIAGNOSIS — M3500 Sicca syndrome, unspecified: Secondary | ICD-10-CM | POA: Diagnosis not present

## 2017-04-08 DIAGNOSIS — I700 Atherosclerosis of aorta: Secondary | ICD-10-CM | POA: Diagnosis not present

## 2017-04-08 DIAGNOSIS — L408 Other psoriasis: Secondary | ICD-10-CM | POA: Diagnosis not present

## 2017-04-08 DIAGNOSIS — D899 Disorder involving the immune mechanism, unspecified: Secondary | ICD-10-CM | POA: Diagnosis not present

## 2017-04-08 DIAGNOSIS — J449 Chronic obstructive pulmonary disease, unspecified: Secondary | ICD-10-CM | POA: Diagnosis not present

## 2017-04-08 DIAGNOSIS — C189 Malignant neoplasm of colon, unspecified: Secondary | ICD-10-CM | POA: Diagnosis not present

## 2017-04-08 DIAGNOSIS — L930 Discoid lupus erythematosus: Secondary | ICD-10-CM | POA: Diagnosis not present

## 2017-04-08 DIAGNOSIS — M859 Disorder of bone density and structure, unspecified: Secondary | ICD-10-CM | POA: Diagnosis not present

## 2017-04-08 DIAGNOSIS — I7389 Other specified peripheral vascular diseases: Secondary | ICD-10-CM | POA: Diagnosis not present

## 2017-04-08 DIAGNOSIS — Z6827 Body mass index (BMI) 27.0-27.9, adult: Secondary | ICD-10-CM | POA: Diagnosis not present

## 2017-04-08 DIAGNOSIS — D649 Anemia, unspecified: Secondary | ICD-10-CM | POA: Diagnosis not present

## 2017-04-08 DIAGNOSIS — J9610 Chronic respiratory failure, unspecified whether with hypoxia or hypercapnia: Secondary | ICD-10-CM | POA: Diagnosis not present

## 2017-04-08 DIAGNOSIS — I10 Essential (primary) hypertension: Secondary | ICD-10-CM | POA: Diagnosis not present

## 2017-04-10 DIAGNOSIS — D649 Anemia, unspecified: Secondary | ICD-10-CM | POA: Diagnosis not present

## 2017-05-01 DIAGNOSIS — L565 Disseminated superficial actinic porokeratosis (DSAP): Secondary | ICD-10-CM | POA: Diagnosis not present

## 2017-05-01 DIAGNOSIS — Z85828 Personal history of other malignant neoplasm of skin: Secondary | ICD-10-CM | POA: Diagnosis not present

## 2017-05-01 DIAGNOSIS — Z79899 Other long term (current) drug therapy: Secondary | ICD-10-CM | POA: Diagnosis not present

## 2017-05-01 DIAGNOSIS — L400 Psoriasis vulgaris: Secondary | ICD-10-CM | POA: Diagnosis not present

## 2017-05-01 DIAGNOSIS — L570 Actinic keratosis: Secondary | ICD-10-CM | POA: Diagnosis not present

## 2017-05-05 ENCOUNTER — Encounter (INDEPENDENT_AMBULATORY_CARE_PROVIDER_SITE_OTHER): Payer: Self-pay | Admitting: Orthopaedic Surgery

## 2017-05-05 ENCOUNTER — Ambulatory Visit (INDEPENDENT_AMBULATORY_CARE_PROVIDER_SITE_OTHER): Payer: Medicare Other | Admitting: Orthopaedic Surgery

## 2017-05-05 DIAGNOSIS — M79661 Pain in right lower leg: Secondary | ICD-10-CM | POA: Insufficient documentation

## 2017-05-05 DIAGNOSIS — M7051 Other bursitis of knee, right knee: Secondary | ICD-10-CM

## 2017-05-05 DIAGNOSIS — Z961 Presence of intraocular lens: Secondary | ICD-10-CM | POA: Diagnosis not present

## 2017-05-05 DIAGNOSIS — M7989 Other specified soft tissue disorders: Secondary | ICD-10-CM

## 2017-05-05 MED ORDER — BUPIVACAINE HCL 0.5 % IJ SOLN
2.0000 mL | INTRAMUSCULAR | Status: AC | PRN
  Administered 2017-05-05: 2 mL via INTRA_ARTICULAR

## 2017-05-05 MED ORDER — LIDOCAINE HCL 1 % IJ SOLN
2.0000 mL | INTRAMUSCULAR | Status: AC | PRN
  Administered 2017-05-05: 2 mL

## 2017-05-05 MED ORDER — METHYLPREDNISOLONE ACETATE 40 MG/ML IJ SUSP
40.0000 mg | INTRAMUSCULAR | Status: AC | PRN
  Administered 2017-05-05: 40 mg via INTRA_ARTICULAR

## 2017-05-05 NOTE — Progress Notes (Signed)
Office Visit Note   Patient: Casey Wilkinson           Date of Birth: 1941-09-30           MRN: [id number] Visit Date: 05/05/2017              Requested by: Shon Baton, [place] [address], [place] [address] PCP: Shon Baton, MD   Assessment & Plan: Visit Diagnoses:  1. Pes anserinus bursitis of right knee     Plan: Overall impression is right has bursitis. Injection was performed today. Doppler was also ordered to rule out DVT. We will contact the patient with the results.  Follow-Up Instructions: Return if symptoms worsen or fail to improve.   Orders:  No orders of the defined types were placed in this encounter.  No orders of the defined types were placed in this encounter.     Procedures: Large Joint Inj Date/Time: 05/05/2017 7:23 PM Performed by: Leandrew Koyanagi Authorized by: Leandrew Koyanagi   Consent Given by:  Patient Timeout: prior to procedure the correct patient, procedure, and site was verified   Indications:  Pain Location:  Knee Site:  R knee Prep: patient was prepped and draped in usual sterile fashion   Needle Size:  22 G Approach:  Medial Ultrasound Guidance: No   Fluoroscopic Guidance: No   Arthrogram: No   Medications:  2 mL lidocaine 1 %; 2 mL bupivacaine 0.5 %; 40 mg methylPREDNISolone acetate 40 MG/ML     Clinical Data: No additional findings.   Subjective: Chief Complaint  Patient presents with  . Right Leg - Pain, Edema    Casey Wilkinson is coming in today for her right knee pain. She states she has pain around the proximal medial portion of the tibia. She has recently been placed on chronic oxygen due to what sounds like pulmonary fibrosis. She is now using a walker. She does endorse swelling of her right lower extremity. Denies any trauma or numbness or tingling or fevers or chills    Review of Systems  Constitutional: Negative.   HENT: Negative.   Eyes: Negative.   Respiratory: Negative.   Cardiovascular: Negative.     Endocrine: Negative.   Musculoskeletal: Negative.   Neurological: Negative.   Hematological: Negative.   Psychiatric/Behavioral: Negative.   All other systems reviewed and are negative.    Objective: Vital Signs: There were no vitals taken for this visit.  Physical Exam  Constitutional: She is oriented to person, place, and time. She appears well-developed and well-nourished.  Pulmonary/Chest: Effort normal.  Neurological: She is alert and oriented to person, place, and time.  Skin: Skin is warm. Capillary refill takes less than 2 seconds.  Psychiatric: She has a normal mood and affect. Her behavior is normal. Judgment and thought content normal.  Nursing note and vitals reviewed.   Ortho Exam Right knee exam shows significant tenderness of the pes bursa. Joint line is nontender. Collaterals and cruciates are stable. Calf is mildly tender. No joint effusion. No skin changes or signs of infection. Specialty Comments:  No specialty comments available.  Imaging: No results found.   PMFS History: Patient Active Problem List   Diagnosis Date Noted  . Pain and swelling of right lower leg 05/05/2017  . Chronic respiratory failure ([place]) 03/12/2017  . Pain of right hand 01/28/2017  . 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 Degenerative arthritis of left knee 01/31/2016  . 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 Nausea  with vomiting   . Acute ischemic colitis ([facility])   . Rectal bleeding   . Abdominal pain   . Blood in stool   . 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 Colitis 02/06/2015  . Abdominal mass 02/06/2015  . HTN (hypertension) 02/06/2015  . Peripheral vascular disease ([facility]) 02/24/2014  . LUPUS ERYTHEMATOSUS, DISCOID 11/21/2009  . Essential hypertension 12/08/2008  . COPD (chronic obstructive pulmonary disease) ([place]) 12/08/2008   Past Medical History:  Diagnosis Date  . Anemia   . Arthritis    "all over"  . Carotid artery stenosis    right side  followed by [facility]- 65%  . Colitis April 2016  . Colon cancer ([place])   . COPD (chronic obstructive pulmonary disease) ([place])   . Discoid lupus erythematosus   . Diverticulitis April 2016   and Colitis  . History of blood transfusion 01/2015, 06/2015   colectomy  . Hypertension   . Psoriatic arthritis ([place])   . Shortness of breath dyspnea    with exertion  . Squamous cell carcinoma, leg     Family History  Problem Relation Age of Onset  . Heart disease Father        Before age 27  . Hyperlipidemia Father   . Hypertension Father   . Pneumonia Mother   . Alzheimer's disease Mother   . Colon cancer Neg Hx     Past Surgical History:  Procedure Laterality Date  . APPENDECTOMY  06/2015  . CATARACT EXTRACTION W/ INTRAOCULAR LENS  IMPLANT, BILATERAL Bilateral 2017  . COLONOSCOPY  06/23/15  . COLONOSCOPY W/ POLYPECTOMY    . CYSTOCELE REPAIR  2006  . DILATION AND CURETTAGE OF UTERUS  X 2  . EXCISIONAL HEMORRHOIDECTOMY  2006  . JOINT REPLACEMENT    . LAPAROSCOPIC PARTIAL COLECTOMY N/A 06/23/2015   Procedure: LAPAROSCOPIC ILEOCECETOMY;  Surgeon: Excell Seltzer, MD;  Location[facility];  Service: General;  Laterality: N/A;  . MOHS SURGERY Left    "basal; LLE"  . RECTOCELE REPAIR  2006  . SQUAMOUS CELL CARCINOMA EXCISION  "several"   "legs mostly"  . TOTAL KNEE ARTHROPLASTY Left 01/31/2016   Procedure: LEFT TOTAL KNEE ARTHROPLASTY;  Surgeon: Leandrew Koyanagi, MD;  Location: [facility];  Service: Orthopedics;  Laterality: Left;  Marland Kitchen VAGINAL HYSTERECTOMY  1968   "total"   Social History   Occupational History  . retired     Secretary/receptionist x64yrs   Social History Main Topics  . Smoking status: Former Smoker    Packs/day: 0.50    Years: 50.00    Types: Cigarettes    Quit date: 12/05/2000  . Smokeless tobacco: Never Used     Comment: started smoking in early 20s  . Alcohol use No  . Drug use: No  . Sexual activity: Yes

## 2017-05-06 ENCOUNTER — Other Ambulatory Visit (INDEPENDENT_AMBULATORY_CARE_PROVIDER_SITE_OTHER): Payer: Self-pay | Admitting: Orthopaedic Surgery

## 2017-05-06 ENCOUNTER — Ambulatory Visit (HOSPITAL_COMMUNITY)
Admission: RE | Admit: 2017-05-06 | Discharge: 2017-05-06 | Disposition: A | Discharge status: Home / Self Care | Payer: Medicare Other | Source: Ambulatory Visit | Attending: Orthopaedic Surgery | Admitting: Orthopaedic Surgery

## 2017-05-06 DIAGNOSIS — M7051 Other bursitis of knee, right knee: Secondary | ICD-10-CM | POA: Insufficient documentation

## 2017-05-06 DIAGNOSIS — M7989 Other specified soft tissue disorders: Principal | ICD-10-CM

## 2017-05-06 DIAGNOSIS — M79604 Pain in right leg: Secondary | ICD-10-CM

## 2017-05-06 NOTE — Progress Notes (Signed)
**  Preliminary report by tech**  Right lower extremity venous duplex complete. There is no evidence of deep or superficial vein thrombosis involving the right lower extremity. All visualized vessels appear patent and compressible. There is no evidence of a Baker's cyst on the right. Results were faxed to Dr. Erlinda Hong.  05/06/17 4:10 PM Casey Wilkinson RVT

## 2017-06-27 ENCOUNTER — Telehealth: Payer: Self-pay | Admitting: Emergency Medicine

## 2017-06-27 NOTE — Telephone Encounter (Signed)
error 

## 2017-07-01 DIAGNOSIS — L400 Psoriasis vulgaris: Secondary | ICD-10-CM | POA: Diagnosis not present

## 2017-07-01 DIAGNOSIS — L821 Other seborrheic keratosis: Secondary | ICD-10-CM | POA: Diagnosis not present

## 2017-07-01 DIAGNOSIS — Z79899 Other long term (current) drug therapy: Secondary | ICD-10-CM | POA: Diagnosis not present

## 2017-07-01 DIAGNOSIS — Z85828 Personal history of other malignant neoplasm of skin: Secondary | ICD-10-CM | POA: Diagnosis not present

## 2017-07-07 ENCOUNTER — Ambulatory Visit (INDEPENDENT_AMBULATORY_CARE_PROVIDER_SITE_OTHER): Payer: Medicare Other | Admitting: Emergency Medicine

## 2017-07-07 ENCOUNTER — Encounter: Payer: Self-pay | Admitting: Emergency Medicine

## 2017-07-07 DIAGNOSIS — I779 Disorder of arteries and arterioles, unspecified: Secondary | ICD-10-CM | POA: Diagnosis not present

## 2017-07-07 DIAGNOSIS — J449 Chronic obstructive pulmonary disease, unspecified: Secondary | ICD-10-CM | POA: Diagnosis not present

## 2017-07-07 DIAGNOSIS — J9611 Chronic respiratory failure with hypoxia: Secondary | ICD-10-CM

## 2017-07-07 NOTE — Patient Instructions (Addendum)
Please continue Stiolto as you have been taking it Have albuterol available to use 2 puffs as needed for shortness of breath Oxygen at 2 L/m while at rest. Increase to 3 L/m in preparation for exercise. Our goal will be to keep saturations above 88-90% even with exercise.  Get the flu shot this fall.  Follow with Dr Lamonte Sakai in 4 months or sooner if you have any problems.

## 2017-07-07 NOTE — Assessment & Plan Note (Signed)
Please continue Stiolto as you have been taking it Have albuterol available to use 2 puffs as needed for shortness of breath Get the flu shot this fall.  Follow with Dr Lamonte Sakai in 4 months or sooner if you have any problems.

## 2017-07-07 NOTE — Assessment & Plan Note (Signed)
She notes desaturations with exercise. Titrate to keep saturations greater than 88-90%.  Oxygen at 2 L/m while at rest. Increase to 3 L/m in preparation for exercise. Our goal will be to keep saturations above 88-90% even with exercise.

## 2017-07-07 NOTE — Progress Notes (Signed)
76 yo woman with HTN, former tobacco. Dx with severe AFL and severe COPD.   ROV 01/28/17 -- follow up for severe COPD.  Also a hx SLE, psoriasis on MTX, allergic rhinitis. She reports that she has been symptomatic since January. She has AE-COPD in January characterized mainly by non-productive cough. No wheeze. She has been on [employer]. The cough has finally improved some since March, but she continues to have exertional fatigue, lightheadedness but not really dyspneia. She has a hx anemia, hasn't seen any bleeding. She rarely uses albuterol.   ROV 03/12/17 -- patient has a history of SLE/psoriasis on methotrexate, severe obstructive lung disease, allergic rhinitis. At her last visit she was having difficulty with exertional fatigue, lightheadedness. We checked CBC that showed hemoglobin 8.7. She reports that she feels better since we identified hypoxemia and started her on O2 2L/min. She is currently being weaned off MTX by her dermatologist. Did not get a CXR last time. She is on Stiolto - believes that she is doing well on it. She is using SABA rarely.   rov 07/07/17 -- patient has a history of severe obstructive lung disease, allergic rhinitis. She also has lupus/psoriasis. She's been treated in the past with methotrexate, being weaned but suspect she will not come off completely. We performed chest x-ray on 03/12/17 that I reviewed. This showed hyperinflation but no evidence of interstitial lung disease. A CT scan of the chest from 09/12/08 did not show ILD. Currently managed on Stiolto. She has albuterol available, uses it approximately once a month. She is using O2 at 2L/min. She notes that she desaturates with exertion to mid 80's on 2L/min. Needs the flu shot. No exacerbations since last time. She does have some mild cough in the morning with scant sputum production.   Vitals:   07/07/17 1004 07/07/17 1005  BP:  122/68  Pulse:  78  SpO2:  94%  Weight: 166 lb (75.3 kg)   Height: 5\' 2"  (1.575 m)     Gen: Pleasant, well-nourished, in no distress,  normal affect  ENT: No lesions,  mouth clear,  oropharynx clear, no postnasal drip  Neck: No JVD, no TMG, no carotid bruits  Lungs: No use of accessory muscles, clear without rales or rhonchi  Cardiovascular: RRR, heart sounds normal, no murmur or gallops, no peripheral edema  Musculoskeletal: No deformities, no cyanosis or clubbing  Neuro: alert, non focal  Skin: Warm, no lesions or rashes    COPD (chronic obstructive pulmonary disease) ([facility]) Please continue Stiolto as you have been taking it Have albuterol available to use 2 puffs as needed for shortness of breath Get the flu shot this fall.  Follow with Dr Lamonte Sakai in 4 months or sooner if you have any problems.  Chronic respiratory failure ([place]) She notes desaturations with exercise. Titrate to keep saturations greater than 88-90%.  Oxygen at 2 L/m while at rest. Increase to 3 L/m in preparation for exercise. Our goal will be to keep saturations above 88-90% even with exercise.    Baltazar Apo, MD, PhD 07/07/2017, 10:22 AM [facility] [phone] or if no answer [phone]

## 2017-07-19 DIAGNOSIS — Z23 Encounter for immunization: Secondary | ICD-10-CM | POA: Diagnosis not present

## 2017-07-23 DIAGNOSIS — Z1231 Encounter for screening mammogram for malignant neoplasm of breast: Secondary | ICD-10-CM | POA: Diagnosis not present

## 2017-07-28 DIAGNOSIS — C44722 Squamous cell carcinoma of skin of right lower limb, including hip: Secondary | ICD-10-CM | POA: Diagnosis not present

## 2017-07-28 DIAGNOSIS — C44729 Squamous cell carcinoma of skin of left lower limb, including hip: Secondary | ICD-10-CM | POA: Diagnosis not present

## 2017-07-28 DIAGNOSIS — Z85828 Personal history of other malignant neoplasm of skin: Secondary | ICD-10-CM | POA: Diagnosis not present

## 2017-07-28 DIAGNOSIS — D485 Neoplasm of uncertain behavior of skin: Secondary | ICD-10-CM | POA: Diagnosis not present

## 2017-07-28 DIAGNOSIS — M793 Panniculitis, unspecified: Secondary | ICD-10-CM | POA: Diagnosis not present

## 2017-08-05 ENCOUNTER — Telehealth: Payer: Self-pay | Admitting: Emergency Medicine

## 2017-08-05 NOTE — Telephone Encounter (Signed)
Spoke with patient. She stated that she was prescribed cephalexin to take 30 minutes before surgery tomorrow by Dr. Sarajane Jews with [facility]. 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   She is concerned about taking this medication because of the risks that may cause wheezing and SOB. She stated Dr. Sarajane Jews was ok with her taking this medication, but she wanted to hear from Dr. Lamonte Sakai first.   RB, please advise. Thanks!

## 2017-08-05 NOTE — Telephone Encounter (Signed)
Called and spoke with pt and she is aware of RB recs at this time.  Nothing further is needed.

## 2017-08-05 NOTE — Telephone Encounter (Signed)
Please let her know that I think the risk of this medication causing wheezing or breathing problem is very low. She should go ahead and take the medication as recommended. If there is a breathing problem she should call us and we can troubleshoot.

## 2017-08-06 DIAGNOSIS — Z85828 Personal history of other malignant neoplasm of skin: Secondary | ICD-10-CM | POA: Diagnosis not present

## 2017-08-06 DIAGNOSIS — C44722 Squamous cell carcinoma of skin of right lower limb, including hip: Secondary | ICD-10-CM | POA: Diagnosis not present

## 2017-08-20 DIAGNOSIS — Z4801 Encounter for change or removal of surgical wound dressing: Secondary | ICD-10-CM | POA: Diagnosis not present

## 2017-08-20 DIAGNOSIS — L0889 Other specified local infections of the skin and subcutaneous tissue: Secondary | ICD-10-CM | POA: Diagnosis not present

## 2017-09-03 DIAGNOSIS — L0889 Other specified local infections of the skin and subcutaneous tissue: Secondary | ICD-10-CM | POA: Diagnosis not present

## 2017-09-30 DIAGNOSIS — Z79899 Other long term (current) drug therapy: Secondary | ICD-10-CM | POA: Diagnosis not present

## 2017-09-30 DIAGNOSIS — L570 Actinic keratosis: Secondary | ICD-10-CM | POA: Diagnosis not present

## 2017-09-30 DIAGNOSIS — Z85828 Personal history of other malignant neoplasm of skin: Secondary | ICD-10-CM | POA: Diagnosis not present

## 2017-09-30 DIAGNOSIS — L400 Psoriasis vulgaris: Secondary | ICD-10-CM | POA: Diagnosis not present

## 2017-10-27 DIAGNOSIS — E7849 Other hyperlipidemia: Secondary | ICD-10-CM | POA: Diagnosis not present

## 2017-10-27 DIAGNOSIS — M859 Disorder of bone density and structure, unspecified: Secondary | ICD-10-CM | POA: Diagnosis not present

## 2017-10-27 DIAGNOSIS — R82998 Other abnormal findings in urine: Secondary | ICD-10-CM | POA: Diagnosis not present

## 2017-10-27 DIAGNOSIS — I10 Essential (primary) hypertension: Secondary | ICD-10-CM | POA: Diagnosis not present

## 2017-11-03 DIAGNOSIS — D899 Disorder involving the immune mechanism, unspecified: Secondary | ICD-10-CM | POA: Diagnosis not present

## 2017-11-03 DIAGNOSIS — Z1389 Encounter for screening for other disorder: Secondary | ICD-10-CM | POA: Diagnosis not present

## 2017-11-03 DIAGNOSIS — Z6827 Body mass index (BMI) 27.0-27.9, adult: Secondary | ICD-10-CM | POA: Diagnosis not present

## 2017-11-03 DIAGNOSIS — Z1212 Encounter for screening for malignant neoplasm of rectum: Secondary | ICD-10-CM | POA: Diagnosis not present

## 2017-11-03 DIAGNOSIS — L408 Other psoriasis: Secondary | ICD-10-CM | POA: Diagnosis not present

## 2017-11-03 DIAGNOSIS — L930 Discoid lupus erythematosus: Secondary | ICD-10-CM | POA: Diagnosis not present

## 2017-11-03 DIAGNOSIS — D692 Other nonthrombocytopenic purpura: Secondary | ICD-10-CM | POA: Diagnosis not present

## 2017-11-03 DIAGNOSIS — I6529 Occlusion and stenosis of unspecified carotid artery: Secondary | ICD-10-CM | POA: Diagnosis not present

## 2017-11-03 DIAGNOSIS — C189 Malignant neoplasm of colon, unspecified: Secondary | ICD-10-CM | POA: Diagnosis not present

## 2017-11-03 DIAGNOSIS — Z0000 Encounter for general adult medical examination without abnormal findings: Secondary | ICD-10-CM | POA: Diagnosis not present

## 2017-11-03 DIAGNOSIS — J9610 Chronic respiratory failure, unspecified whether with hypoxia or hypercapnia: Secondary | ICD-10-CM | POA: Diagnosis not present

## 2017-11-03 DIAGNOSIS — M3500 Sicca syndrome, unspecified: Secondary | ICD-10-CM | POA: Diagnosis not present

## 2017-11-03 DIAGNOSIS — J449 Chronic obstructive pulmonary disease, unspecified: Secondary | ICD-10-CM | POA: Diagnosis not present

## 2017-11-04 ENCOUNTER — Ambulatory Visit: Payer: Medicare Other | Admitting: Pulmonary Disease

## 2017-11-04 ENCOUNTER — Ambulatory Visit (INDEPENDENT_AMBULATORY_CARE_PROVIDER_SITE_OTHER)
Admission: RE | Admit: 2017-11-04 | Discharge: 2017-11-04 | Disposition: A | Discharge status: Home / Self Care | Payer: Medicare Other | Source: Ambulatory Visit | Attending: Internal Medicine | Admitting: Internal Medicine

## 2017-11-04 ENCOUNTER — Ambulatory Visit (INDEPENDENT_AMBULATORY_CARE_PROVIDER_SITE_OTHER): Payer: Medicare Other | Admitting: Internal Medicine

## 2017-11-04 ENCOUNTER — Encounter: Payer: Self-pay | Admitting: Internal Medicine

## 2017-11-04 VITALS — BP 120/76 | HR 64 | Ht 62.0 in | Wt 166.8 lb

## 2017-11-04 DIAGNOSIS — J310 Chronic rhinitis: Secondary | ICD-10-CM | POA: Diagnosis not present

## 2017-11-04 DIAGNOSIS — J9611 Chronic respiratory failure with hypoxia: Secondary | ICD-10-CM

## 2017-11-04 DIAGNOSIS — J449 Chronic obstructive pulmonary disease, unspecified: Secondary | ICD-10-CM

## 2017-11-04 MED ORDER — FLUTICASONE-UMECLIDIN-VILANT 100-62.5-25 MCG/INH IN AEPB: 1.0000 | INHALATION_SPRAY | Freq: Every day | RESPIRATORY_TRACT | 0 refills | Status: DC

## 2017-11-04 NOTE — Progress Notes (Signed)
11/04/17-77 year old female former smoker followed by Dr Lamonte Sakai  for  severe obstructive lung disease, chronic hypoxic resp failure, allergic rhinitis. She also has discoid lupus/psoriasis/ MTX, HBP, chronic anemia O2 2-3L / Advanced ----RB patient-4 month ROV for COPD. DME: [facility]. Pt continues to wear O2 24/7. Pt feels that her breathing is a little worse than usual. Several times in past 2 weeks her O2 levels drop with exertion and HR increases in the upper 120's. Stiolto 2.5 Requalified for O2 with room air sat dropped to 84% She has been more aware of dyspnea with exertion over the last 2 weeks, easy fatigue.  Incidental complaint of dry stuffy nose.  Denies chest pain, palpitation, obvious infection, cough or wheeze, bleeding.  No history of PE. Brings labs from Dr Virgina Jock showing hemoglobin 10.3.  "Anemic all my life".  Needed transfusion with knee replacement last year.  Review of CBCs in our system indicates hemoglobin usually 9-10 range.  Denies recent obvious blood loss.  She still has bandage right calf from deep resection skin cancer. She has been using Stiolto but asks about ads seen on TV for Trelegy. Office Spirometry 11/04/17-severe obstructive airways disease with restricted exhaled volume.  FVC 1.56/63%, FEV1 0.71/38%, ratio 0.46, FEF 25-75% 0.25/17%. CXR 03/13/17- IMPRESSION: COPD/chronic changes.  No active disease.  Patient seen in the office today and instructed on use of Trelegy.  Patient expressed understanding and demonstrated technique. Katie Welchel,CMA  ROS-see HPI   + = positive Constitutional:    weight loss, night sweats, fevers, chills, fatigue, lassitude. HEENT:    headaches, difficulty swallowing, tooth/dental problems, sore throat,       sneezing, itching, ear ache, nasal congestion, post nasal drip, snoring CV:    chest pain, orthopnea, PND, swelling in lower extremities, anasarca,                                  dizziness, palpitations Resp:   +shortness  of breath with exertion or at rest.                productive cough,   non-productive cough, coughing up of blood.              change in color of mucus.  wheezing.   Skin:    rash or lesions. GI:  No-   heartburn, indigestion, abdominal pain, nausea, vomiting, diarrhea,                 change in bowel habits, loss of appetite GU: dysuria, change in color of urine, no urgency or frequency.   flank pain. MS:   joint pain, stiffness, decreased range of motion, back pain. Neuro-     nothing unusual Psych:  change in mood or affect.  depression or anxiety.   memory loss.  OBJ- Physical Exam General- Alert, Oriented, Affect-appropriate, Distress- none acute, O2 2 L Skin- rash-none, lesions- none, excoriation- none Lymphadenopathy- none Head- atraumatic            Eyes- Gross vision intact, PERRLA, conjunctivae and secretions clear            Ears- Hearing, canals-normal            Nose- Clear, no-Septal dev, mucus, polyps, erosion, perforation             Throat- Mallampati II , mucosa clear , drainage- none, tonsils- atrophic Neck- flexible , trachea midline, no  stridor , thyroid nl, carotid no bruit Chest - symmetrical excursion , unlabored           Heart/CV- RRR , no murmur , no gallop  , no rub, nl s1 s2                           - JVD- none , edema- none, stasis changes- none, varices- none           Lung- clear to P&A, wheeze- none, cough- none , dullness-none, rub- none           Chest wall-  Abd-  Br/ Gen/ Rectal- Not done, not indicated Extrem- cyanosis- none, clubbing, none, atrophy- none, strength- nl, + bandage medial right calf Neuro- grossly intact to observation

## 2017-11-04 NOTE — Assessment & Plan Note (Signed)
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 Plan-add humidifier to home concentrator.  Try nasal saline gel.

## 2017-11-04 NOTE — Assessment & Plan Note (Signed)
We discussed oxygen guidelines. Plan-can turn home concentrator up to 4 L when active in the home.  I do not think her portable will go that high, and may need to be reconfigured later.

## 2017-11-04 NOTE — Assessment & Plan Note (Signed)
Her only symptom changes increased dyspnea on exertion without evidence of underlying reason.  She is anemic, but that does not look a lot different from previous values.  She is curious about trying Trelegy.  I do not know why work that her in the absence of obvious airway inflammation, but we will let her try samples of Trelegy instead of Stiolto. Plan - sample Trelegy, CXR

## 2017-11-04 NOTE — Patient Instructions (Addendum)
Sample x 2  Trelegy       Inhale 1 puff, once daily, then rinse mouth      Try this instead of Stiolto. When the samples run out, go back to [employer] for comparison.  Order- CXR   Dx COPD mixed type   Order- office spirometry  Dx COPD mixed type  Order-  [facility]-  Please add humidity to home O2 concentrator  Ok to turn O2 up to 4 L with exertion if needed  We suggested trying otc nasal saline gel for dry nose

## 2017-11-05 ENCOUNTER — Other Ambulatory Visit: Payer: Self-pay | Admitting: *Deleted

## 2017-11-05 MED ORDER — AMOXICILLIN-POT CLAVULANATE 875-125 MG PO TABS: 1.0000 | ORAL_TABLET | Freq: Two times a day (BID) | ORAL | 0 refills | Status: DC

## 2017-12-12 ENCOUNTER — Ambulatory Visit: Payer: Medicare Other | Admitting: Family

## 2017-12-12 ENCOUNTER — Encounter (HOSPITAL_COMMUNITY): Payer: Medicare Other

## 2017-12-15 ENCOUNTER — Ambulatory Visit (INDEPENDENT_AMBULATORY_CARE_PROVIDER_SITE_OTHER): Payer: Medicare Other | Admitting: Emergency Medicine

## 2017-12-15 ENCOUNTER — Encounter: Payer: Self-pay | Admitting: Emergency Medicine

## 2017-12-15 VITALS — BP 132/68 | HR 110 | Ht 61.0 in | Wt 167.0 lb

## 2017-12-15 DIAGNOSIS — J449 Chronic obstructive pulmonary disease, unspecified: Secondary | ICD-10-CM

## 2017-12-15 MED ORDER — FLUTICASONE-UMECLIDIN-VILANT 100-62.5-25 MCG/INH IN AEPB: 1.0000 | INHALATION_SPRAY | Freq: Every day | RESPIRATORY_TRACT | 1 refills | Status: DC

## 2017-12-15 NOTE — Assessment & Plan Note (Signed)
  We will continue Trelegy, order through your pharmacy. If affordable we will continue.  Stop Stiolto for now.  Keep albuterol available 2 puffs if needed for shortness of breath.  Flu shot up to date.  Follow with Dr Lamonte Sakai in 6 months or sooner if you have any problems

## 2017-12-15 NOTE — Assessment & Plan Note (Signed)
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  No history of interstitial lung disease but she is at risk.  She had some right basilar atelectasis on her last chest x-ray.  We will follow chest x-ray, decide whether we need to perform a CT scan of her chest based on the results next time.

## 2017-12-15 NOTE — Assessment & Plan Note (Signed)
Your oxygen at 2 L/min.  She requalified today.  She is having difficulty with sores in her nose.  We have added humidity.  She is also using saline spray and saline gel.

## 2017-12-15 NOTE — Patient Instructions (Addendum)
We will plan to repeat your CXR next visit.  Please continue your oxygen at 2L/min at all times.  We will continue Trelegy, order through your pharmacy. If affordable we will continue.  Stop Stiolto for now.  Keep albuterol available 2 puffs if needed for shortness of breath.  Flu shot up to date.  Follow with Dr Lamonte Sakai in 6 months or sooner if you have any problems

## 2017-12-15 NOTE — Progress Notes (Signed)
77 yo woman with HTN, former tobacco. Dx with severe AFL and severe COPD.   ROV 01/28/17 -- follow up for severe COPD.  Also a hx SLE, psoriasis on MTX, allergic rhinitis. She reports that she has been symptomatic since January. She has AE-COPD in January characterized mainly by non-productive cough. No wheeze. She has been on [employer]. The cough has finally improved some since March, but she continues to have exertional fatigue, lightheadedness but not really dyspneia. She has a hx anemia, hasn't seen any bleeding. She rarely uses albuterol.   ROV 03/12/17 -- patient has a history of SLE/psoriasis on methotrexate, severe obstructive lung disease, allergic rhinitis. At her last visit she was having difficulty with exertional fatigue, lightheadedness. We checked CBC that showed hemoglobin 8.7. She reports that she feels better since we identified hypoxemia and started her on O2 2L/min. She is currently being weaned off MTX by her dermatologist. Did not get a CXR last time. She is on Stiolto - believes that she is doing well on it. She is using SABA rarely.   rov 07/07/17 -- patient has a history of severe obstructive lung disease, allergic rhinitis. She also has lupus/psoriasis. She's been treated in the past with methotrexate, being weaned but suspect she will not come off completely. We performed chest x-ray on 03/12/17 that I reviewed. This showed hyperinflation but no evidence of interstitial lung disease. A CT scan of the chest from 09/12/08 did not show ILD. Currently managed on Stiolto. She has albuterol available, uses it approximately once a month. She is using O2 at 2L/min. She notes that she desaturates with exertion to mid 80's on 2L/min. Needs the flu shot. No exacerbations since last time. She does have some mild cough in the morning with scant sputum production.  ROV 12/15/17 --77 year old woman with severe COPD, allergic rhinitis.  She also has lupus/psoriasis and has been treated with methotrexate.   No known evidence for interstitial lung disease on her prior imaging, but recent chest x-ray 11/04/17 with some chronic interstitial change in right basilar atelectasis.  She has exertional hypoxemia, is using 2L/min pulsed. No flares reported - no pred,    Vitals:   12/15/17 1144  BP: 132/68  Pulse: (!) 110  SpO2: (!) 84%  Weight: 167 lb (75.8 kg)  Height: 5\' 1"  (1.549 m)   Gen: Pleasant, well-nourished, in no distress,  normal affect  ENT: No lesions,  mouth clear,  oropharynx clear, no postnasal drip  Neck: No JVD, no TMG, no carotid bruits  Lungs: No use of accessory muscles, clear without rales or rhonchi  Cardiovascular: RRR, heart sounds normal, no murmur or gallops, no peripheral edema  Musculoskeletal: No deformities, no cyanosis or clubbing  Neuro: alert, non focal  Skin: Warm, no lesions or rashes    Chronic respiratory failure (HCC) Your oxygen at 2 L/min.  She requalified today.  She is having difficulty with sores in her nose.  We have added humidity.  She is also using saline spray and saline gel.  COPD (chronic obstructive pulmonary disease) (HCC)  We will continue Trelegy, order through your pharmacy. If affordable we will continue.  Stop Stiolto for now.  Keep albuterol available 2 puffs if needed for shortness of breath.  Flu shot up to date.  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  No history of interstitial lung disease but she is at risk.  She  had some right basilar atelectasis on her last chest x-ray.  We will follow chest x-ray, decide whether we need to perform a CT scan of her chest based on the results next time.   Baltazar Apo, MD, PhD 12/15/2017, 12:03 PM [facility] [phone] or if no answer [phone]

## 2017-12-29 DIAGNOSIS — L821 Other seborrheic keratosis: Secondary | ICD-10-CM | POA: Diagnosis not present

## 2017-12-29 DIAGNOSIS — L400 Psoriasis vulgaris: Secondary | ICD-10-CM | POA: Diagnosis not present

## 2017-12-29 DIAGNOSIS — Z85828 Personal history of other malignant neoplasm of skin: Secondary | ICD-10-CM | POA: Diagnosis not present

## 2017-12-29 DIAGNOSIS — Z79899 Other long term (current) drug therapy: Secondary | ICD-10-CM | POA: Diagnosis not present

## 2018-01-07 DIAGNOSIS — Z6827 Body mass index (BMI) 27.0-27.9, adult: Secondary | ICD-10-CM | POA: Diagnosis not present

## 2018-01-07 DIAGNOSIS — K644 Residual hemorrhoidal skin tags: Secondary | ICD-10-CM | POA: Diagnosis not present

## 2018-01-29 ENCOUNTER — Ambulatory Visit (INDEPENDENT_AMBULATORY_CARE_PROVIDER_SITE_OTHER): Payer: Medicare Other

## 2018-01-29 ENCOUNTER — Ambulatory Visit (INDEPENDENT_AMBULATORY_CARE_PROVIDER_SITE_OTHER): Payer: Medicare Other | Admitting: Orthopaedic Surgery

## 2018-01-29 ENCOUNTER — Encounter (INDEPENDENT_AMBULATORY_CARE_PROVIDER_SITE_OTHER): Payer: Self-pay | Admitting: Orthopaedic Surgery

## 2018-01-29 DIAGNOSIS — M1712 Unilateral primary osteoarthritis, left knee: Secondary | ICD-10-CM

## 2018-01-29 NOTE — Progress Notes (Signed)
Post-Op Visit Note   Patient: Casey Wilkinson           Date of Birth: [birth year]           MRN: [id number] Visit Date: 01/29/2018 PCP: Shon Baton, MD   Assessment & Plan:  Chief Complaint:  Chief Complaint  Patient presents with  . Left Knee - Pain, Follow-up   Visit Diagnoses:  1. Primary osteoarthritis of left knee     Plan: Patient comes in for follow-up.  2 years status post left total knee replacement, date of surgery 01/31/2016.  She has been doing excellent.  No pain unless it is raining.  She is regained full strength and motion.  Overall very pleased.  Examination of her left knee shows range of motion from 0-110 degrees.  She is stable to valgus varus stress.  Full strength with straight leg raise.  She is neurovascular intact distally.  X-rays show a well-seated prosthesis without evidence of ostial lysis or subsidence.  At this point, we are pleased with Ms. Goh progress.  She will follow-up with [place] in 1 years time for repeat evaluation and 2 view x-rays of the left knee.  Call with concerns or questions in the meantime.  Follow-Up Instructions: Return in about 1 year (around 01/30/2019).   Orders:  Orders Placed This Encounter  Procedures  . XR Knee 1-2 Views Left   No orders of the defined types were placed in this encounter.   Imaging: Xr Knee 1-2 Views Left  Result Date: 01/29/2018 X-rays of the left knee show well-seated prosthesis without evidence of subsidence or osteolysis   PMFS History: Patient Active Problem List   Diagnosis Date Noted  . Chronic rhinitis 11/04/2017  . Pain and swelling of right lower leg 05/05/2017  . Chronic respiratory failure ([place]) 03/12/2017  . Pain of right hand 01/28/2017  . 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 Degenerative arthritis of left knee 01/31/2016  . 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 Nausea with vomiting   . Acute ischemic colitis ([place])   . Rectal bleeding   . Abdominal pain   . Blood  in stool   . 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 Colitis 02/06/2015  . Abdominal mass 02/06/2015  . HTN (hypertension) 02/06/2015  . Peripheral vascular disease ([place]) 02/24/2014  . LUPUS ERYTHEMATOSUS, DISCOID 11/21/2009  . Essential hypertension 12/08/2008  . COPD (chronic obstructive pulmonary disease) ([place]) 12/08/2008   Past Medical History:  Diagnosis Date  . Anemia   . Arthritis    "all over"  . Carotid artery stenosis    right side followed by [facility]- 65%  . Colitis April 2016  . Colon cancer ([place])   . COPD (chronic obstructive pulmonary disease) ([place])   . Discoid lupus erythematosus   . Diverticulitis April 2016   and Colitis  . History of blood transfusion 01/2015, 06/2015   colectomy  . Hypertension   . Psoriatic arthritis ([place])   . Shortness of breath dyspnea    with exertion  . Squamous cell carcinoma, leg     Family History  Problem Relation Age of Onset  . Heart disease Father        Before age 68  . Hyperlipidemia Father   . Hypertension Father   . Pneumonia Mother   . Alzheimer's disease Mother   . Colon cancer Neg Hx     Past Surgical History:  Procedure Laterality Date  . APPENDECTOMY  06/2015  . CATARACT  EXTRACTION W/ INTRAOCULAR LENS  IMPLANT, BILATERAL Bilateral 2017  . COLONOSCOPY  06/23/15  . COLONOSCOPY W/ POLYPECTOMY    . CYSTOCELE REPAIR  2006  . DILATION AND CURETTAGE OF UTERUS  X 2  . EXCISIONAL HEMORRHOIDECTOMY  2006  . JOINT REPLACEMENT    . LAPAROSCOPIC PARTIAL COLECTOMY N/A 06/23/2015   Procedure: LAPAROSCOPIC ILEOCECETOMY;  Surgeon: Excell Seltzer, MD;  Location: [facility];  Service: General;  Laterality: N/A;  . MOHS SURGERY Left    "basal; LLE"  . RECTOCELE REPAIR  2006  . SQUAMOUS CELL CARCINOMA EXCISION  "several"   "legs mostly"  . TOTAL KNEE ARTHROPLASTY Left 01/31/2016   Procedure: LEFT TOTAL KNEE ARTHROPLASTY;  Surgeon: Leandrew Koyanagi, MD;  Location: [facility];  Service: Orthopedics;  Laterality: Left;  Marland Kitchen VAGINAL HYSTERECTOMY   1968   "total"   Social History   Occupational History  . Occupation: retired    Comment: Secretary/receptionist [phone]  Tobacco Use  . Smoking status: Former Smoker    Packs/day: 0.50    Years: 50.00    Pack years: 25.00    Types: Cigarettes    Last attempt to quit: 12/05/2000    Years since quitting: 17.1  . Smokeless tobacco: Never Used  . Tobacco comment: started smoking in early 20s  Substance and Sexual Activity  . Alcohol use: No    Alcohol/week: 0.0 oz  . Drug use: No  . Sexual activity: Yes

## 2018-02-05 ENCOUNTER — Other Ambulatory Visit: Payer: Self-pay

## 2018-02-05 ENCOUNTER — Encounter: Payer: Self-pay | Admitting: Family

## 2018-02-05 ENCOUNTER — Ambulatory Visit (INDEPENDENT_AMBULATORY_CARE_PROVIDER_SITE_OTHER): Payer: Medicare Other | Admitting: Family

## 2018-02-05 ENCOUNTER — Ambulatory Visit (HOSPITAL_COMMUNITY)
Admission: RE | Admit: 2018-02-05 | Discharge: 2018-02-05 | Disposition: A | Discharge status: Home / Self Care | Payer: Medicare Other | Source: Ambulatory Visit | Attending: Family | Admitting: Family

## 2018-02-05 VITALS — BP 130/74 | HR 78 | Resp 18 | Ht 61.0 in | Wt 168.0 lb

## 2018-02-05 DIAGNOSIS — I6523 Occlusion and stenosis of bilateral carotid arteries: Secondary | ICD-10-CM

## 2018-02-05 DIAGNOSIS — Z87891 Personal history of nicotine dependence: Secondary | ICD-10-CM | POA: Diagnosis not present

## 2018-02-05 DIAGNOSIS — I779 Disorder of arteries and arterioles, unspecified: Secondary | ICD-10-CM | POA: Insufficient documentation

## 2018-02-05 NOTE — Progress Notes (Signed)
[facility] HISTORY AND PHYSICAL   CC: Follow up peripheral artery disease    History of Present Illness:   Casey Wilkinson is a 77 y.o. female whom Dr. Oneida Alar has been monitoring for PAD and carotid artery stenosis.  CT scan of the neck earlier in 2015 showed a 60% right internal carotid artery stenosis with minimal stenosis left internal carotid artery.  The patient denies any history of stroke or TIA symptoms. She denies any known heart problems. She was hospitalized at [facility] for 6 days, released in April 2016, for diverticulitis and colitis, Dr. Germain Osgood is her GI.   She had a colectomy in September 2016 for colon cancer, no radiation or chemo afterward.  She had a left knee replacement April 2017 and is walking better.   She was using a stationary bike at least 30 minutes daily, and did daily seated leg exercises until she had Moh's surgery early in 2019 on her right lower leg, had significant tissue removed, and had to stay off her feet for 2 months.   Pt Diabetic: No Pt smoker: former smoker, quit in 2000  Pt meds include: Statin :Yes ASA: Yes Other anticoagulants/antiplatelets: no    Current Outpatient Medications  Medication Sig Dispense Refill  . Acetaminophen (TYLENOL ARTHRITIS PAIN PO) Take by mouth.    Marland Kitchen albuterol (PROVENTIL HFA;VENTOLIN HFA) 108 (90 BASE) MCG/ACT inhaler Inhale 2 puffs into the lungs every 6 (six) hours as needed for wheezing or shortness of breath.     Marland Kitchen aspirin EC 81 MG tablet Take 81 mg by mouth daily.    Marland Kitchen atorvastatin (LIPITOR) 10 MG tablet Take 1 tablet by mouth every morning.     . Calcium Carbonate-Vitamin D 600-400 MG-UNIT per tablet Take 1 tablet by mouth every morning.     . docusate sodium (COLACE) 100 MG capsule Take 100 mg by mouth at bedtime.     . folic acid (FOLVITE) 1 MG tablet Take 1 mg by mouth every morning.     . Loratadine 5 MG/5ML SOLN Take 10 mLs by mouth at bedtime.    . methotrexate  (RHEUMATREX) 2.5 MG tablet Take 15 mg by mouth once a week. Wednesdays    . Multiple Vitamins-Minerals (ABC PLUS SENIOR) TABS Take 1 tablet by mouth every morning.     Marland Kitchen POLY-IRON 150 150 MG capsule TK 1 C PO QD  5  . polyethylene glycol (MIRALAX / GLYCOLAX) packet Take 17 g by mouth daily as needed for mild constipation.     . Potassium Gluconate 595 MG CAPS Take 1 capsule by mouth at bedtime.     Marland Kitchen STIOLTO RESPIMAT 2.5-2.5 MCG/ACT AERS USE 2 INHALATIONS DAILY 12 g 3  . valsartan-hydrochlorothiazide (DIOVAN-HCT) 160-12.5 MG per tablet Take 1 tablet by mouth every morning.     . vitamin B-12 (CYANOCOBALAMIN) 500 MCG tablet Take 2,500 mcg by mouth every morning.     . Fluticasone-Umeclidin-Vilant (TRELEGY ELLIPTA) 100-62.5-25 MCG/INH AEPB Inhale 1 puff into the lungs daily. (Patient not taking: Reported on 02/05/2018) 180 each 1   Current Facility-Administered Medications  Medication Dose Route Frequency Provider Last Rate Last Dose  . 0.9 %  sodium chloride infusion  500 mL Intravenous Continuous Irene Shipper, MD        Past Medical History:  Diagnosis Date  . Anemia   . Arthritis    "all over"  . Carotid artery stenosis    right side followed by [facility]- 65%  .  Colitis April 2016  . Colon cancer ([place])   . COPD (chronic obstructive pulmonary disease) ([place])   . Discoid lupus erythematosus   . Diverticulitis April 2016   and Colitis  . History of blood transfusion 01/2015, 06/2015   colectomy  . Hypertension   . Psoriatic arthritis ([place])   . Shortness of breath dyspnea    with exertion  . Squamous cell carcinoma, leg     Social History Social History   Tobacco Use  . Smoking status: Former Smoker    Packs/day: 0.50    Years: 50.00    Pack years: 25.00    Types: Cigarettes    Last attempt to quit: 12/05/2000    Years since quitting: 17.1  . Smokeless tobacco: Never Used  . Tobacco comment: started smoking in early 20s  Substance Use Topics  . Alcohol use: No    Alcohol/week:  0.0 oz  . Drug use: No    Family History Family History  Problem Relation Age of Onset  . Heart disease Father        Before age 14  . Hyperlipidemia Father   . Hypertension Father   . Pneumonia Mother   . Alzheimer's disease Mother   . Colon cancer Neg Hx     Surgical History Past Surgical History:  Procedure Laterality Date  . APPENDECTOMY  06/2015  . CATARACT EXTRACTION W/ INTRAOCULAR LENS  IMPLANT, BILATERAL Bilateral 2017  . COLONOSCOPY  06/23/15  . COLONOSCOPY W/ POLYPECTOMY    . CYSTOCELE REPAIR  2006  . DILATION AND CURETTAGE OF UTERUS  X 2  . EXCISIONAL HEMORRHOIDECTOMY  2006  . JOINT REPLACEMENT    . LAPAROSCOPIC PARTIAL COLECTOMY N/A 06/23/2015   Procedure: LAPAROSCOPIC ILEOCECETOMY;  Surgeon: Excell Seltzer, MD;  Location: [facility];  Service: General;  Laterality: N/A;  . MOHS SURGERY Left    "basal; LLE"  . RECTOCELE REPAIR  2006  . SQUAMOUS CELL CARCINOMA EXCISION  "several"   "legs mostly"  . TOTAL KNEE ARTHROPLASTY Left 01/31/2016   Procedure: LEFT TOTAL KNEE ARTHROPLASTY;  Surgeon: Leandrew Koyanagi, MD;  Location: [facility];  Service: Orthopedics;  Laterality: Left;  Marland Kitchen VAGINAL HYSTERECTOMY  1968   "total"    Allergies  Allergen Reactions  . Prochlorperazine Edisylate Nausea Only and Other (See Comments)    **COMPAZINE**   Stroke-like symptoms  . Sulfamethoxazole Nausea Only and Other (See Comments)    Pt taking Methotrexate, Sulfur drugs could cause SEVERE fatal reaction.  . Doxycycline Hives    Current Outpatient Medications  Medication Sig Dispense Refill  . Acetaminophen (TYLENOL ARTHRITIS PAIN PO) Take by mouth.    Marland Kitchen albuterol (PROVENTIL HFA;VENTOLIN HFA) 108 (90 BASE) MCG/ACT inhaler Inhale 2 puffs into the lungs every 6 (six) hours as needed for wheezing or shortness of breath.     Marland Kitchen aspirin EC 81 MG tablet Take 81 mg by mouth daily.    Marland Kitchen atorvastatin (LIPITOR) 10 MG tablet Take 1 tablet by mouth every morning.     . Calcium Carbonate-Vitamin D 600-400  MG-UNIT per tablet Take 1 tablet by mouth every morning.     . docusate sodium (COLACE) 100 MG capsule Take 100 mg by mouth at bedtime.     . folic acid (FOLVITE) 1 MG tablet Take 1 mg by mouth every morning.     . Loratadine 5 MG/5ML SOLN Take 10 mLs by mouth at bedtime.    . methotrexate (RHEUMATREX) 2.5 MG tablet Take 15 mg by  mouth once a week. Wednesdays    . Multiple Vitamins-Minerals (ABC PLUS SENIOR) TABS Take 1 tablet by mouth every morning.     Marland Kitchen POLY-IRON 150 150 MG capsule TK 1 C PO QD  5  . polyethylene glycol (MIRALAX / GLYCOLAX) packet Take 17 g by mouth daily as needed for mild constipation.     . Potassium Gluconate 595 MG CAPS Take 1 capsule by mouth at bedtime.     Marland Kitchen STIOLTO RESPIMAT 2.5-2.5 MCG/ACT AERS USE 2 INHALATIONS DAILY 12 g 3  . valsartan-hydrochlorothiazide (DIOVAN-HCT) 160-12.5 MG per tablet Take 1 tablet by mouth every morning.     . vitamin B-12 (CYANOCOBALAMIN) 500 MCG tablet Take 2,500 mcg by mouth every morning.     . Fluticasone-Umeclidin-Vilant (TRELEGY ELLIPTA) 100-62.5-25 MCG/INH AEPB Inhale 1 puff into the lungs daily. (Patient not taking: Reported on 02/05/2018) 180 each 1   Current Facility-Administered Medications  Medication Dose Route Frequency Provider Last Rate Last Dose  . 0.9 %  sodium chloride infusion  500 mL Intravenous Continuous Irene Shipper, MD         REVIEW OF SYSTEMS: See HPI for pertinent positives and negatives.  Physical Examination Vitals:   02/05/18 1350  BP: 130/74  Pulse: 78  Resp: 18  SpO2: 94%  Weight: 168 lb (76.2 kg)  Height: 5\' 1"  (1.549 m)   Body mass index is 31.74 kg/m.  General: WDWN obese female in NAD Gait: Normal HENT: WNL Eyes: PERRLA Pulmonary: Slightly abored breathing at rest, CTAB, fair air movement in all fields, using supplemental O2 via [place] Cardiac: RRR, no murmur detected Abdomen: soft, NT, no masses palpated Skin: no rashes, no ulcers, no cellulitis.  VASCULAR EXAM  Carotid Bruits  Right Left   Negative Negative   radial pulses are 2+ palpable and = Abdominal aortic pulse is not palpable   VASCULAR EXAM: Extremitieswithout ischemic changes  without Gangrene; without open wounds.     LE Pulses Right Left   FEMORAL 1+ palpable 1+ palpable    POPLITEAL not palpable  not palpable   POSTERIOR TIBIAL not palpable  not palpable    DORSALIS PEDIS  ANTERIOR TIBIAL faintlypalpable  notpalpable     Musculoskeletal: no muscle wasting or atrophy; no peripheral edema Neurologic:  A&O X 3; appropriate affect, sensation is normal; speech is normal, CN 2-12 intact, pain and light touch intact in extremities, motor exam as listed above. Psychiatric: Normal thought content, mood appropriate to clinical situation.    ASSESSMENT:  Casey Wilkinson is a 77 y.o. female with stable minimalbilateral ICA stenosis and a history of mild peripheral artery occlusive disease. She has no history of stroke or TIA.  She had to stay off her feet for 2 months recently after [facility] surgery to her right lower leg, was riding a stationary bike daily, and daily seated leg exercises prior to this. Her ABI's have declined, likely since she had not been walking much for two months.  She has no claudication symptoms with walking. There are no signs of ischemia in her feet/legs.   Her atherosclerotic risk factors include former smoker and history of colon cancer. Fortunately she does not have DM.  She takes a daily ASA and statin.  DATA   ABI (Date: 02/05/2018):  R:   ABI: 0.57 (was 0.86 on 12-12-16),   PT: waveform morphology not documented  DP: waveform morphology not documented   TBI:  0.26 (was (0.57)   L:   ABI: 0.67 (was  0.79),   PT: waveform morphology  not documented  DP: waveform morphology not documented  TBI: 0.31 (was 0.58)    CT scan of the neck earlier in 2015 showed a 60% right internal carotid artery stenosis with minimal stenosis left internal carotid artery.  Since the above CT scan, threeserial carotid Duplex have demonstrated <40% bilateral ICA stenoses.     Plan:  Resume graduated walking program and daily seated leg exercises.   Follow-up in 6 monthswith ABI's and Carotid Duplex scan.  I advised her to notify us if she develops concerns re the circulation in her feet/legs.    I discussed in depth with the patient the nature of atherosclerosis, and emphasized the importance of maximal medical management including strict control of blood pressure, blood glucose, and lipid levels, obtaining regular exercise, and cessation of smoking.  The patient is aware that without maximal medical management the underlying atherosclerotic disease process will progress, limiting the benefit of any interventions.  The patient was given information about stroke prevention and what symptoms should prompt the patient to seek immediate medical care.  The patient was given information about PAD including signs, symptoms, treatment, what symptoms should prompt the patient to seek immediate medical care, and risk reduction measures to take.  Thank you for allowing [place] to participate in this patient's care.  Clemon Chambers, RN, MSN, FNP-C [facility] Office: [phone]  Clinic MD: Oneida Alar 02/05/2018 2:11 PM

## 2018-02-05 NOTE — Patient Instructions (Signed)
Peripheral Vascular Disease Peripheral vascular disease (PVD) is a disease of the blood vessels that are not part of your heart and brain. A simple term for PVD is poor circulation. In most cases, PVD narrows the blood vessels that carry blood from your heart to the rest of your body. This can result in a decreased supply of blood to your arms, legs, and internal organs, like your stomach or kidneys. However, it most often affects a person's lower legs and feet. There are two types of PVD.  Organic PVD. This is the more common type. It is caused by damage to the structure of blood vessels.  Functional PVD. This is caused by conditions that make blood vessels contract and tighten (spasm).  Without treatment, PVD tends to get worse over time. PVD can also lead to acute ischemic limb. This is when an arm or limb suddenly has trouble getting enough blood. This is a medical emergency. Follow these instructions at home:  Take medicines only as told by your doctor.  Do not use any tobacco products, including cigarettes, chewing tobacco, or electronic cigarettes. If you need help quitting, ask your doctor.  Lose weight if you are overweight, and maintain a healthy weight as told by your doctor.  Eat a diet that is low in fat and cholesterol. If you need help, ask your doctor.  Exercise regularly. Ask your doctor for some good activities for you.  Take good care of your feet. ? Wear comfortable shoes that fit well. ? Check your feet often for any cuts or sores. Contact a doctor if:  You have cramps in your legs while walking.  You have leg pain when you are at rest.  You have coldness in a leg or foot.  Your skin changes.  You are unable to get or have an erection (erectile dysfunction).  You have cuts or sores on your feet that are not healing. Get help right away if:  Your arm or leg turns cold and blue.  Your arms or legs become red, warm, swollen, painful, or numb.  You have  chest pain or trouble breathing.  You suddenly have weakness in your face, arm, or leg.  You become very confused or you cannot speak.  You suddenly have a very bad headache.  You suddenly cannot see. This information is not intended to replace advice given to you by your health care provider. Make sure you discuss any questions you have with your health care provider. Document Released: 01/01/2010 Document Revised: 03/14/2016 Document Reviewed: 03/17/2014 Elsevier Interactive Patient Education  2017 Elsevier Inc.       Stroke Prevention Some health problems and behaviors may make it more likely for you to have a stroke. Below are ways to lessen your risk of having a stroke.  Be active for at least 30 minutes on most or all days.  Do not smoke. Try not to be around others who smoke.  Do not drink too much alcohol. ? Do not have more than 2 drinks a day if you are a man. ? Do not have more than 1 drink a day if you are a woman and are not pregnant.  Eat healthy foods, such as fruits and vegetables. If you were put on a specific diet, follow the diet as told.  Keep your cholesterol levels under control through diet and medicines. Look for foods that are low in saturated fat, trans fat, cholesterol, and are high in fiber.  If you have diabetes, follow   all diet plans and take your medicine as told.  Ask your doctor if you need treatment to lower your blood pressure. If you have high blood pressure (hypertension), follow all diet plans and take your medicine as told by your doctor.  If you are 18-39 years old, have your blood pressure checked every 3-5 years. If you are age 40 or older, have your blood pressure checked every year.  Keep a healthy weight. Eat foods that are low in calories, salt, saturated fat, trans fat, and cholesterol.  Do not take drugs.  Avoid birth control pills, if this applies. Talk to your doctor about the risks of taking birth control pills.  Talk to  your doctor if you have sleep problems (sleep apnea).  Take all medicine as told by your doctor. ? You may be told to take aspirin or blood thinner medicine. Take this medicine as told by your doctor. ? Understand your medicine instructions.  Make sure any other conditions you have are being taken care of.  Get help right away if:  You suddenly lose feeling (you feel numb) or have weakness in your face, arm, or leg.  Your face or eyelid hangs down to one side.  You suddenly feel confused.  You have trouble talking (aphasia) or understanding what people are saying.  You suddenly have trouble seeing in one or both eyes.  You suddenly have trouble walking.  You are dizzy.  You lose your balance or your movements are clumsy (uncoordinated).  You suddenly have a very bad headache and you do not know the cause.  You have new chest pain.  Your heart feels like it is fluttering or skipping a beat (irregular heartbeat). Do not wait to see if the symptoms above go away. Get help right away. Call your local emergency services (911 in [place] Do not drive yourself to the hospital. This information is not intended to replace advice given to you by your health care provider. Make sure you discuss any questions you have with your health care provider. Document Released: 04/07/2012 Document Revised: 03/14/2016 Document Reviewed: 04/09/2013 Elsevier Interactive Patient Education  2018 Elsevier Inc.  

## 2018-02-23 DIAGNOSIS — L710 Perioral dermatitis: Secondary | ICD-10-CM | POA: Diagnosis not present

## 2018-02-23 DIAGNOSIS — C44722 Squamous cell carcinoma of skin of right lower limb, including hip: Secondary | ICD-10-CM | POA: Diagnosis not present

## 2018-02-23 DIAGNOSIS — L400 Psoriasis vulgaris: Secondary | ICD-10-CM | POA: Diagnosis not present

## 2018-02-23 DIAGNOSIS — Z85828 Personal history of other malignant neoplasm of skin: Secondary | ICD-10-CM | POA: Diagnosis not present

## 2018-02-23 DIAGNOSIS — L720 Epidermal cyst: Secondary | ICD-10-CM | POA: Diagnosis not present

## 2018-02-23 DIAGNOSIS — D485 Neoplasm of uncertain behavior of skin: Secondary | ICD-10-CM | POA: Diagnosis not present

## 2018-02-23 DIAGNOSIS — Z79899 Other long term (current) drug therapy: Secondary | ICD-10-CM | POA: Diagnosis not present

## 2018-02-26 ENCOUNTER — Other Ambulatory Visit: Payer: Self-pay | Admitting: Internal Medicine

## 2018-02-26 ENCOUNTER — Other Ambulatory Visit: Payer: Self-pay | Admitting: Emergency Medicine

## 2018-02-26 ENCOUNTER — Ambulatory Visit
Admission: RE | Admit: 2018-02-26 | Discharge: 2018-02-26 | Disposition: A | Discharge status: Home / Self Care | Payer: Medicare Other | Source: Ambulatory Visit | Attending: Internal Medicine | Admitting: Internal Medicine

## 2018-02-26 DIAGNOSIS — R05 Cough: Secondary | ICD-10-CM | POA: Diagnosis not present

## 2018-02-26 DIAGNOSIS — J181 Lobar pneumonia, unspecified organism: Secondary | ICD-10-CM | POA: Diagnosis not present

## 2018-02-26 DIAGNOSIS — J90 Pleural effusion, not elsewhere classified: Secondary | ICD-10-CM

## 2018-02-26 DIAGNOSIS — Z6827 Body mass index (BMI) 27.0-27.9, adult: Secondary | ICD-10-CM | POA: Diagnosis not present

## 2018-02-26 DIAGNOSIS — R918 Other nonspecific abnormal finding of lung field: Secondary | ICD-10-CM | POA: Diagnosis not present

## 2018-03-02 ENCOUNTER — Other Ambulatory Visit: Payer: Self-pay

## 2018-03-02 DIAGNOSIS — I6523 Occlusion and stenosis of bilateral carotid arteries: Secondary | ICD-10-CM

## 2018-03-02 DIAGNOSIS — I779 Disorder of arteries and arterioles, unspecified: Secondary | ICD-10-CM

## 2018-03-17 ENCOUNTER — Ambulatory Visit
Admission: RE | Admit: 2018-03-17 | Discharge: 2018-03-17 | Disposition: A | Discharge status: Home / Self Care | Payer: Medicare Other | Source: Ambulatory Visit | Attending: Internal Medicine | Admitting: Internal Medicine

## 2018-03-17 ENCOUNTER — Other Ambulatory Visit: Payer: Self-pay | Admitting: Internal Medicine

## 2018-03-17 DIAGNOSIS — J90 Pleural effusion, not elsewhere classified: Secondary | ICD-10-CM

## 2018-03-17 DIAGNOSIS — J189 Pneumonia, unspecified organism: Secondary | ICD-10-CM

## 2018-03-17 DIAGNOSIS — J181 Lobar pneumonia, unspecified organism: Secondary | ICD-10-CM | POA: Diagnosis not present

## 2018-03-17 MED ORDER — IOPAMIDOL (ISOVUE-300) INJECTION 61%
75.0000 mL | Freq: Once | INTRAVENOUS | Status: AC | PRN
  Administered 2018-03-17: 75 mL via INTRAVENOUS

## 2018-03-18 ENCOUNTER — Telehealth: Payer: Self-pay | Admitting: Emergency Medicine

## 2018-03-18 DIAGNOSIS — I10 Essential (primary) hypertension: Secondary | ICD-10-CM | POA: Diagnosis not present

## 2018-03-18 NOTE — Telephone Encounter (Signed)
Forwarding to [place]

## 2018-03-19 ENCOUNTER — Other Ambulatory Visit: Payer: Self-pay | Admitting: Internal Medicine

## 2018-03-19 DIAGNOSIS — J690 Pneumonitis due to inhalation of food and vomit: Secondary | ICD-10-CM

## 2018-03-19 NOTE — Telephone Encounter (Signed)
I reviewed the course with Dr Virgina Jock. Have reviewed the CT scan together. She hsa RLL consolidation with a possible evolving basilar abscess. I believe she will need a prolonged abx course. He has started cefdinir + azithro. I will see her in office, assess response. She may need augmentin if we suspect anaerobic process. If no better may need to admit for IV abx. She may merit FOB if the bleeding continues or if she fails to resolve.   Please set her up for an OV with me.

## 2018-03-19 NOTE — Telephone Encounter (Signed)
RB, you do not have anything available until 6/28. Is it ok to double book her?

## 2018-03-23 NOTE — Telephone Encounter (Signed)
I guess we have to find good places to [place]

## 2018-03-24 ENCOUNTER — Ambulatory Visit: Payer: Medicare Other | Admitting: Adult Health

## 2018-03-24 NOTE — Telephone Encounter (Signed)
Spoke with pt. She has been scheduled with Dr. Lamonte Sakai on 03/31/18 at 1:30pm. Advised pt to call us if she needs [place] before then. Nothing further was needed.

## 2018-03-31 ENCOUNTER — Ambulatory Visit (INDEPENDENT_AMBULATORY_CARE_PROVIDER_SITE_OTHER): Payer: Medicare Other | Admitting: Emergency Medicine

## 2018-03-31 ENCOUNTER — Encounter: Payer: Self-pay | Admitting: Emergency Medicine

## 2018-03-31 DIAGNOSIS — J181 Lobar pneumonia, unspecified organism: Secondary | ICD-10-CM

## 2018-03-31 DIAGNOSIS — J449 Chronic obstructive pulmonary disease, unspecified: Secondary | ICD-10-CM

## 2018-03-31 DIAGNOSIS — I779 Disorder of arteries and arterioles, unspecified: Secondary | ICD-10-CM | POA: Diagnosis not present

## 2018-03-31 DIAGNOSIS — J189 Pneumonia, unspecified organism: Secondary | ICD-10-CM

## 2018-03-31 MED ORDER — ALBUTEROL SULFATE HFA 108 (90 BASE) MCG/ACT IN AERS: 2.0000 | INHALATION_SPRAY | RESPIRATORY_TRACT | 1 refills | Status: AC | PRN

## 2018-03-31 MED ORDER — AMOXICILLIN-POT CLAVULANATE 875-125 MG PO TABS: 1.0000 | ORAL_TABLET | Freq: Two times a day (BID) | ORAL | 0 refills | Status: DC

## 2018-03-31 NOTE — Patient Instructions (Addendum)
Please start Augmentin 875mg  twice a day for the next 3 weeks.  We will perform a repeat CT chest in 3 weeks to compare Please continue your Trelegy once a day as you are taking it. Rinse and gargle after using.  Take albuterol 2 puffs up to every 4 hours if needed for shortness of breath.  Continue your oxygen at 2L/min.  Follow with Dr Lamonte Sakai in 1 month

## 2018-03-31 NOTE — Assessment & Plan Note (Signed)
Improving some with treatment of her pneumonia.  Still quite limited.  She is on oxygen 247.  Continue her Trelegy as ordered.  She rarely uses albuterol

## 2018-03-31 NOTE — Assessment & Plan Note (Signed)
With possible evolving abscess on CT scan of the chest.  She has dense consolidation, clearly a severe pneumonia.  She has been on methotrexate, is immunosuppressed.  I believe she needs a longer course of antibiotics and I like to cover anaerobes with Augmentin.  We will do this for 3 more weeks and then repeat her CT scan of the chest to see if there is been any interval clearance or if we can identify a true abscess forming.  Depending on the results of the CT she may need bronchoscopy, she may need a swallowing evaluation to rule out aspiration to the right lower lobe.

## 2018-03-31 NOTE — Progress Notes (Signed)
77 yo woman with HTN, former tobacco. Dx with severe AFL and severe COPD.   ROV 01/28/17 -- follow up for severe COPD.  Also a hx SLE, psoriasis on MTX, allergic rhinitis. She reports that she has been symptomatic since January. She has AE-COPD in January characterized mainly by non-productive cough. No wheeze. She has been on [employer]. The cough has finally improved some since March, but she continues to have exertional fatigue, lightheadedness but not really dyspneia. She has a hx anemia, hasn't seen any bleeding. She rarely uses albuterol.   ROV 03/12/17 -- patient has a history of SLE/psoriasis on methotrexate, severe obstructive lung disease, allergic rhinitis. At her last visit she was having difficulty with exertional fatigue, lightheadedness. We checked CBC that showed hemoglobin 8.7. She reports that she feels better since we identified hypoxemia and started her on O2 2L/min. She is currently being weaned off MTX by her dermatologist. Did not get a CXR last time. She is on Stiolto - believes that she is doing well on it. She is using SABA rarely.   rov 07/07/17 -- patient has a history of severe obstructive lung disease, allergic rhinitis. She also has lupus/psoriasis. She's been treated in the past with methotrexate, being weaned but suspect she will not come off completely. We performed chest x-ray on 03/12/17 that I reviewed. This showed hyperinflation but no evidence of interstitial lung disease. A CT scan of the chest from 09/12/08 did not show ILD. Currently managed on Stiolto. She has albuterol available, uses it approximately once a month. She is using O2 at 2L/min. She notes that she desaturates with exertion to mid 80's on 2L/min. Needs the flu shot. No exacerbations since last time. She does have some mild cough in the morning with scant sputum production.  ROV 12/15/17 --77 year old woman with severe COPD, allergic rhinitis.  She also has lupus/psoriasis and has been treated with methotrexate.   No known evidence for interstitial lung disease on her prior imaging, but recent chest x-ray 11/04/17 with some chronic interstitial change in right basilar atelectasis.  She has exertional hypoxemia, is using 2L/min pulsed. No flares reported - no pred,   ROV 03/31/18 --Mrs. Segers is a 77 year old woman with a history of severe COPD and allergic rhinitis, chronic hypoxemia on 2L/min w exertion.   She also is on immunosuppression for lupus and psoriasis.  She has been following with Dr. Virgina Jock in dealing with a complicated right lower lobe pneumonia beginning a month ago, failed to fully resolve after antibiotics. A Ct scan 5/28 shows RLL consolidation and an rounded area concerning for abscess. Cefdinir + azithro started, she is about to complete. May be slightly better.  She has less cough, less blood in her mucous. Her MTX was held with this illness   Vitals:   03/31/18 1321  BP: 116/64  Pulse: 88  SpO2: 92%  Weight: 163 lb (73.9 kg)  Height: 5\' 5"  (1.651 m)   Gen: Pleasant, well-nourished, in no distress,  normal affect  ENT: No lesions,  mouth clear,  oropharynx clear, no postnasal drip  Neck: No JVD, no stridor  Lungs: No use of accessory muscles, decreased breath sounds right base  Cardiovascular: RRR, heart sounds normal, no murmur or gallops, no peripheral edema  Musculoskeletal: No deformities, no cyanosis or clubbing  Neuro: alert, non focal  Skin: Warm, no lesions or rashes    COPD (chronic obstructive pulmonary disease) (HCC) Improving some with treatment of her pneumonia.  Still quite limited.  She  is on oxygen 247.  Continue her Trelegy as ordered.  She rarely uses albuterol  Right lower lobe pneumonia ([place]) With possible evolving abscess on CT scan of the chest.  She has dense consolidation, clearly a severe pneumonia.  She has been on methotrexate, is immunosuppressed.  I believe she needs a longer course of antibiotics and I like to cover anaerobes with Augmentin.   We will do this for 3 more weeks and then repeat her CT scan of the chest to see if there is been any interval clearance or if we can identify a true abscess forming.  Depending on the results of the CT she may need bronchoscopy, she may need a swallowing evaluation to rule out aspiration to the right lower lobe.   Baltazar Apo, MD, PhD 03/31/2018, 1:39 PM [facility] [phone] or if no answer [phone]

## 2018-04-01 DIAGNOSIS — J90 Pleural effusion, not elsewhere classified: Secondary | ICD-10-CM | POA: Diagnosis not present

## 2018-04-01 DIAGNOSIS — I10 Essential (primary) hypertension: Secondary | ICD-10-CM | POA: Diagnosis not present

## 2018-04-22 ENCOUNTER — Ambulatory Visit
Admission: RE | Admit: 2018-04-22 | Discharge: 2018-04-22 | Disposition: A | Discharge status: Home / Self Care | Payer: Medicare Other | Source: Ambulatory Visit | Attending: Emergency Medicine | Admitting: Emergency Medicine

## 2018-04-22 DIAGNOSIS — R918 Other nonspecific abnormal finding of lung field: Secondary | ICD-10-CM | POA: Diagnosis not present

## 2018-04-22 DIAGNOSIS — J181 Lobar pneumonia, unspecified organism: Principal | ICD-10-CM

## 2018-04-22 DIAGNOSIS — J189 Pneumonia, unspecified organism: Secondary | ICD-10-CM

## 2018-04-28 ENCOUNTER — Telehealth: Payer: Self-pay | Admitting: Emergency Medicine

## 2018-04-28 NOTE — Telephone Encounter (Signed)
Called and spoke with pt letting her know that [place] has been out of the office and stated to her that once he reviews the CT scan, we would call her to let her know what the results are.  Pt expressed understanding.  Dr. Lamonte Sakai, please advise on results of pt's CT. Thanks!

## 2018-04-28 NOTE — Telephone Encounter (Signed)
Please let her know that I reviewed her CT chest, compared it with her prior. There has been no significant change in the right lower lobe - still looks like an area of dense PNA, possibly some associated scar. I would like to review with her at the office, talk about whether there is any other testing that we should do to evaluate. Specifically, we talked at her last visit about a possible bronchoscopy.

## 2018-04-28 NOTE — Telephone Encounter (Signed)
Called and spoke with pt letting her know the results of the ct scan.  Pt expressed understanding. Pt does have an upcoming OV with RB 05/13/18. Stated to pt RB will go over CT in more detail and then see what needs to happen next at that visit.  Pt expressed understanding. Nothing further needed.

## 2018-04-29 DIAGNOSIS — D6489 Other specified anemias: Secondary | ICD-10-CM | POA: Diagnosis not present

## 2018-04-30 ENCOUNTER — Other Ambulatory Visit (INDEPENDENT_AMBULATORY_CARE_PROVIDER_SITE_OTHER): Payer: Medicare Other

## 2018-04-30 ENCOUNTER — Telehealth: Payer: Self-pay | Admitting: Emergency Medicine

## 2018-04-30 ENCOUNTER — Ambulatory Visit (INDEPENDENT_AMBULATORY_CARE_PROVIDER_SITE_OTHER): Payer: Medicare Other | Admitting: Primary Care

## 2018-04-30 ENCOUNTER — Ambulatory Visit (INDEPENDENT_AMBULATORY_CARE_PROVIDER_SITE_OTHER)
Admission: RE | Admit: 2018-04-30 | Discharge: 2018-04-30 | Disposition: A | Discharge status: Home / Self Care | Payer: Medicare Other | Source: Ambulatory Visit | Attending: Primary Care | Admitting: Primary Care

## 2018-04-30 ENCOUNTER — Encounter: Payer: Self-pay | Admitting: Primary Care

## 2018-04-30 VITALS — BP 108/58 | HR 88 | Temp 98.3°F | Ht 65.0 in | Wt 164.6 lb

## 2018-04-30 DIAGNOSIS — J189 Pneumonia, unspecified organism: Secondary | ICD-10-CM

## 2018-04-30 DIAGNOSIS — R042 Hemoptysis: Secondary | ICD-10-CM | POA: Diagnosis not present

## 2018-04-30 DIAGNOSIS — R0781 Pleurodynia: Secondary | ICD-10-CM | POA: Insufficient documentation

## 2018-04-30 DIAGNOSIS — J181 Lobar pneumonia, unspecified organism: Secondary | ICD-10-CM

## 2018-04-30 DIAGNOSIS — L930 Discoid lupus erythematosus: Secondary | ICD-10-CM | POA: Diagnosis not present

## 2018-04-30 LAB — BASIC METABOLIC PANEL
BUN: 20 mg/dL (ref 6–23)
CHLORIDE: 100 meq/L (ref 96–112)
CO2: 29 meq/L (ref 19–32)
Calcium: 9.6 mg/dL (ref 8.4–10.5)
Creatinine, Ser: 0.88 mg/dL (ref 0.40–1.20)
GFR: 65.99 mL/min (ref >60.00)
GLUCOSE: 95 mg/dL (ref 70–99)
POTASSIUM: 4.2 meq/L (ref 3.5–5.1)
Sodium: 136 mEq/L (ref 135–145)

## 2018-04-30 LAB — CBC WITH DIFFERENTIAL/PLATELET
BASOS PCT: 0.3 % (ref 0.0–3.0)
Basophils Absolute: 0 10*3/uL (ref 0.0–0.1)
EOS PCT: 0.3 % (ref 0.0–5.0)
Eosinophils Absolute: 0 10*3/uL (ref 0.0–0.7)
HEMATOCRIT: 28.6 % — AB (ref 36.0–46.0)
HEMOGLOBIN: 9.5 g/dL — AB (ref 12.0–15.0)
Lymphs Abs: 0.7 10*3/uL (ref 0.7–4.0)
MCHC: 33.4 g/dL (ref 30.0–36.0)
MCV: 90.9 fl (ref 78.0–100.0)
MONOS PCT: 6.9 % (ref 3.0–12.0)
Monocytes Absolute: 0.8 10*3/uL (ref 0.1–1.0)
Neutro Abs: 10 10*3/uL — ABNORMAL HIGH (ref 1.4–7.7)
Neutrophils Relative %: 86.6 % — ABNORMAL HIGH (ref 43.0–77.0)
Platelets: 213 10*3/uL (ref 150.0–400.0)
RBC: 3.14 Mil/uL — AB (ref 3.87–5.11)
RDW: 14.7 % (ref 11.5–15.5)
WBC: 11.6 10*3/uL — ABNORMAL HIGH (ref 4.0–10.5)

## 2018-04-30 LAB — PROTIME-INR
INR: 1.1 ratio — ABNORMAL HIGH (ref 0.8–1.0)
Prothrombin Time: 12.5 s (ref 9.6–13.1)

## 2018-04-30 MED ORDER — HYDROCODONE-HOMATROPINE 5-1.5 MG/5ML PO SYRP: 5.0000 mL | ORAL_SOLUTION | Freq: Every evening | ORAL | 0 refills | Status: DC | PRN

## 2018-04-30 NOTE — H&P (View-Only) (Signed)
@Patient  ID: Casey Wilkinson, female    DOB: [birth year], 77 y.o.   MRN: [id number]  Chief Complaint  Patient presents with  . Acute Visit    Pt states when she went to bed last night, 7/10 she began to have some pain in her right side and had also some increased SOB. Pt also has a cough which she is coughing up bloody mucus. Pt states she has had this pain on her side and has had pna.    Referring provider: Shon Baton, MD  HPI: 77 year old female, former smoker (quit 2002). Hx COPD, HTN, chronic rhinitis, lupus on methotrexate. Patient of Dr. Lamonte Sakai, most recently seen on 03/31/18.   Recent RLL PNA treated with cefdinir, azithro and augment x3 weeks. No improvement in RLL consolidation on most recent CT 04/22/18. Per Dr. Agustina Caroli note, consider bronchoscopy for further eval.   Test: CT chest 04/22/18-  No substantial interval change in the dense right lower lobe consolidative disease. CT chest 03/17/18- Right lower lobe consolidation consistent with pneumonia. A rounded region of lower attenuation containing abnormal air is most consistent with a developing lung abscess.  04/30/2018 Presents today with complaints of severe right side pain last night when lying in bed. Rating pain 10/10, took tylenol arthritis and was able to get some rest. Patient states that she pushed herself yesterday and did more activity than usual. She went shopping for new sheets and had dinner at the [employer]. States that she is more sore today than anything. Continues to have dyspnea on exertion and hemoptysis, coughing up roughly a teaspoon of blood 10 times a day. No fever, shortness of breath at rest or wheezing. Methotrexate is on hold. Continues 2L [place] oxygen.   Allergies  Allergen Reactions  . Prochlorperazine Edisylate Nausea Only and Other (See Comments)    **COMPAZINE**   Stroke-like symptoms  . Sulfamethoxazole Nausea Only and Other (See Comments)    Pt taking Methotrexate, Sulfur drugs could  cause SEVERE fatal reaction.  . Doxycycline Hives    Immunization History  Administered Date(s) Administered  . Influenza Split 07/21/2012, 07/21/2017  . Influenza Whole 07/05/2009, 103-05-202011, 07/06/2011  . Influenza,inj,Quad PF,6+ Mos 07/13/2013, 07/14/2014, 06/26/2015, 07/16/2016  . Pneumococcal Polysaccharide-23 04/19/2005, 103-05-202011  . Pneumococcal-Unspecified 07/08/2014    Past Medical History:  Diagnosis Date  . Anemia   . Arthritis    "all over"  . Carotid artery stenosis    right side followed by [facility]- 65%  . Colitis April 2016  . Colon cancer ([place])   . COPD (chronic obstructive pulmonary disease) ([place])   . Discoid lupus erythematosus   . Diverticulitis April 2016   and Colitis  . History of blood transfusion 01/2015, 06/2015   colectomy  . Hypertension   . Psoriatic arthritis ([place])   . Shortness of breath dyspnea    with exertion  . Squamous cell carcinoma, leg     Tobacco History: Social History   Tobacco Use  Smoking Status Former Smoker  . Packs/day: 0.50  . Years: 50.00  . Pack years: 25.00  . Types: Cigarettes  . Last attempt to quit: 12/05/2000  . Years since quitting: 17.4  Smokeless Tobacco Never Used  Tobacco Comment   started smoking in early 59s   Counseling given: Not Answered Comment: started smoking in early 20s   Outpatient Medications Prior to Visit  Medication Sig Dispense Refill  . Acetaminophen (TYLENOL ARTHRITIS PAIN PO) Take by mouth.    Marland Kitchen albuterol (PROAIR  HFA) 108 (90 Base) MCG/ACT inhaler Inhale 2 puffs into the lungs every 4 (four) hours as needed for wheezing or shortness of breath. 3 Inhaler 1  . atorvastatin (LIPITOR) 10 MG tablet Take 1 tablet by mouth every morning.     . Calcium Carbonate-Vitamin D 600-400 MG-UNIT per tablet Take 1 tablet by mouth every morning.     . docusate sodium (COLACE) 100 MG capsule Take 100 mg by mouth at bedtime.     . folic acid (FOLVITE) 1 MG tablet Take 1 mg by mouth every morning.     .  Loratadine 5 MG/5ML SOLN Take 10 mLs by mouth at bedtime.    . Multiple Vitamins-Minerals (ABC PLUS SENIOR) TABS Take 1 tablet by mouth every morning.     Marland Kitchen POLY-IRON 150 150 MG capsule TK 1 C PO QD  5  . polyethylene glycol (MIRALAX / GLYCOLAX) packet Take 17 g by mouth daily as needed for mild constipation.     . Potassium Gluconate 595 MG CAPS Take 1 capsule by mouth at bedtime.     . TRELEGY ELLIPTA 100-62.5-25 MCG/INH AEPB USE 1 INHALATION DAILY 180 each 1  . valsartan-hydrochlorothiazide (DIOVAN-HCT) 160-12.5 MG per tablet Take 1 tablet by mouth every morning.     . vitamin B-12 (CYANOCOBALAMIN) 500 MCG tablet Take 2,500 mcg by mouth every morning.     Marland Kitchen aspirin EC 81 MG tablet Take 81 mg by mouth daily.    . methotrexate (RHEUMATREX) 2.5 MG tablet Take 15 mg by mouth once a week. Wednesdays    . amoxicillin-clavulanate (AUGMENTIN) 875-125 MG tablet Take 1 tablet by mouth 2 (two) times daily. 42 tablet 0   Facility-Administered Medications Prior to Visit  Medication Dose Route Frequency Provider Last Rate Last Dose  . 0.9 %  sodium chloride infusion  500 mL Intravenous Continuous Irene Shipper, MD          Review of Systems  Review of Systems  Constitutional: Negative for fever.  Respiratory: Positive for cough, chest tightness, shortness of breath and wheezing.   Cardiovascular: Negative for chest pain.     Physical Exam  BP (!) 108/58 (BP Location: Left Arm, Cuff Size: Normal)   Pulse 88   Temp 98.3 F (36.8 C) (Oral)   Ht 5\' 5"  (1.651 m)   Wt 164 lb 9.6 oz (74.7 kg)   SpO2 95%   BMI 27.39 kg/m  Physical Exam  Constitutional: She is oriented to person, place, and time. She appears well-developed and well-nourished.  HENT:  Head: Normocephalic and atraumatic.  Right Ear: Tympanic membrane and ear canal normal.  Left Ear: Tympanic membrane and ear canal normal.  Nose: Nose normal.  Mouth/Throat: Uvula is midline and oropharynx is clear and moist.  Eyes:  Conjunctivae and lids are normal.  Neck: Normal range of motion. Neck supple.  Cardiovascular: Normal rate and regular rhythm.  Pulmonary/Chest: Accessory muscle usage present. No tachypnea. No respiratory distress. She has decreased breath sounds. She has no wheezes. She has no rhonchi.  Lymphadenopathy:       Head (right side): No submandibular, no tonsillar, no preauricular and no posterior auricular adenopathy present.       Head (left side): No submandibular, no tonsillar, no preauricular and no posterior auricular adenopathy present.    She has no cervical adenopathy.  Neurological: She is alert and oriented to person, place, and time.  Skin: Skin is warm, dry and intact.  Psychiatric: She has a normal mood  and affect. Her speech is normal and behavior is normal. Judgment and thought content normal. Cognition and memory are normal.     Lab Results:  CBC    Component Value Date/Time   WBC 7.0 01/28/2017 1648   RBC 2.79 (L) 01/28/2017 1648   HGB 8.7 Repeated and verified X2. (L) 01/28/2017 1648   HCT 26.2 Repeated and verified X2. (L) 01/28/2017 1648   PLT 158.0 01/28/2017 1648   MCV 93.9 01/28/2017 1648   MCH 30.0 02/02/2016 0434   MCHC 33.3 01/28/2017 1648   RDW 17.2 (H) 01/28/2017 1648   LYMPHSABS 0.6 (L) 01/28/2017 1648   MONOABS 0.6 01/28/2017 1648   EOSABS 0.1 01/28/2017 1648   BASOSABS 0.1 01/28/2017 1648    BMET    Component Value Date/Time   NA 136 02/01/2016 0531   K 3.7 02/01/2016 0531   CL 104 02/01/2016 0531   CO2 25 02/01/2016 0531   GLUCOSE 112 (H) 02/01/2016 0531   BUN 10 02/01/2016 0531   CREATININE 0.73 02/01/2016 0531   CALCIUM 8.2 (L) 02/01/2016 0531   GFRNONAA >60 02/01/2016 0531   GFRAA >60 02/01/2016 0531    BNP No results found for: BNP  ProBNP No results found for: PROBNP  Imaging: Ct Chest Wo Contrast  Result Date: 04/22/2018 CLINICAL DATA:  History of lupus with right lower lobe pneumonia. EXAM: CT CHEST WITHOUT CONTRAST  TECHNIQUE: Multidetector CT imaging of the chest was performed following the standard protocol without IV contrast. COMPARISON:  03/17/2018 FINDINGS: Cardiovascular: Heart size upper normal. Trace pericardial effusion is similar. Coronary artery calcification is evident. Atherosclerotic calcification is noted in the wall of the thoracic aorta. Mediastinum/Nodes: Scattered small mediastinal lymph nodes again noted. No evidence for gross hilar lymphadenopathy although assessment is limited by the lack of intravenous contrast on today's study. The esophagus has normal imaging features. There is no axillary lymphadenopathy. Lungs/Pleura: Centrilobular emphysema again noted. Dense right lower lobe airspace consolidation with air bronchograms is not appreciably changed in the interval. The medial component of differential attenuation is similar to prior and has apparent cavitation. 7 mm left lower lobe pulmonary nodule (5:99) is unchanged. Architectural distortion/scarring in the medial left base is stable. 5 mm left lung nodule (5:98) is unchanged. Upper Abdomen: Atherosclerotic calcification noted abdominal aorta. Musculoskeletal: No worrisome lytic or sclerotic osseous abnormality. IMPRESSION: 1. No substantial interval change in the dense right lower lobe consolidative disease. 2. Stable pulmonary nodules left lower lung. 3. Emphysema 4.  Aortic Atherosclerois (ICD10-170.0) Electronically Signed   By: Misty Stanley M.D.   On: 04/22/2018 14:48     Assessment & Plan:   Pleuritic pain - Complains of right side pain with cough or deep breath  - Non cardiac in nature, likely r/t persistent RLL PNA  - Hydromet 27ml qhs prn cough/pain  - Continue tylenol arthritis during the day for pain   LUPUS ERYTHEMATOSUS, DISCOID Methotrexate on hold d/t acute illness  Right lower lobe pneumonia (HCC) - Persistent despite triple abx therapy (cefdinir, azithro, augmentin) - Reviewed current s/s with Dr. Lamonte Sakai, plan  bronch next week   Hemoptysis - Known RLL PNA  - Check CXR and labs today - Plan bronch on 7/11 with Dr. Lamonte Sakai  - Check INR      Martyn Ehrich, NP 04/30/2018

## 2018-04-30 NOTE — Assessment & Plan Note (Signed)
-   Complains of right side pain with cough or deep breath  - Non cardiac in nature, likely r/t persistent RLL PNA  - Hydromet 46ml qhs prn cough/pain  - Continue tylenol arthritis during the day for pain

## 2018-04-30 NOTE — Assessment & Plan Note (Signed)
Methotrexate on hold d/t acute illness

## 2018-04-30 NOTE — Telephone Encounter (Signed)
Spoke with pt. She has been scheduled with Beth today at 11am. Nothing further was needed.

## 2018-04-30 NOTE — Progress Notes (Signed)
@Patient  ID: Casey Wilkinson, female    DOB: [birth year], 77 y.o.   MRN: [id number]  Chief Complaint  Patient presents with  . Acute Visit    Pt states when she went to bed last night, 7/10 she began to have some pain in her right side and had also some increased SOB. Pt also has a cough which she is coughing up bloody mucus. Pt states she has had this pain on her side and has had pna.    Referring provider: Shon Baton, MD  HPI: 77 year old female, former smoker (quit 2002). Hx COPD, HTN, chronic rhinitis, lupus on methotrexate. Patient of Dr. Lamonte Sakai, most recently seen on 03/31/18.   Recent RLL PNA treated with cefdinir, azithro and augment x3 weeks. No improvement in RLL consolidation on most recent CT 04/22/18. Per Dr. Agustina Caroli note, consider bronchoscopy for further eval.   Test: CT chest 04/22/18-  No substantial interval change in the dense right lower lobe consolidative disease. CT chest 03/17/18- Right lower lobe consolidation consistent with pneumonia. A rounded region of lower attenuation containing abnormal air is most consistent with a developing lung abscess.  04/30/2018 Presents today with complaints of severe right side pain last night when lying in bed. Rating pain 10/10, took tylenol arthritis and was able to get some rest. Patient states that she pushed herself yesterday and did more activity than usual. She went shopping for new sheets and had dinner at the [employer]. States that she is more sore today than anything. Continues to have dyspnea on exertion and hemoptysis, coughing up roughly a teaspoon of blood 10 times a day. No fever, shortness of breath at rest or wheezing. Methotrexate is on hold. Continues 2L [place] oxygen.   Allergies  Allergen Reactions  . Prochlorperazine Edisylate Nausea Only and Other (See Comments)    **COMPAZINE**   Stroke-like symptoms  . Sulfamethoxazole Nausea Only and Other (See Comments)    Pt taking Methotrexate, Sulfur drugs could  cause SEVERE fatal reaction.  . Doxycycline Hives    Immunization History  Administered Date(s) Administered  . Influenza Split 07/21/2012, 07/21/2017  . Influenza Whole 07/05/2009, 1Nov 12, 202011, 07/06/2011  . Influenza,inj,Quad PF,6+ Mos 07/13/2013, 07/14/2014, 06/26/2015, 07/16/2016  . Pneumococcal Polysaccharide-23 04/19/2005, 1Nov 12, 202011  . Pneumococcal-Unspecified 07/08/2014    Past Medical History:  Diagnosis Date  . Anemia   . Arthritis    "all over"  . Carotid artery stenosis    right side followed by [facility]- 65%  . Colitis April 2016  . Colon cancer ([place])   . COPD (chronic obstructive pulmonary disease) ([place])   . Discoid lupus erythematosus   . Diverticulitis April 2016   and Colitis  . History of blood transfusion 01/2015, 06/2015   colectomy  . Hypertension   . Psoriatic arthritis ([place])   . Shortness of breath dyspnea    with exertion  . Squamous cell carcinoma, leg     Tobacco History: Social History   Tobacco Use  Smoking Status Former Smoker  . Packs/day: 0.50  . Years: 50.00  . Pack years: 25.00  . Types: Cigarettes  . Last attempt to quit: 12/05/2000  . Years since quitting: 17.4  Smokeless Tobacco Never Used  Tobacco Comment   started smoking in early 46s   Counseling given: Not Answered Comment: started smoking in early 20s   Outpatient Medications Prior to Visit  Medication Sig Dispense Refill  . Acetaminophen (TYLENOL ARTHRITIS PAIN PO) Take by mouth.    Marland Kitchen albuterol (PROAIR  HFA) 108 (90 Base) MCG/ACT inhaler Inhale 2 puffs into the lungs every 4 (four) hours as needed for wheezing or shortness of breath. 3 Inhaler 1  . atorvastatin (LIPITOR) 10 MG tablet Take 1 tablet by mouth every morning.     . Calcium Carbonate-Vitamin D 600-400 MG-UNIT per tablet Take 1 tablet by mouth every morning.     . docusate sodium (COLACE) 100 MG capsule Take 100 mg by mouth at bedtime.     . folic acid (FOLVITE) 1 MG tablet Take 1 mg by mouth every morning.     .  Loratadine 5 MG/5ML SOLN Take 10 mLs by mouth at bedtime.    . Multiple Vitamins-Minerals (ABC PLUS SENIOR) TABS Take 1 tablet by mouth every morning.     Marland Kitchen POLY-IRON 150 150 MG capsule TK 1 C PO QD  5  . polyethylene glycol (MIRALAX / GLYCOLAX) packet Take 17 g by mouth daily as needed for mild constipation.     . Potassium Gluconate 595 MG CAPS Take 1 capsule by mouth at bedtime.     . TRELEGY ELLIPTA 100-62.5-25 MCG/INH AEPB USE 1 INHALATION DAILY 180 each 1  . valsartan-hydrochlorothiazide (DIOVAN-HCT) 160-12.5 MG per tablet Take 1 tablet by mouth every morning.     . vitamin B-12 (CYANOCOBALAMIN) 500 MCG tablet Take 2,500 mcg by mouth every morning.     Marland Kitchen aspirin EC 81 MG tablet Take 81 mg by mouth daily.    . methotrexate (RHEUMATREX) 2.5 MG tablet Take 15 mg by mouth once a week. Wednesdays    . amoxicillin-clavulanate (AUGMENTIN) 875-125 MG tablet Take 1 tablet by mouth 2 (two) times daily. 42 tablet 0   Facility-Administered Medications Prior to Visit  Medication Dose Route Frequency Provider Last Rate Last Dose  . 0.9 %  sodium chloride infusion  500 mL Intravenous Continuous Irene Shipper, MD          Review of Systems  Review of Systems  Constitutional: Negative for fever.  Respiratory: Positive for cough, chest tightness, shortness of breath and wheezing.   Cardiovascular: Negative for chest pain.     Physical Exam  BP (!) 108/58 (BP Location: Left Arm, Cuff Size: Normal)   Pulse 88   Temp 98.3 F (36.8 C) (Oral)   Ht 5\' 5"  (1.651 m)   Wt 164 lb 9.6 oz (74.7 kg)   SpO2 95%   BMI 27.39 kg/m  Physical Exam  Constitutional: She is oriented to person, place, and time. She appears well-developed and well-nourished.  HENT:  Head: Normocephalic and atraumatic.  Right Ear: Tympanic membrane and ear canal normal.  Left Ear: Tympanic membrane and ear canal normal.  Nose: Nose normal.  Mouth/Throat: Uvula is midline and oropharynx is clear and moist.  Eyes:  Conjunctivae and lids are normal.  Neck: Normal range of motion. Neck supple.  Cardiovascular: Normal rate and regular rhythm.  Pulmonary/Chest: Accessory muscle usage present. No tachypnea. No respiratory distress. She has decreased breath sounds. She has no wheezes. She has no rhonchi.  Lymphadenopathy:       Head (right side): No submandibular, no tonsillar, no preauricular and no posterior auricular adenopathy present.       Head (left side): No submandibular, no tonsillar, no preauricular and no posterior auricular adenopathy present.    She has no cervical adenopathy.  Neurological: She is alert and oriented to person, place, and time.  Skin: Skin is warm, dry and intact.  Psychiatric: She has a normal mood  and affect. Her speech is normal and behavior is normal. Judgment and thought content normal. Cognition and memory are normal.     Lab Results:  CBC    Component Value Date/Time   WBC 7.0 01/28/2017 1648   RBC 2.79 (L) 01/28/2017 1648   HGB 8.7 Repeated and verified X2. (L) 01/28/2017 1648   HCT 26.2 Repeated and verified X2. (L) 01/28/2017 1648   PLT 158.0 01/28/2017 1648   MCV 93.9 01/28/2017 1648   MCH 30.0 02/02/2016 0434   MCHC 33.3 01/28/2017 1648   RDW 17.2 (H) 01/28/2017 1648   LYMPHSABS 0.6 (L) 01/28/2017 1648   MONOABS 0.6 01/28/2017 1648   EOSABS 0.1 01/28/2017 1648   BASOSABS 0.1 01/28/2017 1648    BMET    Component Value Date/Time   NA 136 02/01/2016 0531   K 3.7 02/01/2016 0531   CL 104 02/01/2016 0531   CO2 25 02/01/2016 0531   GLUCOSE 112 (H) 02/01/2016 0531   BUN 10 02/01/2016 0531   CREATININE 0.73 02/01/2016 0531   CALCIUM 8.2 (L) 02/01/2016 0531   GFRNONAA >60 02/01/2016 0531   GFRAA >60 02/01/2016 0531    BNP No results found for: BNP  ProBNP No results found for: PROBNP  Imaging: Ct Chest Wo Contrast  Result Date: 04/22/2018 CLINICAL DATA:  History of lupus with right lower lobe pneumonia. EXAM: CT CHEST WITHOUT CONTRAST  TECHNIQUE: Multidetector CT imaging of the chest was performed following the standard protocol without IV contrast. COMPARISON:  03/17/2018 FINDINGS: Cardiovascular: Heart size upper normal. Trace pericardial effusion is similar. Coronary artery calcification is evident. Atherosclerotic calcification is noted in the wall of the thoracic aorta. Mediastinum/Nodes: Scattered small mediastinal lymph nodes again noted. No evidence for gross hilar lymphadenopathy although assessment is limited by the lack of intravenous contrast on today's study. The esophagus has normal imaging features. There is no axillary lymphadenopathy. Lungs/Pleura: Centrilobular emphysema again noted. Dense right lower lobe airspace consolidation with air bronchograms is not appreciably changed in the interval. The medial component of differential attenuation is similar to prior and has apparent cavitation. 7 mm left lower lobe pulmonary nodule (5:99) is unchanged. Architectural distortion/scarring in the medial left base is stable. 5 mm left lung nodule (5:98) is unchanged. Upper Abdomen: Atherosclerotic calcification noted abdominal aorta. Musculoskeletal: No worrisome lytic or sclerotic osseous abnormality. IMPRESSION: 1. No substantial interval change in the dense right lower lobe consolidative disease. 2. Stable pulmonary nodules left lower lung. 3. Emphysema 4.  Aortic Atherosclerois (ICD10-170.0) Electronically Signed   By: Misty Stanley M.D.   On: 04/22/2018 14:48     Assessment & Plan:   Pleuritic pain - Complains of right side pain with cough or deep breath  - Non cardiac in nature, likely r/t persistent RLL PNA  - Hydromet 86ml qhs prn cough/pain  - Continue tylenol arthritis during the day for pain   LUPUS ERYTHEMATOSUS, DISCOID Methotrexate on hold d/t acute illness  Right lower lobe pneumonia (HCC) - Persistent despite triple abx therapy (cefdinir, azithro, augmentin) - Reviewed current s/s with Dr. Lamonte Sakai, plan  bronch next week   Hemoptysis - Known RLL PNA  - Check CXR and labs today - Plan bronch on 7/11 with Dr. Lamonte Sakai  - Check INR      Martyn Ehrich, NP 04/30/2018

## 2018-04-30 NOTE — Assessment & Plan Note (Addendum)
-   Persistent despite triple abx therapy (cefdinir, azithro, augmentin) - Reviewed current s/s with Dr. Lamonte Sakai, plan bronch next week

## 2018-04-30 NOTE — Patient Instructions (Signed)
Plan bronchoscopy next week (looking to do it on Monday)  CXR and labs today  Prescription cough medication at bedtime, follow instructions  As needed tylenol arthritis is ok during the day

## 2018-04-30 NOTE — Assessment & Plan Note (Addendum)
-   Known RLL PNA  - Check CXR and labs today - Plan bronch on 7/11 with Dr. Lamonte Sakai  - Check INR

## 2018-05-06 ENCOUNTER — Inpatient Hospital Stay (HOSPITAL_COMMUNITY): Admission: RE | Admit: 2018-05-06 | Payer: Medicare Other | Source: Ambulatory Visit

## 2018-05-06 ENCOUNTER — Encounter (HOSPITAL_COMMUNITY): Admission: RE | Payer: Self-pay | Source: Ambulatory Visit

## 2018-05-06 ENCOUNTER — Encounter (HOSPITAL_COMMUNITY)
Admission: RE | Disposition: A | Discharge status: Home / Self Care | Payer: Self-pay | Source: Ambulatory Visit | Attending: Emergency Medicine

## 2018-05-06 ENCOUNTER — Inpatient Hospital Stay (HOSPITAL_COMMUNITY)
Admission: EM | Admit: 2018-05-06 | Discharge: 2018-05-09 | DRG: 853 | Disposition: A | Discharge status: Home Health | Payer: Medicare Other | Attending: Family Medicine | Admitting: Family Medicine

## 2018-05-06 ENCOUNTER — Other Ambulatory Visit: Payer: Self-pay

## 2018-05-06 ENCOUNTER — Emergency Department (HOSPITAL_COMMUNITY): Payer: Medicare Other

## 2018-05-06 ENCOUNTER — Ambulatory Visit (HOSPITAL_COMMUNITY)
Admission: RE | Admit: 2018-05-06 | Discharge: 2018-05-06 | Disposition: A | Discharge status: Home / Self Care | Payer: Medicare Other | Source: Ambulatory Visit | Attending: Emergency Medicine | Admitting: Emergency Medicine

## 2018-05-06 ENCOUNTER — Ambulatory Visit (HOSPITAL_BASED_OUTPATIENT_CLINIC_OR_DEPARTMENT_OTHER)
Admission: RE | Admit: 2018-05-06 | Discharge: 2018-05-06 | Disposition: A | Discharge status: Home / Self Care | Payer: Medicare Other | Source: Ambulatory Visit | Attending: Emergency Medicine | Admitting: Emergency Medicine

## 2018-05-06 ENCOUNTER — Ambulatory Visit (HOSPITAL_COMMUNITY): Payer: Medicare Other

## 2018-05-06 ENCOUNTER — Encounter (HOSPITAL_COMMUNITY): Payer: Self-pay | Admitting: Respiratory Therapy

## 2018-05-06 ENCOUNTER — Ambulatory Visit (HOSPITAL_COMMUNITY): Admission: RE | Admit: 2018-05-06 | Payer: Medicare Other | Source: Ambulatory Visit | Admitting: Emergency Medicine

## 2018-05-06 DIAGNOSIS — Z96652 Presence of left artificial knee joint: Secondary | ICD-10-CM | POA: Diagnosis present

## 2018-05-06 DIAGNOSIS — R9389 Abnormal findings on diagnostic imaging of other specified body structures: Secondary | ICD-10-CM | POA: Diagnosis not present

## 2018-05-06 DIAGNOSIS — R509 Fever, unspecified: Secondary | ICD-10-CM | POA: Diagnosis not present

## 2018-05-06 DIAGNOSIS — J440 Chronic obstructive pulmonary disease with acute lower respiratory infection: Secondary | ICD-10-CM | POA: Diagnosis present

## 2018-05-06 DIAGNOSIS — J9610 Chronic respiratory failure, unspecified whether with hypoxia or hypercapnia: Secondary | ICD-10-CM | POA: Diagnosis present

## 2018-05-06 DIAGNOSIS — Z87891 Personal history of nicotine dependence: Secondary | ICD-10-CM

## 2018-05-06 DIAGNOSIS — L4050 Arthropathic psoriasis, unspecified: Secondary | ICD-10-CM | POA: Diagnosis present

## 2018-05-06 DIAGNOSIS — J189 Pneumonia, unspecified organism: Secondary | ICD-10-CM | POA: Diagnosis not present

## 2018-05-06 DIAGNOSIS — J181 Lobar pneumonia, unspecified organism: Secondary | ICD-10-CM | POA: Diagnosis present

## 2018-05-06 DIAGNOSIS — R0602 Shortness of breath: Secondary | ICD-10-CM | POA: Diagnosis not present

## 2018-05-06 DIAGNOSIS — D638 Anemia in other chronic diseases classified elsewhere: Secondary | ICD-10-CM | POA: Diagnosis present

## 2018-05-06 DIAGNOSIS — J441 Chronic obstructive pulmonary disease with (acute) exacerbation: Secondary | ICD-10-CM | POA: Diagnosis present

## 2018-05-06 DIAGNOSIS — Z9049 Acquired absence of other specified parts of digestive tract: Secondary | ICD-10-CM | POA: Diagnosis not present

## 2018-05-06 DIAGNOSIS — Z85038 Personal history of other malignant neoplasm of large intestine: Secondary | ICD-10-CM

## 2018-05-06 DIAGNOSIS — D62 Acute posthemorrhagic anemia: Secondary | ICD-10-CM | POA: Diagnosis present

## 2018-05-06 DIAGNOSIS — L930 Discoid lupus erythematosus: Secondary | ICD-10-CM | POA: Diagnosis present

## 2018-05-06 DIAGNOSIS — C3491 Malignant neoplasm of unspecified part of right bronchus or lung: Secondary | ICD-10-CM | POA: Diagnosis present

## 2018-05-06 DIAGNOSIS — Z8249 Family history of ischemic heart disease and other diseases of the circulatory system: Secondary | ICD-10-CM

## 2018-05-06 DIAGNOSIS — R000 Tachycardia, unspecified: Secondary | ICD-10-CM | POA: Diagnosis not present

## 2018-05-06 DIAGNOSIS — J9611 Chronic respiratory failure with hypoxia: Secondary | ICD-10-CM | POA: Diagnosis not present

## 2018-05-06 DIAGNOSIS — R0689 Other abnormalities of breathing: Secondary | ICD-10-CM | POA: Diagnosis not present

## 2018-05-06 DIAGNOSIS — C3431 Malignant neoplasm of lower lobe, right bronchus or lung: Secondary | ICD-10-CM | POA: Diagnosis present

## 2018-05-06 DIAGNOSIS — J984 Other disorders of lung: Secondary | ICD-10-CM | POA: Diagnosis not present

## 2018-05-06 DIAGNOSIS — Z9981 Dependence on supplemental oxygen: Secondary | ICD-10-CM | POA: Diagnosis not present

## 2018-05-06 DIAGNOSIS — Z9889 Other specified postprocedural states: Secondary | ICD-10-CM

## 2018-05-06 DIAGNOSIS — J9621 Acute and chronic respiratory failure with hypoxia: Secondary | ICD-10-CM | POA: Diagnosis present

## 2018-05-06 DIAGNOSIS — I10 Essential (primary) hypertension: Secondary | ICD-10-CM | POA: Diagnosis present

## 2018-05-06 DIAGNOSIS — E860 Dehydration: Secondary | ICD-10-CM | POA: Diagnosis present

## 2018-05-06 DIAGNOSIS — J449 Chronic obstructive pulmonary disease, unspecified: Secondary | ICD-10-CM | POA: Diagnosis not present

## 2018-05-06 DIAGNOSIS — E785 Hyperlipidemia, unspecified: Secondary | ICD-10-CM | POA: Diagnosis present

## 2018-05-06 DIAGNOSIS — A419 Sepsis, unspecified organism: Secondary | ICD-10-CM | POA: Diagnosis not present

## 2018-05-06 DIAGNOSIS — R042 Hemoptysis: Secondary | ICD-10-CM | POA: Diagnosis present

## 2018-05-06 DIAGNOSIS — Z7982 Long term (current) use of aspirin: Secondary | ICD-10-CM | POA: Diagnosis not present

## 2018-05-06 DIAGNOSIS — R079 Chest pain, unspecified: Secondary | ICD-10-CM | POA: Diagnosis not present

## 2018-05-06 DIAGNOSIS — R0902 Hypoxemia: Secondary | ICD-10-CM | POA: Diagnosis not present

## 2018-05-06 DIAGNOSIS — R846 Abnormal cytological findings in specimens from respiratory organs and thorax: Secondary | ICD-10-CM | POA: Diagnosis not present

## 2018-05-06 DIAGNOSIS — R05 Cough: Secondary | ICD-10-CM | POA: Diagnosis not present

## 2018-05-06 HISTORY — PX: VIDEO BRONCHOSCOPY: SHX5072

## 2018-05-06 LAB — COMPREHENSIVE METABOLIC PANEL
ALBUMIN: 3.1 g/dL — AB (ref 3.5–5.0)
ALK PHOS: 115 U/L (ref 38–126)
ALT: 17 U/L (ref 0–44)
AST: 25 U/L (ref 15–41)
Anion gap: 10 (ref 5–15)
BUN: 24 mg/dL — AB (ref 8–23)
CALCIUM: 9.4 mg/dL (ref 8.9–10.3)
CO2: 24 mmol/L (ref 22–32)
CREATININE: 0.99 mg/dL (ref 0.44–1.00)
Chloride: 102 mmol/L (ref 98–111)
GFR calc non Af Amer: 53 mL/min — ABNORMAL LOW (ref >60)
Glucose, Bld: 146 mg/dL — ABNORMAL HIGH (ref 70–99)
Potassium: 4.1 mmol/L (ref 3.5–5.1)
SODIUM: 136 mmol/L (ref 135–145)
Total Bilirubin: 1.2 mg/dL (ref 0.3–1.2)
Total Protein: 7.9 g/dL (ref 6.5–8.1)

## 2018-05-06 LAB — CBC WITH DIFFERENTIAL/PLATELET
Abs Immature Granulocytes: 0.1 10*3/uL (ref 0.0–0.1)
Basophils Absolute: 0 10*3/uL (ref 0.0–0.1)
Basophils Relative: 0 %
EOS ABS: 0 10*3/uL (ref 0.0–0.7)
Eosinophils Relative: 0 %
HEMATOCRIT: 31.2 % — AB (ref 36.0–46.0)
Hemoglobin: 9.6 g/dL — ABNORMAL LOW (ref 12.0–15.0)
Immature Granulocytes: 0 %
LYMPHS ABS: 0.7 10*3/uL (ref 0.7–4.0)
Lymphocytes Relative: 4 %
MCH: 29.4 pg (ref 26.0–34.0)
MCHC: 30.8 g/dL (ref 30.0–36.0)
MCV: 95.4 fL (ref 78.0–100.0)
MONO ABS: 0.8 10*3/uL (ref 0.1–1.0)
MONOS PCT: 5 %
Neutro Abs: 14.4 10*3/uL — ABNORMAL HIGH (ref 1.7–7.7)
Neutrophils Relative %: 91 %
Platelets: 269 10*3/uL (ref 150–400)
RBC: 3.27 MIL/uL — ABNORMAL LOW (ref 3.87–5.11)
RDW: 14.2 % (ref 11.5–15.5)
WBC: 16 10*3/uL — ABNORMAL HIGH (ref 4.0–10.5)

## 2018-05-06 LAB — I-STAT CG4 LACTIC ACID, ED: Lactic Acid, Venous: 1.41 mmol/L (ref 0.5–1.9)

## 2018-05-06 LAB — PROTIME-INR
INR: 1.11
Prothrombin Time: 14.2 seconds (ref 11.4–15.2)

## 2018-05-06 LAB — I-STAT TROPONIN, ED: TROPONIN I, POC: 0 ng/mL (ref 0.00–0.08)

## 2018-05-06 SURGERY — BRONCHOSCOPY, WITH FLUOROSCOPY
Anesthesia: Moderate Sedation | Laterality: Bilateral

## 2018-05-06 MED ORDER — ACETAMINOPHEN 325 MG PO TABS
650.0000 mg | ORAL_TABLET | Freq: Once | ORAL | Status: AC
  Administered 2018-05-06: 650 mg via ORAL
  Filled 2018-05-06: qty 2

## 2018-05-06 MED ORDER — SODIUM CHLORIDE 0.9 % IV SOLN
Freq: Once | INTRAVENOUS | Status: AC
  Administered 2018-05-06: 13:00:00 via INTRAVENOUS

## 2018-05-06 MED ORDER — IPRATROPIUM-ALBUTEROL 0.5-2.5 (3) MG/3ML IN SOLN: 3.0000 mL | RESPIRATORY_TRACT | Status: DC | PRN

## 2018-05-06 MED ORDER — LIDOCAINE HCL (PF) 1 % IJ SOLN
INTRAMUSCULAR | Status: DC | PRN
  Administered 2018-05-06: 6 mL

## 2018-05-06 MED ORDER — ENOXAPARIN SODIUM 40 MG/0.4ML ~~LOC~~ SOLN: 40.0000 mg | SUBCUTANEOUS | Status: DC

## 2018-05-06 MED ORDER — FENTANYL CITRATE (PF) 100 MCG/2ML IJ SOLN
INTRAMUSCULAR | Status: DC | PRN
  Administered 2018-05-06: 50 ug via INTRAVENOUS

## 2018-05-06 MED ORDER — SODIUM CHLORIDE 0.9 % IV SOLN
2.0000 g | INTRAVENOUS | Status: DC
  Administered 2018-05-07: 2 g via INTRAVENOUS
  Filled 2018-05-06: qty 2

## 2018-05-06 MED ORDER — ACETAMINOPHEN 650 MG RE SUPP: 650.0000 mg | Freq: Four times a day (QID) | RECTAL | Status: DC | PRN

## 2018-05-06 MED ORDER — FOLIC ACID 1 MG PO TABS
1.0000 mg | ORAL_TABLET | Freq: Every morning | ORAL | Status: DC
  Administered 2018-05-07 – 2018-05-09 (×3): 1 mg via ORAL
  Filled 2018-05-06 (×3): qty 1

## 2018-05-06 MED ORDER — CALCIUM CARBONATE-VITAMIN D 500-200 MG-UNIT PO TABS
1.0000 | ORAL_TABLET | Freq: Every morning | ORAL | Status: DC
  Administered 2018-05-07 – 2018-05-09 (×3): 1 via ORAL
  Filled 2018-05-06 (×3): qty 1

## 2018-05-06 MED ORDER — ATORVASTATIN CALCIUM 10 MG PO TABS
10.0000 mg | ORAL_TABLET | Freq: Every morning | ORAL | Status: DC
  Administered 2018-05-07 – 2018-05-09 (×3): 10 mg via ORAL
  Filled 2018-05-06 (×3): qty 1

## 2018-05-06 MED ORDER — VANCOMYCIN HCL 10 G IV SOLR
1500.0000 mg | Freq: Once | INTRAVENOUS | Status: AC
  Administered 2018-05-06: 1500 mg via INTRAVENOUS
  Filled 2018-05-06: qty 1500

## 2018-05-06 MED ORDER — SODIUM CHLORIDE 0.9 % IV BOLUS
1000.0000 mL | Freq: Once | INTRAVENOUS | Status: AC
  Administered 2018-05-06: 1000 mL via INTRAVENOUS

## 2018-05-06 MED ORDER — ACETAMINOPHEN 325 MG PO TABS
650.0000 mg | ORAL_TABLET | Freq: Four times a day (QID) | ORAL | Status: DC | PRN
  Administered 2018-05-08 (×2): 650 mg via ORAL
  Filled 2018-05-06 (×2): qty 2

## 2018-05-06 MED ORDER — LIDOCAINE HCL 2 % EX GEL
1.0000 "application " | Freq: Once | CUTANEOUS | Status: DC
  Filled 2018-05-06: qty 5

## 2018-05-06 MED ORDER — ONDANSETRON HCL 4 MG PO TABS: 4.0000 mg | ORAL_TABLET | Freq: Four times a day (QID) | ORAL | Status: DC | PRN

## 2018-05-06 MED ORDER — SODIUM CHLORIDE 0.9% FLUSH
3.0000 mL | Freq: Two times a day (BID) | INTRAVENOUS | Status: DC
  Administered 2018-05-07 – 2018-05-09 (×5): 3 mL via INTRAVENOUS

## 2018-05-06 MED ORDER — VANCOMYCIN HCL IN DEXTROSE 1-5 GM/200ML-% IV SOLN
1000.0000 mg | Freq: Once | INTRAVENOUS | Status: DC
  Administered 2018-05-06: 1000 mg via INTRAVENOUS
  Filled 2018-05-06: qty 200

## 2018-05-06 MED ORDER — SODIUM CHLORIDE 0.9 % IV SOLN
2.0000 g | Freq: Once | INTRAVENOUS | Status: AC
  Administered 2018-05-06: 2 g via INTRAVENOUS
  Filled 2018-05-06: qty 2

## 2018-05-06 MED ORDER — LIDOCAINE HCL URETHRAL/MUCOSAL 2 % EX GEL
CUTANEOUS | Status: DC | PRN
  Administered 2018-05-06: 1

## 2018-05-06 MED ORDER — MIDAZOLAM HCL 5 MG/ML IJ SOLN
INTRAMUSCULAR | Status: AC
  Filled 2018-05-06: qty 2

## 2018-05-06 MED ORDER — SODIUM CHLORIDE 0.9 % IV SOLN
INTRAVENOUS | Status: DC
  Administered 2018-05-07 (×2): via INTRAVENOUS

## 2018-05-06 MED ORDER — MIDAZOLAM HCL 10 MG/2ML IJ SOLN
INTRAMUSCULAR | Status: DC | PRN
  Administered 2018-05-06: 3 mg via INTRAVENOUS
  Administered 2018-05-06: 1 mg via INTRAVENOUS

## 2018-05-06 MED ORDER — LORATADINE 10 MG PO TABS
10.0000 mg | ORAL_TABLET | Freq: Every day | ORAL | Status: DC
  Administered 2018-05-07 – 2018-05-08 (×3): 10 mg via ORAL
  Filled 2018-05-06 (×3): qty 1

## 2018-05-06 MED ORDER — ONDANSETRON HCL 4 MG/2ML IJ SOLN: 4.0000 mg | Freq: Four times a day (QID) | INTRAMUSCULAR | Status: DC | PRN

## 2018-05-06 MED ORDER — PHENYLEPHRINE HCL 0.25 % NA SOLN: 1.0000 | Freq: Four times a day (QID) | NASAL | Status: DC | PRN

## 2018-05-06 MED ORDER — GUAIFENESIN ER 600 MG PO TB12
600.0000 mg | ORAL_TABLET | Freq: Two times a day (BID) | ORAL | Status: DC
  Administered 2018-05-07 – 2018-05-09 (×6): 600 mg via ORAL
  Filled 2018-05-06 (×6): qty 1

## 2018-05-06 MED ORDER — PHENYLEPHRINE HCL 0.25 % NA SOLN
NASAL | Status: DC | PRN
  Administered 2018-05-06: 2 via NASAL

## 2018-05-06 MED ORDER — FENTANYL CITRATE (PF) 100 MCG/2ML IJ SOLN
INTRAMUSCULAR | Status: AC
  Filled 2018-05-06: qty 4

## 2018-05-06 MED ORDER — HYDROCODONE-ACETAMINOPHEN 5-325 MG PO TABS: 1.0000 | ORAL_TABLET | Freq: Four times a day (QID) | ORAL | 0 refills | Status: DC | PRN

## 2018-05-06 MED ORDER — POLYSACCHARIDE IRON COMPLEX 150 MG PO CAPS
150.0000 mg | ORAL_CAPSULE | Freq: Every day | ORAL | Status: DC
  Administered 2018-05-07 – 2018-05-09 (×3): 150 mg via ORAL
  Filled 2018-05-06 (×3): qty 1

## 2018-05-06 NOTE — Op Note (Signed)
[facility] Cardiopulmonary Patient Name: Casey Wilkinson Pocedure Date: 05/06/2018 MRN: [id number] Attending MD: Collene Gobble , MD Date of Birth: 1941/03/02 CSN: Finalized Age: 77 Admit Type: Outpatient Gender: Female Procedure:            Bronchoscopy Indications:          Hemoptysis with abnormal CXR, Atelectasis of the right                        lower lobe, Persistent atelectasis Providers:            Collene Gobble, MD, Alice "Alex" [place] RRT, RCP, Phillis Knack RRT, RCP Referring MD:          Medicines:            Midazolam 4 mg IV, Fentanyl 50 mcg IV, Lidocaine 1%                        applied to cords 12 mL, Lidocaine 1% applied to the                        tracheobronchial tree 12 mL Complications:        No immediate complications Estimated Blood Loss: Estimated blood loss was minimal. Procedure:            Pre-Anesthesia Assessment:                       - A History and Physical has been performed. Patient                        meds and allergies have been reviewed. The risks and                        benefits of the procedure and the sedation options and                        risks were discussed with the patient. All questions                        were answered and informed consent was obtained.                        Patient identification and proposed procedure were                        verified prior to the procedure by the physician in the                        procedure room. Mental Status Examination: alert and                        oriented. Airway Examination: normal oropharyngeal                        airway. Respiratory Examination: clear to auscultation.                        CV Examination: normal. ASA Grade Assessment:  II - A                        patient with mild systemic disease. After reviewing the                        risks and benefits, the patient was deemed in                        satisfactory  condition to undergo the procedure. The                        anesthesia plan was to use moderate sedation /                        analgesia (conscious sedation). Immediately prior to                        administration of medications, the patient was                        re-assessed for adequacy to receive sedatives. The                        heart rate, respiratory rate, oxygen saturations, blood                        pressure, adequacy of pulmonary ventilation, and                        response to care were monitored throughout the                        procedure. The physical status of the patient was                        re-assessed after the procedure.                       After obtaining informed consent, the bronchoscope was                        passed under direct vision. Throughout the procedure,                        the patient's blood pressure, pulse, and oxygen                        saturations were monitored continuously. the [id number]                        [id number] scope was introduced through the right nostril                        and advanced to the tracheobronchial tree. The                        procedure was accomplished without difficulty. The                        patient tolerated the procedure well. The total  duration of the procedure was 27 minutes. Scope In: 2:16:49 PM Scope Out: 2:38:06 PM Findings:      Normal Nasal mucosa, glottis. The trachea is of normal caliber. The       carina is sharp. The tracheobronchial tree of the left lung was examined       to at least the first subsegmental level. Bronchial mucosa and anatomy       in the left lung are normal; there are no endobronchial lesions. there       was some old blod-tinged mucous suctioned from the lingula, but no clear       endobronchial lesion or any further bleeding seen in this location.      Right Lung Abnormalities: Erythema was found in the right lower  lobe. An       area of inflamed mucosa was found in the right lower lobe. Airways were       slightly narrowed but no endobronchial lesions, secretions or blood       seen. The remainder of the R lung was normal.      Fluoroscopy guided transbronchial brushings of an area of infiltration       were obtained in the right lower lobe with a cytology brush and sent for       routine cytology. Five samples were obtained.      Transbronchial biopsies of an area of infiltration were performed in the       right lower lobe using forceps and sent for histopathology examination.       The procedure was guided by fluoroscopy. Transbronchial biopsy technique       was selected because the sampling site was not visible endoscopically.       Three biopsy passes were performed. Three biopsy samples were obtained.      Bronchoalveolar lavage was performed in the right lower lobe of the lung       and sent for cell count, bacterial culture, viral smears & culture, and       fungal & AFB analysis and cytology. 60 mL of fluid were instilled. 20 mL       were returned. The return was bloody. Impression:           - Hemoptysis with abnormal CXR                       - Atelectasis of the right lower lobe                       - Persistent atelectasis                       - The airway examination of the left lung was normal.                       - Erythema was present in the right lower lobe.                       - Mucosal inflammation was visualized in the right                        lower lobe.                       - Transbronchial brushings were obtained.                       -  Transbronchial lung biopsies were performed.                       - Bronchoalveolar lavage was performed. Moderate Sedation:      Moderate (conscious) sedation was personally administered by the       pulmonologist. The following parameters were monitored: oxygen       saturation, heart rate, blood pressure, respiratory rate,  EKG, adequacy       of pulmonary ventilation, and response to care. Total physician       intraservice time was 27 minutes. Recommendation:       - Await BAL, biopsy and brushing results. Procedure Code(s):    --- Professional ---                       [phone], Bronchoscopy, rigid or flexible, including                        fluoroscopic guidance, when performed; with                        transbronchial lung biopsy(s), single lobe                       [id number], Bronchoscopy, rigid or flexible, including                        fluoroscopic guidance, when performed; with bronchial                        alveolar lavage                       [phone], Bronchoscopy, rigid or flexible, including                        fluoroscopic guidance, when performed; with brushing or                        protected brushings                       [id number], Moderate sedation services provided by the same                        physician or other qualified health care professional                        performing the diagnostic or therapeutic service that                        the sedation supports, requiring the presence of an                        independent trained observer to assist in the                        monitoring of the patient's level of consciousness and                        physiological status; initial 15 minutes of  intraservice time, patient age 17 years or older                       [phone], Moderate sedation services provided by the same                        physician or other qualified health care professional                        performing the diagnostic or therapeutic service that                        the sedation supports, requiring the presence of an                        independent trained observer to assist in the                        monitoring of the patient's level of consciousness and                        physiological status; each  additional 15 minutes                        intraservice time (List separately in addition to code                        for primary service) Diagnosis Code(s):    --- Professional ---                       R04.2, Hemoptysis                       R91.8, Other nonspecific abnormal finding of lung field                       J98.11, Atelectasis                       R09.89, Other specified symptoms and signs involving                        the circulatory and respiratory systems                       J18.9, Pneumonia, unspecified organism CPT copyright 2017 [employer]. All rights reserved. The codes documented in this report are preliminary and upon coder review may  be revised to meet current compliance requirements. Collene Gobble, MD Collene Gobble, MD 05/06/2018 3:07:07 PM Number of Addenda: 0

## 2018-05-06 NOTE — ED Provider Notes (Signed)
[facility] EMERGENCY DEPARTMENT Provider Note   CSN: [id number] Arrival date & time: 05/06/18  2139     History   Chief Complaint Chief Complaint  Patient presents with  . Shortness of Breath    recent biopsy    HPI Casey Wilkinson is a 77 y.o. female.  Patient with oxygen dependent COPD, discoid lupus, recent evaluation and treatment for persistent right lower lobe consolidation, treated with 3 courses of antibiotics --presents with fever to 103 degrees at home with worsening shortness of breath.  Patient had a bronchoscopy with biopsy, bronchial washing and bronchial lavage performed today.  Patient went home and had a cough productive of blood which she was told could happen.  This evening she became febrile more short of breath.  She was told to go to the emergency department if this occurred so she presents for this.  No treatments prior to arrival other than Tylenol.  Patient denies nausea, vomiting, or diarrhea.  She denies dysuria or hematuria.  She denies skin rashes or tick bites.  She has not adjusted her oxygen, is typically on 2 L nasal cannula, transported to 4 L nasal cannula. The onset of this condition was acute. The course is constant. Aggravating factors: none. Alleviating factors: none.       Past Medical History:  Diagnosis Date  . Anemia   . Arthritis    "all over"  . Carotid artery stenosis    right side followed by [facility]- 65%  . Colitis April 2016  . Colon cancer ([place])   . COPD (chronic obstructive pulmonary disease) ([place])   . Discoid lupus erythematosus   . Diverticulitis April 2016   and Colitis  . History of blood transfusion 01/2015, 06/2015   colectomy  . Hypertension   . Psoriatic arthritis ([place])   . Shortness of breath dyspnea    with exertion  . Squamous cell carcinoma, leg     Patient Active Problem List   Diagnosis Date Noted  . S/P bronchoscopy with biopsy   . Pleuritic pain 04/30/2018  . Hemoptysis 04/30/2018  .  Right lower lobe pneumonia ([place]) 03/31/2018  . Chronic rhinitis 11/04/2017  . Pain and swelling of right lower leg 05/05/2017  . Chronic respiratory failure ([place]) 03/12/2017  . Pain of right hand 01/28/2017  . 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 Degenerative arthritis of left knee 01/31/2016  . 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 Nausea with vomiting   . Acute ischemic colitis ([place])   . Rectal bleeding   . Abdominal pain   . Blood in stool   . 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 Colitis 02/06/2015  . Abdominal mass 02/06/2015  . HTN (hypertension) 02/06/2015  . Peripheral vascular disease ([place]) 02/24/2014  . LUPUS ERYTHEMATOSUS, DISCOID 11/21/2009  . Essential hypertension 12/08/2008  . COPD (chronic obstructive pulmonary disease) ([place]) 12/08/2008    Past Surgical History:  Procedure Laterality Date  . APPENDECTOMY  06/2015  . CATARACT EXTRACTION W/ INTRAOCULAR LENS  IMPLANT, BILATERAL Bilateral 2017  . COLONOSCOPY  06/23/15  . COLONOSCOPY W/ POLYPECTOMY    . CYSTOCELE REPAIR  2006  . DILATION AND CURETTAGE OF UTERUS  X 2  . EXCISIONAL HEMORRHOIDECTOMY  2006  . JOINT REPLACEMENT    . LAPAROSCOPIC PARTIAL COLECTOMY N/A 06/23/2015   Procedure: LAPAROSCOPIC ILEOCECETOMY;  Surgeon: Excell Seltzer, MD;  Location: [facility];  Service: General;  Laterality: N/A;  . MOHS SURGERY Left    "basal; LLE"  .  RECTOCELE REPAIR  2006  . SQUAMOUS CELL CARCINOMA EXCISION  "several"   "legs mostly"  . TOTAL KNEE ARTHROPLASTY Left 01/31/2016   Procedure: LEFT TOTAL KNEE ARTHROPLASTY;  Surgeon: Leandrew Koyanagi, MD;  Location: [facility];  Service: Orthopedics;  Laterality: Left;  Marland Kitchen VAGINAL HYSTERECTOMY  1968   "total"  . VIDEO BRONCHOSCOPY Bilateral 05/06/2018   Procedure: VIDEO BRONCHOSCOPY WITH FLUORO;  Surgeon: Collene Gobble, MD;  Location: [facility] ENDOSCOPY;  Service: Cardiopulmonary;  Laterality: Bilateral;     OB History   None      Home Medications    Prior to Admission  medications   Medication Sig Start Date End Date Taking? Authorizing Provider  acetaminophen (TYLENOL) 650 MG CR tablet Take 650 mg by mouth every 8 (eight) hours as needed for pain.    [provider]  albuterol (PROAIR HFA) 108 (90 Base) MCG/ACT inhaler Inhale 2 puffs into the lungs every 4 (four) hours as needed for wheezing or shortness of breath. 03/31/18   Collene Gobble, MD  atorvastatin (LIPITOR) 10 MG tablet Take 10 mg by mouth every morning.  02/14/14   [provider]  Calcium Carbonate-Vitamin D 600-400 MG-UNIT per tablet Take 1 tablet by mouth every morning.     [provider]  docusate sodium (COLACE) 100 MG capsule Take 100 mg by mouth at bedtime.     [provider]  folic acid (FOLVITE) 1 MG tablet Take 1 mg by mouth every morning.     [provider]  HYDROcodone-acetaminophen (NORCO/VICODIN) 5-325 MG tablet Take 1 tablet by mouth every 6 (six) hours as needed for severe pain. 05/06/18   Collene Gobble, MD  HYDROcodone-homatropine [facility]) 5-1.5 MG/5ML syrup Take 5 mLs by mouth at bedtime and may repeat dose one time if needed. 04/30/18   Martyn Ehrich, NP  Loratadine 5 MG/5ML SOLN Take 10 mLs by mouth at bedtime.    [provider]  Multiple Vitamins-Minerals (ABC PLUS SENIOR) TABS Take 1 tablet by mouth every morning.     [provider]  POLY-IRON 150 150 MG capsule Take 150mg  by mouth once daily 05/28/16   [provider]  polyethylene glycol (MIRALAX / GLYCOLAX) packet Take 17 g by mouth daily as needed for mild constipation.     [provider]  Potassium 99 MG TABS Take 99 mg by mouth daily.    [provider]  Donnal Debar 100-62.5-25 MCG/INH AEPB USE 1 INHALATION DAILY 02/26/18   Collene Gobble, MD  valsartan-hydrochlorothiazide (DIOVAN-HCT) 160-12.5 MG per tablet Take 1 tablet by mouth every morning.  02/11/15   [provider]  vitamin B-12 (CYANOCOBALAMIN) 500 MCG  tablet Take 2,500 mcg by mouth every morning.     [provider]    Family History Family History  Problem Relation Age of Onset  . Heart disease Father        Before age 27  . Hyperlipidemia Father   . Hypertension Father   . Pneumonia Mother   . Alzheimer's disease Mother   . Colon cancer Neg Hx     Social History Social History   Tobacco Use  . Smoking status: Former Smoker    Packs/day: 0.50    Years: 50.00    Pack years: 25.00    Types: Cigarettes    Last attempt to quit: 12/05/2000    Years since quitting: 17.4  . Smokeless tobacco: Never Used  . Tobacco comment: started smoking in early  20s  Substance Use Topics  . Alcohol use: No    Alcohol/week: 0.0 oz  . Drug use: No     Allergies   Prochlorperazine edisylate; Sulfamethoxazole; and Doxycycline   Review of Systems Review of Systems  Constitutional: Positive for chills and fever.  HENT: Negative for rhinorrhea and sore throat.   Eyes: Negative for redness.  Respiratory: Positive for cough and shortness of breath. Negative for wheezing.   Cardiovascular: Negative for chest pain.  Gastrointestinal: Negative for abdominal pain, diarrhea, nausea and vomiting.  Genitourinary: Negative for dysuria.  Musculoskeletal: Negative for myalgias.  Skin: Negative for rash.  Neurological: Negative for headaches.     Physical Exam Updated Vital Signs BP (!) 103/39   Pulse (!) 107   Temp (S) (!) 103.3 F (39.6 C) (Rectal)   Resp 19   Ht 5\' 5"  (1.651 m)   Wt 74.4 kg (164 lb)   SpO2 95%   BMI 27.29 kg/m   Physical Exam  Constitutional: She appears well-developed and well-nourished.  Warm to touch  HENT:  Head: Normocephalic and atraumatic.  Mouth/Throat: Oropharynx is clear and moist.  Eyes: Conjunctivae are normal. Right eye exhibits no discharge. Left eye exhibits no discharge.  Neck: Normal range of motion. Neck supple.  Cardiovascular: Normal rate, regular rhythm and normal heart sounds.    Pulmonary/Chest: Effort normal. Tachypnea noted. She has decreased breath sounds in the right middle field and the right lower field. She has rhonchi (Scattered, worse right lower lobe).  Abdominal: Soft. There is no tenderness. There is no rebound and no guarding.  Musculoskeletal:       Right lower leg: She exhibits no tenderness and no edema.       Left lower leg: She exhibits no tenderness and no edema.  Neurological: She is alert.  Skin: Skin is warm and dry.  Psychiatric: She has a normal mood and affect.  Nursing note and vitals reviewed.    ED Treatments / Results  Labs (all labs ordered are listed, but only abnormal results are displayed) Labs Reviewed  COMPREHENSIVE METABOLIC PANEL - Abnormal; Notable for the following components:      Result Value   Glucose, Bld 146 (*)    BUN 24 (*)    Albumin 3.1 (*)    GFR calc non Af Amer 53 (*)    All other components within normal limits  CBC WITH DIFFERENTIAL/PLATELET - Abnormal; Notable for the following components:   WBC 16.0 (*)    RBC 3.27 (*)    Hemoglobin 9.6 (*)    HCT 31.2 (*)    Neutro Abs 14.4 (*)    All other components within normal limits  CULTURE, BLOOD (ROUTINE X 2)  CULTURE, BLOOD (ROUTINE X 2)  URINE CULTURE  PROTIME-INR  URINALYSIS, ROUTINE W REFLEX MICROSCOPIC  I-STAT CG4 LACTIC ACID, ED  I-STAT TROPONIN, ED  I-STAT CG4 LACTIC ACID, ED    EKG None  Radiology Dg Chest Portable 1 View  Result Date: 05/06/2018 CLINICAL DATA:  Chest pain EXAM: PORTABLE CHEST 1 VIEW COMPARISON:  05/06/2018 at 2:42 p.m. FINDINGS: Right lower lobe consolidation, stable. Atherosclerotic calcification of the aortic arch. Heart size within normal limits. Tapering of the peripheral pulmonary vasculature favors emphysema. IMPRESSION: 1. Stable consolidation in the right lower lobe. 2. Aortic Atherosclerosis (ICD10-I70.0) and Emphysema (ICD10-J43.9). Electronically Signed   By: Van Clines M.D.   On: 05/06/2018 22:11    Dg Chest Port 1 View  Result Date: 05/06/2018 CLINICAL DATA:  Followup bronchoscopy and biopsy. EXAM: PORTABLE CHEST 1 VIEW COMPARISON:  04/30/2018 FINDINGS: No pneumothorax or hemothorax. Emphysema and pulmonary scarring. Left lung remains clear. Worsened opacity in the right lower lung which could be due to lavage or hemorrhage. IMPRESSION: Worsened opacity in the right lower lung which could be due to lavage or postprocedure hemorrhage. Background emphysema and scarring. Electronically Signed   By: Nelson Chimes M.D.   On: 05/06/2018 15:17   Dg C-arm Bronchoscopy  Result Date: 05/06/2018 C-ARM BRONCHOSCOPY: Fluoroscopy was utilized by the requesting physician.  No radiographic interpretation.    Procedures Procedures (including critical care time)  Medications Ordered in ED Medications  vancomycin (VANCOCIN) 1,500 mg in sodium chloride 0.9 % 500 mL IVPB (1,500 mg Intravenous New Bag/Given 05/06/18 2335)  sodium chloride 0.9 % bolus 1,000 mL (1,000 mLs Intravenous New Bag/Given 05/06/18 2333)  acetaminophen (TYLENOL) tablet 650 mg (650 mg Oral Given 05/06/18 2201)  ceFEPIme (MAXIPIME) 2 g in sodium chloride 0.9 % 100 mL IVPB (0 g Intravenous Stopped 05/06/18 2248)  sodium chloride 0.9 % bolus 1,000 mL (0 mLs Intravenous Stopped 05/06/18 2323)     Initial Impression / Assessment and Plan / ED Course  I have reviewed the triage vital signs and the nursing notes.  Pertinent labs & imaging results that were available during my care of the patient were reviewed by me and considered in my medical decision making (see chart for details).  Clinical Course as of May 06 2354  Wed May 06, 2018  2150 EKG is sinus tachycardia with a rate of 140 ST depressions inferiorly likely rate related.  Low voltage is unchanged from prior but rate is faster and increased ST depressions.  Compared with prior EKG.   [MB]  6740 77 year old female who has had this ongoing right lower lobe infiltrate that has not  cleared.  She underwent bronchoscopy today with sounds like some brushings and a biopsy and returned home.  Later she was found to be febrile and tachycardic and they brought her to the emergency department for evaluation.  She states she is feeling better now although she still remains tacky.  She is talking in full sentences.  Repeated her x-rays no obvious pneumothorax but does appear to be some worsening of the right lower lobe infiltrate or density.  Will discuss with pulmonary the recommendations but she will likely need to be admitted to the hospital for further management.   [MB]    Clinical Course User Index [MB] Hayden Rasmussen, MD    Patient seen and examined.  Could sepsis work-up initiated.  Antibiotics ordered.  Normal lactic acid.  Initial blood pressure with map greater than 65 however subsequent blood pressures were lower.  2 L of normal saline were ordered.  Patient with improvement in her blood pressure and heart rate with antipyretics and fluids.  Vital signs reviewed and are as follows: BP (!) 103/39   Pulse (!) 107   Temp (S) (!) 103.3 F (39.6 C) (Rectal)   Resp 19   Ht 5\' 5"  (1.651 m)   Wt 74.4 kg (164 lb)   SpO2 95%   BMI 27.29 kg/m   I spoke with pulmonology to make them aware patient.  They request hospitalist admission.  They will consult tomorrow.  Patient appears much more comfortable after initial treatment of fluid bolus.  She is talking complete sentences without any distress.  Heart rate lower but still tachycardic.  I spoke with [facility] hospitalist, Dr. Tamala Julian, who  will see patient.  CRITICAL CARE Performed by: Faustino Congress Total critical care time: 35 minutes Critical care time was exclusive of separately billable procedures and treating other patients. Critical care was necessary to treat or prevent imminent or life-threatening deterioration. Critical care was time spent personally by me on the following activities: development of treatment plan  with patient and/or surrogate as well as nursing, discussions with consultants, evaluation of patient's response to treatment, examination of patient, obtaining history from patient or surrogate, ordering and performing treatments and interventions, ordering and review of laboratory studies, ordering and review of radiographic studies, pulse oximetry and re-evaluation of patient's condition.   Final Clinical Impressions(s) / ED Diagnoses   Final diagnoses:  Sepsis, due to unspecified organism [facility])   Patient with sepsis.  Given procedure performed today, likely pulmonary source, however complete work-up ongoing.  Patient covered with broad-spectrum antibiotics, given a fluid bolus.  Heart rate and blood pressure improved with treatments.  She appears much more comfortable  ED Discharge Orders    None       Carlisle Cater, Hershal Coria 05/07/18 0002    Hayden Rasmussen, MD 05/08/18 [phone]

## 2018-05-06 NOTE — Progress Notes (Signed)
Video bronchoscopy performed.  Intervention bronchial brushing Intervention bronchial biopsy Intervention bronchial washing.  No complications noted.  Will continue to monitor. 

## 2018-05-06 NOTE — Discharge Instructions (Signed)
Flexible Bronchoscopy, Care After These instructions give you information on caring for yourself after your procedure. Your doctor may also give you more specific instructions. Call your doctor if you have any problems or questions after your procedure. Follow these instructions at home:  Do not eat or drink anything for 2 hours after your procedure. If you try to eat or drink before the medicine wears off, food or drink could go into your lungs. You could also burn yourself.  After 2 hours have passed and when you can cough and gag normally, you may eat soft food and drink liquids slowly.  The day after the test, you may eat your normal diet.  You may do your normal activities.  Keep all doctor visits. Get help right away if:  You get more and more short of breath.  You get light-headed.  You feel like you are going to pass out (faint).  You have chest pain.  You have new problems that worry you.  You cough up more than a little blood.  You cough up more blood than before.   Do not eat or drink anything until 4:30 pm on 05/06/2018.   PLEASE CALL OUR OFFICE FOR ANY QUESTIONS OR CONCERNS. [phone].     This information is not intended to replace advice given to you by your health care provider. Make sure you discuss any questions you have with your health care provider. Document Released: 08/04/2009 Document Revised: 03/14/2016 Document Reviewed: 06/11/2013 Elsevier Interactive Patient Education  2017 [employer].

## 2018-05-06 NOTE — Interval H&P Note (Signed)
PCCM Interval Note  Mrs. Casey Wilkinson is a 77 year old former smoker with COPD, hypertension, lupus on methotrexate whom I have followed in the office.  She has a right lower lobe consolidating infiltrate that has not resolved on 2 courses of antibiotics.  Her repeat CT 04/22/2018 was reviewed and confirms that the area still consolidated.  She presents today for further evaluation.  She has continued to have some right-sided chest discomfort especially when she rolls on her right side.  I recommended bronchoscopy to visualize her airways, facilitate biopsy if indicated.  We will have fluoroscopy available.  She understands the procedure, the risks, the benefits.  All questions answered and she agrees to proceed.  Baltazar Apo, MD, PhD 05/06/2018, 2:09 PM [facility] [phone] or if no answer [phone]

## 2018-05-06 NOTE — ED Triage Notes (Signed)
Patient from home after having lung biopsy done for recurrent pneumonia. Hx of COPD.  Temp of 102 for EMS 148/66, HR 110.  Given 1000mg  Tylenol for fever.  Albuterol treatment given in route.

## 2018-05-06 NOTE — H&P (Signed)
History and Physical    Casey Wilkinson FIE:[id number] DOB: 1941-08-17 DOA: 05/06/2018  Referring MD/NP/PA:Josh Sharyn Dross PCP: Shon Baton, MD  Patient coming from: Home via EMS  Chief Complaint: Chills  I have personally briefly reviewed patient's old medical records in [place]   HPI: Casey Wilkinson is a 77 y.o. female with medical history significant of COPD, oxygen dependent on 2 L nasal cannula, psoriatic psoriasis, lupus, and chronic anemia; who presents with complaints of chills after getting home following her bronchoscopy procedure with Dr. Lamonte Sakai today.  Patient has been dealing with a right lower lobe pneumonia for the last 3 months.  Reported continued cough with hemoptysis and shortness of breath and right side pain.  She has been under the care of Dr. Malvin Johns of pulmonology.  Patient had CT scan on 5/28 that showed right lower lobe consolidation concerning for abscess.  At that time patient had been treated with cefdinir plus azithromycin.  Patient was then placed on amoxicillin as symptoms appear to persist and methotrexate was held.  She reports complaining of antibiotics approximately 1 week ago.  After the bronchoscopy procedure today patient had gone home and reported hemoptysis of approximately a teaspoon of blood each time she coughed and she was told to expect.  However, she developed chills and nausea.  Patient was advised to come to the hospital due to symptoms.  ED Course: Patient was found to be febrile up to 103.3 F,  pulse 86-1 39, respirations 16-29, blood pressure 91/62-161/58, O2 saturations 90- 100% on nasal cannula oxygen.  Labs revealed WBC 16, hemoglobin 9.6, BUN 24, creatinine 0.99, lactic acid 1.41.  Chest x-ray showing continued right lower lobe consolidation.  Patient was given  Review of Systems  Constitutional: Positive for chills and fever.  HENT: Negative for ear discharge and hearing loss.   Eyes: Negative for photophobia and pain.    Respiratory: Positive for cough and hemoptysis.   Cardiovascular: Positive for chest pain. Negative for leg swelling.  Gastrointestinal: Positive for nausea. Negative for abdominal pain and vomiting.  Genitourinary: Negative for dysuria and frequency.  Musculoskeletal: Negative for falls.  Neurological: Negative for focal weakness and loss of consciousness.  Endo/Heme/Allergies: Bruises/bleeds easily.  Psychiatric/Behavioral: Negative for substance abuse and suicidal ideas.    Past Medical History:  Diagnosis Date  . Anemia   . Arthritis    "all over"  . Carotid artery stenosis    right side followed by [facility]- 65%  . Colitis April 2016  . Colon cancer ([place])   . COPD (chronic obstructive pulmonary disease) ([place])   . Discoid lupus erythematosus   . Diverticulitis April 2016   and Colitis  . History of blood transfusion 01/2015, 06/2015   colectomy  . Hypertension   . Psoriatic arthritis ([place])   . Shortness of breath dyspnea    with exertion  . Squamous cell carcinoma, leg     Past Surgical History:  Procedure Laterality Date  . APPENDECTOMY  06/2015  . CATARACT EXTRACTION W/ INTRAOCULAR LENS  IMPLANT, BILATERAL Bilateral 2017  . COLONOSCOPY  06/23/15  . COLONOSCOPY W/ POLYPECTOMY    . CYSTOCELE REPAIR  2006  . DILATION AND CURETTAGE OF UTERUS  X 2  . EXCISIONAL HEMORRHOIDECTOMY  2006  . JOINT REPLACEMENT    . LAPAROSCOPIC PARTIAL COLECTOMY N/A 06/23/2015   Procedure: LAPAROSCOPIC ILEOCECETOMY;  Surgeon: Excell Seltzer, MD;  Location: [facility];  Service: General;  Laterality: N/A;  . MOHS SURGERY Left    "  basal; LLE"  . RECTOCELE REPAIR  2006  . SQUAMOUS CELL CARCINOMA EXCISION  "several"   "legs mostly"  . TOTAL KNEE ARTHROPLASTY Left 01/31/2016   Procedure: LEFT TOTAL KNEE ARTHROPLASTY;  Surgeon: Leandrew Koyanagi, MD;  Location: [facility];  Service: Orthopedics;  Laterality: Left;  Marland Kitchen VAGINAL HYSTERECTOMY  1968   "total"  . VIDEO BRONCHOSCOPY Bilateral 05/06/2018   Procedure:  VIDEO BRONCHOSCOPY WITH FLUORO;  Surgeon: Collene Gobble, MD;  Location: [facility];  Service: Cardiopulmonary;  Laterality: Bilateral;     reports that she quit smoking about 17 years ago. Her smoking use included cigarettes. She has a 25.00 pack-year smoking history. She has never used smokeless tobacco. She reports that she does not drink alcohol or use drugs.  Allergies  Allergen Reactions  . Prochlorperazine Edisylate Nausea Only and Other (See Comments)    **COMPAZINE**   Stroke-like symptoms  . Sulfamethoxazole Nausea Only and Other (See Comments)    Pt taking Methotrexate, Sulfur drugs could cause SEVERE fatal reaction.  . Doxycycline Hives    Family History  Problem Relation Age of Onset  . Heart disease Father        Before age 39  . Hyperlipidemia Father   . Hypertension Father   . Pneumonia Mother   . Alzheimer's disease Mother   . Colon cancer Neg Hx     Prior to Admission medications   Medication Sig Start Date End Date Taking? Authorizing Provider  acetaminophen (TYLENOL) 650 MG CR tablet Take 650 mg by mouth every 8 (eight) hours as needed for pain.    [provider]  albuterol (PROAIR HFA) 108 (90 Base) MCG/ACT inhaler Inhale 2 puffs into the lungs every 4 (four) hours as needed for wheezing or shortness of breath. 03/31/18   Collene Gobble, MD  atorvastatin (LIPITOR) 10 MG tablet Take 10 mg by mouth every morning.  02/14/14   [provider]  Calcium Carbonate-Vitamin D 600-400 MG-UNIT per tablet Take 1 tablet by mouth every morning.     [provider]  docusate sodium (COLACE) 100 MG capsule Take 100 mg by mouth at bedtime.     [provider]  folic acid (FOLVITE) 1 MG tablet Take 1 mg by mouth every morning.     [provider]  HYDROcodone-acetaminophen (NORCO/VICODIN) 5-325 MG tablet Take 1 tablet by mouth every 6 (six) hours as needed for severe pain. 05/06/18   Collene Gobble, MD  HYDROcodone-homatropine  [facility]) 5-1.5 MG/5ML syrup Take 5 mLs by mouth at bedtime and may repeat dose one time if needed. 04/30/18   Martyn Ehrich, NP  Loratadine 5 MG/5ML SOLN Take 10 mLs by mouth at bedtime.    [provider]  Multiple Vitamins-Minerals (ABC PLUS SENIOR) TABS Take 1 tablet by mouth every morning.     [provider]  POLY-IRON 150 150 MG capsule Take 151m by mouth once daily 05/28/16   [provider]  polyethylene glycol (MIRALAX / GLYCOLAX) packet Take 17 g by mouth daily as needed for mild constipation.     [provider]  Potassium 99 MG TABS Take 99 mg by mouth daily.    [provider]  TDonnal Debar100-62.5-25 MCG/INH AEPB USE 1 INHALATION DAILY 02/26/18   BCollene Gobble MD  valsartan-hydrochlorothiazide (DIOVAN-HCT) 160-12.5 MG per tablet Take 1 tablet by mouth every morning.  02/11/15   [provider]  vitamin B-12 (CYANOCOBALAMIN) 500 MCG tablet Take 2,500 mcg by  mouth every morning.     [provider]    Physical Exam:  Constitutional: NAD, calm, comfortable Vitals:   05/06/18 2230 05/06/18 2245 05/06/18 2300 05/06/18 2315  BP: 91/60 (!) 93/38 (!) 102/58 (!) 104/38  Pulse: (!) 127 (!) 121 (!) 114 (!) 110  Resp: (!) 22 (!) 25 (!) 22 (!) 21  Temp:      TempSrc:      SpO2: 94% 94% 95% 94%  Weight:      Height:       Eyes: PERRL, lids and conjunctivae normal ENMT: Mucous membranes are moist. Posterior pharynx clear of any exudate or lesions.  Neck: normal, supple, no masses, no thyromegaly Respiratory: Decreased overall aeration with rales noted on the right lower lung fields.  No expiratory wheezes appreciated.  Patient able to talk in complete sentences. Cardiovascular: Regular rate and rhythm, no murmurs / rubs / gallops. No extremity edema. 2+ pedal pulses. No carotid bruits.  Abdomen: no tenderness, no masses palpated. No hepatosplenomegaly. Bowel sounds positive.  Musculoskeletal: no clubbing / cyanosis.  No joint deformity upper and lower extremities. Good ROM, no contractures. Normal muscle tone.  Skin: no rashes, lesions, ulcers. No induration Neurologic: CN 2-12 grossly intact. Sensation intact, DTR normal. Strength 5/5 in all 4.  Psychiatric: Normal judgment and insight. Alert and oriented x 3. Normal mood.     Labs on Admission: I have personally reviewed following labs and imaging studies  CBC: Recent Labs  Lab 04/30/18 1146 05/06/18 2155  WBC 11.6* 16.0*  NEUTROABS 10.0* 14.4*  HGB 9.5* 9.6*  HCT 28.6* 31.2*  MCV 90.9 95.4  PLT 213.0 086   Basic Metabolic Panel: Recent Labs  Lab 04/30/18 1146 05/06/18 2155  NA 136 136  K 4.2 4.1  CL 100 102  CO2 29 24  GLUCOSE 95 146*  BUN 20 24*  CREATININE 0.88 0.99  CALCIUM 9.6 9.4   GFR: Estimated Creatinine Clearance: 47.3 mL/min (by C-G formula based on SCr of 0.99 mg/dL). Liver Function Tests: Recent Labs  Lab 05/06/18 2155  AST 25  ALT 17  ALKPHOS 115  BILITOT 1.2  PROT 7.9  ALBUMIN 3.1*   No results for input(s): LIPASE, AMYLASE in the last 168 hours. No results for input(s): AMMONIA in the last 168 hours. Coagulation Profile: Recent Labs  Lab 04/30/18 1146 05/06/18 2155  INR 1.1* 1.11   Cardiac Enzymes: No results for input(s): CKTOTAL, CKMB, CKMBINDEX, TROPONINI in the last 168 hours. BNP (last 3 results) No results for input(s): PROBNP in the last 8760 hours. HbA1C: No results for input(s): HGBA1C in the last 72 hours. CBG: No results for input(s): GLUCAP in the last 168 hours. Lipid Profile: No results for input(s): CHOL, HDL, LDLCALC, TRIG, CHOLHDL, LDLDIRECT in the last 72 hours. Thyroid Function Tests: No results for input(s): TSH, T4TOTAL, FREET4, T3FREE, THYROIDAB in the last 72 hours. Anemia Panel: No results for input(s): VITAMINB12, FOLATE, FERRITIN, TIBC, IRON, RETICCTPCT in the last 72 hours. Urine analysis:    Component Value Date/Time   COLORURINE YELLOW 01/19/2016 [place] 01/19/2016 1134   LABSPEC 1.021 01/19/2016 1134   PHURINE 6.5 01/19/2016 1134   GLUCOSEU NEGATIVE 01/19/2016 1134   HGBUR NEGATIVE 01/19/2016 1134   BILIRUBINUR NEGATIVE 01/19/2016 1134   KETONESUR NEGATIVE 01/19/2016 1134   PROTEINUR NEGATIVE 01/19/2016 1134   NITRITE NEGATIVE 01/19/2016 1134   LEUKOCYTESUR MODERATE (A) 01/19/2016 1134   Sepsis Labs: Recent Results (from the past 240 hour(s))  Culture, bal-quantitative     Status: None (Preliminary result)   Collection Time: 05/06/18  3:08 PM  Result Value Ref Range Status   Specimen Description BRONCHIAL ALVEOLAR LAVAGE  Final   Special Requests NONE  Final   Gram Stain   Final    ABUNDANT WBC PRESENT,BOTH PMN AND MONONUCLEAR RARE GRAM POSITIVE COCCI Performed at [facility], [address], [place], [place] [address]    Culture PENDING  Incomplete   Report Status PENDING  Incomplete     Radiological Exams on Admission: Dg Chest Portable 1 View  Result Date: 05/06/2018 CLINICAL DATA:  Chest pain EXAM: PORTABLE CHEST 1 VIEW COMPARISON:  05/06/2018 at 2:42 p.m. FINDINGS: Right lower lobe consolidation, stable. Atherosclerotic calcification of the aortic arch. Heart size within normal limits. Tapering of the peripheral pulmonary vasculature favors emphysema. IMPRESSION: 1. Stable consolidation in the right lower lobe. 2. Aortic Atherosclerosis (ICD10-I70.0) and Emphysema (ICD10-J43.9). Electronically Signed   By: Van Clines M.D.   On: 05/06/2018 22:11   Dg Chest Port 1 View  Result Date: 05/06/2018 CLINICAL DATA:  Followup bronchoscopy and biopsy. EXAM: PORTABLE CHEST 1 VIEW COMPARISON:  04/30/2018 FINDINGS: No pneumothorax or hemothorax. Emphysema and pulmonary scarring. Left lung remains clear. Worsened opacity in the right lower lung which could be due to lavage or hemorrhage. IMPRESSION: Worsened opacity in the right lower lung which could be due to lavage or postprocedure hemorrhage. Background  emphysema and scarring. Electronically Signed   By: Nelson Chimes M.D.   On: 05/06/2018 15:17   Dg C-arm Bronchoscopy  Result Date: 05/06/2018 C-ARM BRONCHOSCOPY: Fluoroscopy was utilized by the requesting physician.  No radiographic interpretation.    EKG: Independently reviewed.  Sinus tachycardia 140 bpm  Assessment/Plan Sepsis 2/2 right lower lobe pneumonia: Patient presents febrile, tachycardic and tachypneic after recent bronchoscopy.  WBC elevated up to 16 and chest x-ray showing continued right lower lobe consolidation.  Lactic acid was reassuring at 1.41 and sepsis protocol was initiated with empiric antibiotics of vancomycin and cefepime. - Admit to a telemetry bed - Follow-up blood and sputum cultures - Empiric antibiotics of vancomycin and cefepime - Tylenol  prn fever - Appreciate PCCM consultative services, will follow-up for further recommendation  Acute on chronic respiratory failure with hypoxia, COPD without acute exacerbation: At baseline patient able to maintain O2 saturations on 2 L of nasal cannula oxygen. - Continuous pulse oximetry overnight with nasal oxygen  Dehydration - Gentle IV fluids 75 mL/h overnight  Essential hypertension  - Hold Diovan-hydrochlorothiazide   Anemia of chronic disease: Patient's hemoglobin is 9.6 which appears to be near her baseline. - Continue iron supplementation  History of psoriatic arthritis and discoid lupus: Patient currently not on methotrexate.  Hyperlipidemia - Continue atorvastatin  DVT prophylaxis: lovenox Code Status: Full  Family Communication: Discussed plan of care with the patient family present  Disposition Plan: Likely discharge home in 2 to 3 days once medically stable Consults called: PCCM Admission status: Inpatient  Norval Morton MD [facility] Pager [phone]   If 7PM-7AM, please contact night-coverage [email or web address] Password [facility]  05/06/2018, 11:43 PM

## 2018-05-07 ENCOUNTER — Encounter (HOSPITAL_COMMUNITY): Payer: Self-pay | Admitting: General Practice

## 2018-05-07 DIAGNOSIS — A419 Sepsis, unspecified organism: Principal | ICD-10-CM

## 2018-05-07 DIAGNOSIS — J449 Chronic obstructive pulmonary disease, unspecified: Secondary | ICD-10-CM

## 2018-05-07 DIAGNOSIS — J189 Pneumonia, unspecified organism: Secondary | ICD-10-CM

## 2018-05-07 DIAGNOSIS — R9389 Abnormal findings on diagnostic imaging of other specified body structures: Secondary | ICD-10-CM

## 2018-05-07 LAB — URINALYSIS, ROUTINE W REFLEX MICROSCOPIC
BILIRUBIN URINE: NEGATIVE
Glucose, UA: NEGATIVE mg/dL
Hgb urine dipstick: NEGATIVE
KETONES UR: 5 mg/dL — AB
LEUKOCYTES UA: NEGATIVE
NITRITE: NEGATIVE
PH: 5 (ref 5.0–8.0)
Protein, ur: NEGATIVE mg/dL
Specific Gravity, Urine: 1.01 (ref 1.005–1.030)

## 2018-05-07 LAB — BASIC METABOLIC PANEL
Anion gap: 6 (ref 5–15)
BUN: 19 mg/dL (ref 8–23)
CHLORIDE: 111 mmol/L (ref 98–111)
CO2: 24 mmol/L (ref 22–32)
CREATININE: 0.85 mg/dL (ref 0.44–1.00)
Calcium: 8 mg/dL — ABNORMAL LOW (ref 8.9–10.3)
GFR calc Af Amer: >60 mL/min (ref >60)
GFR calc non Af Amer: >60 mL/min (ref >60)
GLUCOSE: 125 mg/dL — AB (ref 70–99)
Potassium: 3.7 mmol/L (ref 3.5–5.1)
Sodium: 141 mmol/L (ref 135–145)

## 2018-05-07 LAB — TYPE AND SCREEN
ABO/RH(D): O POS
ANTIBODY SCREEN: NEGATIVE

## 2018-05-07 LAB — HEMOGLOBIN AND HEMATOCRIT, BLOOD
HEMATOCRIT: 26.3 % — AB (ref 36.0–46.0)
Hemoglobin: 8.1 g/dL — ABNORMAL LOW (ref 12.0–15.0)

## 2018-05-07 LAB — STREP PNEUMONIAE URINARY ANTIGEN: Strep Pneumo Urinary Antigen: NEGATIVE

## 2018-05-07 LAB — CBC
HEMATOCRIT: 24.7 % — AB (ref 36.0–46.0)
HEMOGLOBIN: 7.7 g/dL — AB (ref 12.0–15.0)
MCH: 29.7 pg (ref 26.0–34.0)
MCHC: 31.2 g/dL (ref 30.0–36.0)
MCV: 95.4 fL (ref 78.0–100.0)
Platelets: 169 10*3/uL (ref 150–400)
RBC: 2.59 MIL/uL — ABNORMAL LOW (ref 3.87–5.11)
RDW: 14.3 % (ref 11.5–15.5)
WBC: 10.6 10*3/uL — ABNORMAL HIGH (ref 4.0–10.5)

## 2018-05-07 LAB — ACID FAST SMEAR (AFB): ACID FAST SMEAR - AFSCU2: NEGATIVE

## 2018-05-07 LAB — ACID FAST SMEAR (AFB, MYCOBACTERIA)

## 2018-05-07 LAB — PROCALCITONIN: Procalcitonin: 0.18 ng/mL

## 2018-05-07 MED ORDER — SODIUM CHLORIDE 0.45 % IV SOLN
INTRAVENOUS | Status: AC
  Administered 2018-05-07: 16:00:00 via INTRAVENOUS

## 2018-05-07 MED ORDER — VANCOMYCIN HCL IN DEXTROSE 1-5 GM/200ML-% IV SOLN
1000.0000 mg | INTRAVENOUS | Status: DC
  Administered 2018-05-07 – 2018-05-08 (×2): 1000 mg via INTRAVENOUS
  Filled 2018-05-07 (×3): qty 200

## 2018-05-07 MED ORDER — SODIUM CHLORIDE 0.9 % IV BOLUS
250.0000 mL | Freq: Once | INTRAVENOUS | Status: AC
  Administered 2018-05-07: 250 mL via INTRAVENOUS

## 2018-05-07 NOTE — Progress Notes (Signed)
Pharmacy Antibiotic Note  Casey Wilkinson is a 77 y.o. female admitted on 05/06/2018 with pneumonia.  Pharmacy has been consulted for vancomycin dosing.  Plan: Vancomycin 1500mg  x1 then 1000mg  IV every 24 hours.  Goal trough 15-20 mcg/mL. (note RN hung and completed 1g dose then 1500mg  dose but had him stop the second bag after ~1/3 of that dose was completed) Will change cefepime to 2g IV every 24 hours for renal function.  Height: 5\' 5"  (165.1 cm) Weight: 164 lb (74.4 kg) IBW/kg (Calculated) : 57  Temp (24hrs), Avg:100.9 F (38.3 C), Min:98.4 F (36.9 C), Max:103.3 F (39.6 C)  Recent Labs  Lab 04/30/18 1146 05/06/18 2155 05/06/18 2204  WBC 11.6* 16.0*  --   CREATININE 0.88 0.99  --   LATICACIDVEN  --   --  1.41    Estimated Creatinine Clearance: 47.3 mL/min (by C-G formula based on SCr of 0.99 mg/dL).    Allergies  Allergen Reactions  . Prochlorperazine Edisylate Nausea Only and Other (See Comments)    **COMPAZINE**   Stroke-like symptoms  . Sulfamethoxazole Nausea Only and Other (See Comments)    Pt taking Methotrexate, Sulfur drugs could cause SEVERE fatal reaction.  . Doxycycline Hives     Thank you for allowing pharmacy to be a part of this patient's care.  Wynona Neat, PharmD, BCPS  05/07/2018 12:08 AM

## 2018-05-07 NOTE — Consult Note (Signed)
Name: Casey Wilkinson MRN: [id number] DOB: 12-Oct-1941    ADMISSION DATE:  05/06/2018 CONSULTATION DATE: 05/07/2018  REFERRING MD : Dr. Bonner Puna, [facility]  Reason for consultation: Non-resolving right lower lobe infiltrate, fever, leukocytosis  SIGNIFICANT EVENTS  Bronchoscopy with right lower lobe brushings, biopsies, BAL on 7/17  STUDIES:  Right lower lobe brushing cytology  7/17 >>  Right lower lobe biopsy pathology 7/17 >>  Right lower lobe BAL cytology 7/17 >>   Cultures: RLL BAL bacterial 7/17 >>  RLL BAL AFB >>  RLL BAL fungal >>    HISTORY OF PRESENT ILLNESS: Casey Wilkinson is a 77 year old pleasant woman known to me from the office in the outpatient setting.  I have followed her for COPD and chronic hypoxemic respiratory failure.  She also has hypertension, discoid lupus and psoriasis which have been treated with methotrexate.  She was seen by Dr. Virgina Jock in early May with dyspnea, chest x-ray showed right lower lobe infiltrate.  She was treated for pneumonia.  She was coughing and having persistent low-grade hemoptysis.  Her aspirin and her methotrexate were both stopped during this illness.  A CT scan of her chest from 5/28 showed right lower lobe consolidation with some areas of air bronchograms.  There was a region of hypoattenuation that was concerning for possible developing lung abscess.  Her antibiotic therapy was extended.  A repeat CT chest on 04/22/2018 showed no significant interval improvement in her dense right lower lobe consolidation.  For this reason she went for bronchoscopy on 05/06/2018.  Transbronchial brushings, biopsies were performed.  She also had a bronchoalveolar lavage.  She tolerated the procedure well, weaned back to her baseline 2 L/min and was able to be discharged home.  Soon after she got home she began to have some generalized weakness and then developed fever.  This prompted her to go to the emergency department where her temperature was documented to be 103.3 F.   Labs showed a leukocytosis.  Her imaging showed the same right lower lobe opacity that we have been following.  She was started on IV antibiotics to cover possible HCAP.  PAST MEDICAL HISTORY :   has a past medical history of Anemia, Arthritis, Carotid artery stenosis, Colitis (April 2016), Colon cancer [facility]), COPD (chronic obstructive pulmonary disease) ([place]), Discoid lupus erythematosus, Diverticulitis (April 2016), History of blood transfusion (01/2015, 06/2015), Hypertension, Psoriatic arthritis ([place]), Shortness of breath dyspnea, and Squamous cell carcinoma, leg.  has a past surgical history that includes Excisional hemorrhoidectomy (2006); Rectocele repair (2006); Vaginal hysterectomy (1968); Colonoscopy (06/23/15); Colonoscopy w/ polypectomy; Laparoscopic partial colectomy (N/A, 06/23/2015); Cataract extraction w/ intraocular lens  implant, bilateral (Bilateral, 2017); Total knee arthroplasty (Left, 01/31/2016); Appendectomy (06/2015); Joint replacement; Dilation and curettage of uterus (X 2); Cystocele repair (2006); Squamous cell carcinoma excision ("several"); Mohs surgery (Left); and Video bronchoscopy (Bilateral, 05/06/2018). Prior to Admission medications   Medication Sig Start Date End Date Taking? Authorizing Provider  acetaminophen (TYLENOL) 650 MG CR tablet Take 650 mg by mouth every 8 (eight) hours as needed for pain.    [provider]  albuterol (PROAIR HFA) 108 (90 Base) MCG/ACT inhaler Inhale 2 puffs into the lungs every 4 (four) hours as needed for wheezing or shortness of breath. 03/31/18   Collene Gobble, MD  atorvastatin (LIPITOR) 10 MG tablet Take 10 mg by mouth every morning.  02/14/14   [provider]  Calcium Carbonate-Vitamin D 600-400 MG-UNIT per tablet Take 1 tablet by mouth every morning.  [provider]  docusate sodium (COLACE) 100 MG capsule Take 100 mg by mouth at bedtime.     [provider]  folic acid (FOLVITE) 1 MG tablet Take 1 mg  by mouth every morning.     [provider]  HYDROcodone-acetaminophen (NORCO/VICODIN) 5-325 MG tablet Take 1 tablet by mouth every 6 (six) hours as needed for severe pain. 05/06/18   Collene Gobble, MD  HYDROcodone-homatropine [facility]) 5-1.5 MG/5ML syrup Take 5 mLs by mouth at bedtime and may repeat dose one time if needed. 04/30/18   Martyn Ehrich, NP  Loratadine 5 MG/5ML SOLN Take 10 mLs by mouth at bedtime.    [provider]  Multiple Vitamins-Minerals (ABC PLUS SENIOR) TABS Take 1 tablet by mouth every morning.     [provider]  POLY-IRON 150 150 MG capsule Take 120m by mouth once daily 05/28/16   [provider]  polyethylene glycol (MIRALAX / GLYCOLAX) packet Take 17 g by mouth daily as needed for mild constipation.     [provider]  Potassium 99 MG TABS Take 99 mg by mouth daily.    [provider]  TDonnal Debar100-62.5-25 MCG/INH AEPB USE 1 INHALATION DAILY 02/26/18   BCollene Gobble MD  valsartan-hydrochlorothiazide (DIOVAN-HCT) 160-12.5 MG per tablet Take 1 tablet by mouth every morning.  02/11/15   [provider]  vitamin B-12 (CYANOCOBALAMIN) 500 MCG tablet Take 2,500 mcg by mouth every morning.     [provider]   Allergies  Allergen Reactions  . Prochlorperazine Edisylate Nausea Only and Other (See Comments)    **COMPAZINE**   Stroke-like symptoms  . Sulfamethoxazole Nausea Only and Other (See Comments)    Pt taking Methotrexate, Sulfur drugs could cause SEVERE fatal reaction.  . Doxycycline Hives    FAMILY HISTORY:  family history includes Alzheimer's disease in her mother; Heart disease in her father; Hyperlipidemia in her father; Hypertension in her father; Pneumonia in her mother. SOCIAL HISTORY:  reports that she quit smoking about 17 years ago. Her smoking use included cigarettes. She has a 25.00 pack-year smoking history. She has never used smokeless tobacco. She reports that she does  not drink alcohol or use drugs.  REVIEW OF SYSTEMS:  Positives are bolded Constitutional: Negative for fever, chills, weight loss, malaise/fatigue and diaphoresis.  HENT: Negative for hearing loss, ear pain, nosebleeds, congestion, sore throat, neck pain, tinnitus and ear discharge.   Eyes: Negative for blurred vision, double vision, photophobia, pain, discharge and redness.  Respiratory: Negative for cough, hemoptysis, sputum production, shortness of breath, wheezing and stridor.   Cardiovascular: Negative for chest pain, palpitations, orthopnea, claudication, leg swelling and PND.  Gastrointestinal: Negative for heartburn, nausea, vomiting, abdominal pain, diarrhea, constipation, blood in stool and melena.  Genitourinary: Negative for dysuria, urgency, frequency, hematuria and flank pain.  Musculoskeletal: Negative for myalgias, back pain, joint pain and falls.  Skin: Negative for itching and rash.  Neurological: Negative for dizziness, tingling, tremors, sensory change, speech change, focal weakness, seizures, loss of consciousness, weakness and headaches.  Endo/Heme/Allergies: Negative for environmental allergies and polydipsia. Does not bruise/bleed easily.  SUBJECTIVE:    VITAL SIGNS: Temp:  [97.5 F (36.4 C)-103.3 F (39.6 C)] 98.3 F (36.8 C) (07/18 1310) Pulse Rate:  [79-139] 87 (07/18 1310) Resp:  [19-29] 20 (07/18 1310) BP: (91-136)/(36-60) 95/59 (07/18 1310) SpO2:  [90 %-98 %] 97 % (07/18 1310) Weight:  [74.4 kg (164 lb)] 74.4 kg (164 lb) (07/17 2145)  PHYSICAL  EXAMINATION: General: Awake, comfortable on nasal cannula O2 Neuro: Awake, appropriate, interacting, answers all questions, moves extremities no focal deficits HEENT: Oropharynx clear, pupils equal Cardiovascular: Regular, no murmur Lungs: Decreased breath sounds on the right, egophony on the right.  The left is clear Abdomen: Soft, nontender, positive bowel sounds Musculoskeletal: No deformities Skin: No  rash  Recent Labs  Lab 05/06/18 2155 05/07/18 0524  NA 136 141  K 4.1 3.7  CL 102 111  CO2 24 24  BUN 24* 19  CREATININE 0.99 0.85  GLUCOSE 146* 125*   Recent Labs  Lab 05/06/18 2155 05/07/18 0524 05/07/18 1516  HGB 9.6* 7.7* 8.1*  HCT 31.2* 24.7* 26.3*  WBC 16.0* 10.6*  --   PLT 269 169  --    Dg Chest Portable 1 View  Result Date: 05/06/2018 CLINICAL DATA:  Chest pain EXAM: PORTABLE CHEST 1 VIEW COMPARISON:  05/06/2018 at 2:42 p.m. FINDINGS: Right lower lobe consolidation, stable. Atherosclerotic calcification of the aortic arch. Heart size within normal limits. Tapering of the peripheral pulmonary vasculature favors emphysema. IMPRESSION: 1. Stable consolidation in the right lower lobe. 2. Aortic Atherosclerosis (ICD10-I70.0) and Emphysema (ICD10-J43.9). Electronically Signed   By: Van Clines M.D.   On: 05/06/2018 22:11   Dg Chest Port 1 View  Result Date: 05/06/2018 CLINICAL DATA:  Followup bronchoscopy and biopsy. EXAM: PORTABLE CHEST 1 VIEW COMPARISON:  04/30/2018 FINDINGS: No pneumothorax or hemothorax. Emphysema and pulmonary scarring. Left lung remains clear. Worsened opacity in the right lower lung which could be due to lavage or hemorrhage. IMPRESSION: Worsened opacity in the right lower lung which could be due to lavage or postprocedure hemorrhage. Background emphysema and scarring. Electronically Signed   By: Nelson Chimes M.D.   On: 05/06/2018 15:17   Dg C-arm Bronchoscopy  Result Date: 05/06/2018 C-ARM BRONCHOSCOPY: Fluoroscopy was utilized by the requesting physician.  No radiographic interpretation.    ASSESSMENT / PLAN:  Persistent right lower lobe opacity.  Etiology unclear.  This has not resolved and the clinical course is inconsistent with a community acquired pneumonia.  I did not see an endobronchial lesion or any significant secretions on her bronchoscopy 7/17.  I remain concerned about a malignancy hiding in the right lower lobe opacity.  Her  biopsies and brushings should help [place] diagnostically.  Suspected bronchitis versus pneumonia due to her bronchoscopy.  FOB and biopsies do put her at risk for developing (question recurrent) pneumonia.  Agree with current IV antibiotics pending culture data from her BAL.  She can likely be transitioned to oral antibiotics if she remains stable, feels up to discharge on 7/19.   Baltazar Apo, MD, PhD 05/07/2018, 4:05 PM [facility] [phone] or if no answer [phone]

## 2018-05-07 NOTE — ED Notes (Signed)
Received call from pharmacist [facility] who advised to stop the Vanc infusion. She advised that the pt was only supposed to get a loading dose of 1500mg  and previous pharmacist did not see that 1250mg  was previously given.

## 2018-05-07 NOTE — Progress Notes (Signed)
PROGRESS NOTE  Casey Wilkinson  TDV:[id number] DOB: Nov 27, 1940 DOA: 05/06/2018 PCP: Shon Baton, MD  Outpatient Specialists: Pulmonology, Lamonte Sakai Brief Narrative: Casey Wilkinson is a 77 y.o. female with a history of tobacco use, 2L-O2-dependent COPD, discoid lupus on MTX, HTN, and chronic anemia who presented to the ED with fever following bronchoscopy. She had 2 courses of antibiotics for RLL infiltrate without resolution confirmed by recent CT with ongoing right chest pain and was evaluated by bronchoscopy 7/17. After coming home after the procedure she had some low volume hemoptysis which is not new, but then complained of generalized weakness and high fever at home for which she presented to the ED. In the ED she was febrile to 103.84F, requiring 4L O2, WBC 16k, and CXR demonstrating stable RLL consolidation. She was put in IV antibiotics and admitted with pulmonology consultation pending.  Assessment & Plan: Principal Problem:   Sepsis due to pneumonia [facility]) Active Problems:   COPD (chronic obstructive pulmonary disease) (HCC)   Discoid lupus erythematosus   HTN (hypertension)   Chronic respiratory failure (HCC)  Sepsis due to RLL pneumonia, persistent RLL infiltrate: Febrile, tachycardic and tachypneic with leukocytosis and RLL consolidation. Onset followed bronchoscopy by only a few hours. PCT only 0.18, leukocytosis improved, suspect significant dehydration based on drop in all cell lines with IVF's.  - Continue broad coverage with vancomycin, check MRSA PCR. Continue cefepime. - Sputum, blood cultures pending - BAL, transbronchial brushings and Bx performed 7/17.  - Pulmonology consulted. To evaluate today.  Acute blood loss anemia on anemia of chronic disease/iron deficiency: With hemoptysis, albeit not large volume currently, and >3L IVF's.  - Recheck H&H to ensure stability and CBC in AM. Transfuse for hgb < 7. - Check anemia panel in AM - Continuing home po iron  Acute on  chronic hypoxic respiratory failure, COPD: No wheezing on exam.  - Supplemental oxygen to maintain SpO2 88-95%, or per pulm.  - Bronchodilators  Dehydration: With ketonuria, significant hemodilution seen since admission.   - Continue maintenance IVF's for 12 hours. No crackles on exam this AM.   Essential hypertension: Currently hypotensive, and has been as outpatient as well.  - Hold BP medications, continue IVF's through today as above.   History of psoriatic arthritis and discoid lupus: Patient currently not on methotrexate.  Hyperlipidemia - Continue atorvastatin  DVT prophylaxis: SCDs Code Status: Full Family Communication: Husband and brother at bedside Disposition Plan: Home when improved  Consultants:   Pulmonology  Procedures:   Bronchoscopy 05/06/2018: Findings: Normal Nasal mucosa, glottis. The trachea is of normal caliber. The carina is sharp. The tracheobronchial tree of the left lung was examined to at least the first subsegmental level. Bronchial mucosa and anatomy in the left lung are normal; there are no endobronchial lesions. there was some old blod-tinged mucous suctioned from the lingula, but no clear endobronchial lesion or any further bleeding seen in this location.  Right Lung Abnormalities: Erythema was found in the right lower lobe. An area of inflamed mucosa was found in the right lower lobe. Airways were slightly narrowed but no endobronchial lesions, secretions or blood seen. The remainder of the R lung was normal.  Fluoroscopy guided transbronchial brushings of an area of infiltration were obtained in the right lower lobe with a cytology brush and sent for routine cytology. Five samples were obtained. Transbronchial biopsies of an area of infiltration were performed in the right lower lobe using forceps and sent for histopathology examination. The procedure was guided  by fluoroscopy. Transbronchial biopsy technique was selected because the  sampling site was not visible endoscopically. Three biopsy passes were performed. Three biopsy samples were obtained. Bronchoalveolar lavage was performed in the right lower lobe of the lung and sent for cell count, bacterial culture, viral smears & culture, and fungal & AFB analysis and cytology. 60 mL of fluid were instilled. 20 mL were returned. The return was bloody.  Impressions: - Hemoptysis with abnormal CXR - Atelectasis of the right lower lobe - Persistent atelectasis - The airway examination of the left lung was normal. - Erythema was present in the right lower lobe. - Mucosal inflammation was visualized in the right lower lobe. - Transbronchial brushings were obtained. - Transbronchial lung biopsies were performed. - Bronchoalveolar lavage was performed.   Antimicrobials: Vancomycin, cefepime 7/17 >>    Subjective: Feels about the same. No fever, but has right lower chest pain worse when laying on that side. Dyspnea is stable from admission, worse than baseline. Feels weak.   Objective: Vitals:   05/07/18 0718 05/07/18 0918 05/07/18 1118 05/07/18 1310  BP: (!) 128/52 (!) 136/57 (!) 95/59 (!) 95/59  Pulse: 81 90 87 87  Resp: _0 Temp: 97.8 F (36.6 C) 98.3 F (36.8 C) 98 F (36.7 C) 98.3 F (36.8 C)  TempSrc: Oral Oral Oral Oral  SpO2: 98% 95% 94% 97%  Weight:      Height:        Intake/Output Summary (Last 24 hours) at 05/07/2018 1410 Last data filed at 05/07/2018 1308 Gross per 24 hour  Intake 801.95 ml  Output 1000 ml  Net -198.05 ml   Filed Weights   05/06/18 2145  Weight: 74.4 kg (164 lb)    Gen: Elderly female in no distress  Pulm: Non-labored tachypnea on 4L O2. Diffusely coarse breath sounds bilaterally without inspiratory crackles, decreased at right base.   CV: Regular rate and rhythm. No murmur, rub, or gallop. No JVD, no pedal edema. GI: Abdomen soft, non-tender, non-distended, with normoactive bowel sounds. No organomegaly or  masses felt. Ext: Warm, no deformities Skin: No rashes, lesions or ulcers Neuro: Alert and oriented. No focal neurological deficits. Psych: Judgement and insight appear normal. Mood & affect appropriate.   Data Reviewed: I have personally reviewed following labs and imaging studies  CBC: Recent Labs  Lab 05/06/18 2155 05/07/18 0524  WBC 16.0* 10.6*  NEUTROABS 14.4*  --   HGB 9.6* 7.7*  HCT 31.2* 24.7*  MCV 95.4 95.4  PLT 269 449   Basic Metabolic Panel: Recent Labs  Lab 05/06/18 2155 05/07/18 0524  NA 136 141  K 4.1 3.7  CL 102 111  CO2 24 24  GLUCOSE 146* 125*  BUN 24* 19  CREATININE 0.99 0.85  CALCIUM 9.4 8.0*   GFR: Estimated Creatinine Clearance: 55.1 mL/min (by C-G formula based on SCr of 0.85 mg/dL). Liver Function Tests: Recent Labs  Lab 05/06/18 2155  AST 25  ALT 17  ALKPHOS 115  BILITOT 1.2  PROT 7.9  ALBUMIN 3.1*   No results for input(s): LIPASE, AMYLASE in the last 168 hours. No results for input(s): AMMONIA in the last 168 hours. Coagulation Profile: Recent Labs  Lab 05/06/18 2155  INR 1.11   Cardiac Enzymes: No results for input(s): CKTOTAL, CKMB, CKMBINDEX, TROPONINI in the last 168 hours. BNP (last 3 results) No results for input(s): PROBNP in the last 8760 hours. HbA1C: No results for input(s): HGBA1C in the last 72 hours. CBG: No  results for input(s): GLUCAP in the last 168 hours. Lipid Profile: No results for input(s): CHOL, HDL, LDLCALC, TRIG, CHOLHDL, LDLDIRECT in the last 72 hours. Thyroid Function Tests: No results for input(s): TSH, T4TOTAL, FREET4, T3FREE, THYROIDAB in the last 72 hours. Anemia Panel: No results for input(s): VITAMINB12, FOLATE, FERRITIN, TIBC, IRON, RETICCTPCT in the last 72 hours. Urine analysis:    Component Value Date/Time   COLORURINE YELLOW 05/07/2018 0521   APPEARANCEUR CLEAR 05/07/2018 0521   LABSPEC 1.010 05/07/2018 0521   PHURINE 5.0 05/07/2018 0521   GLUCOSEU NEGATIVE 05/07/2018 0521    HGBUR NEGATIVE 05/07/2018 0521   BILIRUBINUR NEGATIVE 05/07/2018 0521   KETONESUR 5 (A) 05/07/2018 0521   PROTEINUR NEGATIVE 05/07/2018 0521   NITRITE NEGATIVE 05/07/2018 0521   LEUKOCYTESUR NEGATIVE 05/07/2018 0521   Recent Results (from the past 240 hour(s))  Culture, bal-quantitative     Status: None (Preliminary result)   Collection Time: 05/06/18  3:08 PM  Result Value Ref Range Status   Specimen Description BRONCHIAL ALVEOLAR LAVAGE  Final   Special Requests NONE  Final   Gram Stain   Final    ABUNDANT WBC PRESENT,BOTH PMN AND MONONUCLEAR RARE GRAM POSITIVE COCCI    Culture   Final    TOO YOUNG TO READ Performed at [facility], [place] [address] [place], [place] [address]    Report Status PENDING  Incomplete      Radiology Studies: Dg Chest Portable 1 View  Result Date: 05/06/2018 CLINICAL DATA:  Chest pain EXAM: PORTABLE CHEST 1 VIEW COMPARISON:  05/06/2018 at 2:42 p.m. FINDINGS: Right lower lobe consolidation, stable. Atherosclerotic calcification of the aortic arch. Heart size within normal limits. Tapering of the peripheral pulmonary vasculature favors emphysema. IMPRESSION: 1. Stable consolidation in the right lower lobe. 2. Aortic Atherosclerosis (ICD10-I70.0) and Emphysema (ICD10-J43.9). Electronically Signed   By: Van Clines M.D.   On: 05/06/2018 22:11   Dg Chest Port 1 View  Result Date: 05/06/2018 CLINICAL DATA:  Followup bronchoscopy and biopsy. EXAM: PORTABLE CHEST 1 VIEW COMPARISON:  04/30/2018 FINDINGS: No pneumothorax or hemothorax. Emphysema and pulmonary scarring. Left lung remains clear. Worsened opacity in the right lower lung which could be due to lavage or hemorrhage. IMPRESSION: Worsened opacity in the right lower lung which could be due to lavage or postprocedure hemorrhage. Background emphysema and scarring. Electronically Signed   By: Nelson Chimes M.D.   On: 05/06/2018 15:17   Dg C-arm Bronchoscopy  Result Date: 05/06/2018 C-ARM  BRONCHOSCOPY: Fluoroscopy was utilized by the requesting physician.  No radiographic interpretation.    Scheduled Meds: . atorvastatin  10 mg Oral q morning - 10a  . calcium-vitamin D  1 tablet Oral q morning - 16L  . folic acid  1 mg Oral q morning - 10a  . guaiFENesin  600 mg Oral BID  . iron polysaccharides  150 mg Oral Daily  . loratadine  10 mg Oral QHS  . sodium chloride flush  3 mL Intravenous Q12H   Continuous Infusions: . sodium chloride 75 mL/hr at 05/07/18 1308  . ceFEPime (MAXIPIME) IV    . vancomycin       LOS: 1 day   Time spent: 35 minutes.  Patrecia Pour, MD [facility] [email or web address] Password [email or web address] 05/07/2018, 2:10 PM

## 2018-05-07 NOTE — Progress Notes (Signed)
  Pt admitted to the unit. Pt is stable, alert and oriented per baseline. Oriented to room, staff, and call bell. Educated to call for any assistance. Bed in lowest position, call bell within reach- will continue to monitor. 

## 2018-05-08 DIAGNOSIS — C3491 Malignant neoplasm of unspecified part of right bronchus or lung: Secondary | ICD-10-CM | POA: Diagnosis present

## 2018-05-08 LAB — FERRITIN: Ferritin: 232 ng/mL (ref 11–307)

## 2018-05-08 LAB — CBC
HCT: 24.4 % — ABNORMAL LOW (ref 36.0–46.0)
HEMOGLOBIN: 7.6 g/dL — AB (ref 12.0–15.0)
MCH: 29.5 pg (ref 26.0–34.0)
MCHC: 31.1 g/dL (ref 30.0–36.0)
MCV: 94.6 fL (ref 78.0–100.0)
Platelets: 183 10*3/uL (ref 150–400)
RBC: 2.58 MIL/uL — AB (ref 3.87–5.11)
RDW: 14.1 % (ref 11.5–15.5)
WBC: 7.3 10*3/uL (ref 4.0–10.5)

## 2018-05-08 LAB — VITAMIN B12: VITAMIN B 12: 460 pg/mL (ref 180–914)

## 2018-05-08 LAB — RETICULOCYTES
RBC.: 2.58 MIL/uL — ABNORMAL LOW (ref 3.87–5.11)
Retic Count, Absolute: 33.5 10*3/uL (ref 19.0–186.0)
Retic Ct Pct: 1.3 % (ref 0.4–3.1)

## 2018-05-08 LAB — BASIC METABOLIC PANEL
ANION GAP: 7 (ref 5–15)
BUN: 10 mg/dL (ref 8–23)
CHLORIDE: 108 mmol/L (ref 98–111)
CO2: 24 mmol/L (ref 22–32)
Calcium: 8.4 mg/dL — ABNORMAL LOW (ref 8.9–10.3)
Creatinine, Ser: 0.62 mg/dL (ref 0.44–1.00)
GFR calc Af Amer: >60 mL/min (ref >60)
GFR calc non Af Amer: >60 mL/min (ref >60)
Glucose, Bld: 83 mg/dL (ref 70–99)
POTASSIUM: 3.4 mmol/L — AB (ref 3.5–5.1)
Sodium: 139 mmol/L (ref 135–145)

## 2018-05-08 LAB — URINE CULTURE: CULTURE: NO GROWTH

## 2018-05-08 LAB — LEGIONELLA PNEUMOPHILA SEROGP 1 UR AG: L. PNEUMOPHILA SEROGP 1 UR AG: NEGATIVE

## 2018-05-08 LAB — IRON AND TIBC
IRON: 16 ug/dL — AB (ref 28–170)
SATURATION RATIOS: 9 % — AB (ref 10.4–31.8)
TIBC: 188 ug/dL — ABNORMAL LOW (ref 250–450)
UIBC: 172 ug/dL

## 2018-05-08 LAB — MRSA PCR SCREENING: MRSA by PCR: NEGATIVE

## 2018-05-08 LAB — FOLATE: Folate: 37.5 ng/mL (ref >5.9)

## 2018-05-08 MED ORDER — FLUTICASONE FUROATE-VILANTEROL 100-25 MCG/INH IN AEPB
1.0000 | INHALATION_SPRAY | Freq: Every day | RESPIRATORY_TRACT | Status: DC
  Administered 2018-05-08 – 2018-05-09 (×2): 1 via RESPIRATORY_TRACT
  Filled 2018-05-08 (×2): qty 28

## 2018-05-08 MED ORDER — SODIUM CHLORIDE 0.9 % IV SOLN
1.0000 g | Freq: Two times a day (BID) | INTRAVENOUS | Status: DC
  Administered 2018-05-08 – 2018-05-09 (×3): 1 g via INTRAVENOUS
  Filled 2018-05-08 (×3): qty 1

## 2018-05-08 NOTE — NC FL2 (Signed)
[place] LEVEL OF CARE SCREENING TOOL     IDENTIFICATION  Patient Name: Casey Wilkinson Birthdate: August 22, 1941 Sex: female Admission Date (Current Location): 05/06/2018  [facility] and [place] Number:  Herbalist and Address:  [facility] [place], [place] [address] [place], [place] [address]      Provider Number: [id number]  Attending Physician Name and Address:  Phillips Grout, MD  Relative Name and Phone Number:  Shanon Brow, spouse, [phone]    Current Level of Care: Hospital Recommended Level of Care: [place] Prior Approval Number:    Date Approved/Denied:   PASRR Number: [id number]  Discharge Plan: SNF    Current Diagnoses: Patient Active Problem List   Diagnosis Date Noted  . Adenocarcinoma of right lung ([place])   . Sepsis due to pneumonia ([place]) 05/06/2018  . S/P bronchoscopy with biopsy   . Pleuritic pain 04/30/2018  . Hemoptysis 04/30/2018  . Right lower lobe pneumonia ([place]) 03/31/2018  . Chronic rhinitis 11/04/2017  . Pain and swelling of right lower leg 05/05/2017  . Chronic respiratory failure ([place]) 03/12/2017  . Pain of right hand 01/28/2017  . 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 Degenerative arthritis of left knee 01/31/2016  . 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 Nausea with vomiting   . Acute ischemic colitis ([place])   . Rectal bleeding   . Abdominal pain   . Blood in stool   . 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 Colitis 02/06/2015  . Abdominal mass 02/06/2015  . HTN (hypertension) 02/06/2015  . Peripheral vascular disease ([place]) 02/24/2014  . LUPUS ERYTHEMATOSUS, DISCOID 11/21/2009  . Essential hypertension 12/08/2008  . COPD (chronic obstructive pulmonary disease) ([place]) 12/08/2008    Orientation RESPIRATION BLADDER Height & Weight     Self, Time, Situation, Place  O2(Nasal cannula 4L) Continent, External catheter Weight: 74.4 kg (164 lb) Height:  5\' 5"  (165.1 cm)   BEHAVIORAL SYMPTOMS/MOOD NEUROLOGICAL BOWEL NUTRITION STATUS      Continent Diet(Please see DC Summary)  AMBULATORY STATUS COMMUNICATION OF NEEDS Skin   Limited Assist Verbally Normal                       Personal Care Assistance Level of Assistance  Bathing, Feeding, Dressing Bathing Assistance: Limited assistance Feeding assistance: Independent Dressing Assistance: Limited assistance     Functional Limitations Info  Sight, Hearing, Speech Sight Info: Impaired Hearing Info: Adequate Speech Info: Adequate    SPECIAL CARE FACTORS FREQUENCY  PT (By licensed PT), OT (By licensed OT)     PT Frequency: 5x/week OT Frequency: 3x/week            Contractures      Additional Factors Info  Code Status, Allergies Code Status Info: Full Allergies Info: Prochlorperazine Edisylate, Sulfamethoxazole, Doxycycline           Current Medications (05/08/2018):  This is the current hospital active medication list Current Facility-Administered Medications  Medication Dose Route Frequency Provider Last Rate Last Dose  . acetaminophen (TYLENOL) tablet 650 mg  650 mg Oral Q6H PRN Fuller Plan A, MD   650 mg at 05/08/18 0551   Or  . acetaminophen (TYLENOL) suppository 650 mg  650 mg Rectal Q6H PRN Fuller Plan A, MD      . atorvastatin (LIPITOR) tablet 10 mg  10 mg Oral q morning - 10a Fuller Plan A, MD   10 mg at 05/08/18 0924  . calcium-vitamin D (  OSCAL WITH D) 500-200 MG-UNIT per tablet 1 tablet  1 tablet Oral q morning - 10a Norval Morton, MD   1 tablet at 05/08/18 [id number]  . ceFEPIme (MAXIPIME) 1 g in sodium chloride 0.9 % 100 mL IVPB  1 g Intravenous Q12H Joselyn Glassman A, RPH   Stopped at 05/08/18 6195  . fluticasone furoate-vilanterol (BREO ELLIPTA) 100-25 MCG/INH 1 puff  1 puff Inhalation Daily Derrill Kay A, MD   1 puff at 05/08/18 1106  . folic acid (FOLVITE) tablet 1 mg  1 mg Oral q morning - 10a Fuller Plan A, MD   1 mg at 05/08/18 0924  . guaiFENesin  (MUCINEX) 12 hr tablet 600 mg  600 mg Oral BID Fuller Plan A, MD   600 mg at 05/08/18 0924  . ipratropium-albuterol (DUONEB) 0.5-2.5 (3) MG/3ML nebulizer solution 3 mL  3 mL Nebulization Q4H PRN Smith, Rondell A, MD      . iron polysaccharides (NIFEREX) capsule 150 mg  150 mg Oral Daily Tamala Julian, Rondell A, MD   150 mg at 05/08/18 0924  . loratadine (CLARITIN) tablet 10 mg  10 mg Oral QHS Smith, Rondell A, MD   10 mg at 05/07/18 2012  . ondansetron (ZOFRAN) tablet 4 mg  4 mg Oral Q6H PRN Fuller Plan A, MD       Or  . ondansetron (ZOFRAN) injection 4 mg  4 mg Intravenous Q6H PRN Smith, Rondell A, MD      . sodium chloride flush (NS) 0.9 % injection 3 mL  3 mL Intravenous Q12H Smith, Rondell A, MD   3 mL at 05/08/18 0917  . vancomycin (VANCOCIN) IVPB 1000 mg/200 mL premix  1,000 mg Intravenous Q24H Laren Everts, RPH 200 mL/hr at 05/07/18 2011 1,000 mg at 05/07/18 2011     Discharge Medications: Please see discharge summary for a list of discharge medications.  Relevant Imaging Results:  Relevant Lab Results:   Additional Information SSN: [place], [place]

## 2018-05-08 NOTE — Progress Notes (Signed)
PHARMACY NOTE:  ANTIMICROBIAL RENAL DOSAGE ADJUSTMENT  Current antimicrobial regimen includes a mismatch between antimicrobial dosage and estimated renal function.  As per policy approved by the Pharmacy & Therapeutics and Medical Executive Committees, the antimicrobial dosage will be adjusted accordingly.  Current antimicrobial dosage:  Cefepime 2gm IV q24h  Indication: Pneumonia  Renal Function:  Estimated Creatinine Clearance: 58.6 mL/min (by C-G formula based on SCr of 0.62 mg/dL). []      On intermittent HD, scheduled: []      On CRRT    Antimicrobial dosage has been changed to: Cefepime 1gm IV q12h  Additional comments: Renal function has improved, will increase cefepime to q12h dosing.    Makyra Corprew A. Levada Dy, PharmD, [place] Pager: [phone] Please utilize Amion for appropriate phone number to reach the unit pharmacist ([place])    05/08/2018 8:36 AM

## 2018-05-08 NOTE — Progress Notes (Signed)
Visited with patient via [place].  Patient didn't request consult.  Had a long talk with her medical team about her diagnosis and says she is living her life and trusting God.  She is comfortable with all that is taking place because she  Has had a lot of illnesses.  She is comforted by thinking about her family, the life she has lived and how she would like to die which is well.  Grateful for meeting her and out conversation.      05/08/18 1734  Clinical Encounter Type  Visited With Patient  Visit Type Initial;Spiritual support  Spiritual Encounters  Spiritual Needs Prayer

## 2018-05-08 NOTE — Care Management Important Message (Signed)
Important Message  Patient Details  Name: Casey Wilkinson MRN: [id number] Date of Birth: 09/11/1941   Medicare Important Message Given:  Yes    Orbie Pyo 05/08/2018, 3:45 PM

## 2018-05-08 NOTE — Progress Notes (Signed)
Patient ID: Casey Wilkinson, female   DOB: 03-04-1941, 77 y.o.   MRN: [id number]  PROGRESS NOTE  Casey Wilkinson  WUJ:[id number] DOB: Mar 23, 1941 DOA: 05/06/2018 PCP: Shon Baton, MD  Outpatient Specialists: Pulmonology, Lamonte Sakai Brief Narrative: Casey Wilkinson is a 77 y.o. female with a history of tobacco use, 2L-O2-dependent COPD, discoid lupus on MTX, HTN, and chronic anemia who presented to the ED with fever following bronchoscopy. She had 2 courses of antibiotics for RLL infiltrate without resolution confirmed by recent CT with ongoing right chest pain and was evaluated by bronchoscopy 7/17. After coming home after the procedure she had some low volume hemoptysis which is not new, but then complained of generalized weakness and high fever at home for which she presented to the ED. In the ED she was febrile to 103.39F, requiring 4L O2, WBC 16k, and CXR demonstrating stable RLL consolidation. She was put in IV antibiotics and admitted with pulmonology consultation pending.  Assessment & Plan: Principal Problem:   Sepsis due to pneumonia [facility]) Active Problems:   COPD (chronic obstructive pulmonary disease) (HCC)   Discoid lupus erythematosus   HTN (hypertension)   Chronic respiratory failure (HCC)  Sepsis due to RLL pneumonia, persistent RLL infiltrate: Febrile, tachycardic and tachypneic with leukocytosis and RLL consolidation. Onset followed bronchoscopy by only a few hours. PCT only 0.18, leukocytosis improved, suspect significant dehydration based on drop in all cell lines with IVF's.  - Continue broad coverage with vancomycin, check MRSA PCR. Continue cefepime. - Sputum, blood cultures  negative thus far - BAL, transbronchial brushings and Bx performed 7/17.  Cultures negative thus far - Pulmonology consulted.  Patient clinically seems to be improving  Acute blood loss anemia on anemia of chronic disease/iron deficiency: With hemoptysis, albeit not large volume currently, and >3L IVF's.  -  Recheck H&H to ensure stability and CBC in AM. Transfuse for hgb < 7. - Check anemia panel in AM - Continuing home po iron  Acute on chronic hypoxic respiratory failure, COPD: No wheezing on exam.  - Supplemental oxygen to maintain SpO2 88-95%, or per pulm.  - Bronchodilators  Dehydration: With ketonuria, significant hemodilution seen since admission.   - Continue maintenance IVF's for 12 hours. No crackles on exam this AM.   Essential hypertension: Currently hypotensive, and has been as outpatient as well.  - Hold BP medications, continue IVF's through today as above.   History of psoriatic arthritis and discoid lupus: Patient currently not on methotrexate.  Hyperlipidemia - Continue atorvastatin  DVT prophylaxis: SCDs Code Status: Full Family Communication: Husband and brother at bedside Disposition Plan:   likely rehab once cleared by pulmonology service  Consultants:   Pulmonology  Procedures:   Bronchoscopy 05/06/2018: Findings: Normal Nasal mucosa, glottis. The trachea is of normal caliber. The carina is sharp. The tracheobronchial tree of the left lung was examined to at least the first subsegmental level. Bronchial mucosa and anatomy in the left lung are normal; there are no endobronchial lesions. there was some old blod-tinged mucous suctioned from the lingula, but no clear endobronchial lesion or any further bleeding seen in this location.  Right Lung Abnormalities: Erythema was found in the right lower lobe. An area of inflamed mucosa was found in the right lower lobe. Airways were slightly narrowed but no endobronchial lesions, secretions or blood seen. The remainder of the R lung was normal.  Fluoroscopy guided transbronchial brushings of an area of infiltration were obtained in the right lower lobe with a cytology brush  and sent for routine cytology. Five samples were obtained. Transbronchial biopsies of an area of infiltration were performed in the right  lower lobe using forceps and sent for histopathology examination. The procedure was guided by fluoroscopy. Transbronchial biopsy technique was selected because the sampling site was not visible endoscopically. Three biopsy passes were performed. Three biopsy samples were obtained. Bronchoalveolar lavage was performed in the right lower lobe of the lung and sent for cell count, bacterial culture, viral smears & culture, and fungal & AFB analysis and cytology. 60 mL of fluid were instilled. 20 mL were returned. The return was bloody.  Impressions: - Hemoptysis with abnormal CXR - Atelectasis of the right lower lobe - Persistent atelectasis - The airway examination of the left lung was normal. - Erythema was present in the right lower lobe. - Mucosal inflammation was visualized in the right lower lobe. - Transbronchial brushings were obtained. - Transbronchial lung biopsies were performed. - Bronchoalveolar lavage was performed.   Antimicrobials: Vancomycin, cefepime 7/17 >>    Subjective: Feels a little better but still coughing a lot.  Objective: Vitals:   05/07/18 2051 05/08/18 0038 05/08/18 0431 05/08/18 0938  BP: (!) 151/64 138/62 140/71 (!) 156/64  Pulse: 99 89 94 98  Resp: _0 Temp: 98.6 F (37 C) 98.2 F (36.8 C) 98.3 F (36.8 C) 97.7 F (36.5 C)  TempSrc: Oral Oral Oral Oral  SpO2: 97% 98% 97% 96%  Weight:      Height:        Intake/Output Summary (Last 24 hours) at 05/08/2018 1316 Last data filed at 05/08/2018 0917 Gross per 24 hour  Intake 1303.72 ml  Output 1800 ml  Net -496.28 ml   Filed Weights   05/06/18 2145  Weight: 74.4 kg (164 lb)    Gen: Elderly female in no distress  Pulm: Non-labored tachypnea on 4L O2. Diffusely coarse breath sounds bilaterally without inspiratory crackles, decreased at right base.   CV: Regular rate and rhythm. No murmur, rub, or gallop. No JVD, no pedal edema. GI: Abdomen soft, non-tender, non-distended, with  normoactive bowel sounds. No organomegaly or masses felt. Ext: Warm, no deformities Skin: No rashes, lesions or ulcers Neuro: Alert and oriented. No focal neurological deficits. Psych: Judgement and insight appear normal. Mood & affect appropriate.   Data Reviewed: I have personally reviewed following labs and imaging studies  CBC: Recent Labs  Lab 05/06/18 2155 05/07/18 0524 05/07/18 1516 05/08/18 0433  WBC 16.0* 10.6*  --  7.3  NEUTROABS 14.4*  --   --   --   HGB 9.6* 7.7* 8.1* 7.6*  HCT 31.2* 24.7* 26.3* 24.4*  MCV 95.4 95.4  --  94.6  PLT 269 169  --  009   Basic Metabolic Panel: Recent Labs  Lab 05/06/18 2155 05/07/18 0524 05/08/18 0433  NA 136 141 139  K 4.1 3.7 3.4*  CL 102 111 108  CO2 _1 GLUCOSE 146* 125* 83  BUN 24* 19 10  CREATININE 0.99 0.85 0.62  CALCIUM 9.4 8.0* 8.4*   GFR: Estimated Creatinine Clearance: 58.6 mL/min (by C-G formula based on SCr of 0.62 mg/dL). Liver Function Tests: Recent Labs  Lab 05/06/18 2155  AST 25  ALT 17  ALKPHOS 115  BILITOT 1.2  PROT 7.9  ALBUMIN 3.1*   No results for input(s): LIPASE, AMYLASE in the last 168 hours. No results for input(s): AMMONIA in the last 168 hours. Coagulation Profile: Recent Labs  Lab 05/06/18  2155  INR 1.11   Cardiac Enzymes: No results for input(s): CKTOTAL, CKMB, CKMBINDEX, TROPONINI in the last 168 hours. BNP (last 3 results) No results for input(s): PROBNP in the last 8760 hours. HbA1C: No results for input(s): HGBA1C in the last 72 hours. CBG: No results for input(s): GLUCAP in the last 168 hours. Lipid Profile: No results for input(s): CHOL, HDL, LDLCALC, TRIG, CHOLHDL, LDLDIRECT in the last 72 hours. Thyroid Function Tests: No results for input(s): TSH, T4TOTAL, FREET4, T3FREE, THYROIDAB in the last 72 hours. Anemia Panel: Recent Labs    05/08/18 0433  VITAMINB12 460  FOLATE 37.5  FERRITIN 232  TIBC 188*  IRON 16*  RETICCTPCT 1.3   Urine analysis:      Component Value Date/Time   COLORURINE YELLOW 05/07/2018 0521   APPEARANCEUR CLEAR 05/07/2018 0521   LABSPEC 1.010 05/07/2018 0521   PHURINE 5.0 05/07/2018 0521   GLUCOSEU NEGATIVE 05/07/2018 0521   HGBUR NEGATIVE 05/07/2018 0521   BILIRUBINUR NEGATIVE 05/07/2018 0521   KETONESUR 5 (A) 05/07/2018 0521   PROTEINUR NEGATIVE 05/07/2018 0521   NITRITE NEGATIVE 05/07/2018 0521   LEUKOCYTESUR NEGATIVE 05/07/2018 0521   Recent Results (from the past 240 hour(s))  Culture, bal-quantitative     Status: Abnormal (Preliminary result)   Collection Time: 05/06/18  3:08 PM  Result Value Ref Range Status   Specimen Description BRONCHIAL ALVEOLAR LAVAGE  Final   Special Requests NONE  Final   Gram Stain   Final    ABUNDANT WBC PRESENT,BOTH PMN AND MONONUCLEAR RARE GRAM POSITIVE COCCI    Culture (A)  Final    >=100,000 COLONIES/mL Consistent with normal respiratory flora. Performed at [facility], [place] [address] [place], [place] [address]    Report Status PENDING  Incomplete  Acid Fast Smear (AFB)     Status: None   Collection Time: 05/06/18  3:08 PM  Result Value Ref Range Status   AFB Specimen Processing Concentration  Final   Acid Fast Smear Negative  Final    Comment: (NOTE) Performed At: [facility] [place], [place] [id number] Rush Farmer MD VX:[id number]    Source (AFB) BRONCHIAL ALVEOLAR LAVAGE  Final    Comment: RLL  Culture, blood (Routine x 2)     Status: None (Preliminary result)   Collection Time: 05/06/18  9:57 PM  Result Value Ref Range Status   Specimen Description BLOOD RIGHT ANTECUBITAL  Final   Special Requests   Final    BOTTLES DRAWN AEROBIC AND ANAEROBIC Blood Culture results may not be optimal due to an inadequate volume of blood received in culture bottles   Culture   Final    NO GROWTH 2 DAYS Performed at [facility], [address] [place], [place] [address]    Report Status PENDING  Incomplete  Culture, blood  (Routine x 2)     Status: None (Preliminary result)   Collection Time: 05/06/18 10:05 PM  Result Value Ref Range Status   Specimen Description BLOOD RIGHT WRIST  Final   Special Requests   Final    BOTTLES DRAWN AEROBIC AND ANAEROBIC Blood Culture adequate volume   Culture   Final    NO GROWTH 2 DAYS Performed at [facility], [place] [address], [place], [place] [address]    Report Status PENDING  Incomplete  Urine Culture     Status: None   Collection Time: 05/07/18  7:02 AM  Result Value Ref Range Status  Specimen Description URINE, RANDOM  Final   Special Requests NONE  Final   Culture   Final    NO GROWTH Performed at [facility], [place] [address], [place], [place] [address]    Report Status 05/08/2018 FINAL  Final  MRSA PCR Screening     Status: None   Collection Time: 05/08/18  7:38 AM  Result Value Ref Range Status   MRSA by PCR NEGATIVE NEGATIVE Final    Comment:        The GeneXpert MRSA Assay (FDA approved for NASAL specimens only), is one component of a comprehensive MRSA colonization surveillance program. It is not intended to diagnose MRSA infection nor to guide or monitor treatment for MRSA infections. Performed at [facility], [place] [address], [place], [place] [address]       Radiology Studies: Dg Chest Portable 1 View  Result Date: 05/06/2018 CLINICAL DATA:  Chest pain EXAM: PORTABLE CHEST 1 VIEW COMPARISON:  05/06/2018 at 2:42 p.m. FINDINGS: Right lower lobe consolidation, stable. Atherosclerotic calcification of the aortic arch. Heart size within normal limits. Tapering of the peripheral pulmonary vasculature favors emphysema. IMPRESSION: 1. Stable consolidation in the right lower lobe. 2. Aortic Atherosclerosis (ICD10-I70.0) and Emphysema (ICD10-J43.9). Electronically Signed   By: Van Clines M.D.   On: 05/06/2018 22:11   Dg Chest Port 1 View  Result Date: 05/06/2018 CLINICAL DATA:  Followup bronchoscopy and biopsy. EXAM: PORTABLE  CHEST 1 VIEW COMPARISON:  04/30/2018 FINDINGS: No pneumothorax or hemothorax. Emphysema and pulmonary scarring. Left lung remains clear. Worsened opacity in the right lower lung which could be due to lavage or hemorrhage. IMPRESSION: Worsened opacity in the right lower lung which could be due to lavage or postprocedure hemorrhage. Background emphysema and scarring. Electronically Signed   By: Nelson Chimes M.D.   On: 05/06/2018 15:17   Dg C-arm Bronchoscopy  Result Date: 05/06/2018 C-ARM BRONCHOSCOPY: Fluoroscopy was utilized by the requesting physician.  No radiographic interpretation.    Scheduled Meds: . atorvastatin  10 mg Oral q morning - 10a  . calcium-vitamin D  1 tablet Oral q morning - 10a  . fluticasone furoate-vilanterol  1 puff Inhalation Daily  . folic acid  1 mg Oral q morning - 10a  . guaiFENesin  600 mg Oral BID  . iron polysaccharides  150 mg Oral Daily  . loratadine  10 mg Oral QHS  . sodium chloride flush  3 mL Intravenous Q12H   Continuous Infusions: . ceFEPime (MAXIPIME) IV 1 g (05/08/18 0923)  . vancomycin 1,000 mg (05/07/18 2011)     LOS: 2 days   Time spent: 35 minutes.  Casey Grout, MD [facility] [email or web address] Password [email or web address] 05/08/2018, 1:16 PM

## 2018-05-08 NOTE — Evaluation (Signed)
Physical Therapy Evaluation Patient Details Name: Casey Wilkinson MRN: [id number] DOB: [birth year] Today's Date: 05/08/2018   History of Present Illness  Pt is a 77 y/o female admitted following fever and hemoptysis. Pt with recent bronchoscopy on 7/17. Imaging revealed stable consolidation of R lower lobe. 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   Clinical Impression  Pt admitted secondary to problem above with deficits below. Pt presenting with poor activity tolerance this session and only able to perform bed to chair transfer with min to min guard A. Pt with increased SOB and sats ranged from 88%-92% on 3L of oxygen throughout mobility. Given pt's limited mobility tolerance, and inability to perform mobility/self care activities, feel pt would benefit from short term SNF prior to d/c. Pt may be able to progress to home, if breathing and activity tolerance continues to improve. Will continue to follow acutely to maximize functional mobility independence and safety.     Follow Up Recommendations SNF;Supervision for mobility/OOB    Equipment Recommendations  Other (comment)(rollator )    Recommendations for Other Services OT consult     Precautions / Restrictions Precautions Precautions: Fall Restrictions Weight Bearing Restrictions: No      Mobility  Bed Mobility Overal bed mobility: Needs Assistance Bed Mobility: Supine to Sit     Supine to sit: Min assist     General bed mobility comments: Min A for trunk elevation. Increased time required. Oxygen sats decreasing to 88% on 3L and pt with increased SOB. REquired extended seated rest and cues for pursed lip breathing for sats to come back up to 92% on 3L.   Transfers Overall transfer level: Needs assistance Equipment used: None Transfers: Sit to/from [facility] Sit to Stand: Min assist Stand pivot transfers: Min guard       General transfer comment: Min A for lift assist and steadying.  Min guard for steadying assist throughout transfer to chair. Pt with increased SOB, therefore further mobility deferred. Oxygen sats from 90-92% on 3L throughout transfer.   Ambulation/Gait             General Gait Details: Deferred   Stairs            Wheelchair Mobility    Modified Rankin (Stroke Patients Only)       Balance Overall balance assessment: Needs assistance Sitting-balance support: No upper extremity supported;Feet supported Sitting balance-Leahy Scale: Fair     Standing balance support: No upper extremity supported;During functional activity Standing balance-Leahy Scale: Fair                               Pertinent Vitals/Pain Pain Assessment: No/denies pain    Home Living Family/patient expects to be discharged to:: Private residence Living Arrangements: Spouse/significant other Available Help at Discharge: Family;Available 24 hours/day Type of Home: House Home Access: Stairs to enter Entrance Stairs-Rails: Right Entrance Stairs-Number of Steps: 2 Home Layout: One level Home Equipment: Tub bench;Walker - 2 wheels;Cane - single point;Other (comment);Bedside commode(oxygen )      Prior Function Level of Independence: Independent               Hand Dominance   Dominant Hand: Right    Extremity/Trunk Assessment   Upper Extremity Assessment Upper Extremity Assessment: Defer to OT evaluation    Lower Extremity Assessment Lower Extremity Assessment: Generalized weakness    Cervical / Trunk Assessment Cervical / Trunk Assessment: Kyphotic  Communication   Communication: No difficulties  Cognition Arousal/Alertness: Awake/alert Behavior During Therapy: WFL for tasks assessed/performed Overall Cognitive Status: Within Functional Limits for tasks assessed                                        General Comments General comments (skin integrity, edema, etc.): Pt's husband and brother present during  session     Exercises     Assessment/Plan    PT Assessment Patient needs continued PT services  PT Problem List Decreased strength;Decreased activity tolerance;Decreased balance;Decreased mobility;Decreased knowledge of use of DME;Cardiopulmonary status limiting activity       PT Treatment Interventions DME instruction;Gait training;Functional mobility training;Stair training;Therapeutic activities;Therapeutic exercise;Balance training;Patient/family education    PT Goals (Current goals can be found in the Care Plan section)  Acute Rehab PT Goals Patient Stated Goal: "to be able to breathe better"  PT Goal Formulation: With patient Time For Goal Achievement: 05/22/18 Potential to Achieve Goals: Fair    Frequency Min 3X/week   Barriers to discharge        Co-evaluation               AM-PAC PT "6 Clicks" Daily Activity  Outcome Measure Difficulty turning over in bed (including adjusting bedclothes, sheets and blankets)?: A Lot Difficulty moving from lying on back to sitting on the side of the bed? : Unable Difficulty sitting down on and standing up from a chair with arms (e.g., wheelchair, bedside commode, etc,.)?: Unable Help needed moving to and from a bed to chair (including a wheelchair)?: A Little Help needed walking in hospital room?: A Little Help needed climbing 3-5 steps with a railing? : A Lot 6 Click Score: 12    End of Session Equipment Utilized During Treatment: Gait belt;Oxygen Activity Tolerance: Patient limited by fatigue;Treatment limited secondary to medical complications (Comment)(SOB ) Patient left: in chair;with call bell/phone within reach;with family/visitor present Nurse Communication: Mobility status;Other (comment)(pt asking about inhaler ) PT Visit Diagnosis: Other abnormalities of gait and mobility (R26.89);Difficulty in walking, not elsewhere classified (R26.2);Unsteadiness on feet (R26.81)    Time: 7017-7939 PT Time Calculation (min)  (ACUTE ONLY): 24 min   Charges:   PT Evaluation $PT Eval Moderate Complexity: 1 Mod PT Treatments $Therapeutic Activity: 8-22 mins   PT G Codes:        Leighton Ruff, PT, DPT  Acute Rehabilitation Services  Pager: [phone]   Rudean Hitt 05/08/2018, 1:05 PM

## 2018-05-08 NOTE — Progress Notes (Signed)
Name: Casey Wilkinson MRN: [id number] DOB: 1941-08-02    ADMISSION DATE:  05/06/2018 CONSULTATION DATE: 05/07/2018  REFERRING MD : Dr. Bonner Puna, [facility]  Reason for consultation: Non-resolving right lower lobe infiltrate, fever, leukocytosis  SIGNIFICANT EVENTS  Bronchoscopy with right lower lobe brushings, biopsies, BAL on 7/17  STUDIES:  Right lower lobe brushing cytology  7/17 >>  Right lower lobe biopsy pathology 7/17 >>  Right lower lobe BAL cytology 7/17 >>   Cultures: RLL BAL bacterial 7/17 >>  RLL BAL AFB >>  RLL BAL fungal >>    HISTORY OF PRESENT ILLNESS: Casey Wilkinson is a 77 year old pleasant woman known to me from the office in the outpatient setting.  I have followed her for COPD and chronic hypoxemic respiratory failure.  She also has hypertension, discoid lupus and psoriasis which have been treated with methotrexate.  She was seen by Dr. Virgina Jock in early May with dyspnea, chest x-ray showed right lower lobe infiltrate.  She was treated for pneumonia.  She was coughing and having persistent low-grade hemoptysis.  Her aspirin and her methotrexate were both stopped during this illness.  A CT scan of her chest from 5/28 showed right lower lobe consolidation with some areas of air bronchograms.  There was a region of hypoattenuation that was concerning for possible developing lung abscess.  Her antibiotic therapy was extended.  A repeat CT chest on 04/22/2018 showed no significant interval improvement in her dense right lower lobe consolidation.  For this reason she went for bronchoscopy on 05/06/2018.  Transbronchial brushings, biopsies were performed.  She also had a bronchoalveolar lavage.  She tolerated the procedure well, weaned back to her baseline 2 L/min and was able to be discharged home.  Soon after she got home she began to have some generalized weakness and then developed fever.  This prompted her to go to the emergency department where her temperature was documented to be 103.3 F.   Labs showed a leukocytosis.  Her imaging showed the same right lower lobe opacity that we have been following.  She was started on IV antibiotics to cover possible HCAP.  SUBJECTIVE:  Sitting up at bedside, feels some chest tightness.  She did not get her Trelegy overnight, has been ordered for today Unfortunately her transbronchial biopsies show adenocarcinoma, consistent with non-small cell lung cancer.  VITAL SIGNS: Temp:  [97.7 F (36.5 C)-99.1 F (37.3 C)] 97.7 F (36.5 C) (07/19 0938) Pulse Rate:  [85-99] 98 (07/19 0938) Resp:  [19-20] 19 (07/19 0938) BP: (137-156)/(62-82) 156/64 (07/19 0938) SpO2:  [96 %-98 %] 96 % (07/19 0938)  PHYSICAL EXAMINATION: General: Awake, comfortable on nasal cannula O2 Neuro: Awake, appropriate, interacting, answers all questions, moves extremities no focal deficits HEENT: Oropharynx clear, pupils equal Cardiovascular: Regular, no murmur Lungs: Decreased breath sounds on the right, egophony on the right.  The left is clear Abdomen: Soft, nontender, positive bowel sounds Musculoskeletal: No deformities Skin: No rash  Recent Labs  Lab 05/06/18 2155 05/07/18 0524 05/08/18 0433  NA 136 141 139  K 4.1 3.7 3.4*  CL 102 111 108  CO2 _0 BUN 24* 19 10  CREATININE 0.99 0.85 0.62  GLUCOSE 146* 125* 83   Recent Labs  Lab 05/06/18 2155 05/07/18 0524 05/07/18 1516 05/08/18 0433  HGB 9.6* 7.7* 8.1* 7.6*  HCT 31.2* 24.7* 26.3* 24.4*  WBC 16.0* 10.6*  --  7.3  PLT 269 169  --  183   Dg Chest Portable 1 View  Result Date: 05/06/2018 CLINICAL DATA:  Chest pain EXAM: PORTABLE CHEST 1 VIEW COMPARISON:  05/06/2018 at 2:42 p.m. FINDINGS: Right lower lobe consolidation, stable. Atherosclerotic calcification of the aortic arch. Heart size within normal limits. Tapering of the peripheral pulmonary vasculature favors emphysema. IMPRESSION: 1. Stable consolidation in the right lower lobe. 2. Aortic Atherosclerosis (ICD10-I70.0) and Emphysema  (ICD10-J43.9). Electronically Signed   By: Van Clines M.D.   On: 05/06/2018 22:11   Dg Chest Port 1 View  Result Date: 05/06/2018 CLINICAL DATA:  Followup bronchoscopy and biopsy. EXAM: PORTABLE CHEST 1 VIEW COMPARISON:  04/30/2018 FINDINGS: No pneumothorax or hemothorax. Emphysema and pulmonary scarring. Left lung remains clear. Worsened opacity in the right lower lung which could be due to lavage or hemorrhage. IMPRESSION: Worsened opacity in the right lower lung which could be due to lavage or postprocedure hemorrhage. Background emphysema and scarring. Electronically Signed   By: Nelson Chimes M.D.   On: 05/06/2018 15:17   Dg C-arm Bronchoscopy  Result Date: 05/06/2018 C-ARM BRONCHOSCOPY: Fluoroscopy was utilized by the requesting physician.  No radiographic interpretation.    ASSESSMENT / PLAN:  Right lower lobe adenocarcinoma.  Presumed that this is primary lung cancer (has a history of colon).   I reviewed the pathology results with her.  Talked about the diagnosis, the prognosis, the possible modalities for therapy.  We will set her up with oncology as an outpatient.  Suspect that she will require chemotherapy given the extensive right lower lobe involvement.  She will need PET scanning, MRI brain as an outpatient.  Molecular studies can be sent and hopefully she may be a candidate for targeted therapy.   Suspected bronchitis versus pneumonia due to her bronchoscopy.   Burtis Junes changing her over to oral antibiotics in preparation for discharge home.  Complete a 7-day course.  COPD Trelegy is nonformulary.  Note that she was started on Breo.  Certainly would put her back on her usual Trelegy in preparation for discharge home  Please call us if we can assist over the weekend.  Baltazar Apo, MD, PhD 05/08/2018, 1:50 PM [facility] [phone] or if no answer [phone]

## 2018-05-09 LAB — CULTURE, BAL-QUANTITATIVE W GRAM STAIN: Culture: >=100000 — AB

## 2018-05-09 LAB — CULTURE, BAL-QUANTITATIVE

## 2018-05-09 MED ORDER — LEVOFLOXACIN 500 MG PO TABS: 500.0000 mg | ORAL_TABLET | Freq: Every day | ORAL | 0 refills | Status: DC

## 2018-05-09 NOTE — Care Management Note (Signed)
Case Management Note  Patient Details  Name: TYASHIA MORRISETTE MRN: [id number] Date of Birth: 07/20/1941  Subjective/Objective:   Sepsis 2/2 PNA. Resides with husband.               Maryruth Eve (Spouse) Wynette Jersey (Son)    [phone]     PCP: Shon Baton  Action/Plan: Transition to home with home health services to follow. Husband to bring portable oxygen tank for transportation to home.  Expected Discharge Date:  05/09/18               Expected Discharge Plan:  (Resides with husband)  In-House Referral:  Clinical Social Work  Discharge planning Services     Post Acute Care Choice:  Durable Medical Equipment, Resumption of Svcs/PTA Provider(oxygen/ AHC) Choice offered to:  Patient  DME Arranged:   N/A DME Agency:   N/A  HH Arranged:    [place]:  [facility](pt selected [facility] if needed)  Status of Service:  Completed, signed off  If discussed at [place] of Stay Meetings, dates discussed:    Additional Comments:  Sharin Mons, RN 05/09/2018, 10:47 AM

## 2018-05-09 NOTE — Evaluation (Signed)
Occupational Therapy Evaluation Patient Details Name: Casey Wilkinson MRN: [id number] DOB: 27-Dec-1940 Today's Date: 05/09/2018    History of Present Illness Pt is a 77 y/o female admitted following fever and hemoptysis. Pt with recent bronchoscopy on 7/17. Imaging revealed stable consolidation of R lower lobe. 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    Clinical Impression   PTA, pt was living with her husband and was performing ADLs and light IADLs; pt with home supplemental O2 of 2L. Pt performing ADLs and functional mobility at supervision - Min guard A level for safety. Pt presenting with decreased activity tolerance as seen by fatigue and decreased SpO2 to 83% on 4L O2 during activity. Pt returning to recliner and requiring increased time to return to SpO2 of 90%; providing VCs for purse lip breathing. Recommend dc home with HHOT to optimize safety and independence with ADLs and functional mobility. Answered all pt questions and provided education in preparation for dc later today.     Follow Up Recommendations  Home health OT;Supervision/Assistance - 24 hour    Equipment Recommendations  None recommended by OT    Recommendations for Other Services PT consult     Precautions / Restrictions Precautions Precautions: Fall Restrictions Weight Bearing Restrictions: No      Mobility Bed Mobility               General bed mobility comments: In recliner upon arrival  Transfers Overall transfer level: Needs assistance Equipment used: None Transfers: Sit to/from [facility] Sit to Stand: Min guard         General transfer comment: Min Guard A for safety.     Balance Overall balance assessment: Needs assistance Sitting-balance support: No upper extremity supported;Feet supported Sitting balance-Leahy Scale: Fair     Standing balance support: No upper extremity supported;During functional activity Standing balance-Leahy Scale: Fair                              ADL either performed or assessed with clinical judgement   ADL Overall ADL's : Needs assistance/impaired Eating/Feeding: Set up;Sitting   Grooming: Oral care;Set up;Supervision/safety;Standing   Upper Body Bathing: Set up;Sitting   Lower Body Bathing: Set up;Supervison/ safety;Sit to/from stand   Upper Body Dressing : Set up;Sitting   Lower Body Dressing: Set up;Supervision/safety;Sit to/from stand   Toilet Transfer: Min guard;Ambulation(Simulated to recliner)           Functional mobility during ADLs: Min guard General ADL Comments: Pt presenting with decreased acitvity tolerance and strength. Pt performing grooming task at sink and SpO2 dropping to 83% on 4L O2. Educating pt on purse lip breathing and pt requiring increased time to return to SpO2 in 90s on 4L. Providing pt with handout and education on energy conservation. Discussed implimentation at home. Pt verablized ways she already uses techiques and new ways to incorporate them     Vision Baseline Vision/History: Wears glasses Patient Visual Report: No change from baseline       Perception     Praxis      Pertinent Vitals/Pain Pain Assessment: No/denies pain     Hand Dominance Right   Extremity/Trunk Assessment Upper Extremity Assessment Upper Extremity Assessment: Generalized weakness   Lower Extremity Assessment Lower Extremity Assessment: Generalized weakness   Cervical / Trunk Assessment Cervical / Trunk Assessment: Kyphotic   Communication Communication Communication: No difficulties   Cognition Arousal/Alertness: Awake/alert Behavior During Therapy: [facility]  for tasks assessed/performed Overall Cognitive Status: Within Functional Limits for tasks assessed                                     General Comments  Providing handout on EC. Pt dropping to 83% SpO2 on 4L during acitvity and reqired increased time to return to SpO2 of 90s.     Exercises     Shoulder Instructions      Home Living Family/patient expects to be discharged to:: Private residence Living Arrangements: Spouse/significant other Available Help at Discharge: Family;Available 24 hours/day Type of Home: House Home Access: Stairs to enter [employer] of Steps: 2 Entrance Stairs-Rails: Right Home Layout: One level     Bathroom Shower/Tub: Tub/shower unit;Walk-in shower   Bathroom Toilet: Handicapped height     Home Equipment: Tub bench;Walker - 2 wheels;Cane - single point;Other (comment);Bedside commode(oxygen )   Additional Comments: 2L home O2      Prior Functioning/Environment Level of Independence: Independent        Comments: Husband performing IADLs and pt performing ADLs and light IADLs        OT Problem List: Decreased activity tolerance;Impaired balance (sitting and/or standing);Decreased knowledge of precautions;Decreased knowledge of use of DME or AE;Cardiopulmonary status limiting activity      OT Treatment/Interventions:      OT Goals(Current goals can be found in the care plan section) Acute Rehab OT Goals Patient Stated Goal: "to be able to breathe better"  OT Goal Formulation: All assessment and education complete, DC therapy  OT Frequency:     Barriers to D/C:            Co-evaluation              AM-PAC PT "6 Clicks" Daily Activity     Outcome Measure Help from another person eating meals?: None Help from another person taking care of personal grooming?: None Help from another person toileting, which includes using toliet, bedpan, or urinal?: A Little Help from another person bathing (including washing, rinsing, drying)?: A Little Help from another person to put on and taking off regular upper body clothing?: None Help from another person to put on and taking off regular lower body clothing?: A Little 6 Click Score: 21   End of Session Equipment Utilized During Treatment: Oxygen Nurse  Communication: Mobility status;Other (comment)(SpO2)  Activity Tolerance: Patient tolerated treatment well Patient left: in chair;with call bell/phone within reach;with chair alarm set  OT Visit Diagnosis: Unsteadiness on feet (R26.81);Other abnormalities of gait and mobility (R26.89);Muscle weakness (generalized) (M62.81)                Time: 0932-6712 OT Time Calculation (min): 22 min Charges:  OT General Charges $OT Visit: 1 Visit OT Evaluation $OT Eval Moderate Complexity: 1 Mod G-Codes:     [place] MSOT, OTR/L Acute Rehab Pager: [phone] Office: [place] 05/09/2018, 1:51 PM

## 2018-05-09 NOTE — Clinical Social Work Note (Signed)
Clinical Social Worker met with patient at bedside regarding possible ST-SNF placement at discharge.  Patient states that she is not willing to discharge to SNF and is planning to discharge home with husband and home health.  Patient home oxygen is currently managed by [place] [place] and patient plans to resume all recommended services with [facility].  Patient address and phone number correct in the chart.  CSW notified RNCM who will make arrangements prior to discharge.  Clinical Social Worker will sign off for now as social work intervention is no longer needed. Please consult [place] again if new need arises.  Barbette Or, LCSW (weekend coverage) [phone]

## 2018-05-09 NOTE — Discharge Summary (Signed)
Physician Discharge Summary  [name] IRS:[id number] DOB: Nov 24, 1940 DOA: 05/06/2018  PCP: Shon Baton, MD  Admit date: 05/06/2018 Discharge date: 05/09/2018  Time spent: 40 minutes  Recommendations for Outpatient Follow-up:  1. PCP x1 week 2. Oncology 1 to 2 weeks for new cancer work-up adenocarcinoma of the lung 3. PCCM Dr. Lamonte Sakai 1 to 2 weeks 4.  Discharge Diagnoses:  Principal Problem:   Sepsis due to pneumonia [facility]) Active Problems:   Adenocarcinoma of right lung ([place])   COPD (chronic obstructive pulmonary disease) ([place])   Discoid lupus erythematosus   HTN (hypertension)   Chronic respiratory failure ([place])   Discharge Condition: Stable and improved  Diet recommendation: Cardiac diet  Filed Weights   05/06/18 2145 05/09/18 0422  Weight: 74.4 kg (164 lb) 69.1 kg (152 lb 5.4 oz)    History of present illness:  Casey Wilkinson is a 77 y.o. female with a history of tobacco use, 2L-O2-dependent COPD, discoid lupus on MTX, HTN, and chronic anemia who presented to the ED with fever following bronchoscopy. She had 2 courses of antibiotics for RLL infiltrate without resolution confirmed by recent CT with ongoing right chest pain and was evaluated by bronchoscopy 7/17. After coming home after the procedure she had some low volume hemoptysis which is not new, but then complained of generalized weakness and high fever at home for which she presented to the ED. In the ED she was febrile to 103.26F, requiring 4L O2, WBC 16k, and CXR demonstrating stable RLL consolidation. She was put in IV antibiotics and admitted with pulmonology consultation pending.  Unfortunately transbronchial biopsies are showing adenocarcinoma cells consistent with non-small cell lung cancer.  Patient is clinically improved with IV vancomycin and cefepime.  All of her culture data from her bronchial lavage has been negative thus far.  Patient will be transition to to completed a course of oral Levaquin.  She will  need to follow-up with oncology for complete work-up of her new diagnosis of cancer which will include PET scanning and MRI Brain.  By the time of discharge patient was back to her chronic 2 L of oxygen continuous and was ambulating in the hallway with minimal assistance.  We will set up home health physical therapy for her at home.  Hospital Course:  Sepsis due to RLL pneumonia, persistent RLL infiltrate: Also with new diagnosis of adenocarcinoma of the lung  causing her recurrent pneumonia.  Onset followed bronchoscopy by only a few hours. PCT only 0.18, leukocytosis improved, suspect significant dehydration based on drop in all cell lines with IVF's.  -Discharged on a course of Levaquin MRSA PCR was negative.  Culture data negative thus far. - Sputum, blood cultures  negative thus far - BAL, transbronchial brushings and Bx performed 7/17.  Cultures negative thus far.  Adenocarcinoma cells present. - Pulmonology consulted.    Patient clinically improved back to baseline status.  Acute blood loss anemia on anemia of chronic disease/iron deficiency: With hemoptysis, albeit not large volume currently, and >3L IVF's.  -H&H stable throughout hospitalization without any overt bleeding in the last 24 hours transfuse for hgb < 7.  Acute on chronic hypoxic respiratory failure, COPD: No wheezing on exam.  - Supplemental oxygen to maintain SpO2 88-95%, discharge back to baseline 2 L nasal cannula with good O2 sats - Bronchodilators   Consultations:  Pulmonology  Discharge Exam: Vitals:   05/09/18 0422 05/09/18 0744  BP: (!) 156/68   Pulse: 88   Resp: 18   Temp:  98.5 F (36.9 C)   SpO2: 97% 97%    General: Alert and oriented x4 no apparent distress cooperative and friendly Cardiovascular: Regular rate and rhythm without murmurs rubs or gallops Respiratory: Clear to auscultation bilaterally no wheezes rhonchi rales  Discharge Instructions   Discharge Instructions    Diet - low sodium  heart healthy   Complete by:  As directed    Increase activity slowly   Complete by:  As directed      Allergies as of 05/09/2018      Reactions   Prochlorperazine Edisylate Nausea Only, Other (See Comments)   **COMPAZINE**   Stroke-like symptoms   Sulfamethoxazole Nausea Only, Other (See Comments)   Pt taking Methotrexate, Sulfur drugs could cause SEVERE fatal reaction.   Doxycycline Hives      Medication List    TAKE these medications   ABC PLUS SENIOR Tabs Take 1 tablet by mouth every morning.   acetaminophen 650 MG CR tablet Commonly known as:  TYLENOL Take 650 mg by mouth every 8 (eight) hours as needed for pain.   albuterol 108 (90 Base) MCG/ACT inhaler Commonly known as:  PROAIR HFA Inhale 2 puffs into the lungs every 4 (four) hours as needed for wheezing or shortness of breath.   atorvastatin 10 MG tablet Commonly known as:  LIPITOR Take 10 mg by mouth every morning.   Calcium Carbonate-Vitamin D 600-400 MG-UNIT tablet Take 1 tablet by mouth every morning.   docusate sodium 100 MG capsule Commonly known as:  COLACE Take 100 mg by mouth at bedtime.   folic acid 1 MG tablet Commonly known as:  FOLVITE Take 1 mg by mouth every morning.   HYDROcodone-acetaminophen 5-325 MG tablet Commonly known as:  NORCO/VICODIN Take 1 tablet by mouth every 6 (six) hours as needed for severe pain.   HYDROcodone-homatropine 5-1.5 MG/5ML syrup Commonly known as:  HYCODAN Take 5 mLs by mouth at bedtime and may repeat dose one time if needed.   levofloxacin 500 MG tablet Commonly known as:  LEVAQUIN Take 1 tablet (500 mg total) by mouth daily for 10 days.   Loratadine 5 MG/5ML Soln Take 10 mLs by mouth at bedtime.   POLY-IRON 150 150 MG capsule Generic drug:  iron polysaccharides Take 11m by mouth once daily   polyethylene glycol packet Commonly known as:  MIRALAX / GLYCOLAX Take 17 g by mouth daily as needed for mild constipation.   Potassium 99 MG Tabs Take 99  mg by mouth daily.   TRELEGY ELLIPTA 100-62.5-25 MCG/INH Aepb Generic drug:  Fluticasone-Umeclidin-Vilant USE 1 INHALATION DAILY   valsartan-hydrochlorothiazide 160-12.5 MG tablet Commonly known as:  DIOVAN-HCT Take 1 tablet by mouth every morning.   vitamin B-12 500 MCG tablet Commonly known as:  CYANOCOBALAMIN Take 2,500 mcg by mouth every morning.      Allergies  Allergen Reactions  . Prochlorperazine Edisylate Nausea Only and Other (See Comments)    **COMPAZINE**   Stroke-like symptoms  . Sulfamethoxazole Nausea Only and Other (See Comments)    Pt taking Methotrexate, Sulfur drugs could cause SEVERE fatal reaction.  . Doxycycline Hives   Follow-up Information    RShon Baton MD Follow up in 1 week(s).   Specialty:  Internal Medicine Contact information: [address] [phone]       Oncology, Physician, MD Follow up in 1 week(s).   Specialty:  Oncology Contact information: [id number]  Collene Gobble, MD Follow up in 1 week(s).   Specialty:  Pulmonary Disease Contact information: [address] [address] [phone]        [facility], [facility] Follow up.   Specialty:  Home Health Services Why:  home health arranged Contact information: [address] [place] [address] [phone]            The results of significant diagnostics from this hospitalization (including imaging, microbiology, ancillary and laboratory) are listed below for reference.    Significant Diagnostic Studies: Dg Chest 2 View  Result Date: 04/30/2018 CLINICAL DATA:  77 year old female with dense right lower lobe consolidation/pneumonia. Subsequent encounter. EXAM: CHEST - 2 VIEW COMPARISON:  Chest CT 04/22/2018 and earlier. FINDINGS: Compared to radiographs on 02/26/2018, confluent right lung base opacity has minimally improved. Lung parenchyma elsewhere remains  stable and clear. Upper lobe emphysema again evident. Stable cardiac size and mediastinal contours. Visualized tracheal air column is within normal limits. No pneumothorax. Absence of right pleural effusion better demonstrated on the recent CT. Osteopenia. Calcified aortic atherosclerosis. Negative visible bowel gas pattern. IMPRESSION: 1. Continued dense right lung base consolidation. No significant improvement since 02/26/2018. 2. No new cardiopulmonary abnormality.  Emphysema. Electronically Signed   By: Genevie Ann M.D.   On: 04/30/2018 15:06   Ct Chest Wo Contrast  Result Date: 04/22/2018 CLINICAL DATA:  History of lupus with right lower lobe pneumonia. EXAM: CT CHEST WITHOUT CONTRAST TECHNIQUE: Multidetector CT imaging of the chest was performed following the standard protocol without IV contrast. COMPARISON:  03/17/2018 FINDINGS: Cardiovascular: Heart size upper normal. Trace pericardial effusion is similar. Coronary artery calcification is evident. Atherosclerotic calcification is noted in the wall of the thoracic aorta. Mediastinum/Nodes: Scattered small mediastinal lymph nodes again noted. No evidence for gross hilar lymphadenopathy although assessment is limited by the lack of intravenous contrast on today's study. The esophagus has normal imaging features. There is no axillary lymphadenopathy. Lungs/Pleura: Centrilobular emphysema again noted. Dense right lower lobe airspace consolidation with air bronchograms is not appreciably changed in the interval. The medial component of differential attenuation is similar to prior and has apparent cavitation. 7 mm left lower lobe pulmonary nodule (5:99) is unchanged. Architectural distortion/scarring in the medial left base is stable. 5 mm left lung nodule (5:98) is unchanged. Upper Abdomen: Atherosclerotic calcification noted abdominal aorta. Musculoskeletal: No worrisome lytic or sclerotic osseous abnormality. IMPRESSION: 1. No substantial interval change in the  dense right lower lobe consolidative disease. 2. Stable pulmonary nodules left lower lung. 3. Emphysema 4.  Aortic Atherosclerois (ICD10-170.0) Electronically Signed   By: Misty Stanley M.D.   On: 04/22/2018 14:48   Dg Chest Portable 1 View  Result Date: 05/06/2018 CLINICAL DATA:  Chest pain EXAM: PORTABLE CHEST 1 VIEW COMPARISON:  05/06/2018 at 2:42 p.m. FINDINGS: Right lower lobe consolidation, stable. Atherosclerotic calcification of the aortic arch. Heart size within normal limits. Tapering of the peripheral pulmonary vasculature favors emphysema. IMPRESSION: 1. Stable consolidation in the right lower lobe. 2. Aortic Atherosclerosis (ICD10-I70.0) and Emphysema (ICD10-J43.9). Electronically Signed   By: Van Clines M.D.   On: 05/06/2018 22:11   Dg Chest Port 1 View  Result Date: 05/06/2018 CLINICAL DATA:  Followup bronchoscopy and biopsy. EXAM: PORTABLE CHEST 1 VIEW COMPARISON:  04/30/2018 FINDINGS: No pneumothorax or hemothorax. Emphysema and pulmonary scarring. Left lung remains clear. Worsened opacity in the right lower lung which could be due to lavage or hemorrhage. IMPRESSION: Worsened opacity in the right lower lung  which could be due to lavage or postprocedure hemorrhage. Background emphysema and scarring. Electronically Signed   By: Nelson Chimes M.D.   On: 05/06/2018 15:17   Dg C-arm Bronchoscopy  Result Date: 05/06/2018 C-ARM BRONCHOSCOPY: Fluoroscopy was utilized by the requesting physician.  No radiographic interpretation.    Microbiology: Recent Results (from the past 240 hour(s))  Culture, bal-quantitative     Status: Abnormal   Collection Time: 05/06/18  3:08 PM  Result Value Ref Range Status   Specimen Description BRONCHIAL ALVEOLAR LAVAGE  Final   Special Requests NONE  Final   Gram Stain   Final    ABUNDANT WBC PRESENT,BOTH PMN AND MONONUCLEAR RARE GRAM POSITIVE COCCI    Culture (A)  Final    >=100,000 COLONIES/mL Consistent with normal respiratory  flora. Performed at [facility], [place] [address], [place], [place] [address]    Report Status 05/09/2018 FINAL  Final  Acid Fast Smear (AFB)     Status: None   Collection Time: 05/06/18  3:08 PM  Result Value Ref Range Status   AFB Specimen Processing Concentration  Final   Acid Fast Smear Negative  Final    Comment: (NOTE) Performed At: [facility] [place], [place] [id number] Rush Farmer MD KN:[id number]    Source (AFB) BRONCHIAL ALVEOLAR LAVAGE  Final    Comment: RLL  Culture, blood (Routine x 2)     Status: None (Preliminary result)   Collection Time: 05/06/18  9:57 PM  Result Value Ref Range Status   Specimen Description BLOOD RIGHT ANTECUBITAL  Final   Special Requests   Final    BOTTLES DRAWN AEROBIC AND ANAEROBIC Blood Culture results may not be optimal due to an inadequate volume of blood received in culture bottles   Culture   Final    NO GROWTH 2 DAYS Performed at [facility], [place] [address], [place], [place] [address]    Report Status PENDING  Incomplete  Culture, blood (Routine x 2)     Status: None (Preliminary result)   Collection Time: 05/06/18 10:05 PM  Result Value Ref Range Status   Specimen Description BLOOD RIGHT WRIST  Final   Special Requests   Final    BOTTLES DRAWN AEROBIC AND ANAEROBIC Blood Culture adequate volume   Culture   Final    NO GROWTH 2 DAYS Performed at [facility], [place] [address], [place], [place] [address]    Report Status PENDING  Incomplete  Urine Culture     Status: None   Collection Time: 05/07/18  7:02 AM  Result Value Ref Range Status   Specimen Description URINE, RANDOM  Final   Special Requests NONE  Final   Culture   Final    NO GROWTH Performed at [facility], [place] [address] [place], [place] [address]    Report Status 05/08/2018 FINAL  Final  MRSA PCR Screening     Status: None   Collection Time: 05/08/18  7:38 AM  Result Value Ref Range Status   MRSA by PCR  NEGATIVE NEGATIVE Final    Comment:        The GeneXpert MRSA Assay (FDA approved for NASAL specimens only), is one component of a comprehensive MRSA colonization surveillance program. It is not intended to diagnose MRSA infection nor to guide or monitor treatment for MRSA infections. Performed at [facility], [place] [address], [place], [place] [address]      Labs: Basic  Metabolic Panel: Recent Labs  Lab 05/06/18 2155 05/07/18 0524 05/08/18 0433  NA 136 141 139  K 4.1 3.7 3.4*  CL 102 111 108  CO2 _0 GLUCOSE 146* 125* 83  BUN 24* 19 10  CREATININE 0.99 0.85 0.62  CALCIUM 9.4 8.0* 8.4*   Liver Function Tests: Recent Labs  Lab 05/06/18 2155  AST 25  ALT 17  ALKPHOS 115  BILITOT 1.2  PROT 7.9  ALBUMIN 3.1*   No results for input(s): LIPASE, AMYLASE in the last 168 hours. No results for input(s): AMMONIA in the last 168 hours. CBC: Recent Labs  Lab 05/06/18 2155 05/07/18 0524 05/07/18 1516 05/08/18 0433  WBC 16.0* 10.6*  --  7.3  NEUTROABS 14.4*  --   --   --   HGB 9.6* 7.7* 8.1* 7.6*  HCT 31.2* 24.7* 26.3* 24.4*  MCV 95.4 95.4  --  94.6  PLT 269 169  --  183   Cardiac Enzymes: No results for input(s): CKTOTAL, CKMB, CKMBINDEX, TROPONINI in the last 168 hours. BNP: BNP (last 3 results) No results for input(s): BNP in the last 8760 hours.  ProBNP (last 3 results) No results for input(s): PROBNP in the last 8760 hours.  CBG: No results for input(s): GLUCAP in the last 168 hours.     Signed:  Phillips Grout MD.  [facility] 05/09/2018, 11:18 AM

## 2018-05-09 NOTE — Progress Notes (Signed)
Pharmacy Antibiotic Note  Casey Wilkinson is a 77 y.o. female admitted on 05/06/2018 with pneumonia.  Pharmacy has been consulted for vancomycin dosing.  Day # 4 of Vancomycin / Cefepime - MRSA PCR -  Plan: Consider stopping vancomycin Continue Cefepime 1 gram iv Q 12    Height: 5\' 5"  (165.1 cm) Weight: 152 lb 5.4 oz (69.1 kg) IBW/kg (Calculated) : 57  Temp (24hrs), Avg:98.4 F (36.9 C), Min:97.9 F (36.6 C), Max:99.1 F (37.3 C)  Recent Labs  Lab 05/06/18 2155 05/06/18 2204 05/07/18 0524 05/08/18 0433  WBC 16.0*  --  10.6* 7.3  CREATININE 0.99  --  0.85 0.62  LATICACIDVEN  --  1.41  --   --     Estimated Creatinine Clearance: 56.5 mL/min (by C-G formula based on SCr of 0.62 mg/dL).    Allergies  Allergen Reactions  . Prochlorperazine Edisylate Nausea Only and Other (See Comments)    **COMPAZINE**   Stroke-like symptoms  . Sulfamethoxazole Nausea Only and Other (See Comments)    Pt taking Methotrexate, Sulfur drugs could cause SEVERE fatal reaction.  . Doxycycline Hives     Thank you for allowing pharmacy to be a part of this patient's care. Anette Guarneri, PharmD [phone]  05/09/2018 11:17 AM

## 2018-05-09 NOTE — Progress Notes (Signed)
Physical Therapy Treatment Patient Details Name: Casey Wilkinson MRN: [id number] DOB: 1941-08-26 Today's Date: 05/09/2018    History of Present Illness Pt is a 77 y/o female admitted following fever and hemoptysis. Pt with recent bronchoscopy on 7/17. Imaging revealed stable consolidation of R lower lobe. 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     PT Comments     Reassessed patient today and updated d/c plan to home with HHPT. Patient ambulating 150 feet with min guard using walker. Decrease to 88% SpO2 on 4L O2 with mobility but able to increase back to 90% SpO2 with one standing rest break and instructions for pursed lip breathing. Will continue to progress endurance.   Follow Up Recommendations  Supervision for mobility/OOB;Home health PT     Equipment Recommendations    None recommended by PT.    Recommendations for Other Services       Precautions / Restrictions Precautions Precautions: Fall Restrictions Weight Bearing Restrictions: No    Mobility  Bed Mobility Overal bed mobility: Modified Independent Bed Mobility: Supine to Sit     Supine to sit: Modified independent (Device/Increase time)     General bed mobility comments: Increased time to reach edge of bed but no physical assistance required  Transfers Overall transfer level: Needs assistance Equipment used: None Transfers: Sit to/from [facility] Sit to Stand: Min assist         General transfer comment: Min assist to steady upon standing but then able to achieve static standing with supervision  Ambulation/Gait Ambulation/Gait assistance: Min guard Gait Distance (Feet): 150 Feet Assistive device: Rolling walker (2 wheeled) Gait Pattern/deviations: Step-through pattern;Decreased stride length Gait velocity: decreased   General Gait Details: Received patient on 4L O2 with SpO2 97%, decrease to 88% SpO2 with mobility but able to increase back to 90% SpO2 with one  standing rest break and instructions for pursed lip breathing.    Stairs             Wheelchair Mobility    Modified Rankin (Stroke Patients Only)       Balance Overall balance assessment: Needs assistance Sitting-balance support: No upper extremity supported;Feet supported Sitting balance-Leahy Scale: Good     Standing balance support: No upper extremity supported;During functional activity Standing balance-Leahy Scale: Fair                              Cognition Arousal/Alertness: Awake/alert Behavior During Therapy: WFL for tasks assessed/performed Overall Cognitive Status: Within Functional Limits for tasks assessed                                        Exercises      General Comments        Pertinent Vitals/Pain Pain Assessment: No/denies pain    Home Living                      Prior Function            PT Goals (current goals can now be found in the care plan section) Acute Rehab PT Goals Patient Stated Goal: "to be able to breathe better"  PT Goal Formulation: With patient Time For Goal Achievement: 05/22/18 Potential to Achieve Goals: Good Progress towards PT goals: Progressing toward goals    Frequency    Min  3X/week      PT Plan Discharge plan needs to be updated    Co-evaluation              AM-PAC PT "6 Clicks" Daily Activity  Outcome Measure  Difficulty turning over in bed (including adjusting bedclothes, sheets and blankets)?: None Difficulty moving from lying on back to sitting on the side of the bed? : None Difficulty sitting down on and standing up from a chair with arms (e.g., wheelchair, bedside commode, etc,.)?: Unable Help needed moving to and from a bed to chair (including a wheelchair)?: A Little Help needed walking in hospital room?: A Little Help needed climbing 3-5 steps with a railing? : A Little 6 Click Score: 18    End of Session Equipment Utilized During  Treatment: Gait belt;Oxygen Activity Tolerance: Patient tolerated treatment well Patient left: in chair;with call bell/phone within reach;with chair alarm set Nurse Communication: Mobility status PT Visit Diagnosis: Other abnormalities of gait and mobility (R26.89);Difficulty in walking, not elsewhere classified (R26.2);Unsteadiness on feet (R26.81)     Time: 9518-8416 PT Time Calculation (min) (ACUTE ONLY): 26 min  Charges:  $Therapeutic Activity: 23-37 mins                    G Codes:       Ellamae Sia, PT, DPT Acute Rehabilitation Services  Pager: [phone]   Willy Eddy 05/09/2018, 10:34 AM

## 2018-05-11 ENCOUNTER — Telehealth: Payer: Self-pay | Admitting: *Deleted

## 2018-05-11 DIAGNOSIS — C3491 Malignant neoplasm of unspecified part of right bronchus or lung: Secondary | ICD-10-CM

## 2018-05-11 LAB — CULTURE, BLOOD (ROUTINE X 2)
CULTURE: NO GROWTH
CULTURE: NO GROWTH
Special Requests: ADEQUATE

## 2018-05-11 NOTE — Telephone Encounter (Signed)
Oncology Nurse Navigator Documentation  Oncology Nurse Navigator Flowsheets 05/11/2018  Navigator Location [facility]  Referral date to RadOnc/MedOnc 05/11/2018  Navigator Encounter Type Telephone/I received referral on Casey Wilkinson today.  She states she has been trying to call [facility] per her PCP but have been unable to reach them to get an appt. I gave her an appt here at the cancer center in [place] if she was ok with this. She was and verbalized understanding of appt time and place.   Telephone Outgoing Call  Treatment Phase Pre-Tx/Tx Discussion  Barriers/Navigation Needs Coordination of Care  Interventions Coordination of Care  Coordination of Care Appts  Acuity Level 2  Time Spent with Patient 30

## 2018-05-12 ENCOUNTER — Inpatient Hospital Stay (HOSPITAL_COMMUNITY)
Admission: EM | Admit: 2018-05-12 | Discharge: 2018-05-23 | DRG: 193 | Disposition: A | Discharge status: Skilled Nursing Facility | Payer: Medicare Other | Attending: Internal Medicine | Admitting: Internal Medicine

## 2018-05-12 ENCOUNTER — Other Ambulatory Visit: Payer: Self-pay

## 2018-05-12 ENCOUNTER — Telehealth: Payer: Self-pay | Admitting: Emergency Medicine

## 2018-05-12 ENCOUNTER — Emergency Department (HOSPITAL_COMMUNITY): Payer: Medicare Other

## 2018-05-12 ENCOUNTER — Encounter (HOSPITAL_COMMUNITY): Payer: Self-pay

## 2018-05-12 DIAGNOSIS — Z9841 Cataract extraction status, right eye: Secondary | ICD-10-CM

## 2018-05-12 DIAGNOSIS — Z961 Presence of intraocular lens: Secondary | ICD-10-CM | POA: Diagnosis present

## 2018-05-12 DIAGNOSIS — R278 Other lack of coordination: Secondary | ICD-10-CM | POA: Diagnosis not present

## 2018-05-12 DIAGNOSIS — Z820 Family history of epilepsy and other diseases of the nervous system: Secondary | ICD-10-CM | POA: Diagnosis not present

## 2018-05-12 DIAGNOSIS — T380X5A Adverse effect of glucocorticoids and synthetic analogues, initial encounter: Secondary | ICD-10-CM | POA: Diagnosis not present

## 2018-05-12 DIAGNOSIS — Z888 Allergy status to other drugs, medicaments and biological substances status: Secondary | ICD-10-CM | POA: Diagnosis not present

## 2018-05-12 DIAGNOSIS — J440 Chronic obstructive pulmonary disease with acute lower respiratory infection: Secondary | ICD-10-CM | POA: Diagnosis not present

## 2018-05-12 DIAGNOSIS — J441 Chronic obstructive pulmonary disease with (acute) exacerbation: Secondary | ICD-10-CM | POA: Diagnosis present

## 2018-05-12 DIAGNOSIS — J9621 Acute and chronic respiratory failure with hypoxia: Secondary | ICD-10-CM | POA: Diagnosis present

## 2018-05-12 DIAGNOSIS — Z9842 Cataract extraction status, left eye: Secondary | ICD-10-CM | POA: Diagnosis not present

## 2018-05-12 DIAGNOSIS — R0902 Hypoxemia: Secondary | ICD-10-CM | POA: Diagnosis not present

## 2018-05-12 DIAGNOSIS — M6281 Muscle weakness (generalized): Secondary | ICD-10-CM | POA: Diagnosis not present

## 2018-05-12 DIAGNOSIS — Z8349 Family history of other endocrine, nutritional and metabolic diseases: Secondary | ICD-10-CM | POA: Diagnosis not present

## 2018-05-12 DIAGNOSIS — C3491 Malignant neoplasm of unspecified part of right bronchus or lung: Secondary | ICD-10-CM | POA: Diagnosis not present

## 2018-05-12 DIAGNOSIS — I10 Essential (primary) hypertension: Secondary | ICD-10-CM | POA: Diagnosis present

## 2018-05-12 DIAGNOSIS — C3431 Malignant neoplasm of lower lobe, right bronchus or lung: Secondary | ICD-10-CM | POA: Diagnosis present

## 2018-05-12 DIAGNOSIS — I5030 Unspecified diastolic (congestive) heart failure: Secondary | ICD-10-CM | POA: Diagnosis not present

## 2018-05-12 DIAGNOSIS — Z882 Allergy status to sulfonamides status: Secondary | ICD-10-CM | POA: Diagnosis not present

## 2018-05-12 DIAGNOSIS — C189 Malignant neoplasm of colon, unspecified: Secondary | ICD-10-CM | POA: Diagnosis not present

## 2018-05-12 DIAGNOSIS — Z87891 Personal history of nicotine dependence: Secondary | ICD-10-CM | POA: Diagnosis not present

## 2018-05-12 DIAGNOSIS — Y95 Nosocomial condition: Secondary | ICD-10-CM | POA: Diagnosis present

## 2018-05-12 DIAGNOSIS — Z96652 Presence of left artificial knee joint: Secondary | ICD-10-CM | POA: Diagnosis not present

## 2018-05-12 DIAGNOSIS — E785 Hyperlipidemia, unspecified: Secondary | ICD-10-CM | POA: Diagnosis not present

## 2018-05-12 DIAGNOSIS — R042 Hemoptysis: Secondary | ICD-10-CM | POA: Diagnosis not present

## 2018-05-12 DIAGNOSIS — L4050 Arthropathic psoriasis, unspecified: Secondary | ICD-10-CM | POA: Diagnosis present

## 2018-05-12 DIAGNOSIS — Z881 Allergy status to other antibiotic agents status: Secondary | ICD-10-CM | POA: Diagnosis not present

## 2018-05-12 DIAGNOSIS — J449 Chronic obstructive pulmonary disease, unspecified: Secondary | ICD-10-CM | POA: Diagnosis not present

## 2018-05-12 DIAGNOSIS — D509 Iron deficiency anemia, unspecified: Secondary | ICD-10-CM | POA: Diagnosis not present

## 2018-05-12 DIAGNOSIS — Z8249 Family history of ischemic heart disease and other diseases of the circulatory system: Secondary | ICD-10-CM | POA: Diagnosis not present

## 2018-05-12 DIAGNOSIS — D638 Anemia in other chronic diseases classified elsewhere: Secondary | ICD-10-CM | POA: Diagnosis present

## 2018-05-12 DIAGNOSIS — Z90710 Acquired absence of both cervix and uterus: Secondary | ICD-10-CM

## 2018-05-12 DIAGNOSIS — R0602 Shortness of breath: Secondary | ICD-10-CM

## 2018-05-12 DIAGNOSIS — J189 Pneumonia, unspecified organism: Principal | ICD-10-CM | POA: Diagnosis present

## 2018-05-12 DIAGNOSIS — L930 Discoid lupus erythematosus: Secondary | ICD-10-CM | POA: Diagnosis present

## 2018-05-12 DIAGNOSIS — C3490 Malignant neoplasm of unspecified part of unspecified bronchus or lung: Secondary | ICD-10-CM | POA: Diagnosis not present

## 2018-05-12 DIAGNOSIS — R5383 Other fatigue: Secondary | ICD-10-CM | POA: Diagnosis not present

## 2018-05-12 DIAGNOSIS — R0689 Other abnormalities of breathing: Secondary | ICD-10-CM | POA: Diagnosis not present

## 2018-05-12 DIAGNOSIS — J9610 Chronic respiratory failure, unspecified whether with hypoxia or hypercapnia: Secondary | ICD-10-CM | POA: Diagnosis present

## 2018-05-12 DIAGNOSIS — M159 Polyosteoarthritis, unspecified: Secondary | ICD-10-CM | POA: Diagnosis present

## 2018-05-12 DIAGNOSIS — R000 Tachycardia, unspecified: Secondary | ICD-10-CM | POA: Diagnosis not present

## 2018-05-12 DIAGNOSIS — I6789 Other cerebrovascular disease: Secondary | ICD-10-CM | POA: Diagnosis not present

## 2018-05-12 DIAGNOSIS — R2689 Other abnormalities of gait and mobility: Secondary | ICD-10-CM | POA: Diagnosis not present

## 2018-05-12 DIAGNOSIS — J80 Acute respiratory distress syndrome: Secondary | ICD-10-CM | POA: Diagnosis not present

## 2018-05-12 DIAGNOSIS — Z9981 Dependence on supplemental oxygen: Secondary | ICD-10-CM

## 2018-05-12 DIAGNOSIS — Z85038 Personal history of other malignant neoplasm of large intestine: Secondary | ICD-10-CM

## 2018-05-12 LAB — CBC WITH DIFFERENTIAL/PLATELET
BASOS PCT: 0 %
Basophils Absolute: 0 10*3/uL (ref 0.0–0.1)
Eosinophils Absolute: 0 10*3/uL (ref 0.0–0.7)
Eosinophils Relative: 0 %
HEMATOCRIT: 25.1 % — AB (ref 36.0–46.0)
HEMOGLOBIN: 8.1 g/dL — AB (ref 12.0–15.0)
Lymphocytes Relative: 9 %
Lymphs Abs: 0.9 10*3/uL (ref 0.7–4.0)
MCH: 29.6 pg (ref 26.0–34.0)
MCHC: 32.3 g/dL (ref 30.0–36.0)
MCV: 91.6 fL (ref 78.0–100.0)
MONOS PCT: 4 %
Monocytes Absolute: 0.4 10*3/uL (ref 0.1–1.0)
NEUTROS ABS: 9 10*3/uL — AB (ref 1.7–7.7)
Neutrophils Relative %: 87 %
Platelets: 258 10*3/uL (ref 150–400)
RBC: 2.74 MIL/uL — ABNORMAL LOW (ref 3.87–5.11)
RDW: 14.6 % (ref 11.5–15.5)
WBC: 10.4 10*3/uL (ref 4.0–10.5)

## 2018-05-12 LAB — BASIC METABOLIC PANEL
ANION GAP: 13 (ref 5–15)
BUN: 15 mg/dL (ref 8–23)
CHLORIDE: 98 mmol/L (ref 98–111)
CO2: 26 mmol/L (ref 22–32)
Calcium: 9.4 mg/dL (ref 8.9–10.3)
Creatinine, Ser: 0.7 mg/dL (ref 0.44–1.00)
GFR calc Af Amer: >60 mL/min (ref >60)
GFR calc non Af Amer: >60 mL/min (ref >60)
GLUCOSE: 109 mg/dL — AB (ref 70–99)
POTASSIUM: 4.2 mmol/L (ref 3.5–5.1)
Sodium: 137 mmol/L (ref 135–145)

## 2018-05-12 LAB — BRAIN NATRIURETIC PEPTIDE: B Natriuretic Peptide: 49.7 pg/mL (ref 0.0–100.0)

## 2018-05-12 LAB — I-STAT TROPONIN, ED: Troponin i, poc: 0.05 ng/mL (ref 0.00–0.08)

## 2018-05-12 MED ORDER — FLUTICASONE-UMECLIDIN-VILANT 100-62.5-25 MCG/INH IN AEPB: INHALATION_SPRAY | Freq: Every day | RESPIRATORY_TRACT | Status: DC

## 2018-05-12 MED ORDER — ALBUTEROL SULFATE (2.5 MG/3ML) 0.083% IN NEBU
5.0000 mg | INHALATION_SOLUTION | Freq: Once | RESPIRATORY_TRACT | Status: AC
  Administered 2018-05-12: 5 mg via RESPIRATORY_TRACT
  Filled 2018-05-12: qty 6

## 2018-05-12 MED ORDER — POLYETHYLENE GLYCOL 3350 17 G PO PACK: 17.0000 g | PACK | Freq: Every day | ORAL | Status: DC | PRN

## 2018-05-12 MED ORDER — HYDROCHLOROTHIAZIDE 12.5 MG PO CAPS
12.5000 mg | ORAL_CAPSULE | Freq: Every day | ORAL | Status: DC
  Administered 2018-05-13 – 2018-05-23 (×11): 12.5 mg via ORAL
  Filled 2018-05-12 (×11): qty 1

## 2018-05-12 MED ORDER — FLUTICASONE FUROATE-VILANTEROL 100-25 MCG/INH IN AEPB
1.0000 | INHALATION_SPRAY | Freq: Every day | RESPIRATORY_TRACT | Status: DC
  Administered 2018-05-13: 1 via RESPIRATORY_TRACT
  Filled 2018-05-12: qty 28

## 2018-05-12 MED ORDER — LEVOFLOXACIN 500 MG PO TABS: 500.0000 mg | ORAL_TABLET | Freq: Every day | ORAL | Status: DC

## 2018-05-12 MED ORDER — HYDROCODONE-ACETAMINOPHEN 5-325 MG PO TABS
1.0000 | ORAL_TABLET | Freq: Four times a day (QID) | ORAL | Status: DC | PRN
  Administered 2018-05-13 – 2018-05-22 (×8): 1 via ORAL
  Filled 2018-05-12 (×8): qty 1

## 2018-05-12 MED ORDER — IOPAMIDOL (ISOVUE-370) INJECTION 76%
100.0000 mL | Freq: Once | INTRAVENOUS | Status: AC | PRN
  Administered 2018-05-12: 100 mL via INTRAVENOUS

## 2018-05-12 MED ORDER — FOLIC ACID 1 MG PO TABS
1.0000 mg | ORAL_TABLET | Freq: Every morning | ORAL | Status: DC
  Administered 2018-05-13 – 2018-05-23 (×11): 1 mg via ORAL
  Filled 2018-05-12 (×11): qty 1

## 2018-05-12 MED ORDER — UMECLIDINIUM BROMIDE 62.5 MCG/INH IN AEPB
1.0000 | INHALATION_SPRAY | Freq: Every day | RESPIRATORY_TRACT | Status: DC
  Administered 2018-05-13: 1 via RESPIRATORY_TRACT
  Filled 2018-05-12: qty 7

## 2018-05-12 MED ORDER — ACETAMINOPHEN 325 MG PO TABS
650.0000 mg | ORAL_TABLET | Freq: Three times a day (TID) | ORAL | Status: DC | PRN
  Administered 2018-05-14: 650 mg via ORAL
  Filled 2018-05-12: qty 2

## 2018-05-12 MED ORDER — METHYLPREDNISOLONE SODIUM SUCC 125 MG IJ SOLR
125.0000 mg | Freq: Once | INTRAMUSCULAR | Status: AC
  Administered 2018-05-12: 125 mg via INTRAVENOUS
  Filled 2018-05-12: qty 2

## 2018-05-12 MED ORDER — DOCUSATE SODIUM 100 MG PO CAPS
100.0000 mg | ORAL_CAPSULE | Freq: Every day | ORAL | Status: DC
  Administered 2018-05-13 – 2018-05-22 (×11): 100 mg via ORAL
  Filled 2018-05-12 (×11): qty 1

## 2018-05-12 MED ORDER — ATORVASTATIN CALCIUM 10 MG PO TABS
10.0000 mg | ORAL_TABLET | Freq: Every morning | ORAL | Status: DC
  Administered 2018-05-13 – 2018-05-23 (×11): 10 mg via ORAL
  Filled 2018-05-12 (×11): qty 1

## 2018-05-12 MED ORDER — SODIUM CHLORIDE 0.9 % IV SOLN
1.0000 g | Freq: Two times a day (BID) | INTRAVENOUS | Status: AC
  Administered 2018-05-12 – 2018-05-19 (×15): 1 g via INTRAVENOUS
  Filled 2018-05-12 (×15): qty 1

## 2018-05-12 MED ORDER — HYDROCODONE-HOMATROPINE 5-1.5 MG/5ML PO SYRP
5.0000 mL | ORAL_SOLUTION | Freq: Every evening | ORAL | Status: DC | PRN
  Administered 2018-05-13 – 2018-05-22 (×14): 5 mL via ORAL
  Filled 2018-05-12 (×14): qty 5

## 2018-05-12 MED ORDER — CALCIUM CARBONATE-VITAMIN D 500-200 MG-UNIT PO TABS
1.0000 | ORAL_TABLET | Freq: Every morning | ORAL | Status: DC
  Administered 2018-05-13 – 2018-05-23 (×11): 1 via ORAL
  Filled 2018-05-12 (×11): qty 1

## 2018-05-12 MED ORDER — CYANOCOBALAMIN 500 MCG PO TABS
2500.0000 ug | ORAL_TABLET | Freq: Every morning | ORAL | Status: DC
  Administered 2018-05-13 – 2018-05-23 (×11): 2500 ug via ORAL
  Filled 2018-05-12 (×11): qty 5

## 2018-05-12 MED ORDER — VANCOMYCIN HCL IN DEXTROSE 1-5 GM/200ML-% IV SOLN
1000.0000 mg | INTRAVENOUS | Status: DC
  Administered 2018-05-13: 1000 mg via INTRAVENOUS
  Filled 2018-05-12: qty 200

## 2018-05-12 MED ORDER — IOPAMIDOL (ISOVUE-370) INJECTION 76%
INTRAVENOUS | Status: AC
  Filled 2018-05-12: qty 100

## 2018-05-12 MED ORDER — POLYSACCHARIDE IRON COMPLEX 150 MG PO CAPS
150.0000 mg | ORAL_CAPSULE | Freq: Every day | ORAL | Status: DC
  Administered 2018-05-13 – 2018-05-23 (×11): 150 mg via ORAL
  Filled 2018-05-12 (×11): qty 1

## 2018-05-12 MED ORDER — IRBESARTAN 150 MG PO TABS
150.0000 mg | ORAL_TABLET | Freq: Every day | ORAL | Status: DC
  Administered 2018-05-13 – 2018-05-23 (×11): 150 mg via ORAL
  Filled 2018-05-12 (×11): qty 1

## 2018-05-12 MED ORDER — ALBUTEROL SULFATE (2.5 MG/3ML) 0.083% IN NEBU
2.5000 mg | INHALATION_SOLUTION | RESPIRATORY_TRACT | Status: DC | PRN
  Administered 2018-05-13 (×2): 2.5 mg via RESPIRATORY_TRACT
  Filled 2018-05-12 (×2): qty 3

## 2018-05-12 MED ORDER — LORATADINE 10 MG PO TABS
10.0000 mg | ORAL_TABLET | Freq: Every day | ORAL | Status: DC
  Administered 2018-05-13 – 2018-05-22 (×11): 10 mg via ORAL
  Filled 2018-05-12 (×11): qty 1

## 2018-05-12 MED ORDER — ENOXAPARIN SODIUM 40 MG/0.4ML ~~LOC~~ SOLN: 40.0000 mg | SUBCUTANEOUS | Status: DC

## 2018-05-12 MED ORDER — ADULT MULTIVITAMIN W/MINERALS CH
1.0000 | ORAL_TABLET | Freq: Every morning | ORAL | Status: DC
  Administered 2018-05-13 – 2018-05-23 (×11): 1 via ORAL
  Filled 2018-05-12 (×11): qty 1

## 2018-05-12 MED ORDER — VALSARTAN-HYDROCHLOROTHIAZIDE 160-12.5 MG PO TABS: 1.0000 | ORAL_TABLET | Freq: Every morning | ORAL | Status: DC

## 2018-05-12 MED ORDER — VANCOMYCIN HCL 10 G IV SOLR
1500.0000 mg | Freq: Once | INTRAVENOUS | Status: AC
  Administered 2018-05-13: 1500 mg via INTRAVENOUS
  Filled 2018-05-12: qty 1500

## 2018-05-12 NOTE — Telephone Encounter (Signed)
Thank you :)

## 2018-05-12 NOTE — Telephone Encounter (Signed)
Called and spoke with pt's son Casey Wilkinson regarding pt not feeling well Pt reports O2 Sats 80-82% when walking 8-92ft, SOB with exertion, shallow breathing, chest tightness, productive cough-blood (tablespoon at one time daily), upper back pain, no swelling no fever or chills. Advised pt and pt's son to go to ER immediately  Pt's son advised they are calling EMS and going to ER today Nothing further needed.  Routing message to RB for review.

## 2018-05-12 NOTE — ED Provider Notes (Signed)
[facility] DEPT Provider Note   CSN: [id number] Arrival date & time: 05/12/18  1709     History   Chief Complaint Chief Complaint  Patient presents with  . Shortness of Breath    HPI Casey Wilkinson is a 77 y.o. female.  Patient is a 77 year old female who presents with shortness of breath.  She has a history of COPD.  She is also had a persistent right lower lung consolidation since May.  She is been treated for pneumonia.  She had a recent admission for sepsis/pneumonia and was discharged 3 days ago.  During that admission she had a bronchoscopy which showed adenocarcinoma.  She has an appointment with oncology tomorrow.  She at baseline requires 2 L/min of oxygen but since her recent discharge has been on 3 L.  She states she has been progressively more short of breath over the last few days.  She gets short of breath walking very short distances, just a few feet.  When she walked today just about 4 to 5 feet her oxygen saturation dropped to 82% on her 3 L.  She does have some discomfort in her right lower chest and back.  No known fevers.  She is currently still on Levaquin.  No vomiting.  No leg swelling.     Past Medical History:  Diagnosis Date  . Anemia   . Arthritis    "all over"  . Carotid artery stenosis    right side followed by [facility]- 65%  . Colitis April 2016  . Colon cancer ([place])   . COPD (chronic obstructive pulmonary disease) ([place])   . Discoid lupus erythematosus   . Diverticulitis April 2016   and Colitis  . History of blood transfusion 01/2015, 06/2015   colectomy  . Hypertension   . Psoriatic arthritis ([place])   . Shortness of breath dyspnea    with exertion  . Squamous cell carcinoma, leg     Patient Active Problem List   Diagnosis Date Noted  . Adenocarcinoma of right lung ([place])   . Sepsis due to pneumonia ([place]) 05/06/2018  . S/P bronchoscopy with biopsy   . Pleuritic pain 04/30/2018  . Hemoptysis 04/30/2018  . Right  lower lobe pneumonia ([place]) 03/31/2018  . Chronic rhinitis 11/04/2017  . Pain and swelling of right lower leg 05/05/2017  . Chronic respiratory failure ([place]) 03/12/2017  . Pain of right hand 01/28/2017  . 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 Degenerative arthritis of left knee 01/31/2016  . 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 Nausea with vomiting   . Acute ischemic colitis ([place])   . Rectal bleeding   . Abdominal pain   . Blood in stool   . 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 Colitis 02/06/2015  . Abdominal mass 02/06/2015  . HTN (hypertension) 02/06/2015  . Peripheral vascular disease ([place]) 02/24/2014  . LUPUS ERYTHEMATOSUS, DISCOID 11/21/2009  . Essential hypertension 12/08/2008  . COPD (chronic obstructive pulmonary disease) ([place]) 12/08/2008    Past Surgical History:  Procedure Laterality Date  . APPENDECTOMY  06/2015  . CATARACT EXTRACTION W/ INTRAOCULAR LENS  IMPLANT, BILATERAL Bilateral 2017  . COLONOSCOPY  06/23/15  . COLONOSCOPY W/ POLYPECTOMY    . CYSTOCELE REPAIR  2006  . DILATION AND CURETTAGE OF UTERUS  X 2  . EXCISIONAL HEMORRHOIDECTOMY  2006  . JOINT REPLACEMENT    . LAPAROSCOPIC PARTIAL COLECTOMY N/A 06/23/2015   Procedure: LAPAROSCOPIC ILEOCECETOMY;  Surgeon: Excell Seltzer, MD;  Location:  [facility];  Service: General;  Laterality: N/A;  . MOHS SURGERY Left    "basal; LLE"  . RECTOCELE REPAIR  2006  . SQUAMOUS CELL CARCINOMA EXCISION  "several"   "legs mostly"  . TOTAL KNEE ARTHROPLASTY Left 01/31/2016   Procedure: LEFT TOTAL KNEE ARTHROPLASTY;  Surgeon: Leandrew Koyanagi, MD;  Location: [facility];  Service: Orthopedics;  Laterality: Left;  Marland Kitchen VAGINAL HYSTERECTOMY  1968   "total"  . VIDEO BRONCHOSCOPY Bilateral 05/06/2018   Procedure: VIDEO BRONCHOSCOPY WITH FLUORO;  Surgeon: Collene Gobble, MD;  Location: [facility] ENDOSCOPY;  Service: Cardiopulmonary;  Laterality: Bilateral;     OB History   None      Home Medications    Prior to Admission  medications   Medication Sig Start Date End Date Taking? Authorizing Provider  acetaminophen (TYLENOL) 650 MG CR tablet Take 1,300 mg by mouth every 8 (eight) hours as needed for pain.    Yes [provider]  albuterol (PROAIR HFA) 108 (90 Base) MCG/ACT inhaler Inhale 2 puffs into the lungs every 4 (four) hours as needed for wheezing or shortness of breath. 03/31/18  Yes Collene Gobble, MD  atorvastatin (LIPITOR) 10 MG tablet Take 10 mg by mouth every morning.  02/14/14  Yes [provider]  Calcium Carbonate-Vitamin D 600-400 MG-UNIT per tablet Take 1 tablet by mouth every morning.    Yes [provider]  docusate sodium (COLACE) 100 MG capsule Take 100 mg by mouth at bedtime.    Yes [provider]  folic acid (FOLVITE) 1 MG tablet Take 1 mg by mouth every morning.    Yes [provider]  HYDROcodone-acetaminophen (NORCO/VICODIN) 5-325 MG tablet Take 1 tablet by mouth every 6 (six) hours as needed for severe pain. 05/06/18  Yes Collene Gobble, MD  HYDROcodone-homatropine [facility]) 5-1.5 MG/5ML syrup Take 5 mLs by mouth at bedtime and may repeat dose one time if needed. 04/30/18  Yes Martyn Ehrich, NP  levofloxacin (LEVAQUIN) 500 MG tablet Take 1 tablet (500 mg total) by mouth daily for 10 days. 05/09/18 05/19/18 Yes Phillips Grout, MD  Loratadine 5 MG/5ML SOLN Take 10 mLs by mouth at bedtime.   Yes [provider]  Multiple Vitamins-Minerals (ABC PLUS SENIOR) TABS Take 1 tablet by mouth every morning.    Yes [provider]  POLY-IRON 150 150 MG capsule Take 150mg  by mouth once daily 05/28/16  Yes [provider]  polyethylene glycol (MIRALAX / GLYCOLAX) packet Take 17 g by mouth daily as needed for mild constipation.    Yes [provider]  Potassium 99 MG TABS Take 99 mg by mouth daily.   Yes [provider]  Donnal Debar 100-62.5-25 MCG/INH AEPB USE 1 INHALATION DAILY 02/26/18  Yes Byrum, Rose Fillers, MD    valsartan-hydrochlorothiazide (DIOVAN-HCT) 160-12.5 MG per tablet Take 1 tablet by mouth every morning.  02/11/15  Yes [provider]  vitamin B-12 (CYANOCOBALAMIN) 500 MCG tablet Take 2,500 mcg by mouth every morning.    Yes [provider]    Family History Family History  Problem Relation Age of Onset  . Heart disease Father        Before age 57  . Hyperlipidemia Father   . Hypertension Father   . Pneumonia Mother   . Alzheimer's disease Mother   . Colon cancer Neg Hx     Social History Social History   Tobacco Use  . Smoking status: Former Smoker  Packs/day: 0.50    Years: 50.00    Pack years: 25.00    Types: Cigarettes    Last attempt to quit: 12/05/2000    Years since quitting: 17.4  . Smokeless tobacco: Never Used  . Tobacco comment: started smoking in early 20s  Substance Use Topics  . Alcohol use: No    Alcohol/week: 0.0 oz  . Drug use: No     Allergies   Prochlorperazine edisylate; Sulfamethoxazole; and Doxycycline   Review of Systems Review of Systems  Constitutional: Positive for fatigue. Negative for chills, diaphoresis and fever.  HENT: Negative for congestion, rhinorrhea and sneezing.   Eyes: Negative.   Respiratory: Positive for cough and shortness of breath. Negative for chest tightness.   Cardiovascular: Positive for chest pain. Negative for leg swelling.  Gastrointestinal: Negative for abdominal pain, blood in stool, diarrhea, nausea and vomiting.  Genitourinary: Negative for difficulty urinating, flank pain, frequency and hematuria.  Musculoskeletal: Positive for back pain. Negative for arthralgias.  Skin: Negative for rash.  Neurological: Negative for dizziness, speech difficulty, weakness, numbness and headaches.     Physical Exam Updated Vital Signs BP 129/63 (BP Location: Left Arm)   Pulse (!) 103   Temp 98.1 F (36.7 C) (Oral)   Resp 18   Ht 5\' 5"  (1.651 m)   Wt 68.9 kg (152 lb)   SpO2 99%   BMI 25.29 kg/m    Physical Exam  Constitutional: She is oriented to person, place, and time. She appears well-developed and well-nourished.  HENT:  Head: Normocephalic and atraumatic.  Eyes: Pupils are equal, round, and reactive to light.  Neck: Normal range of motion. Neck supple.  Cardiovascular: Normal rate, regular rhythm and normal heart sounds.  Pulmonary/Chest: Effort normal. Tachypnea noted. No respiratory distress. She has decreased breath sounds. She has no wheezes. She has no rales. She exhibits no tenderness.  Abdominal: Soft. Bowel sounds are normal. There is no tenderness. There is no rebound and no guarding.  Musculoskeletal: Normal range of motion. She exhibits no edema.  Lymphadenopathy:    She has no cervical adenopathy.  Neurological: She is alert and oriented to person, place, and time.  Skin: Skin is warm and dry. No rash noted.  Psychiatric: She has a normal mood and affect.     ED Treatments / Results  Labs (all labs ordered are listed, but only abnormal results are displayed) Labs Reviewed  BASIC METABOLIC PANEL - Abnormal; Notable for the following components:      Result Value   Glucose, Bld 109 (*)    All other components within normal limits  CBC WITH DIFFERENTIAL/PLATELET - Abnormal; Notable for the following components:   RBC 2.74 (*)    Hemoglobin 8.1 (*)    HCT 25.1 (*)    Neutro Abs 9.0 (*)    All other components within normal limits  BRAIN NATRIURETIC PEPTIDE  I-STAT TROPONIN, ED    EKG EKG Interpretation  Date/Time:  Tuesday May 12 2018 17:29:22 EDT Ventricular Rate:  110 PR Interval:    QRS Duration: 90 QT Interval:  346 QTC Calculation: 468 R Axis:   -107 Text Interpretation:  Sinus tachycardia Left anterior fascicular block Low voltage, precordial leads Consider anterior infarct Minimal ST depression, inferior leads Baseline wander in lead(s) I III aVL since last tracing no significant change Confirmed by Malvin Johns [phone]) on 05/12/2018  5:51:53 PM   Radiology Dg Chest 2 View  Result Date: 05/12/2018 CLINICAL DATA:  Shortness of breath. Diagnosed with  lung cancer 4 days ago. History of colon cancer. EXAM: CHEST - 2 VIEW COMPARISON:  Chest radiograph May 06, 2018 FINDINGS: Persistent RIGHT lung base consolidation. Increasing lobulated moderate RIGHT, small LEFT pleural effusions. Cardiac silhouette is upper limits of normal size. Calcified aortic arch. Increased lung volumes with mild apical bullous changes suspected. No pneumothorax. Soft tissue planes and included osseous structures are non suspicious. IMPRESSION: Increasing moderate RIGHT and small LEFT pleural effusions. Persistent consolidation RIGHT lower lobe. Aortic Atherosclerosis (ICD10-I70.0). Emphysema (ICD10-J43.9). Electronically Signed   By: Elon Alas M.D.   On: 05/12/2018 17:52   Ct Angio Chest Pe W/cm &/or Wo Cm  Result Date: 05/12/2018 CLINICAL DATA:  Short of breath.  History of lung cancer EXAM: CT ANGIOGRAPHY CHEST WITH CONTRAST TECHNIQUE: Multidetector CT imaging of the chest was performed using the standard protocol during bolus administration of intravenous contrast. Multiplanar CT image reconstructions and MIPs were obtained to evaluate the vascular anatomy. CONTRAST:  137mL ISOVUE-370 IOPAMIDOL (ISOVUE-370) INJECTION 76% COMPARISON:  Chest CT 04/22/2018 FINDINGS: Cardiovascular: Negative for pulmonary embolism. Pulmonary arteries normal in caliber. Atherosclerotic calcification aortic arch without aneurysm or dissection. Heart size within normal limits. Mediastinum/Nodes: Negative for mass or adenopathy Lungs/Pleura: Persistent right lower lobe collapse with small right effusion. No obstructing mass lesion. Recent biopsy positive for right lower low adenocarcinoma. Severe emphysema. Left lower lobe scarring. No effusion on the left. Upper Abdomen: Negative Musculoskeletal: Multilevel degenerative change. No acute skeletal abnormality. Review of the MIP  images confirms the above findings. IMPRESSION: Negative for pulmonary embolism Persistent right lower lobe collapse and small right effusion. Aortic Atherosclerosis (ICD10-I70.0) and Emphysema (ICD10-J43.9). Electronically Signed   By: Franchot Gallo M.D.   On: 05/12/2018 20:09    Procedures Procedures (including critical care time)  Medications Ordered in ED Medications  iopamidol (ISOVUE-370) 76 % injection (has no administration in time range)  methylPREDNISolone sodium succinate (SOLU-MEDROL) 125 mg/2 mL injection 125 mg (has no administration in time range)  HYDROcodone-homatropine (HYCODAN) 5-1.5 MG/5ML syrup 5 mL (has no administration in time range)  HYDROcodone-acetaminophen (NORCO/VICODIN) 5-325 MG per tablet 1 tablet (has no administration in time range)  albuterol (PROVENTIL HFA;VENTOLIN HFA) 108 (90 Base) MCG/ACT inhaler 2 puff (has no administration in time range)  atorvastatin (LIPITOR) tablet 10 mg (has no administration in time range)  Calcium Carbonate-Vitamin D 600-400 MG-UNIT 1 tablet (has no administration in time range)  docusate sodium (COLACE) capsule 100 mg (has no administration in time range)  folic acid (FOLVITE) tablet 1 mg (has no administration in time range)  acetaminophen (TYLENOL) CR tablet 1,300 mg (has no administration in time range)  valsartan-hydrochlorothiazide (DIOVAN-HCT) 160-12.5 MG per tablet 1 tablet (has no administration in time range)  Fluticasone-Umeclidin-Vilant 100-62.5-25 MCG/INH AEPB (has no administration in time range)  vitamin B-12 (CYANOCOBALAMIN) tablet 2,500 mcg (has no administration in time range)  iron polysaccharides (NIFEREX) capsule 150 mg (has no administration in time range)  polyethylene glycol (MIRALAX / GLYCOLAX) packet 17 g (has no administration in time range)  ABC PLUS SENIOR TABS 1 tablet (has no administration in time range)  Loratadine SOLN 10 mg (has no administration in time range)  albuterol (PROVENTIL) (2.5  MG/3ML) 0.083% nebulizer solution 5 mg (5 mg Nebulization Given 05/12/18 1838)  iopamidol (ISOVUE-370) 76 % injection 100 mL (100 mLs Intravenous Contrast Given 05/12/18 1941)     Initial Impression / Assessment and Plan / ED Course  I have reviewed the triage vital signs and the nursing notes.  Pertinent  labs & imaging results that were available during my care of the patient were reviewed by me and considered in my medical decision making (see chart for details).     Patient is a 77 year old female who has a recent diagnosis of adenocarcinoma the lung and has persistent consolidation in the right lower lung.  Since discharge a few days ago she has had increasing shortness of breath.  She has some new small bilateral pleural effusions.  She has a persistent right lung consolidation.  CT scan does not show any evidence of pulmonary embolus.  She is afebrile.  She is mildly tachycardic and tachypneic.  She is maintaining normal oxygen saturations but she does get really short of breath and tachypneic with minimal movements.  She was given albuterol neb with some improvement in symptoms and also Solu-Medrol.  I spoke with Dr. Alcario Drought with hospitalist service to admit the patient and restart IV antibiotics.  Final Clinical Impressions(s) / ED Diagnoses   Final diagnoses:  COPD exacerbation ([place])  HCAP (healthcare-associated pneumonia)    ED Discharge Orders    None       Malvin Johns, MD 05/12/18 2041

## 2018-05-12 NOTE — ED Triage Notes (Signed)
Pt BIB EMS from home c/o shortness of breath. Recent dx of lung cancer after recurrent tx of pna. Denies pain. Able to speak in full sentences. Pt on 3L/min nasal cannula at home. Pt sats 98% at 3L/min while triaging.

## 2018-05-12 NOTE — ED Notes (Signed)
ED TO INPATIENT HANDOFF REPORT  Name/Age/Gender Casey Wilkinson 77 y.o. female  Code Status    Code Status Orders  (From admission, onward)        Start     Ordered   05/12/18 2040  Full code  Continuous     05/12/18 2042    Code Status History    Date Active Date Inactive Code Status Order ID Comments User Context   05/06/2018 2357 05/09/2018 1545 Full Code [id number]  Norval Morton, MD ED   02/05/2016 1643 02/07/2016 1342 Full Code [id number]  Leandrew Koyanagi, MD Inpatient   06/23/2015 1716 06/29/2015 1317 Full Code [id number]  Excell Seltzer, MD Inpatient   02/06/2015 1558 02/12/2015 1940 Full Code [id number]  Nita Sells, MD ED    Advance Directive Documentation     Most Recent Value  Type of Advance Directive  Living will  Pre-existing out of facility DNR order (yellow form or pink MOST form)  -  "MOST" Form in Place?  -      Home/SNF/Other Home  Chief Complaint Shortness of breath  Level of Care/Admitting Diagnosis ED Disposition    ED Disposition Condition Superior: [facility] [100102]  Level of Care: Med-Surg [16]  Diagnosis: HCAP (healthcare-associated pneumonia) [765465]  Admitting Physician: Doreatha Massed  Attending Physician: Etta Quill [phone]  Estimated length of stay: past midnight tomorrow  Certification:: I certify this patient will need inpatient services for at least 2 midnights  PT Class (Do Not Modify): Inpatient [101]  PT Acc Code (Do Not Modify): Private [1]       Medical History Past Medical History:  Diagnosis Date  . Anemia   . Arthritis    "all over"  . Carotid artery stenosis    right side followed by [facility]- 65%  . Colitis April 2016  . Colon cancer ([place])   . COPD (chronic obstructive pulmonary disease) ([place])   . Discoid lupus erythematosus   . Diverticulitis April 2016   and Colitis  . History of blood transfusion 01/2015, 06/2015   colectomy  . Hypertension   .  Psoriatic arthritis ([place])   . Shortness of breath dyspnea    with exertion  . Squamous cell carcinoma, leg     Allergies Allergies  Allergen Reactions  . Prochlorperazine Edisylate Nausea Only and Other (See Comments)    **COMPAZINE**   Stroke-like symptoms  . Sulfamethoxazole Nausea Only and Other (See Comments)    Pt taking Methotrexate, Sulfur drugs could cause SEVERE fatal reaction.  . Doxycycline Hives    IV Location/Drains/Wounds Patient Lines/Drains/Airways Status   Active Line/Drains/Airways    Name:   Placement date:   Placement time:   Site:   Days:   Peripheral IV 05/12/18 Right Antecubital   05/12/18    1744    Antecubital   less than 1          Labs/Imaging Results for orders placed or performed during the hospital encounter of 05/12/18 (from the past 48 hour(s))  Basic metabolic panel     Status: Abnormal   Collection Time: 05/12/18  6:38 PM  Result Value Ref Range   Sodium 137 135 - 145 mmol/L   Potassium 4.2 3.5 - 5.1 mmol/L   Chloride 98 98 - 111 mmol/L   CO2 26 22 - 32 mmol/L   Glucose, Bld 109 (H) 70 - 99 mg/dL   BUN 15 8 - 23 mg/dL  Creatinine, Ser 0.70 0.44 - 1.00 mg/dL   Calcium 9.4 8.9 - 10.3 mg/dL   GFR calc non Af Amer >60 >60 mL/min   GFR calc Af Amer >60 >60 mL/min    Comment: (NOTE) The eGFR has been calculated using the CKD EPI equation. This calculation has not been validated in all clinical situations. eGFR's persistently <60 mL/min signify possible Chronic Kidney Disease.    Anion gap 13 5 - 15    Comment: Performed at [facility], [place] [address] [place], [place] [address]  CBC with Differential     Status: Abnormal   Collection Time: 05/12/18  6:38 PM  Result Value Ref Range   WBC 10.4 4.0 - 10.5 K/uL   RBC 2.74 (L) 3.87 - 5.11 MIL/uL   Hemoglobin 8.1 (L) 12.0 - 15.0 g/dL   HCT 25.1 (L) 36.0 - 46.0 %   MCV 91.6 78.0 - 100.0 fL   MCH 29.6 26.0 - 34.0 pg   MCHC 32.3 30.0 - 36.0 g/dL   RDW 14.6 11.5 - 15.5  %   Platelets 258 150 - 400 K/uL   Neutrophils Relative % 87 %   Neutro Abs 9.0 (H) 1.7 - 7.7 K/uL   Lymphocytes Relative 9 %   Lymphs Abs 0.9 0.7 - 4.0 K/uL   Monocytes Relative 4 %   Monocytes Absolute 0.4 0.1 - 1.0 K/uL   Eosinophils Relative 0 %   Eosinophils Absolute 0.0 0.0 - 0.7 K/uL   Basophils Relative 0 %   Basophils Absolute 0.0 0.0 - 0.1 K/uL    Comment: Performed at [facility], [place] [address], [place], [place] [address]  Brain natriuretic peptide     Status: None   Collection Time: 05/12/18  6:38 PM  Result Value Ref Range   B Natriuretic Peptide 49.7 0.0 - 100.0 pg/mL    Comment: Performed at [facility], [place] [address], [place], [place] [address]  I-stat troponin, ED     Status: None   Collection Time: 05/12/18  6:41 PM  Result Value Ref Range   Troponin i, poc 0.05 0.00 - 0.08 ng/mL   Comment 3            Comment: Due to the release kinetics of cTnI, a negative result within the first hours of the onset of symptoms does not rule out myocardial infarction with certainty. If myocardial infarction is still suspected, repeat the test at appropriate intervals.    Dg Chest 2 View  Result Date: 05/12/2018 CLINICAL DATA:  Shortness of breath. Diagnosed with lung cancer 4 days ago. History of colon cancer. EXAM: CHEST - 2 VIEW COMPARISON:  Chest radiograph May 06, 2018 FINDINGS: Persistent RIGHT lung base consolidation. Increasing lobulated moderate RIGHT, small LEFT pleural effusions. Cardiac silhouette is upper limits of normal size. Calcified aortic arch. Increased lung volumes with mild apical bullous changes suspected. No pneumothorax. Soft tissue planes and included osseous structures are non suspicious. IMPRESSION: Increasing moderate RIGHT and small LEFT pleural effusions. Persistent consolidation RIGHT lower lobe. Aortic Atherosclerosis (ICD10-I70.0). Emphysema (ICD10-J43.9). Electronically Signed   By: Elon Alas M.D.    On: 05/12/2018 17:52   Ct Angio Chest Pe W/cm &/or Wo Cm  Result Date: 05/12/2018 CLINICAL DATA:  Short of breath.  History of lung cancer EXAM: CT ANGIOGRAPHY CHEST WITH CONTRAST TECHNIQUE: Multidetector CT imaging of the chest was performed using the standard protocol during bolus administration of intravenous contrast. Multiplanar CT image reconstructions and  MIPs were obtained to evaluate the vascular anatomy. CONTRAST:  117m ISOVUE-370 IOPAMIDOL (ISOVUE-370) INJECTION 76% COMPARISON:  Chest CT 04/22/2018 FINDINGS: Cardiovascular: Negative for pulmonary embolism. Pulmonary arteries normal in caliber. Atherosclerotic calcification aortic arch without aneurysm or dissection. Heart size within normal limits. Mediastinum/Nodes: Negative for mass or adenopathy Lungs/Pleura: Persistent right lower lobe collapse with small right effusion. No obstructing mass lesion. Recent biopsy positive for right lower low adenocarcinoma. Severe emphysema. Left lower lobe scarring. No effusion on the left. Upper Abdomen: Negative Musculoskeletal: Multilevel degenerative change. No acute skeletal abnormality. Review of the MIP images confirms the above findings. IMPRESSION: Negative for pulmonary embolism Persistent right lower lobe collapse and small right effusion. Aortic Atherosclerosis (ICD10-I70.0) and Emphysema (ICD10-J43.9). Electronically Signed   By: CFranchot GalloM.D.   On: 05/12/2018 20:09    Pending Labs Unresulted Labs (From admission, onward)   Start     Ordered   05/12/18 2040  Culture, blood (routine x 2) Call MD if unable to obtain prior to antibiotics being given  BLOOD CULTURE X 2,   R    Comments:  If blood cultures drawn in Emergency Department - Do not draw and cancel order    05/12/18 2042   05/12/18 2040  Culture, sputum-assessment  Once,   R     05/12/18 2042   05/12/18 2040  Gram stain  Once,   R     05/12/18 2042   05/12/18 2040  HIV antibody (Routine Screening)  Once,   R     05/12/18  2042   05/12/18 2040  Strep pneumoniae urinary antigen  Once,   R     05/12/18 2042      Vitals/Pain Today's Vitals   05/12/18 1720 05/12/18 1722 05/12/18 1840  BP: 129/63    Pulse: (!) 103    Resp: 18    Temp: 98.1 F (36.7 C)    TempSrc: Oral    SpO2: 96%  99%  Weight:  152 lb (68.9 kg)   Height:  _0  (1.651 m)   PainSc:  0-No pain     Isolation Precautions No active isolations  Medications Medications  iopamidol (ISOVUE-370) 76 % injection (has no administration in time range)  HYDROcodone-homatropine (HYCODAN) 5-1.5 MG/5ML syrup 5 mL (has no administration in time range)  HYDROcodone-acetaminophen (NORCO/VICODIN) 5-325 MG per tablet 1 tablet (has no administration in time range)  albuterol (PROVENTIL HFA;VENTOLIN HFA) 108 (90 Base) MCG/ACT inhaler 2 puff (has no administration in time range)  atorvastatin (LIPITOR) tablet 10 mg (has no administration in time range)  Calcium Carbonate-Vitamin D 600-400 MG-UNIT 1 tablet (has no administration in time range)  docusate sodium (COLACE) capsule 100 mg (has no administration in time range)  folic acid (FOLVITE) tablet 1 mg (has no administration in time range)  acetaminophen (TYLENOL) CR tablet 1,300 mg (has no administration in time range)  valsartan-hydrochlorothiazide (DIOVAN-HCT) 160-12.5 MG per tablet 1 tablet (has no administration in time range)  Fluticasone-Umeclidin-Vilant 100-62.5-25 MCG/INH AEPB (has no administration in time range)  vitamin B-12 (CYANOCOBALAMIN) tablet 2,500 mcg (has no administration in time range)  iron polysaccharides (NIFEREX) capsule 150 mg (has no administration in time range)  polyethylene glycol (MIRALAX / GLYCOLAX) packet 17 g (has no administration in time range)  ABC PLUS SENIOR TABS 1 tablet (has no administration in time range)  Loratadine SOLN 10 mg (has no administration in time range)  enoxaparin (LOVENOX) injection 40 mg (has no administration in time range)  albuterol  (PROVENTIL) (  2.5 MG/3ML) 0.083% nebulizer solution 5 mg (5 mg Nebulization Given 05/12/18 1838)  iopamidol (ISOVUE-370) 76 % injection 100 mL (100 mLs Intravenous Contrast Given 05/12/18 1941)  methylPREDNISolone sodium succinate (SOLU-MEDROL) 125 mg/2 mL injection 125 mg (125 mg Intravenous Given 05/12/18 2044)    Mobility Walks with assistance and O2

## 2018-05-12 NOTE — ED Notes (Signed)
Bed: OM35 Expected date:  Expected time:  Means of arrival:  Comments: EMS-SOB

## 2018-05-12 NOTE — H&P (Addendum)
History and Physical    Casey Wilkinson TDV:[id number] DOB: [birth year] DOA: 05/12/2018  PCP: Shon Baton, MD  Patient coming from: Home  I have personally briefly reviewed patient's old medical records in [place]  Chief Complaint: SOB  HPI: Casey Wilkinson is a 77 y.o. female with medical history significant of COPD 2L home O2 at baseline, HTN, discoid lupus.  Patient recently admitted to our service from 7/17-7/20 with persistent RLL infiltrate, sepsis.  Bronch grew no organisms but they did find adenocarcinoma during bronch.  She was treated with cefepime and vanc while in hospital, seemed to be doing better and was discharged on 7/20.  Since discharge she has been on levaquin, but has had worsening SOB, DOE, despite this.  Returns to ED today for worsening symptoms.   ED Course: CT shows persistent RLL collapse / infiltrate, small R pleural effusion.  Got dose of solumedrol in ED.   Review of Systems: As per HPI otherwise 10 point review of systems negative.   Past Medical History:  Diagnosis Date  . Anemia   . Arthritis    "all over"  . Carotid artery stenosis    right side followed by [facility]- 65%  . Colitis April 2016  . Colon cancer ([place])   . COPD (chronic obstructive pulmonary disease) ([place])   . Discoid lupus erythematosus   . Diverticulitis April 2016   and Colitis  . History of blood transfusion 01/2015, 06/2015   colectomy  . Hypertension   . Psoriatic arthritis ([place])   . Shortness of breath dyspnea    with exertion  . Squamous cell carcinoma, leg     Past Surgical History:  Procedure Laterality Date  . APPENDECTOMY  06/2015  . CATARACT EXTRACTION W/ INTRAOCULAR LENS  IMPLANT, BILATERAL Bilateral 2017  . COLONOSCOPY  06/23/15  . COLONOSCOPY W/ POLYPECTOMY    . CYSTOCELE REPAIR  2006  . DILATION AND CURETTAGE OF UTERUS  X 2  . EXCISIONAL HEMORRHOIDECTOMY  2006  . JOINT REPLACEMENT    . LAPAROSCOPIC PARTIAL COLECTOMY N/A 06/23/2015   Procedure:  LAPAROSCOPIC ILEOCECETOMY;  Surgeon: Excell Seltzer, MD;  Location: [facility];  Service: General;  Laterality: N/A;  . MOHS SURGERY Left    "basal; LLE"  . RECTOCELE REPAIR  2006  . SQUAMOUS CELL CARCINOMA EXCISION  "several"   "legs mostly"  . TOTAL KNEE ARTHROPLASTY Left 01/31/2016   Procedure: LEFT TOTAL KNEE ARTHROPLASTY;  Surgeon: Leandrew Koyanagi, MD;  Location: [facility];  Service: Orthopedics;  Laterality: Left;  Marland Kitchen VAGINAL HYSTERECTOMY  1968   "total"  . VIDEO BRONCHOSCOPY Bilateral 05/06/2018   Procedure: VIDEO BRONCHOSCOPY WITH FLUORO;  Surgeon: Collene Gobble, MD;  Location: [facility] ENDOSCOPY;  Service: Cardiopulmonary;  Laterality: Bilateral;     reports that she quit smoking about 17 years ago. Her smoking use included cigarettes. She has a 25.00 pack-year smoking history. She has never used smokeless tobacco. She reports that she does not drink alcohol or use drugs.  Allergies  Allergen Reactions  . Prochlorperazine Edisylate Nausea Only and Other (See Comments)    **COMPAZINE**   Stroke-like symptoms  . Sulfamethoxazole Nausea Only and Other (See Comments)    Pt taking Methotrexate, Sulfur drugs could cause SEVERE fatal reaction.  . Doxycycline Hives    Family History  Problem Relation Age of Onset  . Heart disease Father        Before age 22  . Hyperlipidemia Father   . Hypertension Father   .  Pneumonia Mother   . Alzheimer's disease Mother   . Colon cancer Neg Hx      Prior to Admission medications   Medication Sig Start Date End Date Taking? Authorizing Provider  acetaminophen (TYLENOL) 650 MG CR tablet Take 1,300 mg by mouth every 8 (eight) hours as needed for pain.    Yes [provider]  albuterol (PROAIR HFA) 108 (90 Base) MCG/ACT inhaler Inhale 2 puffs into the lungs every 4 (four) hours as needed for wheezing or shortness of breath. 03/31/18  Yes Collene Gobble, MD  atorvastatin (LIPITOR) 10 MG tablet Take 10 mg by mouth every morning.  02/14/14  Yes  [provider]  Calcium Carbonate-Vitamin D 600-400 MG-UNIT per tablet Take 1 tablet by mouth every morning.    Yes [provider]  docusate sodium (COLACE) 100 MG capsule Take 100 mg by mouth at bedtime.    Yes [provider]  folic acid (FOLVITE) 1 MG tablet Take 1 mg by mouth every morning.    Yes [provider]  HYDROcodone-acetaminophen (NORCO/VICODIN) 5-325 MG tablet Take 1 tablet by mouth every 6 (six) hours as needed for severe pain. 05/06/18  Yes Collene Gobble, MD  HYDROcodone-homatropine [facility]) 5-1.5 MG/5ML syrup Take 5 mLs by mouth at bedtime and may repeat dose one time if needed. 04/30/18  Yes Martyn Ehrich, NP  levofloxacin (LEVAQUIN) 500 MG tablet Take 1 tablet (500 mg total) by mouth daily for 10 days. 05/09/18 05/19/18 Yes Phillips Grout, MD  Loratadine 5 MG/5ML SOLN Take 10 mLs by mouth at bedtime.   Yes [provider]  Multiple Vitamins-Minerals (ABC PLUS SENIOR) TABS Take 1 tablet by mouth every morning.    Yes [provider]  POLY-IRON 150 150 MG capsule Take 150mg  by mouth once daily 05/28/16  Yes [provider]  polyethylene glycol (MIRALAX / GLYCOLAX) packet Take 17 g by mouth daily as needed for mild constipation.    Yes [provider]  Potassium 99 MG TABS Take 99 mg by mouth daily.   Yes [provider]  Donnal Debar 100-62.5-25 MCG/INH AEPB USE 1 INHALATION DAILY 02/26/18  Yes Byrum, Rose Fillers, MD  valsartan-hydrochlorothiazide (DIOVAN-HCT) 160-12.5 MG per tablet Take 1 tablet by mouth every morning.  02/11/15  Yes [provider]  vitamin B-12 (CYANOCOBALAMIN) 500 MCG tablet Take 2,500 mcg by mouth every morning.    Yes [provider]    Physical Exam: Vitals:   05/12/18 1720 05/12/18 1722 05/12/18 1840  BP: 129/63    Pulse: (!) 103    Resp: 18    Temp: 98.1 F (36.7 C)    TempSrc: Oral    SpO2: 96%  99%  Weight:  68.9 kg (152 lb)   Height:  5\' 5"   (1.651 m)     Constitutional: NAD, calm, comfortable Eyes: PERRL, lids and conjunctivae normal ENMT: Mucous membranes are moist. Posterior pharynx clear of any exudate or lesions.Normal dentition.  Neck: normal, supple, no masses, no thyromegaly Respiratory: Diminished especially at R base, no wheezing, hemoptysis Cardiovascular: Regular rate and rhythm, no murmurs / rubs / gallops. No extremity edema. 2+ pedal pulses. No carotid bruits.  Abdomen: no tenderness, no masses palpated. No hepatosplenomegaly. Bowel sounds positive.  Musculoskeletal: no clubbing / cyanosis. No joint deformity upper and lower extremities. Good ROM, no contractures. Normal muscle tone.  Skin: no rashes, lesions, ulcers. No induration Neurologic: CN 2-12 grossly intact. Sensation intact, DTR normal. Strength 5/5 in  all 4.  Psychiatric: Normal judgment and insight. Alert and oriented x 3. Normal mood.    Labs on Admission: I have personally reviewed following labs and imaging studies  CBC: Recent Labs  Lab 05/06/18 2155 05/07/18 0524 05/07/18 1516 05/08/18 0433 05/12/18 1838  WBC 16.0* 10.6*  --  7.3 10.4  NEUTROABS 14.4*  --   --   --  9.0*  HGB 9.6* 7.7* 8.1* 7.6* 8.1*  HCT 31.2* 24.7* 26.3* 24.4* 25.1*  MCV 95.4 95.4  --  94.6 91.6  PLT 269 169  --  183 683   Basic Metabolic Panel: Recent Labs  Lab 05/06/18 2155 05/07/18 0524 05/08/18 0433 05/12/18 1838  NA 136 141 139 137  K 4.1 3.7 3.4* 4.2  CL 102 111 108 98  CO2 24 24 24 26   GLUCOSE 146* 125* 83 109*  BUN 24* 19 10 15   CREATININE 0.99 0.85 0.62 0.70  CALCIUM 9.4 8.0* 8.4* 9.4   GFR: Estimated Creatinine Clearance: 56.5 mL/min (by C-G formula based on SCr of 0.7 mg/dL). Liver Function Tests: Recent Labs  Lab 05/06/18 2155  AST 25  ALT 17  ALKPHOS 115  BILITOT 1.2  PROT 7.9  ALBUMIN 3.1*   No results for input(s): LIPASE, AMYLASE in the last 168 hours. No results for input(s): AMMONIA in the last 168 hours. Coagulation  Profile: Recent Labs  Lab 05/06/18 2155  INR 1.11   Cardiac Enzymes: No results for input(s): CKTOTAL, CKMB, CKMBINDEX, TROPONINI in the last 168 hours. BNP (last 3 results) No results for input(s): PROBNP in the last 8760 hours. HbA1C: No results for input(s): HGBA1C in the last 72 hours. CBG: No results for input(s): GLUCAP in the last 168 hours. Lipid Profile: No results for input(s): CHOL, HDL, LDLCALC, TRIG, CHOLHDL, LDLDIRECT in the last 72 hours. Thyroid Function Tests: No results for input(s): TSH, T4TOTAL, FREET4, T3FREE, THYROIDAB in the last 72 hours. Anemia Panel: No results for input(s): VITAMINB12, FOLATE, FERRITIN, TIBC, IRON, RETICCTPCT in the last 72 hours. Urine analysis:    Component Value Date/Time   COLORURINE YELLOW 05/07/2018 0521   APPEARANCEUR CLEAR 05/07/2018 0521   LABSPEC 1.010 05/07/2018 0521   PHURINE 5.0 05/07/2018 0521   GLUCOSEU NEGATIVE 05/07/2018 0521   HGBUR NEGATIVE 05/07/2018 0521   BILIRUBINUR NEGATIVE 05/07/2018 0521   KETONESUR 5 (A) 05/07/2018 0521   PROTEINUR NEGATIVE 05/07/2018 0521   NITRITE NEGATIVE 05/07/2018 0521   LEUKOCYTESUR NEGATIVE 05/07/2018 0521    Radiological Exams on Admission: Dg Chest 2 View  Result Date: 05/12/2018 CLINICAL DATA:  Shortness of breath. Diagnosed with lung cancer 4 days ago. History of colon cancer. EXAM: CHEST - 2 VIEW COMPARISON:  Chest radiograph May 06, 2018 FINDINGS: Persistent RIGHT lung base consolidation. Increasing lobulated moderate RIGHT, small LEFT pleural effusions. Cardiac silhouette is upper limits of normal size. Calcified aortic arch. Increased lung volumes with mild apical bullous changes suspected. No pneumothorax. Soft tissue planes and included osseous structures are non suspicious. IMPRESSION: Increasing moderate RIGHT and small LEFT pleural effusions. Persistent consolidation RIGHT lower lobe. Aortic Atherosclerosis (ICD10-I70.0). Emphysema (ICD10-J43.9). Electronically Signed    By: Elon Alas M.D.   On: 05/12/2018 17:52   Ct Angio Chest Pe W/cm &/or Wo Cm  Result Date: 05/12/2018 CLINICAL DATA:  Short of breath.  History of lung cancer EXAM: CT ANGIOGRAPHY CHEST WITH CONTRAST TECHNIQUE: Multidetector CT imaging of the chest was performed using the standard protocol during bolus administration of intravenous contrast. Multiplanar CT image  reconstructions and MIPs were obtained to evaluate the vascular anatomy. CONTRAST:  160mL ISOVUE-370 IOPAMIDOL (ISOVUE-370) INJECTION 76% COMPARISON:  Chest CT 04/22/2018 FINDINGS: Cardiovascular: Negative for pulmonary embolism. Pulmonary arteries normal in caliber. Atherosclerotic calcification aortic arch without aneurysm or dissection. Heart size within normal limits. Mediastinum/Nodes: Negative for mass or adenopathy Lungs/Pleura: Persistent right lower lobe collapse with small right effusion. No obstructing mass lesion. Recent biopsy positive for right lower low adenocarcinoma. Severe emphysema. Left lower lobe scarring. No effusion on the left. Upper Abdomen: Negative Musculoskeletal: Multilevel degenerative change. No acute skeletal abnormality. Review of the MIP images confirms the above findings. IMPRESSION: Negative for pulmonary embolism Persistent right lower lobe collapse and small right effusion. Aortic Atherosclerosis (ICD10-I70.0) and Emphysema (ICD10-J43.9). Electronically Signed   By: Franchot Gallo M.D.   On: 05/12/2018 20:09    EKG: Independently reviewed.  Assessment/Plan Principal Problem:   Acute on chronic respiratory failure with hypoxia (HCC) Active Problems:   COPD (chronic obstructive pulmonary disease) (HCC)   HTN (hypertension)   Adenocarcinoma of right lung ([place])   HCAP (healthcare-associated pneumonia)    1. Acute on chronic respiratory failure with hypoxia - due to HCAP / adenocarcinoma of RLL. 1. Since this seemed to be improving during last admit while on the cefepime and vanc, will put patient  back on cefepime and vanc to see if this helps again. 2. Call pulm in AM 3. PNA pathway 4. Repeat sputum and BCx 2. HTN - continue home BP meds 3. COPD - 1. continue home nebs 2. Adult wheeze protocol 3. Not really wheezing at this point, got single dose in ED but will hold off on continuing steroids for the moment, re-evaluate this in AM. 4. Adenocarcinoma of R lung - see above.  DVT prophylaxis: SCDs for now, having hemoptysis Code Status: Full Family Communication: Family at bedside Disposition Plan: Home after admit Consults called: None Admission status: Admit to inpatient - failed outpatient levaquin, worsening O2 requirement   Etta Quill DO [facility] Pager [phone] Only works nights!  If 7AM-7PM, please contact the primary day team physician taking care of patient  [email or web address] Password [facility]  05/12/2018, 8:42 PM

## 2018-05-12 NOTE — Progress Notes (Signed)
Pharmacy Antibiotic Note  Casey Wilkinson is a 77 y.o. female admitted on 05/12/2018 with pneumonia. Patient recently in hospital 05/06/2018-05/09/2018 for persistent RLL infiltrate, treated with Vancomycin and Cefepime, then discharged home on Levofloxacin to complete course. Patient having worsening SOB and DOE, so presented to [facility] ED. Pharmacy has been consulted for Vancomycin and Cefepime dosing on admission.  Plan:  Vancomycin 1500mg  IV x 1, then 1g IV q24h.  Plan for Vancomycin levels at steady state, as indicated.  Cefepime 1g IV q12h.   Monitor renal function, cultures, clinical course.   Height: 5\' 5"  (165.1 cm) Weight: 152 lb (68.9 kg) IBW/kg (Calculated) : 57  Temp (24hrs), Avg:98.1 F (36.7 C), Min:98.1 F (36.7 C), Max:98.1 F (36.7 C)  Recent Labs  Lab 05/06/18 2155 05/06/18 2204 05/07/18 0524 05/08/18 0433 05/12/18 1838  WBC 16.0*  --  10.6* 7.3 10.4  CREATININE 0.99  --  0.85 0.62 0.70  LATICACIDVEN  --  1.41  --   --   --     Estimated Creatinine Clearance: 56.5 mL/min (by C-G formula based on SCr of 0.7 mg/dL).    Allergies  Allergen Reactions  . Prochlorperazine Edisylate Nausea Only and Other (See Comments)    **COMPAZINE**   Stroke-like symptoms  . Sulfamethoxazole Nausea Only and Other (See Comments)    Pt taking Methotrexate, Sulfur drugs could cause SEVERE fatal reaction.  . Doxycycline Hives    Antimicrobials this admission: PTA Levofloxacin  7/23 Vancomycin >> 7/23 Cefepime >>  Dose adjustments this admission: --  Microbiology results: 7/23 BCx: ordered 7/23 Sputum: ordered    Thank you for allowing pharmacy to be a part of this patient's care.  Lindell Spar, PharmD, BCPS Pager: [phone] 05/12/2018 9:33 PM

## 2018-05-13 ENCOUNTER — Ambulatory Visit: Payer: Medicare Other | Admitting: Emergency Medicine

## 2018-05-13 LAB — EXPECTORATED SPUTUM ASSESSMENT W GRAM STAIN, RFLX TO RESP C

## 2018-05-13 LAB — EXPECTORATED SPUTUM ASSESSMENT W REFEX TO RESP CULTURE

## 2018-05-13 LAB — HIV ANTIBODY (ROUTINE TESTING W REFLEX): HIV Screen 4th Generation wRfx: NONREACTIVE

## 2018-05-13 MED ORDER — BUDESONIDE 0.5 MG/2ML IN SUSP
0.5000 mg | Freq: Two times a day (BID) | RESPIRATORY_TRACT | Status: DC
  Administered 2018-05-13 – 2018-05-23 (×21): 0.5 mg via RESPIRATORY_TRACT
  Filled 2018-05-13 (×20): qty 2

## 2018-05-13 MED ORDER — METHYLPREDNISOLONE SODIUM SUCC 125 MG IJ SOLR
60.0000 mg | Freq: Four times a day (QID) | INTRAMUSCULAR | Status: DC
  Administered 2018-05-13 – 2018-05-14 (×5): 60 mg via INTRAVENOUS
  Filled 2018-05-13 (×5): qty 2

## 2018-05-13 MED ORDER — IPRATROPIUM-ALBUTEROL 0.5-2.5 (3) MG/3ML IN SOLN
3.0000 mL | Freq: Three times a day (TID) | RESPIRATORY_TRACT | Status: DC
  Administered 2018-05-13 – 2018-05-16 (×8): 3 mL via RESPIRATORY_TRACT
  Filled 2018-05-13 (×8): qty 3

## 2018-05-13 MED ORDER — ARFORMOTEROL TARTRATE 15 MCG/2ML IN NEBU
15.0000 ug | INHALATION_SOLUTION | Freq: Two times a day (BID) | RESPIRATORY_TRACT | Status: DC
  Administered 2018-05-13 – 2018-05-22 (×19): 15 ug via RESPIRATORY_TRACT
  Filled 2018-05-13 (×21): qty 2

## 2018-05-13 NOTE — Progress Notes (Signed)
PROGRESS NOTE [facility]   Casey Wilkinson   RDE:[id number] DOB: 1941/03/04  DOA: 05/12/2018 PCP: Shon Baton, MD   Brief Narrative:  Casey Wilkinson is a 77 year old female with medical history significant for COPD on 2 L oxygen at home, hypertension, discoid lupus and recently diagnosed with adenocarcinoma right lung.  Patient was recently discharged on 05/09/2018 after being admitted for pneumonia, underwent bronchoscopy and found to have adenocarcinoma of the right lung.  Patient was discharged on antibiotic therapy, however over the past 3 days she has become more short of breath especially with exertion and was dropping her saturations and decided to come to the emergency room.  Upon ED evaluation CT of the chest showed persistent right lower lobe collapse/infiltrate and small right pleural effusion patient was started on empiric antibiotic and received steroids.  She is admitted with working diagnosis of post obstructive pneumonia.  Subjective: Patient seen and examined she reported feeling much better this morning after breathing treatments.  Denies chest pain, shortness of breath is getting better however does not feel at baseline.  No other complaints at this time. Nose bleed improved.   Assessment & Plan: Acute on chronic respiratory failure with hypoxia due to postobstructive pneumonia/adenocarcinoma of the right lower lobe. Patient has been started on cefepime and  vancomycin for postobstructive/H CAP pneumonia we will continue this for now.  Given improvement after steroid will start on Solu-Medrol 60 mg every 6 hours and taper down as able.  We will continue nebulizer treatment with Brovana and Pulmicort and add incentive spirometry.  Continue albuterol as needed.  Case discussed with pulmonology no need for further interventions and recommended to continue to treat with antibiotic therapy for now.  COPD See above  Hypertension BP stable Continue home  meds  Adenocarcinoma of right lung Patient has not see oncology yet, will need outpatient follow up after recover from this setback. No need to consult Oncology at this time.   DVT prophylaxis: SCD's, no lovenox as patient was c/o hemoptysis upon admission  Code Status: Full Code Family Communication: Family at bedside  Disposition Plan: Home in 2-3 days if respiratory status improves   Consultants:   None  Procedures:   None  Antimicrobials: Anti-infectives (From admission, onward)   Start     Dose/Rate Route Frequency Ordered Stop   05/13/18 2200  vancomycin (VANCOCIN) IVPB 1000 mg/200 mL premix     1,000 mg 200 mL/hr over 60 Minutes Intravenous Every 24 hours 05/12/18 2131     05/13/18 1000  levofloxacin (LEVAQUIN) tablet 500 mg  Status:  Discontinued     500 mg Oral Daily 05/12/18 2037 05/12/18 2039   05/12/18 2200  ceFEPIme (MAXIPIME) 1 g in sodium chloride 0.9 % 100 mL IVPB     1 g 200 mL/hr over 30 Minutes Intravenous Every 12 hours 05/12/18 2126     05/12/18 2200  vancomycin (VANCOCIN) 1,500 mg in sodium chloride 0.9 % 500 mL IVPB     1,500 mg 250 mL/hr over 120 Minutes Intravenous  Once 05/12/18 2130 05/13/18 0204       Objective: Vitals:   05/13/18 1216 05/13/18 1217 05/13/18 1242 05/13/18 1345  BP:    (!) 143/69  Pulse:    (!) 115  Resp:    20  Temp:    98.2 F (36.8 C)  TempSrc:    Oral  SpO2: 94% 92% 93% 95%  Weight:      Height:  Intake/Output Summary (Last 24 hours) at 05/13/2018 1408 Last data filed at 05/13/2018 1026 Gross per 24 hour  Intake 960 ml  Output -  Net 960 ml   Filed Weights   05/12/18 1722  Weight: 68.9 kg (152 lb)    Examination:  General exam: Appears calm and comfortable  HEENT: OP moist and clear Respiratory system: Decreased breath sounds bilaterally, diffuse rhonchi more prominent on the right lower lobe, mild expiratory wheezing on left upper lobe.  NO use of accessory muscles Cardiovascular system: S1 & S2  heard, RRR. No JVD, murmurs, rubs or gallops Gastrointestinal system: Abdomen is nondistended, soft and nontender.  Central nervous system: Alert and oriented. No focal neurological deficits. Extremities: No pedal edema.  Skin: No rashes, lesions or ulcers Psychiatry: Mood & affect appropriate.    Data Reviewed: I have personally reviewed following labs and imaging studies  CBC: Recent Labs  Lab 05/06/18 2155 05/07/18 0524 05/07/18 1516 05/08/18 0433 05/12/18 1838  WBC 16.0* 10.6*  --  7.3 10.4  NEUTROABS 14.4*  --   --   --  9.0*  HGB 9.6* 7.7* 8.1* 7.6* 8.1*  HCT 31.2* 24.7* 26.3* 24.4* 25.1*  MCV 95.4 95.4  --  94.6 91.6  PLT 269 169  --  183 768   Basic Metabolic Panel: Recent Labs  Lab 05/06/18 2155 05/07/18 0524 05/08/18 0433 05/12/18 1838  NA 136 141 139 137  K 4.1 3.7 3.4* 4.2  CL 102 111 108 98  CO2 _0 GLUCOSE 146* 125* 83 109*  BUN 24* _1 CREATININE 0.99 0.85 0.62 0.70  CALCIUM 9.4 8.0* 8.4* 9.4   GFR: Estimated Creatinine Clearance: 56.5 mL/min (by C-G formula based on SCr of 0.7 mg/dL). Liver Function Tests: Recent Labs  Lab 05/06/18 2155  AST 25  ALT 17  ALKPHOS 115  BILITOT 1.2  PROT 7.9  ALBUMIN 3.1*   No results for input(s): LIPASE, AMYLASE in the last 168 hours. No results for input(s): AMMONIA in the last 168 hours. Coagulation Profile: Recent Labs  Lab 05/06/18 2155  INR 1.11   Cardiac Enzymes: No results for input(s): CKTOTAL, CKMB, CKMBINDEX, TROPONINI in the last 168 hours. BNP (last 3 results) No results for input(s): PROBNP in the last 8760 hours. HbA1C: No results for input(s): HGBA1C in the last 72 hours. CBG: No results for input(s): GLUCAP in the last 168 hours. Lipid Profile: No results for input(s): CHOL, HDL, LDLCALC, TRIG, CHOLHDL, LDLDIRECT in the last 72 hours. Thyroid Function Tests: No results for input(s): TSH, T4TOTAL, FREET4, T3FREE, THYROIDAB in the last 72 hours. Anemia Panel: No  results for input(s): VITAMINB12, FOLATE, FERRITIN, TIBC, IRON, RETICCTPCT in the last 72 hours. Sepsis Labs: Recent Labs  Lab 05/06/18 2155 05/06/18 2204  PROCALCITON 0.18  --   LATICACIDVEN  --  1.41    Recent Results (from the past 240 hour(s))  Culture, bal-quantitative     Status: Abnormal   Collection Time: 05/06/18  3:08 PM  Result Value Ref Range Status   Specimen Description BRONCHIAL ALVEOLAR LAVAGE  Final   Special Requests NONE  Final   Gram Stain   Final    ABUNDANT WBC PRESENT,BOTH PMN AND MONONUCLEAR RARE GRAM POSITIVE COCCI    Culture (A)  Final    >=100,000 COLONIES/mL Consistent with normal respiratory flora. Performed at [facility], [place] [address], [place], [place] [address]    Report Status 05/09/2018 FINAL  Final  Fungus Culture With Stain     Status: None (Preliminary result)   Collection Time: 05/06/18  3:08 PM  Result Value Ref Range Status   Fungus Stain Final report  Final    Comment: (NOTE) Performed At: [facility] [place], [place] [id number] Rush Farmer MD EZ:[id number]    Fungus (Mycology) Culture PENDING  Incomplete   Fungal Source BRONCHIAL ALVEOLAR LAVAGE  Final    Comment: RLL  Acid Fast Smear (AFB)     Status: None   Collection Time: 05/06/18  3:08 PM  Result Value Ref Range Status   AFB Specimen Processing Concentration  Final   Acid Fast Smear Negative  Final    Comment: (NOTE) Performed At: [facility] [place], [place] [id number] Rush Farmer MD [id number]    Source (AFB) BRONCHIAL ALVEOLAR LAVAGE  Final    Comment: RLL  Fungus Culture Result     Status: None   Collection Time: 05/06/18  3:08 PM  Result Value Ref Range Status   Result 1 Comment  Final    Comment: (NOTE) KOH/Calcofluor preparation:  no fungus observed. Performed At: [facility] [place], [place] [id number] Rush Farmer MD [id number]   Culture, blood (Routine x 2)      Status: None   Collection Time: 05/06/18  9:57 PM  Result Value Ref Range Status   Specimen Description BLOOD RIGHT ANTECUBITAL  Final   Special Requests   Final    BOTTLES DRAWN AEROBIC AND ANAEROBIC Blood Culture results may not be optimal due to an inadequate volume of blood received in culture bottles   Culture   Final    NO GROWTH 5 DAYS Performed at [facility], [place] [address], [place], [place] [address]    Report Status 05/11/2018 FINAL  Final  Culture, blood (Routine x 2)     Status: None   Collection Time: 05/06/18 10:05 PM  Result Value Ref Range Status   Specimen Description BLOOD RIGHT WRIST  Final   Special Requests   Final    BOTTLES DRAWN AEROBIC AND ANAEROBIC Blood Culture adequate volume   Culture   Final    NO GROWTH 5 DAYS Performed at [facility], [place] [address] [place], [place] [address]    Report Status 05/11/2018 FINAL  Final  Urine Culture     Status: None   Collection Time: 05/07/18  7:02 AM  Result Value Ref Range Status   Specimen Description URINE, RANDOM  Final   Special Requests NONE  Final   Culture   Final    NO GROWTH Performed at [facility], [place] [address], [place], [place] [address]    Report Status 05/08/2018 FINAL  Final  MRSA PCR Screening     Status: None   Collection Time: 05/08/18  7:38 AM  Result Value Ref Range Status   MRSA by PCR NEGATIVE NEGATIVE Final    Comment:        The GeneXpert MRSA Assay (FDA approved for NASAL specimens only), is one component of a comprehensive MRSA colonization surveillance program. It is not intended to diagnose MRSA infection nor to guide or monitor treatment for MRSA infections. Performed at [facility], [place] [address], [place], [place] [address]   Culture, sputum-assessment     Status: None   Collection Time: 05/13/18  7:31 AM  Result Value Ref Range Status   Specimen Description SPUTUM  Final  Special Requests NONE  Final   Sputum evaluation   Final     THIS SPECIMEN IS ACCEPTABLE FOR SPUTUM CULTURE Performed at [place] [address] [place], [place] [address]    Report Status 05/13/2018 FINAL  Final  Culture, respiratory     Status: None (Preliminary result)   Collection Time: 05/13/18  7:31 AM  Result Value Ref Range Status   Specimen Description   Final    SPUTUM Performed at [place] [address] [place], [place] [address]    Special Requests   Final    NONE Reflexed from [id number] Performed at [facility], [place] [address] [place], [place] [address]    Gram Stain   Final    RARE WBC PRESENT,BOTH PMN AND MONONUCLEAR FEW GRAM POSITIVE COCCI IN PAIRS FEW GRAM NEGATIVE RODS Performed at [facility], [place] [address], [place], [place] [address]    Culture PENDING  Incomplete   Report Status PENDING  Incomplete      Radiology Studies: Dg Chest 2 View  Result Date: 05/12/2018 CLINICAL DATA:  Shortness of breath. Diagnosed with lung cancer 4 days ago. History of colon cancer. EXAM: CHEST - 2 VIEW COMPARISON:  Chest radiograph May 06, 2018 FINDINGS: Persistent RIGHT lung base consolidation. Increasing lobulated moderate RIGHT, small LEFT pleural effusions. Cardiac silhouette is upper limits of normal size. Calcified aortic arch. Increased lung volumes with mild apical bullous changes suspected. No pneumothorax. Soft tissue planes and included osseous structures are non suspicious. IMPRESSION: Increasing moderate RIGHT and small LEFT pleural effusions. Persistent consolidation RIGHT lower lobe. Aortic Atherosclerosis (ICD10-I70.0). Emphysema (ICD10-J43.9). Electronically Signed   By: Elon Alas M.D.   On: 05/12/2018 17:52   Ct Angio Chest Pe W/cm &/or Wo Cm  Result Date: 05/12/2018 CLINICAL DATA:  Short of breath.  History of lung cancer EXAM: CT ANGIOGRAPHY CHEST WITH CONTRAST TECHNIQUE: Multidetector CT imaging of the chest was performed using the  standard protocol during bolus administration of intravenous contrast. Multiplanar CT image reconstructions and MIPs were obtained to evaluate the vascular anatomy. CONTRAST:  126m ISOVUE-370 IOPAMIDOL (ISOVUE-370) INJECTION 76% COMPARISON:  Chest CT 04/22/2018 FINDINGS: Cardiovascular: Negative for pulmonary embolism. Pulmonary arteries normal in caliber. Atherosclerotic calcification aortic arch without aneurysm or dissection. Heart size within normal limits. Mediastinum/Nodes: Negative for mass or adenopathy Lungs/Pleura: Persistent right lower lobe collapse with small right effusion. No obstructing mass lesion. Recent biopsy positive for right lower low adenocarcinoma. Severe emphysema. Left lower lobe scarring. No effusion on the left. Upper Abdomen: Negative Musculoskeletal: Multilevel degenerative change. No acute skeletal abnormality. Review of the MIP images confirms the above findings. IMPRESSION: Negative for pulmonary embolism Persistent right lower lobe collapse and small right effusion. Aortic Atherosclerosis (ICD10-I70.0) and Emphysema (ICD10-J43.9). Electronically Signed   By: CFranchot GalloM.D.   On: 05/12/2018 20:09      Scheduled Meds: . arformoterol  15 mcg Nebulization BID  . atorvastatin  10 mg Oral q morning - 10a  . budesonide (PULMICORT) nebulizer solution  0.5 mg Nebulization BID  . calcium-vitamin D  1 tablet Oral q morning - 10a  . docusate sodium  100 mg Oral QHS  . folic acid  1 mg Oral q morning - 10a  . irbesartan  150 mg Oral Daily   And  . hydrochlorothiazide  12.5 mg Oral Daily  . HYDROcodone-homatropine  5 mL Oral QHS,MR X 1  . ipratropium-albuterol  3 mL Nebulization TID  .  iron polysaccharides  150 mg Oral Daily  . loratadine  10 mg Oral QHS  . methylPREDNISolone (SOLU-MEDROL) injection  60 mg Intravenous Q6H  . multivitamin with minerals  1 tablet Oral q morning - 10a  . vitamin B-12  2,500 mcg Oral q morning - 10a   Continuous Infusions: . ceFEPime  (MAXIPIME) IV Stopped (05/13/18 1115)  . vancomycin       LOS: 1 day    Time spent: Total of 35 minutes spent with pt, greater than 50% of which was spent in discussion of  treatment, counseling and coordination of care   Chipper Oman, MD Pager: Text Page via [email or web address]   If 7PM-7AM, please contact night-coverage [email or web address] 05/13/2018, 2:08 PM   Note - This record has been created using [employer]. Chart creation errors have been sought, but may not always have been located. Such creation errors do not reflect on the standard of medical care.

## 2018-05-13 NOTE — Progress Notes (Deleted)
desats in upper 70's, respiratory paged. oxgyen increased, pt anxious, husband bedside.

## 2018-05-13 NOTE — Progress Notes (Signed)
Pt calming down, oxygen sats in 90s, respiratory in room. Will continue to monitor.

## 2018-05-14 ENCOUNTER — Other Ambulatory Visit: Payer: Self-pay | Admitting: *Deleted

## 2018-05-14 LAB — CBC
HCT: 23.7 % — ABNORMAL LOW (ref 36.0–46.0)
HEMOGLOBIN: 7.6 g/dL — AB (ref 12.0–15.0)
MCH: 29.3 pg (ref 26.0–34.0)
MCHC: 32.1 g/dL (ref 30.0–36.0)
MCV: 91.5 fL (ref 78.0–100.0)
PLATELETS: 290 10*3/uL (ref 150–400)
RBC: 2.59 MIL/uL — AB (ref 3.87–5.11)
RDW: 14.5 % (ref 11.5–15.5)
WBC: 8.2 10*3/uL (ref 4.0–10.5)

## 2018-05-14 MED ORDER — SODIUM CHLORIDE 0.9 % IV SOLN
125.0000 mg | Freq: Once | INTRAVENOUS | Status: AC
  Administered 2018-05-14: 125 mg via INTRAVENOUS
  Filled 2018-05-14: qty 10

## 2018-05-14 MED ORDER — METHYLPREDNISOLONE SODIUM SUCC 125 MG IJ SOLR
60.0000 mg | Freq: Two times a day (BID) | INTRAMUSCULAR | Status: DC
  Administered 2018-05-15 – 2018-05-16 (×4): 60 mg via INTRAVENOUS
  Filled 2018-05-14 (×4): qty 2

## 2018-05-14 NOTE — Progress Notes (Signed)
PROGRESS NOTE [facility]   Casey Wilkinson   ZWC:[id number] DOB: 05/05/1941  DOA: 05/12/2018 PCP: Shon Baton, MD   Brief Narrative:  Casey Wilkinson is a 77 year old female with medical history significant for COPD on 2 L oxygen at home, hypertension, discoid lupus and recently diagnosed with adenocarcinoma right lung.  Patient was recently discharged on 05/09/2018 after being admitted for pneumonia, underwent bronchoscopy and found to have adenocarcinoma of the right lung.  Patient was discharged on antibiotic therapy, however over the past 3 days she has become more short of breath especially with exertion and was dropping her saturations and decided to come to the emergency room.  Upon ED evaluation CT of the chest showed persistent right lower lobe collapse/infiltrate and small right pleural effusion patient was started on empiric antibiotic and received steroids.  She is admitted with working diagnosis of post obstructive pneumonia.  Subjective: Patient seen and examined, feeling better, however cough with streaks of blood continue.  Breathing has improved.  Also complaining of some back pain today.  No other concerns  Assessment & Plan: Acute on chronic respiratory failure with hypoxia due to postobstructive pneumonia/adenocarcinoma of the right lower lobe. Patient has been started on cefepime and vancomycin for postobstructive/HCAP pneumonia.  MRSA PCR negative, will discontinue vancomycin, continue cefepime for now.  Continue Solu-Medrol will taper to 60 mg twice daily.  Continue nebulizer treatment with Brovana and Pulmicort and incentive spirometry. Continue albuterol as needed.  Case discussed with pulmonology no need for further interventions and recommended to continue to treat with antibiotic therapy for now.  COPD See above  Hypertension BP remains stable, continue current regimen  Adenocarcinoma of right lung Patient has not see oncology yet, will need outpatient  follow up after recover from this setback. No needs for consult Oncology at this time.   Anemia of chronic disease/Iron deficiency Will add IV iron  Hgb 7.6 today, monitor CBC and transfuse if Hgb < 7   DVT prophylaxis: SCD's, no lovenox as patient was c/o hemoptysis upon admission  Code Status: Full Code Family Communication: Family at bedside  Disposition Plan: Home in 2-3 days if respiratory status improves   Consultants:   None  Procedures:   None  Antimicrobials: Anti-infectives (From admission, onward)   Start     Dose/Rate Route Frequency Ordered Stop   05/13/18 2200  vancomycin (VANCOCIN) IVPB 1000 mg/200 mL premix  Status:  Discontinued     1,000 mg 200 mL/hr over 60 Minutes Intravenous Every 24 hours 05/12/18 2131 05/14/18 1354   05/13/18 1000  levofloxacin (LEVAQUIN) tablet 500 mg  Status:  Discontinued     500 mg Oral Daily 05/12/18 2037 05/12/18 2039   05/12/18 2200  ceFEPIme (MAXIPIME) 1 g in sodium chloride 0.9 % 100 mL IVPB     1 g 200 mL/hr over 30 Minutes Intravenous Every 12 hours 05/12/18 2126     05/12/18 2200  vancomycin (VANCOCIN) 1,500 mg in sodium chloride 0.9 % 500 mL IVPB     1,500 mg 250 mL/hr over 120 Minutes Intravenous  Once 05/12/18 2130 05/13/18 0204      Objective: Vitals:   05/14/18 0519 05/14/18 0807 05/14/18 1414 05/14/18 1428  BP: 140/61  (!) 144/67   Pulse: 97  (!) 105   Resp:   16   Temp: 97.9 F (36.6 C)  97.9 F (36.6 C)   TempSrc: Oral  Oral   SpO2: 97% 95% 96% 95%  Weight:  Height:        Intake/Output Summary (Last 24 hours) at 05/14/2018 1534 Last data filed at 05/14/2018 1300 Gross per 24 hour  Intake 1300 ml  Output 2600 ml  Net -1300 ml   Filed Weights   05/12/18 1722  Weight: 68.9 kg (152 lb)    Examination:  General: Pt is alert, awake, not in acute distress Cardiovascular: RRR, S1/S2 +, no rubs, no gallops Respiratory: Decrease air entry, however improved from yesterday, diffuse late expiratory  wheezing and mild rhonchi at the RLL.  Abdominal: Soft, NT, ND, bowel sounds + Extremities: no edema, no cyanosis   Data Reviewed: I have personally reviewed following labs and imaging studies  CBC: Recent Labs  Lab 05/08/18 0433 05/12/18 1838 05/14/18 0423  WBC 7.3 10.4 8.2  NEUTROABS  --  9.0*  --   HGB 7.6* 8.1* 7.6*  HCT 24.4* 25.1* 23.7*  MCV 94.6 91.6 91.5  PLT 183 258 771   Basic Metabolic Panel: Recent Labs  Lab 05/08/18 0433 05/12/18 1838  NA 139 137  K 3.4* 4.2  CL 108 98  CO2 24 26  GLUCOSE 83 109*  BUN 10 15  CREATININE 0.62 0.70  CALCIUM 8.4* 9.4   GFR: Estimated Creatinine Clearance: 56.5 mL/min (by C-G formula based on SCr of 0.7 mg/dL). Liver Function Tests: No results for input(s): AST, ALT, ALKPHOS, BILITOT, PROT, ALBUMIN in the last 168 hours. No results for input(s): LIPASE, AMYLASE in the last 168 hours. No results for input(s): AMMONIA in the last 168 hours. Coagulation Profile: No results for input(s): INR, PROTIME in the last 168 hours. Cardiac Enzymes: No results for input(s): CKTOTAL, CKMB, CKMBINDEX, TROPONINI in the last 168 hours. BNP (last 3 results) No results for input(s): PROBNP in the last 8760 hours. HbA1C: No results for input(s): HGBA1C in the last 72 hours. CBG: No results for input(s): GLUCAP in the last 168 hours. Lipid Profile: No results for input(s): CHOL, HDL, LDLCALC, TRIG, CHOLHDL, LDLDIRECT in the last 72 hours. Thyroid Function Tests: No results for input(s): TSH, T4TOTAL, FREET4, T3FREE, THYROIDAB in the last 72 hours. Anemia Panel: No results for input(s): VITAMINB12, FOLATE, FERRITIN, TIBC, IRON, RETICCTPCT in the last 72 hours. Sepsis Labs: No results for input(s): PROCALCITON, LATICACIDVEN in the last 168 hours.  Recent Results (from the past 240 hour(s))  Culture, bal-quantitative     Status: Abnormal   Collection Time: 05/06/18  3:08 PM  Result Value Ref Range Status   Specimen Description BRONCHIAL  ALVEOLAR LAVAGE  Final   Special Requests NONE  Final   Gram Stain   Final    ABUNDANT WBC PRESENT,BOTH PMN AND MONONUCLEAR RARE GRAM POSITIVE COCCI    Culture (A)  Final    >=100,000 COLONIES/mL Consistent with normal respiratory flora. Performed at [facility], [place] [address] [place], [place] [address]    Report Status 05/09/2018 FINAL  Final  Fungus Culture With Stain     Status: None (Preliminary result)   Collection Time: 05/06/18  3:08 PM  Result Value Ref Range Status   Fungus Stain Final report  Final    Comment: (NOTE) Performed At: [facility] [place], [place] [id number] Rush Farmer MD [id number]    Fungus (Mycology) Culture PENDING  Incomplete   Fungal Source BRONCHIAL ALVEOLAR LAVAGE  Final    Comment: RLL  Acid Fast Smear (AFB)     Status: None   Collection Time: 05/06/18  3:08 PM  Result  Value Ref Range Status   AFB Specimen Processing Concentration  Final   Acid Fast Smear Negative  Final    Comment: (NOTE) Performed At: [facility] [place], [place] [id number] Rush Farmer MD [id number]    Source (AFB) BRONCHIAL ALVEOLAR LAVAGE  Final    Comment: RLL  Fungus Culture Result     Status: None   Collection Time: 05/06/18  3:08 PM  Result Value Ref Range Status   Result 1 Comment  Final    Comment: (NOTE) KOH/Calcofluor preparation:  no fungus observed. Performed At: [facility] [place], [place] [id number] Rush Farmer MD [id number]   Culture, blood (Routine x 2)     Status: None   Collection Time: 05/06/18  9:57 PM  Result Value Ref Range Status   Specimen Description BLOOD RIGHT ANTECUBITAL  Final   Special Requests   Final    BOTTLES DRAWN AEROBIC AND ANAEROBIC Blood Culture results may not be optimal due to an inadequate volume of blood received in culture bottles   Culture   Final    NO GROWTH 5 DAYS Performed at [facility], [place] [address]  [place], [place] [address]    Report Status 05/11/2018 FINAL  Final  Culture, blood (Routine x 2)     Status: None   Collection Time: 05/06/18 10:05 PM  Result Value Ref Range Status   Specimen Description BLOOD RIGHT WRIST  Final   Special Requests   Final    BOTTLES DRAWN AEROBIC AND ANAEROBIC Blood Culture adequate volume   Culture   Final    NO GROWTH 5 DAYS Performed at [facility], [place] [address] [place], [place] [address]    Report Status 05/11/2018 FINAL  Final  Urine Culture     Status: None   Collection Time: 05/07/18  7:02 AM  Result Value Ref Range Status   Specimen Description URINE, RANDOM  Final   Special Requests NONE  Final   Culture   Final    NO GROWTH Performed at [facility], [place] [address] [place], [place] [address]    Report Status 05/08/2018 FINAL  Final  MRSA PCR Screening     Status: None   Collection Time: 05/08/18  7:38 AM  Result Value Ref Range Status   MRSA by PCR NEGATIVE NEGATIVE Final    Comment:        The GeneXpert MRSA Assay (FDA approved for NASAL specimens only), is one component of a comprehensive MRSA colonization surveillance program. It is not intended to diagnose MRSA infection nor to guide or monitor treatment for MRSA infections. Performed at [facility], [place] [address], [place] , [place] [address]   Culture, blood (routine x 2) Call MD if unable to obtain prior to antibiotics being given     Status: None (Preliminary result)   Collection Time: 05/12/18  8:45 PM  Result Value Ref Range Status   Specimen Description   Final    BLOOD LEFT HAND Performed at [place] [address] [place], [place] [address]    Special Requests   Final    BOTTLES DRAWN AEROBIC AND ANAEROBIC Blood Culture adequate volume Performed at [place] [address] [place], [place] [address]    Culture   Final    NO GROWTH 1 DAY Performed at [facility], [place] [address]  [place], [place] [address]    Report  Status PENDING  Incomplete  Culture, blood (routine x 2) Call MD if unable to obtain prior to antibiotics being given     Status: None (Preliminary result)   Collection Time: 05/12/18 11:16 PM  Result Value Ref Range Status   Specimen Description   Final    BLOOD LEFT ANTECUBITAL Performed at [place] [address] [place], [place] [address]    Special Requests   Final    BOTTLES DRAWN AEROBIC AND ANAEROBIC Blood Culture adequate volume Performed at [place] [address], [place], [place] [address]    Culture   Final    NO GROWTH 1 DAY Performed at [facility], [place] [address] [place], [place] [address]    Report Status PENDING  Incomplete  Culture, sputum-assessment     Status: None   Collection Time: 05/13/18  7:31 AM  Result Value Ref Range Status   Specimen Description SPUTUM  Final   Special Requests NONE  Final   Sputum evaluation   Final    THIS SPECIMEN IS ACCEPTABLE FOR SPUTUM CULTURE Performed at [facility], [place] [address], [place], [place] [address]    Report Status 05/13/2018 FINAL  Final  Culture, respiratory     Status: None (Preliminary result)   Collection Time: 05/13/18  7:31 AM  Result Value Ref Range Status   Specimen Description   Final    SPUTUM Performed at [place] [address], [place], [place] [address]    Special Requests   Final    NONE Reflexed from [phone] Performed at [facility], [place] [address], [place], [place] [address]    Gram Stain   Final    RARE WBC PRESENT,BOTH PMN AND MONONUCLEAR FEW GRAM POSITIVE COCCI IN PAIRS FEW GRAM NEGATIVE RODS    Culture   Final    CULTURE REINCUBATED FOR BETTER GROWTH Performed at [facility], [place] [address] [place], [place] [address]    Report Status PENDING  Incomplete      Radiology Studies: Dg Chest 2 View  Result Date:  05/12/2018 CLINICAL DATA:  Shortness of breath. Diagnosed with lung cancer 4 days ago. History of colon cancer. EXAM: CHEST - 2 VIEW COMPARISON:  Chest radiograph May 06, 2018 FINDINGS: Persistent RIGHT lung base consolidation. Increasing lobulated moderate RIGHT, small LEFT pleural effusions. Cardiac silhouette is upper limits of normal size. Calcified aortic arch. Increased lung volumes with mild apical bullous changes suspected. No pneumothorax. Soft tissue planes and included osseous structures are non suspicious. IMPRESSION: Increasing moderate RIGHT and small LEFT pleural effusions. Persistent consolidation RIGHT lower lobe. Aortic Atherosclerosis (ICD10-I70.0). Emphysema (ICD10-J43.9). Electronically Signed   By: Elon Alas M.D.   On: 05/12/2018 17:52   Ct Angio Chest Pe W/cm &/or Wo Cm  Result Date: 05/12/2018 CLINICAL DATA:  Short of breath.  History of lung cancer EXAM: CT ANGIOGRAPHY CHEST WITH CONTRAST TECHNIQUE: Multidetector CT imaging of the chest was performed using the standard protocol during bolus administration of intravenous contrast. Multiplanar CT image reconstructions and MIPs were obtained to evaluate the vascular anatomy. CONTRAST:  140m ISOVUE-370 IOPAMIDOL (ISOVUE-370) INJECTION 76% COMPARISON:  Chest CT 04/22/2018 FINDINGS: Cardiovascular: Negative for pulmonary embolism. Pulmonary arteries normal in caliber. Atherosclerotic calcification aortic arch without aneurysm or dissection. Heart size within normal limits. Mediastinum/Nodes: Negative for mass or adenopathy Lungs/Pleura: Persistent right lower lobe collapse with small right effusion. No obstructing mass lesion. Recent biopsy positive for  right lower low adenocarcinoma. Severe emphysema. Left lower lobe scarring. No effusion on the left. Upper Abdomen: Negative Musculoskeletal: Multilevel degenerative change. No acute skeletal abnormality. Review of the MIP images confirms the above findings. IMPRESSION: Negative for  pulmonary embolism Persistent right lower lobe collapse and small right effusion. Aortic Atherosclerosis (ICD10-I70.0) and Emphysema (ICD10-J43.9). Electronically Signed   By: Franchot Gallo M.D.   On: 05/12/2018 20:09      Scheduled Meds: . arformoterol  15 mcg Nebulization BID  . atorvastatin  10 mg Oral q morning - 10a  . budesonide (PULMICORT) nebulizer solution  0.5 mg Nebulization BID  . calcium-vitamin D  1 tablet Oral q morning - 10a  . docusate sodium  100 mg Oral QHS  . folic acid  1 mg Oral q morning - 10a  . irbesartan  150 mg Oral Daily   And  . hydrochlorothiazide  12.5 mg Oral Daily  . HYDROcodone-homatropine  5 mL Oral QHS,MR X 1  . ipratropium-albuterol  3 mL Nebulization TID  . iron polysaccharides  150 mg Oral Daily  . loratadine  10 mg Oral QHS  . methylPREDNISolone (SOLU-MEDROL) injection  60 mg Intravenous Q6H  . multivitamin with minerals  1 tablet Oral q morning - 10a  . vitamin B-12  2,500 mcg Oral q morning - 10a   Continuous Infusions: . ceFEPime (MAXIPIME) IV Stopped (05/14/18 1016)     LOS: 2 days    Time spent: Total of 25 minutes spent with pt, greater than 50% of which was spent in discussion of  treatment, counseling and coordination of care   Chipper Oman, MD Pager: Text Page via [email or web address]   If 7PM-7AM, please contact night-coverage [email or web address] 05/14/2018, 3:34 PM   Note - This record has been created using [employer]. Chart creation errors have been sought, but may not always have been located. Such creation errors do not reflect on the standard of medical care.

## 2018-05-15 ENCOUNTER — Inpatient Hospital Stay (HOSPITAL_COMMUNITY): Payer: Medicare Other

## 2018-05-15 LAB — PREPARE RBC (CROSSMATCH)

## 2018-05-15 LAB — CULTURE, RESPIRATORY: CULTURE: NORMAL

## 2018-05-15 LAB — CULTURE, RESPIRATORY W GRAM STAIN

## 2018-05-15 LAB — CBC
HEMATOCRIT: 24.2 % — AB (ref 36.0–46.0)
HEMOGLOBIN: 7.7 g/dL — AB (ref 12.0–15.0)
MCH: 29.4 pg (ref 26.0–34.0)
MCHC: 31.8 g/dL (ref 30.0–36.0)
MCV: 92.4 fL (ref 78.0–100.0)
Platelets: 301 10*3/uL (ref 150–400)
RBC: 2.62 MIL/uL — ABNORMAL LOW (ref 3.87–5.11)
RDW: 14.7 % (ref 11.5–15.5)
WBC: 9.7 10*3/uL (ref 4.0–10.5)

## 2018-05-15 MED ORDER — SODIUM CHLORIDE 0.9% IV SOLUTION
Freq: Once | INTRAVENOUS | Status: AC
Start: 2018-05-15 — End: 2018-05-15
  Administered 2018-05-15: 21:00:00 via INTRAVENOUS

## 2018-05-15 NOTE — Progress Notes (Signed)
PROGRESS NOTE [facility]   TASHAWNDA BLEILER   OZD:[id number] DOB: [birth year]  DOA: 05/12/2018 PCP: Shon Baton, MD   Brief Narrative:  Casey Wilkinson is a 77 year old female with medical history significant for COPD on 2 L oxygen at home, hypertension, discoid lupus and recently diagnosed with adenocarcinoma right lung.  Patient was recently discharged on 05/09/2018 after being admitted for pneumonia, underwent bronchoscopy and found to have adenocarcinoma of the right lung.  Patient was discharged on antibiotic therapy, however over the past 3 days she has become more short of breath especially with exertion and was dropping her saturations and decided to come to the emergency room.  Upon ED evaluation CT of the chest showed persistent right lower lobe collapse/infiltrate and small right pleural effusion patient was started on empiric antibiotic and received steroids.  She is admitted with working diagnosis of post obstructive pneumonia.  Subjective: Patient seen and examined, she is more short of breath today especially with exertion, cough has improved. Continues to be afebrile.  Chest x-ray with no significant changes.  Assessment & Plan: Acute on chronic respiratory failure with hypoxia due to postobstructive pneumonia/adenocarcinoma of the right lower lobe. Patient has been started on cefepime and vancomycin for postobstructive/HCAP pneumonia. MRSA PCR negative, vancomycin has been discontinued, continue cefepime for now.  Continue Solu-Medrol at current dose.  Continue nebulizer treatment with Brovana and Pulmicort and incentive spirometry. Continue albuterol as needed.  If no significant improvement will consult pulmonology.  COPD See above  Hypertension BP remains stable, continue current management.  Adenocarcinoma of right lung Patient has not see oncology yet, will need outpatient follow up after recover from this setback. No needs for consult Oncology at this time.    Anemia of chronic disease/Iron deficiency Hemoglobin today 7.7, will transfuse 1 unit given significant shortness of breath, hopefully this improves her symptoms.  Continue to monitor CBC   DVT prophylaxis: SCD's, no lovenox as patient was c/o hemoptysis upon admission  Code Status: Full Code Family Communication: Family at bedside  Disposition Plan: Home in 2-3 days if respiratory status improves   Consultants:   None  Procedures:   None  Antimicrobials: Anti-infectives (From admission, onward)   Start     Dose/Rate Route Frequency Ordered Stop   05/13/18 2200  vancomycin (VANCOCIN) IVPB 1000 mg/200 mL premix  Status:  Discontinued     1,000 mg 200 mL/hr over 60 Minutes Intravenous Every 24 hours 05/12/18 2131 05/14/18 1354   05/13/18 1000  levofloxacin (LEVAQUIN) tablet 500 mg  Status:  Discontinued     500 mg Oral Daily 05/12/18 2037 05/12/18 2039   05/12/18 2200  ceFEPIme (MAXIPIME) 1 g in sodium chloride 0.9 % 100 mL IVPB     1 g 200 mL/hr over 30 Minutes Intravenous Every 12 hours 05/12/18 2126     05/12/18 2200  vancomycin (VANCOCIN) 1,500 mg in sodium chloride 0.9 % 500 mL IVPB     1,500 mg 250 mL/hr over 120 Minutes Intravenous  Once 05/12/18 2130 05/13/18 0204      Objective: Vitals:   05/15/18 1007 05/15/18 1014 05/15/18 1018 05/15/18 1344  BP:    136/62  Pulse:    (!) 106  Resp:    16  Temp:    97.8 F (36.6 C)  TempSrc:    Oral  SpO2: 93% 93% 93% 98%  Weight:      Height:        Intake/Output Summary (Last 24 hours) at 05/15/2018  [place] filed at 05/15/2018 1345 Gross per 24 hour  Intake 480 ml  Output 900 ml  Net -420 ml   Filed Weights   05/12/18 1722  Weight: 68.9 kg (152 lb)    Examination:  General: Ill looking  Cardiovascular: RRR, S1/S2 +, no rubs, no gallops Respiratory: Decreased breath sounds bilaterally, diffuse rhonchi and wheezing, tachypneic Abdominal: Soft, NT, ND Extremities: no edema  Data Reviewed: I have  personally reviewed following labs and imaging studies  CBC: Recent Labs  Lab 05/12/18 1838 05/14/18 0423 05/15/18 0415  WBC 10.4 8.2 9.7  NEUTROABS 9.0*  --   --   HGB 8.1* 7.6* 7.7*  HCT 25.1* 23.7* 24.2*  MCV 91.6 91.5 92.4  PLT 258 290 756   Basic Metabolic Panel: Recent Labs  Lab 05/12/18 1838  NA 137  K 4.2  CL 98  CO2 26  GLUCOSE 109*  BUN 15  CREATININE 0.70  CALCIUM 9.4   GFR: Estimated Creatinine Clearance: 56.5 mL/min (by C-G formula based on SCr of 0.7 mg/dL). Liver Function Tests: No results for input(s): AST, ALT, ALKPHOS, BILITOT, PROT, ALBUMIN in the last 168 hours. No results for input(s): LIPASE, AMYLASE in the last 168 hours. No results for input(s): AMMONIA in the last 168 hours. Coagulation Profile: No results for input(s): INR, PROTIME in the last 168 hours. Cardiac Enzymes: No results for input(s): CKTOTAL, CKMB, CKMBINDEX, TROPONINI in the last 168 hours. BNP (last 3 results) No results for input(s): PROBNP in the last 8760 hours. HbA1C: No results for input(s): HGBA1C in the last 72 hours. CBG: No results for input(s): GLUCAP in the last 168 hours. Lipid Profile: No results for input(s): CHOL, HDL, LDLCALC, TRIG, CHOLHDL, LDLDIRECT in the last 72 hours. Thyroid Function Tests: No results for input(s): TSH, T4TOTAL, FREET4, T3FREE, THYROIDAB in the last 72 hours. Anemia Panel: No results for input(s): VITAMINB12, FOLATE, FERRITIN, TIBC, IRON, RETICCTPCT in the last 72 hours. Sepsis Labs: No results for input(s): PROCALCITON, LATICACIDVEN in the last 168 hours.  Recent Results (from the past 240 hour(s))  Culture, bal-quantitative     Status: Abnormal   Collection Time: 05/06/18  3:08 PM  Result Value Ref Range Status   Specimen Description BRONCHIAL ALVEOLAR LAVAGE  Final   Special Requests NONE  Final   Gram Stain   Final    ABUNDANT WBC PRESENT,BOTH PMN AND MONONUCLEAR RARE GRAM POSITIVE COCCI    Culture (A)  Final    >=100,000  COLONIES/mL Consistent with normal respiratory flora. Performed at [facility], [place] [address], [place], [place] [address]    Report Status 05/09/2018 FINAL  Final  Fungus Culture With Stain     Status: None (Preliminary result)   Collection Time: 05/06/18  3:08 PM  Result Value Ref Range Status   Fungus Stain Final report  Final    Comment: (NOTE) Performed At: [facility] [place], [place] [id number] Rush Farmer MD YT:[id number]    Fungus (Mycology) Culture PENDING  Incomplete   Fungal Source BRONCHIAL ALVEOLAR LAVAGE  Final    Comment: RLL  Acid Fast Smear (AFB)     Status: None   Collection Time: 05/06/18  3:08 PM  Result Value Ref Range Status   AFB Specimen Processing Concentration  Final   Acid Fast Smear Negative  Final    Comment: (NOTE) Performed At: [facility] [address] [place], [place] [id number] Rush Farmer MD [id number]    Source (AFB)  BRONCHIAL ALVEOLAR LAVAGE  Final    Comment: RLL  Fungus Culture Result     Status: None   Collection Time: 05/06/18  3:08 PM  Result Value Ref Range Status   Result 1 Comment  Final    Comment: (NOTE) KOH/Calcofluor preparation:  no fungus observed. Performed At: [facility] [place], [place] [id number] Rush Farmer MD TD:[id number]   Culture, blood (Routine x 2)     Status: None   Collection Time: 05/06/18  9:57 PM  Result Value Ref Range Status   Specimen Description BLOOD RIGHT ANTECUBITAL  Final   Special Requests   Final    BOTTLES DRAWN AEROBIC AND ANAEROBIC Blood Culture results may not be optimal due to an inadequate volume of blood received in culture bottles   Culture   Final    NO GROWTH 5 DAYS Performed at [facility], [place] [address] [place], [place] [address]    Report Status 05/11/2018 FINAL  Final  Culture, blood (Routine x 2)     Status: None   Collection Time: 05/06/18 10:05 PM  Result Value Ref Range Status    Specimen Description BLOOD RIGHT WRIST  Final   Special Requests   Final    BOTTLES DRAWN AEROBIC AND ANAEROBIC Blood Culture adequate volume   Culture   Final    NO GROWTH 5 DAYS Performed at [facility], [place] [address], [place], [place] [address]    Report Status 05/11/2018 FINAL  Final  Urine Culture     Status: None   Collection Time: 05/07/18  7:02 AM  Result Value Ref Range Status   Specimen Description URINE, RANDOM  Final   Special Requests NONE  Final   Culture   Final    NO GROWTH Performed at [facility], [place] [address], [place], [place] [address]    Report Status 05/08/2018 FINAL  Final  MRSA PCR Screening     Status: None   Collection Time: 05/08/18  7:38 AM  Result Value Ref Range Status   MRSA by PCR NEGATIVE NEGATIVE Final    Comment:        The GeneXpert MRSA Assay (FDA approved for NASAL specimens only), is one component of a comprehensive MRSA colonization surveillance program. It is not intended to diagnose MRSA infection nor to guide or monitor treatment for MRSA infections. Performed at [facility], [place] [address] [place], [place] [address]   Culture, blood (routine x 2) Call MD if unable to obtain prior to antibiotics being given     Status: None (Preliminary result)   Collection Time: 05/12/18  8:45 PM  Result Value Ref Range Status   Specimen Description   Final    BLOOD LEFT HAND Performed at [place] [address] [place], [place] [address]    Special Requests   Final    BOTTLES DRAWN AEROBIC AND ANAEROBIC Blood Culture adequate volume Performed at [place] [address], [place], [place] [address]    Culture   Final    NO GROWTH 2 DAYS Performed at [place] [address] [place], [place] [address]    Report Status PENDING  Incomplete  Culture, blood (routine x 2) Call MD if unable to obtain prior to antibiotics being given     Status: None (Preliminary  result)   Collection Time: 05/12/18 11:16 PM  Result Value Ref Range Status   Specimen  Description   Final    BLOOD LEFT ANTECUBITAL Performed at [place] [address], [place], [place] [address]    Special Requests   Final    BOTTLES DRAWN AEROBIC AND ANAEROBIC Blood Culture adequate volume Performed at [place] [address], [place], [place] [address]    Culture   Final    NO GROWTH 2 DAYS Performed at [place] [address], [place], [place] [address]    Report Status PENDING  Incomplete  Culture, sputum-assessment     Status: None   Collection Time: 05/13/18  7:31 AM  Result Value Ref Range Status   Specimen Description SPUTUM  Final   Special Requests NONE  Final   Sputum evaluation   Final    THIS SPECIMEN IS ACCEPTABLE FOR SPUTUM CULTURE Performed at [facility], [place] [address] [place], [place] [address]    Report Status 05/13/2018 FINAL  Final  Culture, respiratory     Status: None   Collection Time: 05/13/18  7:31 AM  Result Value Ref Range Status   Specimen Description   Final    SPUTUM Performed at [place] [address], [place], [place] [address]    Special Requests   Final    NONE Reflexed from [id number] Performed at [facility], [place] [address], [place], [place] [address]    Gram Stain   Final    RARE WBC PRESENT,BOTH PMN AND MONONUCLEAR FEW GRAM POSITIVE COCCI IN PAIRS FEW GRAM NEGATIVE RODS    Culture   Final    Consistent with normal respiratory flora. Performed at [facility], [place] [address] [place], [place] [address]    Report Status 05/15/2018 FINAL  Final      Radiology Studies: Dg Chest Port 1 View  Result Date: 05/15/2018 CLINICAL DATA:  Shortness of breath EXAM: PORTABLE CHEST 1 VIEW COMPARISON:  05/12/2018 FINDINGS: Cardiac shadow is stable. Right pleural effusion is again noted with right basilar  infiltrate. Small left pleural effusion is seen as well. No bony abnormality is noted. IMPRESSION: The overall appearance is similar to that seen on recent CT examination with persistent predominant right basilar changes identified. Electronically Signed   By: Inez Catalina M.D.   On: 05/15/2018 12:18      Scheduled Meds: . sodium chloride   Intravenous Once  . arformoterol  15 mcg Nebulization BID  . atorvastatin  10 mg Oral q morning - 10a  . budesonide (PULMICORT) nebulizer solution  0.5 mg Nebulization BID  . calcium-vitamin D  1 tablet Oral q morning - 10a  . docusate sodium  100 mg Oral QHS  . folic acid  1 mg Oral q morning - 10a  . irbesartan  150 mg Oral Daily   And  . hydrochlorothiazide  12.5 mg Oral Daily  . HYDROcodone-homatropine  5 mL Oral QHS,MR X 1  . ipratropium-albuterol  3 mL Nebulization TID  . iron polysaccharides  150 mg Oral Daily  . loratadine  10 mg Oral QHS  . methylPREDNISolone (SOLU-MEDROL) injection  60 mg Intravenous Q12H  . multivitamin with minerals  1 tablet Oral q morning - 10a  . vitamin B-12  2,500 mcg Oral q morning - 10a   Continuous Infusions: . ceFEPime (MAXIPIME) IV 1 g (05/15/18 0947)     LOS: 3 days    Time spent: Total of 25 minutes spent with pt, greater than 50% of  which was spent in discussion of  treatment, counseling and coordination of care   Chipper Oman, MD Pager: Text Page via [email or web address]   If 7PM-7AM, please contact night-coverage [email or web address] 05/15/2018, 2:51 PM   Note - This record has been created using [employer]. Chart creation errors have been sought, but may not always have been located. Such creation errors do not reflect on the standard of medical care.

## 2018-05-15 NOTE — Care Management Important Message (Signed)
Important Message  Patient Details  Name: BAILEI BUIST MRN: [id number] Date of Birth: June 06, 1941   Medicare Important Message Given:  Yes    Kerin Salen 05/15/2018, 10:12 AMImportant Message  Patient Details  Name: GEISHA ABERNATHY MRN: [id number] Date of Birth: April 30, 1941   Medicare Important Message Given:  Yes    Kerin Salen 05/15/2018, 10:12 AM

## 2018-05-15 NOTE — Progress Notes (Signed)
PT Cancellation Note  Patient Details Name: Casey Wilkinson MRN: [id number] DOB: 09-09-1941   Cancelled Treatment:    Reason Eval/Treat Not Completed: Patient not medically ready RN reports pt is SOB and has been having tachycardia.  Pt with orders for PRBCs as well.  Will hold therapy today and check back as schedule permits.   Jovonta Levit,KATHrine E 05/15/2018, 1:08 PM Carmelia Bake, PT, DPT 05/15/2018 Pager: [phone]

## 2018-05-15 NOTE — Progress Notes (Signed)
Pharmacy Antibiotic Note  Casey Wilkinson is a 77 y.o. female admitted on 05/12/2018 with pneumonia. Patient recently in hospital 05/06/2018-05/09/2018 for persistent RLL infiltrate, treated with Vancomycin and Cefepime, then discharged home on Levofloxacin to complete course. Patient having worsening SOB and DOE, so presented to [facility] ED. Pharmacy has been consulted for Vancomycin and Cefepime dosing on admission.  Plan: Day 3 Abxs  Vancomycin d/c'd 7/25 due to negative MRSA PCR.  Will change cefepime from 1g q12 to 1g q8   Monitor renal function, cultures, clinical course.   Height: 5\' 5"  (165.1 cm) Weight: 152 lb (68.9 kg) IBW/kg (Calculated) : 57  Temp (24hrs), Avg:97.9 F (36.6 C), Min:97.8 F (36.6 C), Max:98 F (36.7 C)  Recent Labs  Lab 05/12/18 1838 05/14/18 0423 05/15/18 0415  WBC 10.4 8.2 9.7  CREATININE 0.70  --   --     Estimated Creatinine Clearance: 56.5 mL/min (by C-G formula based on SCr of 0.7 mg/dL).    Allergies  Allergen Reactions  . Prochlorperazine Edisylate Nausea Only and Other (See Comments)    **COMPAZINE**   Stroke-like symptoms  . Sulfamethoxazole Nausea Only and Other (See Comments)    Pt taking Methotrexate, Sulfur drugs could cause SEVERE fatal reaction.  . Doxycycline Hives    Antimicrobials this admission: PTA Levofloxacin  7/23 Vancomycin >> 7/25 7/23 Cefepime >>  Dose adjustments this admission: --  Microbiology results: 7/23 BCx: ngtd 7/23 Sputum: normal flora   Thank you for allowing pharmacy to be a part of this patient's care.   Adrian Saran, PharmD, BCPS Pager [phone] 05/15/2018 8:48 AM

## 2018-05-16 LAB — CBC
HCT: 29 % — ABNORMAL LOW (ref 36.0–46.0)
HEMOGLOBIN: 9.1 g/dL — AB (ref 12.0–15.0)
MCH: 28.8 pg (ref 26.0–34.0)
MCHC: 31.4 g/dL (ref 30.0–36.0)
MCV: 91.8 fL (ref 78.0–100.0)
PLATELETS: 284 10*3/uL (ref 150–400)
RBC: 3.16 MIL/uL — ABNORMAL LOW (ref 3.87–5.11)
RDW: 15.7 % — ABNORMAL HIGH (ref 11.5–15.5)
WBC: 8.1 10*3/uL (ref 4.0–10.5)

## 2018-05-16 LAB — BASIC METABOLIC PANEL
ANION GAP: 9 (ref 5–15)
BUN: 22 mg/dL (ref 8–23)
CHLORIDE: 99 mmol/L (ref 98–111)
CO2: 31 mmol/L (ref 22–32)
CREATININE: 0.59 mg/dL (ref 0.44–1.00)
Calcium: 9 mg/dL (ref 8.9–10.3)
GFR calc non Af Amer: >60 mL/min (ref >60)
Glucose, Bld: 132 mg/dL — ABNORMAL HIGH (ref 70–99)
POTASSIUM: 4.5 mmol/L (ref 3.5–5.1)
SODIUM: 139 mmol/L (ref 135–145)

## 2018-05-16 MED ORDER — LEVALBUTEROL HCL 0.63 MG/3ML IN NEBU
0.6300 mg | INHALATION_SOLUTION | Freq: Three times a day (TID) | RESPIRATORY_TRACT | Status: DC
  Administered 2018-05-16 – 2018-05-18 (×6): 0.63 mg via RESPIRATORY_TRACT
  Filled 2018-05-16 (×6): qty 3

## 2018-05-16 MED ORDER — METHYLPREDNISOLONE SODIUM SUCC 125 MG IJ SOLR
60.0000 mg | Freq: Three times a day (TID) | INTRAMUSCULAR | Status: DC
  Administered 2018-05-16 – 2018-05-19 (×8): 60 mg via INTRAVENOUS
  Filled 2018-05-16 (×9): qty 2

## 2018-05-16 MED ORDER — IPRATROPIUM BROMIDE 0.02 % IN SOLN
0.5000 mg | Freq: Three times a day (TID) | RESPIRATORY_TRACT | Status: DC
  Administered 2018-05-16 – 2018-05-18 (×6): 0.5 mg via RESPIRATORY_TRACT
  Filled 2018-05-16 (×7): qty 2.5

## 2018-05-16 MED ORDER — HYDROCORTISONE 2.5 % RE CREA
TOPICAL_CREAM | Freq: Two times a day (BID) | RECTAL | Status: DC
  Administered 2018-05-17 – 2018-05-23 (×11): via RECTAL
  Filled 2018-05-16: qty 28.35

## 2018-05-16 NOTE — Evaluation (Signed)
Physical Therapy Evaluation Patient Details Name: Casey Wilkinson MRN: [id number] DOB: [birth year] Today's Date: 05/16/2018   History of Present Illness  Pt was recently here last week and returned with Acute on chronic respiratory failure with hypoxia due to postobstructive pneumonia/adenocarcinoma of the right lower lobe.  Clinical Impression  Pt very limited with activity and assessment today due to dyspnea, O2 drop and Inc HR with little activity. Worked and educated pt a lot with simple chair and standing exercises that can still be performed in small , short intervals during times like these to keep her strong. Feel pt will benefit from continued PT while her in hospital and HHPT when she returns home.     Follow Up Recommendations Home health PT;Supervision for mobility/OOB    Equipment Recommendations  None recommended by PT    Recommendations for Other Services       Precautions / Restrictions Precautions Precautions: None Restrictions Weight Bearing Restrictions: No      Mobility  Bed Mobility Overal bed mobility: (not tested, pt was up in recliner and perfers recliner so she can breather better. )                Transfers Overall transfer level: Needs assistance Equipment used: Rolling walker (2 wheeled) Transfers: Sit to/from [facility] Sit to Stand: Min guard Stand pivot transfers: Min guard       General transfer comment: Min Guard A for safety and to steady   Ambulation/Gait Ambulation/Gait assistance: (worked on seated exercises and standing exercises only due to HR increasing to 132 with little standing exercises. )              Stairs            Wheelchair Mobility    Modified Rankin (Stroke Patients Only)       Balance Overall balance assessment: Needs assistance Sitting-balance support: No upper extremity supported;Feet supported Sitting balance-Leahy Scale: Good     Standing balance support: During  functional activity;Bilateral upper extremity supported Standing balance-Leahy Scale: Good                               Pertinent Vitals/Pain Pain Assessment: No/denies pain    Home Living Family/patient expects to be discharged to:: Private residence Living Arrangements: Spouse/significant other Available Help at Discharge: Family;Available 24 hours/day Type of Home: House Home Access: Stairs to enter Entrance Stairs-Rails: Right Entrance Stairs-Number of Steps: 2 Home Layout: One level Home Equipment: Tub bench;Walker - 2 wheels;Cane - single point;Other (comment);Bedside commode(oxygen ) Additional Comments: 2L home O2    Prior Function Level of Independence: Independent         Comments: Husband performing IADLs and pt performing ADLs and light IADLs     Hand Dominance   Dominant Hand: Right    Extremity/Trunk Assessment        Lower Extremity Assessment Lower Extremity Assessment: Generalized weakness    Cervical / Trunk Assessment Cervical / Trunk Assessment: Kyphotic  Communication   Communication: No difficulties  Cognition Arousal/Alertness: Awake/alert Behavior During Therapy: WFL for tasks assessed/performed Overall Cognitive Status: Within Functional Limits for tasks assessed                                        General Comments      Exercises Other Exercises Other Exercises:  seated work on B UE shoulder elevation 10x for breathing and upper respiratory muscle exercises  Other Exercises: seated ankle pumps B 10x, and quad/glut seates B 10x  Other Exercises: standing 2 x with 62minutes intervals with small marching monitoring HR and performing pursed lip breathing due 02  drop from 93-85 % with standing exercises.    Assessment/Plan    PT Assessment Patient needs continued PT services  PT Problem List Decreased strength;Decreased activity tolerance;Decreased balance;Decreased mobility;Decreased knowledge of use  of DME;Cardiopulmonary status limiting activity       PT Treatment Interventions DME instruction;Gait training;Functional mobility training;Stair training;Therapeutic activities;Therapeutic exercise;Balance training;Patient/family education    PT Goals (Current goals can be found in the Care Plan section)  Acute Rehab PT Goals Patient Stated Goal: "to be able to breathe better"  PT Goal Formulation: With patient Time For Goal Achievement: 05/30/18 Potential to Achieve Goals: Good    Frequency Min 3X/week   Barriers to discharge        Co-evaluation               AM-PAC PT "6 Clicks" Daily Activity  Outcome Measure Difficulty turning over in bed (including adjusting bedclothes, sheets and blankets)?: None Difficulty moving from lying on back to sitting on the side of the bed? : None Difficulty sitting down on and standing up from a chair with arms (e.g., wheelchair, bedside commode, etc,.)?: Unable Help needed moving to and from a bed to chair (including a wheelchair)?: A Little Help needed walking in hospital room?: Total Help needed climbing 3-5 steps with a railing? : Total 6 Click Score: 14    End of Session Equipment Utilized During Treatment: Gait belt;Oxygen Activity Tolerance: Patient tolerated treatment well Patient left: in chair;with call bell/phone within reach;with chair alarm set Nurse Communication: Mobility status PT Visit Diagnosis: Other abnormalities of gait and mobility (R26.89);Difficulty in walking, not elsewhere classified (R26.2)    Time: 1518-3437 PT Time Calculation (min) (ACUTE ONLY): 30 min   Charges:   PT Evaluation $PT Eval Low Complexity: 1 Low PT Treatments $Therapeutic Exercise: 8-22 mins        Clide Dales, PT Pager: [phone] 05/16/2018   Gyselle Matthew, Gatha Mayer 05/16/2018, 5:59 PM

## 2018-05-16 NOTE — Progress Notes (Signed)
PROGRESS NOTE [facility]   Casey Wilkinson   KAJ:[id number] DOB: [birth year]  DOA: 05/12/2018 PCP: Shon Baton, MD   Brief Narrative:  Casey Wilkinson is a 77 year old female with medical history significant for COPD on 2 L oxygen at home, hypertension, discoid lupus and recently diagnosed with adenocarcinoma right lung.  Patient was recently discharged on 05/09/2018 after being admitted for pneumonia, underwent bronchoscopy and found to have adenocarcinoma of the right lung.  Patient was discharged on antibiotic therapy, however over the past 3 days she has become more short of breath especially with exertion and was dropping her saturations and decided to come to the emergency room.  Upon ED evaluation CT of the chest showed persistent right lower lobe collapse/infiltrate and small right pleural effusion patient was started on empiric antibiotic and received steroids.  She is admitted with working diagnosis of post obstructive pneumonia.  Subjective: Patient seen and examined, she is up in the chair, however continues to c/o severe SOB with movement/getting out of bed. Afebrile, no acute events overnight.   Assessment & Plan: Acute on chronic respiratory failure with hypoxia due to postobstructive pneumonia/adenocarcinoma of the right lower lobe. Patient has been started on cefepime and vancomycin for postobstructive/HCAP pneumonia. MRSA PCR negative, vancomycin has been discontinued, will continue cefepime for now..  Will increase Solu-Medrol to 60 every 8 hrs.  Continue nebulizer treatment with Brovana and Pulmicort. Switch albuterol for Xopenex given tachycardia. Will discuss with pulmonology.  Of bed as tolerated, PT as able.  COPD See above  Hypertension BP remains stable, continue current management.  Adenocarcinoma of right lung Patient has not see oncology yet, will need outpatient follow up after recover from this setback. No needs for consult Oncology at this time.    Anemia of chronic disease/Iron deficiency Patient was transfused 1 unit of PRBC, was also given 1 dose of IV iron.  Hemoglobin responded well to transfusion.  No overt bleeding.  Continue to monitor CBC   DVT prophylaxis: SCD's, no lovenox as patient was c/o hemoptysis upon admission  Code Status: Full Code Family Communication: Family at bedside  Disposition Plan: Home when respiratory status improved  Consultants:   None  Procedures:   None  Antimicrobials: Anti-infectives (From admission, onward)   Start     Dose/Rate Route Frequency Ordered Stop   05/13/18 2200  vancomycin (VANCOCIN) IVPB 1000 mg/200 mL premix  Status:  Discontinued     1,000 mg 200 mL/hr over 60 Minutes Intravenous Every 24 hours 05/12/18 2131 05/14/18 1354   05/13/18 1000  levofloxacin (LEVAQUIN) tablet 500 mg  Status:  Discontinued     500 mg Oral Daily 05/12/18 2037 05/12/18 2039   05/12/18 2200  ceFEPIme (MAXIPIME) 1 g in sodium chloride 0.9 % 100 mL IVPB     1 g 200 mL/hr over 30 Minutes Intravenous Every 12 hours 05/12/18 2126     05/12/18 2200  vancomycin (VANCOCIN) 1,500 mg in sodium chloride 0.9 % 500 mL IVPB     1,500 mg 250 mL/hr over 120 Minutes Intravenous  Once 05/12/18 2130 05/13/18 0204      Objective: Vitals:   05/16/18 0547 05/16/18 0824 05/16/18 1250 05/16/18 1322  BP: 109/87  (!) 148/77   Pulse: 100 (!) 123 (!) 113 (!) 110  Resp: 18 (!) 22 16 18   Temp: 97.8 F (36.6 C)  98.2 F (36.8 C)   TempSrc: Oral  Oral   SpO2: 98% 90% 95% 93%  Weight:  Height:        Intake/Output Summary (Last 24 hours) at 05/16/2018 1512 Last data filed at 05/16/2018 1300 Gross per 24 hour  Intake 800 ml  Output 1550 ml  Net -750 ml   Filed Weights   05/12/18 1722  Weight: 68.9 kg (152 lb)    Examination:  General: NAD Cardiovascular: RRR, S1/S2 +, no rubs, no gallops Respiratory: Decreased breath sounds bilaterally, diffuse rhonchi.  No wheezing Abdominal: Soft, NT,  ND Extremities: no edema  Data Reviewed: I have personally reviewed following labs and imaging studies  CBC: Recent Labs  Lab 05/12/18 1838 05/14/18 0423 05/15/18 0415 05/16/18 0410  WBC 10.4 8.2 9.7 8.1  NEUTROABS 9.0*  --   --   --   HGB 8.1* 7.6* 7.7* 9.1*  HCT 25.1* 23.7* 24.2* 29.0*  MCV 91.6 91.5 92.4 91.8  PLT 258 290 301 161   Basic Metabolic Panel: Recent Labs  Lab 05/12/18 1838 05/16/18 0410  NA 137 139  K 4.2 4.5  CL 98 99  CO2 26 31  GLUCOSE 109* 132*  BUN 15 22  CREATININE 0.70 0.59  CALCIUM 9.4 9.0   GFR: Estimated Creatinine Clearance: 56.5 mL/min (by C-G formula based on SCr of 0.59 mg/dL). Liver Function Tests: No results for input(s): AST, ALT, ALKPHOS, BILITOT, PROT, ALBUMIN in the last 168 hours. No results for input(s): LIPASE, AMYLASE in the last 168 hours. No results for input(s): AMMONIA in the last 168 hours. Coagulation Profile: No results for input(s): INR, PROTIME in the last 168 hours. Cardiac Enzymes: No results for input(s): CKTOTAL, CKMB, CKMBINDEX, TROPONINI in the last 168 hours. BNP (last 3 results) No results for input(s): PROBNP in the last 8760 hours. HbA1C: No results for input(s): HGBA1C in the last 72 hours. CBG: No results for input(s): GLUCAP in the last 168 hours. Lipid Profile: No results for input(s): CHOL, HDL, LDLCALC, TRIG, CHOLHDL, LDLDIRECT in the last 72 hours. Thyroid Function Tests: No results for input(s): TSH, T4TOTAL, FREET4, T3FREE, THYROIDAB in the last 72 hours. Anemia Panel: No results for input(s): VITAMINB12, FOLATE, FERRITIN, TIBC, IRON, RETICCTPCT in the last 72 hours. Sepsis Labs: No results for input(s): PROCALCITON, LATICACIDVEN in the last 168 hours.  Recent Results (from the past 240 hour(s))  Culture, blood (Routine x 2)     Status: None   Collection Time: 05/06/18  9:57 PM  Result Value Ref Range Status   Specimen Description BLOOD RIGHT ANTECUBITAL  Final   Special Requests   Final     BOTTLES DRAWN AEROBIC AND ANAEROBIC Blood Culture results may not be optimal due to an inadequate volume of blood received in culture bottles   Culture   Final    NO GROWTH 5 DAYS Performed at [facility] [address] [place], [place] [address]    Report Status 05/11/2018 FINAL  Final  Culture, blood (Routine x 2)     Status: None   Collection Time: 05/06/18 10:05 PM  Result Value Ref Range Status   Specimen Description BLOOD RIGHT WRIST  Final   Special Requests   Final    BOTTLES DRAWN AEROBIC AND ANAEROBIC Blood Culture adequate volume   Culture   Final    NO GROWTH 5 DAYS Performed at [facility], [place] [address] [place], [place] [address]    Report Status 05/11/2018 FINAL  Final  Urine Culture     Status: None   Collection Time: 05/07/18  7:02 AM  Result Value Ref Range Status   Specimen Description URINE, RANDOM  Final   Special Requests NONE  Final   Culture   Final    NO GROWTH Performed at [facility], [address], [place], [place] [address]    Report Status 05/08/2018 FINAL  Final  MRSA PCR Screening     Status: None   Collection Time: 05/08/18  7:38 AM  Result Value Ref Range Status   MRSA by PCR NEGATIVE NEGATIVE Final    Comment:        The GeneXpert MRSA Assay (FDA approved for NASAL specimens only), is one component of a comprehensive MRSA colonization surveillance program. It is not intended to diagnose MRSA infection nor to guide or monitor treatment for MRSA infections. Performed at [facility], [place] [address], [place], [place] [address]   Culture, blood (routine x 2) Call MD if unable to obtain prior to antibiotics being given     Status: None (Preliminary result)   Collection Time: 05/12/18  8:45 PM  Result Value Ref Range Status   Specimen Description   Final    BLOOD LEFT HAND Performed at [facility] [address], [place], [place] [address]    Special Requests   Final    BOTTLES  DRAWN AEROBIC AND ANAEROBIC Blood Culture adequate volume Performed at [facility] [address], [place], [place] [address]    Culture   Final    NO GROWTH 3 DAYS Performed at [facility], [place] [address] [place], [place] [address]    Report Status PENDING  Incomplete  Culture, blood (routine x 2) Call MD if unable to obtain prior to antibiotics being given     Status: None (Preliminary result)   Collection Time: 05/12/18 11:16 PM  Result Value Ref Range Status   Specimen Description   Final    BLOOD LEFT ANTECUBITAL Performed at [facility] [address] [place], [place] [address]    Special Requests   Final    BOTTLES DRAWN AEROBIC AND ANAEROBIC Blood Culture adequate volume Performed at [facility] [address] [place], [place] [address]    Culture   Final    NO GROWTH 3 DAYS Performed at [facility], [place] [address], [place], [place] [address]    Report Status PENDING  Incomplete  Culture, sputum-assessment     Status: None   Collection Time: 05/13/18  7:31 AM  Result Value Ref Range Status   Specimen Description SPUTUM  Final   Special Requests NONE  Final   Sputum evaluation   Final    THIS SPECIMEN IS ACCEPTABLE FOR SPUTUM CULTURE Performed at [facility], [place] [address] [place], [place] [address]    Report Status 05/13/2018 FINAL  Final  Culture, respiratory     Status: None   Collection Time: 05/13/18  7:31 AM  Result Value Ref Range Status   Specimen Description   Final    SPUTUM Performed at [facility] [address], [place], [place] [address]    Special Requests   Final    NONE Reflexed from [id number] Performed at [facility], [place] [address] [place], [place] [address]    Gram Stain   Final    RARE WBC PRESENT,BOTH PMN AND MONONUCLEAR FEW GRAM POSITIVE COCCI IN PAIRS FEW GRAM NEGATIVE RODS    Culture   Final  Consistent with normal respiratory flora. Performed at [facility], [place] [address], [place], [place] [address]    Report Status 05/15/2018 FINAL  Final      Radiology Studies: Dg Chest Port 1 View  Result Date: 05/15/2018 CLINICAL DATA:  Shortness of breath EXAM: PORTABLE CHEST 1 VIEW COMPARISON:  05/12/2018 FINDINGS: Cardiac shadow is stable. Right pleural effusion is again noted with right basilar infiltrate. Small left pleural effusion is seen as well. No bony abnormality is noted. IMPRESSION: The overall appearance is similar to that seen on recent CT examination with persistent predominant right basilar changes identified. Electronically Signed   By: Inez Catalina M.D.   On: 05/15/2018 12:18      Scheduled Meds: . arformoterol  15 mcg Nebulization BID  . atorvastatin  10 mg Oral q morning - 10a  . budesonide (PULMICORT) nebulizer solution  0.5 mg Nebulization BID  . calcium-vitamin D  1 tablet Oral q morning - 10a  . docusate sodium  100 mg Oral QHS  . folic acid  1 mg Oral q morning - 10a  . irbesartan  150 mg Oral Daily   And  . hydrochlorothiazide  12.5 mg Oral Daily  . HYDROcodone-homatropine  5 mL Oral QHS,MR X 1  . ipratropium  0.5 mg Nebulization TID  . iron polysaccharides  150 mg Oral Daily  . levalbuterol  0.63 mg Nebulization TID  . loratadine  10 mg Oral QHS  . methylPREDNISolone (SOLU-MEDROL) injection  60 mg Intravenous Q12H  . multivitamin with minerals  1 tablet Oral q morning - 10a  . vitamin B-12  2,500 mcg Oral q morning - 10a   Continuous Infusions: . ceFEPime (MAXIPIME) IV Stopped (05/16/18 1215)     LOS: 4 days    Time spent: Total of 25 minutes spent with pt, greater than 50% of which was spent in discussion of  treatment, counseling and coordination of care   Chipper Oman, MD Pager: Text Page via [email or web address]   If 7PM-7AM, please contact night-coverage [email or web address] 05/16/2018, 3:12 PM   Note - This record has been created using  [employer]. Chart creation errors have been sought, but may not always have been located. Such creation errors do not reflect on the standard of medical care.

## 2018-05-17 LAB — CBC WITH DIFFERENTIAL/PLATELET
BASOS ABS: 0 10*3/uL (ref 0.0–0.1)
Basophils Relative: 0 %
EOS ABS: 0 10*3/uL (ref 0.0–0.7)
EOS PCT: 0 %
HEMATOCRIT: 30.1 % — AB (ref 36.0–46.0)
HEMOGLOBIN: 9.6 g/dL — AB (ref 12.0–15.0)
LYMPHS PCT: 7 %
Lymphs Abs: 0.6 10*3/uL — ABNORMAL LOW (ref 0.7–4.0)
MCH: 28.9 pg (ref 26.0–34.0)
MCHC: 31.9 g/dL (ref 30.0–36.0)
MCV: 90.7 fL (ref 78.0–100.0)
MONO ABS: 0.3 10*3/uL (ref 0.1–1.0)
Monocytes Relative: 3 %
Neutro Abs: 7.6 10*3/uL (ref 1.7–7.7)
Neutrophils Relative %: 90 %
Platelets: 306 10*3/uL (ref 150–400)
RBC: 3.32 MIL/uL — ABNORMAL LOW (ref 3.87–5.11)
RDW: 15.5 % (ref 11.5–15.5)
WBC: 8.4 10*3/uL (ref 4.0–10.5)

## 2018-05-17 LAB — BASIC METABOLIC PANEL
Anion gap: 11 (ref 5–15)
BUN: 24 mg/dL — AB (ref 8–23)
CALCIUM: 9.3 mg/dL (ref 8.9–10.3)
CHLORIDE: 106 mmol/L (ref 98–111)
CO2: 32 mmol/L (ref 22–32)
CREATININE: 0.56 mg/dL (ref 0.44–1.00)
GFR calc non Af Amer: >60 mL/min (ref >60)
GLUCOSE: 127 mg/dL — AB (ref 70–99)
Potassium: 4.5 mmol/L (ref 3.5–5.1)
Sodium: 149 mmol/L — ABNORMAL HIGH (ref 135–145)

## 2018-05-17 LAB — MAGNESIUM: Magnesium: 2.1 mg/dL (ref 1.7–2.4)

## 2018-05-17 NOTE — Progress Notes (Addendum)
PROGRESS NOTE [facility]   Casey Wilkinson   YIR:[id number] DOB: January 05, 1941  DOA: 05/12/2018 PCP: Shon Baton, MD   Brief Narrative:  Casey Wilkinson is a 77 year old female with medical history significant for COPD on 2 L oxygen at home, hypertension, discoid lupus and recently diagnosed with adenocarcinoma right lung.  Patient was recently discharged on 05/09/2018 after being admitted for pneumonia, underwent bronchoscopy and found to have adenocarcinoma of the right lung.  Patient was discharged on antibiotic therapy, however over the past 3 days she has become more short of breath especially with exertion and was dropping her saturations and decided to come to the emergency room.  Upon ED evaluation CT of the chest showed persistent right lower lobe collapse/infiltrate and small right pleural effusion patient was started on empiric antibiotic and received steroids.  She is admitted with working diagnosis of post obstructive pneumonia.  Subjective: Patient seen and examined, she is out of bed, up in chair.  Report to continues to have extreme shortness of breath and tachycardia with minimal activity.  Work with PT today however heart rate was up to the 130s and oxygen dropped to 80s.  Otherwise no complaints  Assessment & Plan: Acute on chronic respiratory failure with hypoxia due to postobstructive pneumonia/adenocarcinoma of the right lower lobe. Patient initially treated with cefepime and vancomycin for postobstructive/HCAP pneumonia. MRSA PCR negative, vancomycin was discontinued, currently on cefepime cefepime. Continue Solu-Medrol to 60 every 8 hrs. Continue nebulizer treatment with Brovana and Pulmicort.  Continue nebulizer treatment 3 times daily.  Case discussed with pulmonology, recommended to add echocardiogram to evaluate for heart issues otherwise continue current treatment.  Patient with poor reserve and likely this will be new baseline. Out of bed as tolerated, PT as  able.  COPD See above  Hypertension BP remains stable, continue current management.  Adenocarcinoma of right lung Patient has not see oncology yet, will need outpatient follow up after recover from this setback. No needs for consult Oncology at this time.  Will discuss oncology in a.m.  Anemia of chronic disease/Iron deficiency Patient was transfused 1 unit of PRBC, was also given 1 dose of IV iron. Hgb stable.  No signs of overt bleeding.    DVT prophylaxis: SCD's, no lovenox as patient was c/o hemoptysis upon admission  Code Status: Full Code Family Communication: Family at bedside  Disposition Plan: Home pending work-up and respiratory status improvement  Consultants:   None  Procedures:   None  Antimicrobials: Anti-infectives (From admission, onward)   Start     Dose/Rate Route Frequency Ordered Stop   05/13/18 2200  vancomycin (VANCOCIN) IVPB 1000 mg/200 mL premix  Status:  Discontinued     1,000 mg 200 mL/hr over 60 Minutes Intravenous Every 24 hours 05/12/18 2131 05/14/18 1354   05/13/18 1000  levofloxacin (LEVAQUIN) tablet 500 mg  Status:  Discontinued     500 mg Oral Daily 05/12/18 2037 05/12/18 2039   05/12/18 2200  ceFEPIme (MAXIPIME) 1 g in sodium chloride 0.9 % 100 mL IVPB     1 g 200 mL/hr over 30 Minutes Intravenous Every 12 hours 05/12/18 2126     05/12/18 2200  vancomycin (VANCOCIN) 1,500 mg in sodium chloride 0.9 % 500 mL IVPB     1,500 mg 250 mL/hr over 120 Minutes Intravenous  Once 05/12/18 2130 05/13/18 0204      Objective: Vitals:   05/17/18 0608 05/17/18 0720 05/17/18 1240 05/17/18 1252  BP: (!) 151/71  (!) 151/83  Pulse: 92  (!) 102   Resp: 17  16   Temp: 97.9 F (36.6 C)  98.3 F (36.8 C)   TempSrc: Oral  Oral   SpO2: 96% 96% 96% (!) 9%  Weight:      Height:        Intake/Output Summary (Last 24 hours) at 05/17/2018 1529 Last data filed at 05/17/2018 0272 Gross per 24 hour  Intake 100 ml  Output 900 ml  Net -800 ml   Filed  Weights   05/12/18 1722  Weight: 68.9 kg (152 lb)    Examination:  General: NAD  Cardiovascular: RRR, S1/S2 +, no rubs, no gallops Respiratory: Decrease breath sounds b/l some improvement from yesterday. Diffuse rhonchi.  Abdominal: Soft, NT, ND, bowel sounds + Extremities: no edema.   Data Reviewed: I have personally reviewed following labs and imaging studies  CBC: Recent Labs  Lab 05/12/18 1838 05/14/18 0423 05/15/18 0415 05/16/18 0410 05/17/18 0436  WBC 10.4 8.2 9.7 8.1 8.4  NEUTROABS 9.0*  --   --   --  7.6  HGB 8.1* 7.6* 7.7* 9.1* 9.6*  HCT 25.1* 23.7* 24.2* 29.0* 30.1*  MCV 91.6 91.5 92.4 91.8 90.7  PLT 258 290 301 284 536   Basic Metabolic Panel: Recent Labs  Lab 05/12/18 1838 05/16/18 0410 05/17/18 0436  NA 137 139 149*  K 4.2 4.5 4.5  CL 98 99 106  CO2 26 31 32  GLUCOSE 109* 132* 127*  BUN 15 22 24*  CREATININE 0.70 0.59 0.56  CALCIUM 9.4 9.0 9.3  MG  --   --  2.1   GFR: Estimated Creatinine Clearance: 56.5 mL/min (by C-G formula based on SCr of 0.56 mg/dL). Liver Function Tests: No results for input(s): AST, ALT, ALKPHOS, BILITOT, PROT, ALBUMIN in the last 168 hours. No results for input(s): LIPASE, AMYLASE in the last 168 hours. No results for input(s): AMMONIA in the last 168 hours. Coagulation Profile: No results for input(s): INR, PROTIME in the last 168 hours. Cardiac Enzymes: No results for input(s): CKTOTAL, CKMB, CKMBINDEX, TROPONINI in the last 168 hours. BNP (last 3 results) No results for input(s): PROBNP in the last 8760 hours. HbA1C: No results for input(s): HGBA1C in the last 72 hours. CBG: No results for input(s): GLUCAP in the last 168 hours. Lipid Profile: No results for input(s): CHOL, HDL, LDLCALC, TRIG, CHOLHDL, LDLDIRECT in the last 72 hours. Thyroid Function Tests: No results for input(s): TSH, T4TOTAL, FREET4, T3FREE, THYROIDAB in the last 72 hours. Anemia Panel: No results for input(s): VITAMINB12, FOLATE,  FERRITIN, TIBC, IRON, RETICCTPCT in the last 72 hours. Sepsis Labs: No results for input(s): PROCALCITON, LATICACIDVEN in the last 168 hours.  Recent Results (from the past 240 hour(s))  MRSA PCR Screening     Status: None   Collection Time: 05/08/18  7:38 AM  Result Value Ref Range Status   MRSA by PCR NEGATIVE NEGATIVE Final    Comment:        The GeneXpert MRSA Assay (FDA approved for NASAL specimens only), is one component of a comprehensive MRSA colonization surveillance program. It is not intended to diagnose MRSA infection nor to guide or monitor treatment for MRSA infections. Performed at [facility], [place] [address] [place],  [address]   Culture, blood (routine x 2) Call MD if unable to obtain prior to antibiotics being given     Status: None (Preliminary result)   Collection Time: 05/12/18  8:45 PM  Result Value Ref  Range Status   Specimen Description   Final    BLOOD LEFT HAND Performed at [facility] [address] [place], [place] [address]    Special Requests   Final    BOTTLES DRAWN AEROBIC AND ANAEROBIC Blood Culture adequate volume Performed at [facility] [address], [place], [place] [address]    Culture   Final    NO GROWTH 4 DAYS Performed at [facility], [place] [address] [place], [place] [address]    Report Status PENDING  Incomplete  Culture, blood (routine x 2) Call MD if unable to obtain prior to antibiotics being given     Status: None (Preliminary result)   Collection Time: 05/12/18 11:16 PM  Result Value Ref Range Status   Specimen Description   Final    BLOOD LEFT ANTECUBITAL Performed at [facility] [address], [place], [place] [address]    Special Requests   Final    BOTTLES DRAWN AEROBIC AND ANAEROBIC Blood Culture adequate volume Performed at [facility] [address], [place], [place] [address]    Culture   Final    NO  GROWTH 4 DAYS Performed at [facility], [place] [address], [place], [place] [address]    Report Status PENDING  Incomplete  Culture, sputum-assessment     Status: None   Collection Time: 05/13/18  7:31 AM  Result Value Ref Range Status   Specimen Description SPUTUM  Final   Special Requests NONE  Final   Sputum evaluation   Final    THIS SPECIMEN IS ACCEPTABLE FOR SPUTUM CULTURE Performed at [facility], [place] [address] [place], [place] [address]    Report Status 05/13/2018 FINAL  Final  Culture, respiratory     Status: None   Collection Time: 05/13/18  7:31 AM  Result Value Ref Range Status   Specimen Description   Final    SPUTUM Performed at [facility] [address], [place], [place] [address]    Special Requests   Final    NONE Reflexed from [id number] Performed at [facility], [place] [address], [place], [place] [address]    Gram Stain   Final    RARE WBC PRESENT,BOTH PMN AND MONONUCLEAR FEW GRAM POSITIVE COCCI IN PAIRS FEW GRAM NEGATIVE RODS    Culture   Final    Consistent with normal respiratory flora. Performed at [facility], [place] [address], [place],  [address]    Report Status 05/15/2018 FINAL  Final     Radiology Studies: No results found.   Scheduled Meds: . arformoterol  15 mcg Nebulization BID  . atorvastatin  10 mg Oral q morning - 10a  . budesonide (PULMICORT) nebulizer solution  0.5 mg Nebulization BID  . calcium-vitamin D  1 tablet Oral q morning - 10a  . docusate sodium  100 mg Oral QHS  . folic acid  1 mg Oral q morning - 10a  . irbesartan  150 mg Oral Daily   And  . hydrochlorothiazide  12.5 mg Oral Daily  . HYDROcodone-homatropine  5 mL Oral QHS,MR X 1  . hydrocortisone   Rectal BID  . ipratropium  0.5 mg Nebulization TID  . iron polysaccharides  150 mg Oral Daily  . levalbuterol  0.63 mg Nebulization TID  . loratadine  10 mg Oral QHS  . methylPREDNISolone  (SOLU-MEDROL) injection  60 mg Intravenous Q8H  .  multivitamin with minerals  1 tablet Oral q morning - 10a  . vitamin B-12  2,500 mcg Oral q morning - 10a   Continuous Infusions: . ceFEPime (MAXIPIME) IV 1 g (05/17/18 1111)     LOS: 5 days    Time spent: Total of 25 minutes spent with pt, greater than 50% of which was spent in discussion of  treatment, counseling and coordination of care   Chipper Oman, MD Pager: Text Page via [email or web address]   If 7PM-7AM, please contact night-coverage [email or web address] 05/17/2018, 3:29 PM   Note - This record has been created using [employer]. Chart creation errors have been sought, but may not always have been located. Such creation errors do not reflect on the standard of medical care.

## 2018-05-18 ENCOUNTER — Inpatient Hospital Stay (HOSPITAL_COMMUNITY): Payer: Medicare Other

## 2018-05-18 DIAGNOSIS — Z888 Allergy status to other drugs, medicaments and biological substances status: Secondary | ICD-10-CM

## 2018-05-18 DIAGNOSIS — C3431 Malignant neoplasm of lower lobe, right bronchus or lung: Secondary | ICD-10-CM

## 2018-05-18 DIAGNOSIS — I5030 Unspecified diastolic (congestive) heart failure: Secondary | ICD-10-CM

## 2018-05-18 DIAGNOSIS — Z87891 Personal history of nicotine dependence: Secondary | ICD-10-CM

## 2018-05-18 LAB — CULTURE, BLOOD (ROUTINE X 2)
CULTURE: NO GROWTH
CULTURE: NO GROWTH
Special Requests: ADEQUATE
Special Requests: ADEQUATE

## 2018-05-18 LAB — TYPE AND SCREEN
ABO/RH(D): O POS
ANTIBODY SCREEN: NEGATIVE
UNIT DIVISION: 0

## 2018-05-18 LAB — ECHOCARDIOGRAM COMPLETE
Height: 65 in
WEIGHTICAEL: 2432 [oz_av]

## 2018-05-18 LAB — BPAM RBC
Blood Product Expiration Date: 201908292359
ISSUE DATE / TIME: 201907262027
UNIT TYPE AND RH: 5100

## 2018-05-18 MED ORDER — IPRATROPIUM BROMIDE 0.02 % IN SOLN
0.5000 mg | RESPIRATORY_TRACT | Status: DC | PRN
  Administered 2018-05-18: 0.5 mg via RESPIRATORY_TRACT
  Filled 2018-05-18: qty 2.5

## 2018-05-18 MED ORDER — LEVALBUTEROL HCL 0.63 MG/3ML IN NEBU
0.6300 mg | INHALATION_SOLUTION | RESPIRATORY_TRACT | Status: DC | PRN
  Administered 2018-05-18: 0.63 mg via RESPIRATORY_TRACT
  Filled 2018-05-18: qty 3

## 2018-05-18 MED ORDER — IPRATROPIUM BROMIDE 0.02 % IN SOLN
0.5000 mg | Freq: Three times a day (TID) | RESPIRATORY_TRACT | Status: DC
  Administered 2018-05-19 – 2018-05-23 (×12): 0.5 mg via RESPIRATORY_TRACT
  Filled 2018-05-18 (×13): qty 2.5

## 2018-05-18 MED ORDER — LEVALBUTEROL HCL 0.63 MG/3ML IN NEBU
0.6300 mg | INHALATION_SOLUTION | Freq: Three times a day (TID) | RESPIRATORY_TRACT | Status: DC
  Administered 2018-05-19 – 2018-05-23 (×12): 0.63 mg via RESPIRATORY_TRACT
  Filled 2018-05-18 (×13): qty 3

## 2018-05-18 MED ORDER — IOHEXOL 300 MG/ML  SOLN
75.0000 mL | Freq: Once | INTRAMUSCULAR | Status: AC | PRN
  Administered 2018-05-18: 75 mL via INTRAVENOUS

## 2018-05-18 NOTE — Consult Note (Signed)
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 Shortness of Breath  : I have shortness of breath.  HPI: [name] is a very nice 77 year old white female.  She comes from the [place] area.  She is live there for over 60 years.  She has a history of colon cancer.  She had this removed back in September 2016.  From the pathology report, looks like this was a stage I or II.  She had 30 lymph nodes removed and all were negative.  She has a history of tobacco use.  She stopped 22 years ago.  She probably has about a 30-pack-year history of tobacco use.  She began to have problems early in July.  She was having some shortness of breath.  She is on oxygen at home.  She has underlying COPD.  I am not sure how about her underlying lung function is.  I do not think she is steroid dependent.  She had a CT scan done.  This is done on July 3.  The CT scan was thought to show a dense consolidation.  A 7 mm left lower lobe pulmonary nodule was noted.  There is no obvious pathologic adenopathy in the mediastinum.  She was discharged.  Unfortunate, she ended up back in the hospital.  She did have a bronchoscopy done.  This was done on 05/06/2018.  Dr. Lamonte Sakai, who is her pulmonologist, did this.  He did not see any endobronchial lesions.  He did transbronchial biopsies.  He also did a great job because 1 of the biopsies came back positive for adenocarcinoma  ([id number]).  Also some cytologies came back as positive for adenocarcinoma.  No elective studies were sent off as far as I can tell.  She had worsening shortness of breath.  She had a CT angiogram done on 23 July.  I think she may have had some boxes.  Again, she had right lower lobe collapse.  No mass lesion was noted.  There is no obvious mediastinal or hilar adenopathy.  We are asked to see her now to help with any management  decisions.  She had a CT of the brain which was negative.  She has CT of the abdomen pelvis which was also negative.  Her last mammogram was a year ago in September.  She does have some autoimmune problems.  She has lupus.  She also has psoriasis.  She has not been seen by surgery.  She has not been seen by radiation oncology.  Her appetite is doing okay.  She is eating okay.  There is no weight loss.  She is had no change in bowel or bladder habits.  Overall, I said her performance status is ECOG 1.       Past Medical History:  Diagnosis Date  . Anemia   . Arthritis    "all over"  . Carotid artery stenosis    right side followed by [facility]- 65%  . Colitis April 2016  . Colon cancer ([place])   . COPD (chronic obstructive pulmonary disease) ([place])   . Discoid lupus erythematosus   . Diverticulitis April 2016   and Colitis  . History of blood transfusion 01/2015, 06/2015   colectomy  . Hypertension   . Psoriatic arthritis ([place])   . Shortness of breath dyspnea    with exertion  . Squamous cell carcinoma, leg   :  Past Surgical  History:  Procedure Laterality Date  . APPENDECTOMY  06/2015  . CATARACT EXTRACTION W/ INTRAOCULAR LENS  IMPLANT, BILATERAL Bilateral 2017  . COLONOSCOPY  06/23/15  . COLONOSCOPY W/ POLYPECTOMY    . CYSTOCELE REPAIR  2006  . DILATION AND CURETTAGE OF UTERUS  X 2  . EXCISIONAL HEMORRHOIDECTOMY  2006  . JOINT REPLACEMENT    . LAPAROSCOPIC PARTIAL COLECTOMY N/A 06/23/2015   Procedure: LAPAROSCOPIC ILEOCECETOMY;  Surgeon: Excell Seltzer, MD;  Location: [facility];  Service: General;  Laterality: N/A;  . MOHS SURGERY Left    "basal; LLE"  . RECTOCELE REPAIR  2006  . SQUAMOUS CELL CARCINOMA EXCISION  "several"   "legs mostly"  . TOTAL KNEE ARTHROPLASTY Left 01/31/2016   Procedure: LEFT TOTAL KNEE ARTHROPLASTY;  Surgeon: Leandrew Koyanagi, MD;  Location: [facility];  Service: Orthopedics;  Laterality: Left;  Marland Kitchen VAGINAL HYSTERECTOMY  1968   "total"  . VIDEO  BRONCHOSCOPY Bilateral 05/06/2018   Procedure: VIDEO BRONCHOSCOPY WITH FLUORO;  Surgeon: Collene Gobble, MD;  Location: [facility];  Service: Cardiopulmonary;  Laterality: Bilateral;  :   Current Facility-Administered Medications:  .  acetaminophen (TYLENOL) tablet 650 mg, 650 mg, Oral, Q8H PRN, Etta Quill, DO, 650 mg at 05/14/18 0946 .  arformoterol (BROVANA) nebulizer solution 15 mcg, 15 mcg, Nebulization, BID, Patrecia Pour, Christean Grief, MD, 15 mcg at 05/18/18 [phone]  atorvastatin (LIPITOR) tablet 10 mg, 10 mg, Oral, q morning - 10a, Alcario Drought, Jared M, DO, 10 mg at 05/18/18 1003 .  budesonide (PULMICORT) nebulizer solution 0.5 mg, 0.5 mg, Nebulization, BID, Patrecia Pour, Christean Grief, MD, 0.5 mg at 05/18/18 0807 .  calcium-vitamin D (OSCAL WITH D) 500-200 MG-UNIT per tablet 1 tablet, 1 tablet, Oral, q morning - 10a, Alcario Drought, Jared M, DO, 1 tablet at 05/18/18 1004 .  ceFEPIme (MAXIPIME) 1 g in sodium chloride 0.9 % 100 mL IVPB, 1 g, Intravenous, Q12H, Luiz Ochoa, RPH, Stopped at 05/18/18 1032 .  docusate sodium (COLACE) capsule 100 mg, 100 mg, Oral, QHS, Gardner, Jared M, DO, 100 mg at 05/17/18 2239 .  folic acid (FOLVITE) tablet 1 mg, 1 mg, Oral, q morning - 10a, Alcario Drought, Jared M, DO, 1 mg at 05/18/18 1003 .  irbesartan (AVAPRO) tablet 150 mg, 150 mg, Oral, Daily, 150 mg at 05/18/18 1005 **AND** hydrochlorothiazide (MICROZIDE) capsule 12.5 mg, 12.5 mg, Oral, Daily, Alcario Drought, Jared M, DO, 12.5 mg at 05/18/18 1005 .  HYDROcodone-acetaminophen (NORCO/VICODIN) 5-325 MG per tablet 1 tablet, 1 tablet, Oral, Q6H PRN, Etta Quill, DO, 1 tablet at 05/18/18 0106 .  HYDROcodone-homatropine (HYCODAN) 5-1.5 MG/5ML syrup 5 mL, 5 mL, Oral, QHS,MR X 1, Gardner, Jared M, DO, 5 mL at 05/17/18 2242 .  hydrocortisone (ANUSOL-HC) 2.5 % rectal cream, , Rectal, BID, Patrecia Pour, Edwin, MD .  ipratropium (ATROVENT) nebulizer solution 0.5 mg, 0.5 mg, Nebulization, Q4H PRN, Patrecia Pour, Christean Grief, MD .  iron  polysaccharides (NIFEREX) capsule 150 mg, 150 mg, Oral, Daily, Alcario Drought, Jared M, DO, 150 mg at 05/18/18 1004 .  levalbuterol (XOPENEX) nebulizer solution 0.63 mg, 0.63 mg, Nebulization, Q4H PRN, Patrecia Pour, Christean Grief, MD .  loratadine (CLARITIN) tablet 10 mg, 10 mg, Oral, QHS, Gardner, Jared M, DO, 10 mg at 05/17/18 2239 .  methylPREDNISolone sodium succinate (SOLU-MEDROL) 125 mg/2 mL injection 60 mg, 60 mg, Intravenous, Q8H, Patrecia Pour, Christean Grief, MD, 60 mg at 05/18/18 1004 .  multivitamin with minerals tablet 1 tablet, 1 tablet, Oral, q morning - 10a, Alcario Drought, Jared M, DO, 1 tablet  at 05/18/18 1004 .  polyethylene glycol (MIRALAX / GLYCOLAX) packet 17 g, 17 g, Oral, Daily PRN, Alcario Drought, Jared M, DO .  vitamin B-12 (CYANOCOBALAMIN) tablet 2,500 mcg, 2,500 mcg, Oral, q morning - 10a, Etta Quill, DO, 2,500 mcg at 05/18/18 1004:  . arformoterol  15 mcg Nebulization BID  . atorvastatin  10 mg Oral q morning - 10a  . budesonide (PULMICORT) nebulizer solution  0.5 mg Nebulization BID  . calcium-vitamin D  1 tablet Oral q morning - 10a  . docusate sodium  100 mg Oral QHS  . folic acid  1 mg Oral q morning - 10a  . irbesartan  150 mg Oral Daily   And  . hydrochlorothiazide  12.5 mg Oral Daily  . HYDROcodone-homatropine  5 mL Oral QHS,MR X 1  . hydrocortisone   Rectal BID  . iron polysaccharides  150 mg Oral Daily  . loratadine  10 mg Oral QHS  . methylPREDNISolone (SOLU-MEDROL) injection  60 mg Intravenous Q8H  . multivitamin with minerals  1 tablet Oral q morning - 10a  . vitamin B-12  2,500 mcg Oral q morning - 10a  :  Allergies  Allergen Reactions  . Prochlorperazine Edisylate Nausea Only and Other (See Comments)    **COMPAZINE**   Stroke-like symptoms  . Sulfamethoxazole Nausea Only and Other (See Comments)    Pt taking Methotrexate, Sulfur drugs could cause SEVERE fatal reaction.  . Doxycycline Hives  :  Family History  Problem Relation Age of Onset  . Heart disease Father         Before age 9  . Hyperlipidemia Father   . Hypertension Father   . Pneumonia Mother   . Alzheimer's disease Mother   . Colon cancer Neg Hx   :  Social History   Socioeconomic History  . Marital status: Married    Spouse name: Not on file  . Number of children: 2  . Years of education: Not on file  . Highest education level: Not on file  Occupational History  . Occupation: retired    Comment: Secretary/receptionist x17yrs  Social Needs  . Financial resource strain: Not on file  . Food insecurity:    Worry: Not on file    Inability: Not on file  . Transportation needs:    Medical: Not on file    Non-medical: Not on file  Tobacco Use  . Smoking status: Former Smoker    Packs/day: 0.50    Years: 50.00    Pack years: 25.00    Types: Cigarettes    Last attempt to quit: 12/05/2000    Years since quitting: 17.4  . Smokeless tobacco: Never Used  . Tobacco comment: started smoking in early 20s  Substance and Sexual Activity  . Alcohol use: No    Alcohol/week: 0.0 oz  . Drug use: No  . Sexual activity: Yes  Lifestyle  . Physical activity:    Days per week: Not on file    Minutes per session: Not on file  . Stress: Not on file  Relationships  . Social connections:    Talks on phone: Not on file    Gets together: Not on file    Attends religious service: Not on file    Active member of club or organization: Not on file    Attends meetings of clubs or organizations: Not on file    Relationship status: Not on file  . Intimate partner violence:    Fear of current or ex  partner: Not on file    Emotionally abused: Not on file    Physically abused: Not on file    Forced sexual activity: Not on file  Other Topics Concern  . Not on file  Social History Narrative  . Not on file  :  Pertinent items are noted in HPI.  Exam: See above Patient Vitals for the past 24 hrs:  BP Temp Temp src Pulse Resp SpO2  05/18/18 1347 (!) 156/80 97.7 F (36.5 C) Oral 95 20 94 %   05/18/18 0807 - - - - - 92 %  05/18/18 0502 (!) 159/79 98 F (36.7 C) Oral 99 17 97 %  05/17/18 2103 136/80 97.8 F (36.6 C) Oral (!) 105 17 93 %  05/17/18 2041 - - - - - 98 %  05/17/18 2035 - - - - - 98 %  05/17/18 2027 - - - - - 94 %     Recent Labs    05/16/18 0410 05/17/18 0436  WBC 8.1 8.4  HGB 9.1* 9.6*  HCT 29.0* 30.1*  PLT 284 306   Recent Labs    05/16/18 0410 05/17/18 0436  NA 139 149*  K 4.5 4.5  CL 99 106  CO2 31 32  GLUCOSE 132* 127*  BUN 22 24*  CREATININE 0.59 0.56  CALCIUM 9.0 9.3    Blood smear review: None  Pathology: See above    Assessment and Plan: [name] is a very nice 77 year old white female.  She has adenocarcinoma of the right lower lung.  It is really hard to see where this is.  She is an inpatient so we will not be able to get a PET scan on her.  I would not think that she would be a surgical candidate.  When I can tell, looks like she has at least stage II disease.  I really would like to avoid chemotherapy.  I would like to think that radiation therapy, in particular, stereotactic radiosurgery, might be a way to go.  However, it might be hard for radiation to be done since there is no obvious focal tumor to radiate.  I would not think that this is lymphangitic spread.  However, this is always a possibility.  We clearly need to get genetic markers.  Hopefully, there will be enough material to send off for genetic mutations that we might be able to target.  Given that she smoked, I am not sure that we will be able to find that.  Again, I think a PET scan would help out but this cannot be done with her as an inpatient.  I certainly would get radiation oncology involved.  She is quite symptomatic with limited movement.  May be she does have lymphangitic tumor spread that we does cannot see.  I spent about 45 minutes with she and her family.  They are all very nice.  They have a very strong faith.  It was nice to talk about our  faith.  We will follow along and try to help out in any way possible.  Lattie Haw, MD  2 Timothy 2:15

## 2018-05-18 NOTE — Progress Notes (Signed)
PROGRESS NOTE [facility]   Casey Wilkinson   HUT:[id number] DOB: 10/05/1941  DOA: 05/12/2018 PCP: Shon Baton, MD   Brief Narrative:  Casey Wilkinson is a 77 year old female with medical history significant for COPD on 2 L oxygen at home, hypertension, discoid lupus and recently diagnosed with adenocarcinoma right lung.  Patient was recently discharged on 05/09/2018 after being admitted for pneumonia, underwent bronchoscopy and found to have adenocarcinoma of the right lung.  Patient was discharged on antibiotic therapy, however over the past 3 days she has become more short of breath especially with exertion and was dropping her saturations and decided to come to the emergency room.  Upon ED evaluation CT of the chest showed persistent right lower lobe collapse/infiltrate and small right pleural effusion patient was started on empiric antibiotic and received steroids.  She is admitted with working diagnosis of post obstructive pneumonia.  Subjective: Patient seen and examined with multiple family members at bedside.  She is doing much better today, however continues to be symptomatic with minimal movement.  While at rest her oxygen is at baseline.  Assessment & Plan: Acute on chronic respiratory failure with hypoxia due to postobstructive pneumonia/adenocarcinoma of the right lower lobe. Patient initially treated with cefepime and vancomycin for postobstructive/HCAP pneumonia. MRSA PCR negative, vancomycin was discontinued, currently on cefepime and Solu-Medrol to 60 every 8 hrs. will begin taper to transition to prednisone. Continue nebulizer treatment with Brovana and Pulmicort twice daily.  Duo nebs from scheduled to as needed.  Echocardiogram does not show any source of cardiac issues.  60 to 65%. Patient with poor reserve and likely this will be new baseline. Out of bed as tolerated, PT as able.  COPD See above  Hypertension BP remains stable, continue current  management.  Adenocarcinoma of right lung Initially was thought to be only infectious process, therefor oncology was not consulted, however patient continues to be very symptomatic with minimal exertion.  Case was discussed with oncology who will see patient on consult and make some recommendations for possible radiation therapy.  Will obtain CT of the head, abdomen and pelvis for staging.  Anemia of chronic disease/Iron deficiency She was transfused 1 unit of PRBC during hospital stay, 1 dose of IV iron also was given hemoglobin remains stable.   DVT prophylaxis: SCD's, no lovenox as patient was c/o hemoptysis upon admission  Code Status: Full Code Family Communication: Family at bedside  Disposition Plan: Home pending work-up and respiratory status improvement  Consultants:   None  Procedures:   None  Antimicrobials: Anti-infectives (From admission, onward)   Start     Dose/Rate Route Frequency Ordered Stop   05/13/18 2200  vancomycin (VANCOCIN) IVPB 1000 mg/200 mL premix  Status:  Discontinued     1,000 mg 200 mL/hr over 60 Minutes Intravenous Every 24 hours 05/12/18 2131 05/14/18 1354   05/13/18 1000  levofloxacin (LEVAQUIN) tablet 500 mg  Status:  Discontinued     500 mg Oral Daily 05/12/18 2037 05/12/18 2039   05/12/18 2200  ceFEPIme (MAXIPIME) 1 g in sodium chloride 0.9 % 100 mL IVPB     1 g 200 mL/hr over 30 Minutes Intravenous Every 12 hours 05/12/18 2126     05/12/18 2200  vancomycin (VANCOCIN) 1,500 mg in sodium chloride 0.9 % 500 mL IVPB     1,500 mg 250 mL/hr over 120 Minutes Intravenous  Once 05/12/18 2130 05/13/18 0204      Objective: Vitals:   05/17/18 2041 05/17/18 2103 05/18/18  0502 05/18/18 0807  BP:  136/80 (!) 159/79   Pulse:  (!) 105 99   Resp:  17 17   Temp:  97.8 F (36.6 C) 98 F (36.7 C)   TempSrc:  Oral Oral   SpO2: 98% 93% 97% 92%  Weight:      Height:        Intake/Output Summary (Last 24 hours) at 05/18/2018 1239 Last data filed at  05/18/2018 0946 Gross per 24 hour  Intake 240 ml  Output 1800 ml  Net -1560 ml   Filed Weights   05/12/18 1722  Weight: 68.9 kg (152 lb)    Examination:  General: NAD, On 2L [place]  Cardiovascular: RRR, S1/S2 + Respiratory: Decrease BS bilaterally, diffuse rhonchi and mild expiratory wheezing  Abdominal: Soft, NT, ND, bowel sounds + Extremities: no edema  Data Reviewed: I have personally reviewed following labs and imaging studies  CBC: Recent Labs  Lab 05/12/18 1838 05/14/18 0423 05/15/18 0415 05/16/18 0410 05/17/18 0436  WBC 10.4 8.2 9.7 8.1 8.4  NEUTROABS 9.0*  --   --   --  7.6  HGB 8.1* 7.6* 7.7* 9.1* 9.6*  HCT 25.1* 23.7* 24.2* 29.0* 30.1*  MCV 91.6 91.5 92.4 91.8 90.7  PLT 258 290 301 284 536   Basic Metabolic Panel: Recent Labs  Lab 05/12/18 1838 05/16/18 0410 05/17/18 0436  NA 137 139 149*  K 4.2 4.5 4.5  CL 98 99 106  CO2 26 31 32  GLUCOSE 109* 132* 127*  BUN 15 22 24*  CREATININE 0.70 0.59 0.56  CALCIUM 9.4 9.0 9.3  MG  --   --  2.1   GFR: Estimated Creatinine Clearance: 56.5 mL/min (by C-G formula based on SCr of 0.56 mg/dL). Liver Function Tests: No results for input(s): AST, ALT, ALKPHOS, BILITOT, PROT, ALBUMIN in the last 168 hours. No results for input(s): LIPASE, AMYLASE in the last 168 hours. No results for input(s): AMMONIA in the last 168 hours. Coagulation Profile: No results for input(s): INR, PROTIME in the last 168 hours. Cardiac Enzymes: No results for input(s): CKTOTAL, CKMB, CKMBINDEX, TROPONINI in the last 168 hours. BNP (last 3 results) No results for input(s): PROBNP in the last 8760 hours. HbA1C: No results for input(s): HGBA1C in the last 72 hours. CBG: No results for input(s): GLUCAP in the last 168 hours. Lipid Profile: No results for input(s): CHOL, HDL, LDLCALC, TRIG, CHOLHDL, LDLDIRECT in the last 72 hours. Thyroid Function Tests: No results for input(s): TSH, T4TOTAL, FREET4, T3FREE, THYROIDAB in the last 72  hours. Anemia Panel: No results for input(s): VITAMINB12, FOLATE, FERRITIN, TIBC, IRON, RETICCTPCT in the last 72 hours. Sepsis Labs: No results for input(s): PROCALCITON, LATICACIDVEN in the last 168 hours.  Recent Results (from the past 240 hour(s))  Culture, blood (routine x 2) Call MD if unable to obtain prior to antibiotics being given     Status: None (Preliminary result)   Collection Time: 05/12/18  8:45 PM  Result Value Ref Range Status   Specimen Description   Final    BLOOD LEFT HAND Performed at [facility] [address] [place], [place] [address]    Special Requests   Final    BOTTLES DRAWN AEROBIC AND ANAEROBIC Blood Culture adequate volume Performed at [facility] [address] [place], [place] [address]    Culture   Final    NO GROWTH 4 DAYS Performed at [facility], [place] [address], [place], [place]  [address]    Report Status PENDING  Incomplete  Culture, blood (routine x 2) Call MD if unable to obtain prior to antibiotics being given     Status: None (Preliminary result)   Collection Time: 05/12/18 11:16 PM  Result Value Ref Range Status   Specimen Description   Final    BLOOD LEFT ANTECUBITAL Performed at [facility] [address], [place], [place] [address]    Special Requests   Final    BOTTLES DRAWN AEROBIC AND ANAEROBIC Blood Culture adequate volume Performed at [facility] [address], [place], [place] [address]    Culture   Final    NO GROWTH 4 DAYS Performed at [facility], [place] [address], [place], [place] [address]    Report Status PENDING  Incomplete  Culture, sputum-assessment     Status: None   Collection Time: 05/13/18  7:31 AM  Result Value Ref Range Status   Specimen Description SPUTUM  Final   Special Requests NONE  Final   Sputum evaluation   Final    THIS SPECIMEN IS ACCEPTABLE FOR SPUTUM CULTURE Performed at [facility], [place] [address], [place], [place] [address]    Report Status 05/13/2018 FINAL  Final  Culture, respiratory     Status: None   Collection Time: 05/13/18  7:31 AM  Result Value Ref Range Status   Specimen Description   Final    SPUTUM Performed at [facility] [address], [place],  [address]    Special Requests   Final    NONE Reflexed from [id number] Performed at [facility], [place] [address] [place], [place] [address]    Gram Stain   Final    RARE WBC PRESENT,BOTH PMN AND MONONUCLEAR FEW GRAM POSITIVE COCCI IN PAIRS FEW GRAM NEGATIVE RODS    Culture   Final    Consistent with normal respiratory flora. Performed at [facility], [place] [address] [place],  [address]    Report Status 05/15/2018 FINAL  Final     Radiology Studies: No results found.   Scheduled Meds: . arformoterol  15 mcg Nebulization BID  . atorvastatin  10 mg Oral q morning - 10a  . budesonide (PULMICORT) nebulizer solution  0.5 mg Nebulization BID  . calcium-vitamin D  1 tablet Oral q morning - 10a  . docusate sodium  100 mg Oral QHS  . folic acid  1 mg Oral q morning - 10a  . irbesartan  150 mg Oral Daily   And  . hydrochlorothiazide  12.5 mg Oral Daily  . HYDROcodone-homatropine  5 mL Oral QHS,MR X 1  . hydrocortisone   Rectal BID  . iron polysaccharides  150 mg Oral Daily  . loratadine  10 mg Oral QHS  . methylPREDNISolone (SOLU-MEDROL) injection  60 mg Intravenous Q8H  . multivitamin with minerals  1 tablet Oral q morning - 10a  . vitamin B-12  2,500 mcg Oral q morning - 10a   Continuous Infusions: . ceFEPime (MAXIPIME) IV Stopped (05/18/18 1032)     LOS: 6 days    Time spent: Total of 25 minutes spent with pt, greater than 50% of which was spent in discussion of  treatment, counseling and coordination of care   Chipper Oman, MD Pager: Text Page via [email or web address]   If 7PM-7AM, please contact  night-coverage [email or web address] 05/18/2018, 12:39 PM   Note - This record has been created using [employer]  software. Chart creation errors have been sought, but may not always have been located. Such creation errors do not reflect on the standard of medical care.

## 2018-05-18 NOTE — Plan of Care (Signed)
  Problem: Elimination: Goal: Will not experience complications related to bowel motility Outcome: Progressing Goal: Will not experience complications related to urinary retention Outcome: Progressing   

## 2018-05-18 NOTE — Progress Notes (Signed)
Pharmacy Antibiotic Note  Casey Wilkinson is a 77 y.o. female admitted on 05/12/2018 with pneumonia. Patient recently in hospital 05/06/2018-05/09/2018 for persistent RLL infiltrate, treated with Vancomycin and Cefepime, then discharged home on Levofloxacin to complete course. Patient having worsening SOB and DOE, so presented to [facility] ED. Pharmacy was consulted for Vancomycin and Cefepime dosing on admission.  Plan: Day 6 full Abxs for post-obstructive PNA  Continue Cefepime 1g q8   Monitor renal function, cultures, clinical course.   Height: _0  (165.1 cm) Weight: 152 lb (68.9 kg) IBW/kg (Calculated) : 57  Temp (24hrs), Avg:98 F (36.7 C), Min:97.8 F (36.6 C), Max:98.3 F (36.8 C)  Recent Labs  Lab 05/12/18 1838 05/14/18 0423 05/15/18 0415 05/16/18 0410 05/17/18 0436  WBC 10.4 8.2 9.7 8.1 8.4  CREATININE 0.70  --   --  0.59 0.56    Estimated Creatinine Clearance: 56.5 mL/min (by C-G formula based on SCr of 0.56 mg/dL).    Allergies  Allergen Reactions  . Prochlorperazine Edisylate Nausea Only and Other (See Comments)    **COMPAZINE**   Stroke-like symptoms  . Sulfamethoxazole Nausea Only and Other (See Comments)    Pt taking Methotrexate, Sulfur drugs could cause SEVERE fatal reaction.  . Doxycycline Hives    Antimicrobials this admission: PTA Levofloxacin  7/23 Vancomycin >> 7/25 7/23 Cefepime >>  Dose adjustments this admission: 7/26: changed cefepime from 1g q12 to 1g q8  Microbiology results: 7/23 BCx: ngtd 7/24 Sputum: normal flora-final 7/19 MRSA PCR: negative **last BAL Cx 7/17 = normal flora**  Thank you for allowing pharmacy to be a part of this patient's care.  Minda Ditto PharmD Pager [phone] 05/18/2018, 12:40 PM

## 2018-05-18 NOTE — Progress Notes (Signed)
PT Cancellation Note  Patient Details Name: Casey Wilkinson MRN: [id number] DOB: [birth year]   Cancelled Treatment:    Reason Eval/Treat Not Completed: Patient at procedure or test/unavailable Attempted to work with patient this afternoon, however she is currently at a imaging study and is unavailable. Will attempt to try back later in day if time/schedule allow.    Deniece Ree PT, DPT, CBIS  Supplemental Physical Therapist [facility]   Pager [phone]

## 2018-05-18 NOTE — Progress Notes (Signed)
  Echocardiogram 2D Echocardiogram has been performed.  [name] 05/18/2018, 10:04 AM

## 2018-05-18 NOTE — Progress Notes (Signed)
Physical Therapy Treatment Patient Details Name: Casey Wilkinson MRN: [id number] DOB: 10-24-1940 Today's Date: 05/18/2018    History of Present Illness Pt was recently here last week and returned with Acute on chronic respiratory failure with hypoxia due to postobstructive pneumonia/adenocarcinoma of the right lower lobe.    PT Comments    Patient received up in chair, pleasant and willing to participate in light PT session; of note she saturates at 90-92% on 2LPM O2, however HR was found to be 119BPM (approximately 83% age predicted HRmax) at rest. Performed light seated exercises with O2 remaining 90% or above, and HR fluctuating between 112-124BPM (78-87% age predicted HRmax). Did not attempt to progress further exercise or mobility today due to considerably high resting HR/low HR reserve today. Patient fatigued after simple seated exercise. She was left sitting up in the chair with all needs met, family present this afternoon.    Follow Up Recommendations  Home health PT;Supervision for mobility/OOB     Equipment Recommendations       Recommendations for Other Services       Precautions / Restrictions Precautions Precaution Comments: watch HR/O2  Restrictions Weight Bearing Restrictions: No    Mobility  Bed Mobility               General bed mobility comments: DNT, received up in sitting   Transfers                 General transfer comment: DNT, HR 119 at rest so focused on seated exercise   Ambulation/Gait             General Gait Details: DNT, HR 119 at rest so focused on seated exercises    Stairs             Wheelchair Mobility    Modified Rankin (Stroke Patients Only)       Balance Overall balance assessment: Needs assistance Sitting-balance support: No upper extremity supported;Feet supported Sitting balance-Leahy Scale: Good     Standing balance support: During functional activity;Bilateral upper extremity  supported Standing balance-Leahy Scale: Good                              Cognition Arousal/Alertness: Awake/alert Behavior During Therapy: WFL for tasks assessed/performed Overall Cognitive Status: Within Functional Limits for tasks assessed                                        Exercises General Exercises - Lower Extremity Ankle Circles/Pumps: Both;20 reps;Seated Long Arc Quad: Both;10 reps;Seated(manual resistance from PT ) Hip ABduction/ADduction: Both;Seated;10 reps(manual resistance from PT ) Hip Flexion/Marching: Both;10 reps;Seated    General Comments General comments (skin integrity, edema, etc.): SpO2 remained 90-92% on 2LPM, HR varied between 112-124BPM during this session, patient states she is fatigued today       Pertinent Vitals/Pain Pain Assessment: 0-10 Pain Score: 5  Pain Location: R side  Pain Descriptors / Indicators: Aching;Sore Pain Intervention(s): Limited activity within patient's tolerance;Monitored during session    Home Living                      Prior Function            PT Goals (current goals can now be found in the care plan section) Acute Rehab PT Goals Patient Stated Goal: "to be  able to breathe better"  PT Goal Formulation: With patient Time For Goal Achievement: 05/30/18 Potential to Achieve Goals: Good Progress towards PT goals: Not progressing toward goals - comment(limited by fatigue and HR this session )    Frequency    Min 3X/week      PT Plan Current plan remains appropriate    Co-evaluation              AM-PAC PT "6 Clicks" Daily Activity  Outcome Measure  Difficulty turning over in bed (including adjusting bedclothes, sheets and blankets)?: None Difficulty moving from lying on back to sitting on the side of the bed? : None Difficulty sitting down on and standing up from a chair with arms (e.g., wheelchair, bedside commode, etc,.)?: Unable Help needed moving to and from  a bed to chair (including a wheelchair)?: A Little Help needed walking in hospital room?: Total Help needed climbing 3-5 steps with a railing? : Total 6 Click Score: 14    End of Session Equipment Utilized During Treatment: Oxygen Activity Tolerance: Patient limited by fatigue Patient left: in chair;with call bell/phone within reach;with family/visitor present   PT Visit Diagnosis: Other abnormalities of gait and mobility (R26.89);Difficulty in walking, not elsewhere classified (R26.2)     Time: 1281-1886 PT Time Calculation (min) (ACUTE ONLY): 10 min  Charges:  $Therapeutic Exercise: 8-22 mins                     Deniece Ree PT, DPT, CBIS  Supplemental Physical Therapist [place]   Pager [phone]

## 2018-05-19 ENCOUNTER — Encounter: Payer: Self-pay | Admitting: *Deleted

## 2018-05-19 ENCOUNTER — Ambulatory Visit
Admit: 2018-05-19 | Discharge: 2018-05-19 | Disposition: A | Discharge status: Home / Self Care | Payer: Medicare Other | Attending: Radiation Oncology | Admitting: Radiation Oncology

## 2018-05-19 DIAGNOSIS — C3491 Malignant neoplasm of unspecified part of right bronchus or lung: Secondary | ICD-10-CM

## 2018-05-19 LAB — CBC WITH DIFFERENTIAL/PLATELET
BASOS PCT: 0 %
Basophils Absolute: 0 10*3/uL (ref 0.0–0.1)
EOS ABS: 0 10*3/uL (ref 0.0–0.7)
Eosinophils Relative: 0 %
HCT: 31.7 % — ABNORMAL LOW (ref 36.0–46.0)
Hemoglobin: 10 g/dL — ABNORMAL LOW (ref 12.0–15.0)
Lymphocytes Relative: 4 %
Lymphs Abs: 0.5 10*3/uL — ABNORMAL LOW (ref 0.7–4.0)
MCH: 29.1 pg (ref 26.0–34.0)
MCHC: 31.5 g/dL (ref 30.0–36.0)
MCV: 92.2 fL (ref 78.0–100.0)
MONO ABS: 0.5 10*3/uL (ref 0.1–1.0)
MONOS PCT: 4 %
NEUTROS PCT: 92 %
Neutro Abs: 11.7 10*3/uL — ABNORMAL HIGH (ref 1.7–7.7)
Platelets: 325 10*3/uL (ref 150–400)
RBC: 3.44 MIL/uL — ABNORMAL LOW (ref 3.87–5.11)
RDW: 15.9 % — AB (ref 11.5–15.5)
WBC: 12.8 10*3/uL — ABNORMAL HIGH (ref 4.0–10.5)

## 2018-05-19 MED ORDER — SODIUM CHLORIDE 0.9 % IV SOLN
INTRAVENOUS | Status: DC | PRN
  Administered 2018-05-19 (×2): 500 mL via INTRAVENOUS

## 2018-05-19 MED ORDER — IPRATROPIUM BROMIDE 0.02 % IN SOLN: 0.5000 mg | RESPIRATORY_TRACT | Status: DC | PRN

## 2018-05-19 MED ORDER — LEVOFLOXACIN 500 MG PO TABS
500.0000 mg | ORAL_TABLET | Freq: Every day | ORAL | Status: AC
  Administered 2018-05-20 – 2018-05-22 (×3): 500 mg via ORAL
  Filled 2018-05-19 (×3): qty 1

## 2018-05-19 MED ORDER — LEVALBUTEROL HCL 0.63 MG/3ML IN NEBU: 0.6300 mg | INHALATION_SOLUTION | RESPIRATORY_TRACT | Status: DC | PRN

## 2018-05-19 MED ORDER — METHYLPREDNISOLONE SODIUM SUCC 125 MG IJ SOLR
60.0000 mg | Freq: Two times a day (BID) | INTRAMUSCULAR | Status: DC
  Administered 2018-05-19 – 2018-05-20 (×2): 60 mg via INTRAVENOUS
  Filled 2018-05-19 (×2): qty 2

## 2018-05-19 NOTE — Progress Notes (Signed)
PROGRESS NOTE [facility]   [name]   CWC:[id number] DOB: 04-04-1941  DOA: 05/12/2018 PCP: Shon Baton, MD   Brief Narrative:  Casey Wilkinson is a 77 year old female with medical history significant for COPD on 2 L oxygen at home, hypertension, discoid lupus and recently diagnosed with adenocarcinoma right lung.  Patient was recently discharged on 05/09/2018 after being admitted for pneumonia, underwent bronchoscopy and found to have adenocarcinoma of the right lung.  Patient was discharged on antibiotic therapy, however over the past 3 days she has become more short of breath especially with exertion and was dropping her saturations and decided to come to the emergency room.  Upon ED evaluation CT of the chest showed persistent right lower lobe collapse/infiltrate and small right pleural effusion patient was started on empiric antibiotic and received steroids.  She is admitted with working diagnosis of post obstructive pneumonia.  Subjective: Patient seen and examined, she continues to slowly improve.,  Abdomen and pelvis with no signs of metastasis.  Assessment & Plan: Acute on chronic respiratory failure with hypoxia due to postobstructive pneumonia/adenocarcinoma of the right lower lobe. Very slow improvement, she was started on cefepime and vancomycin for postobstructive/HCAP PNA, currently on cefepime, will switch to Levaquin.  Started on Solu-Medrol, currently tapering down possibly can transition to prednisone in a.m.  Continue Brovana and Pulmicort twice daily, DuoNeb's as needed.  Echocardiogram was done with no signs of heart failure, ejection fraction 60 to 65%.  Patient with poor lung reserve, likely this would be new baseline.  Continue PT and out of bed as tolerated.  Continue oxygen supplementation to keep O2 sats above 90%  COPD See above  Hypertension BP remains stable, continue current management.  Adenocarcinoma of right lung Initially was thought to be  only infectious process, therefor oncology was not consulted, however patient continues to be very symptomatic with minimal exertion.  Oncology was consulted, recommending radiation oncology. CT head, A/P shows no signs of mets. Discussed with Dr Tammi Klippel   Anemia of chronic disease/Iron deficiency She has been transfused 1 unit of PRBC during hospital stay, 1 dose of IV iron also was given. Hgb remains stable.    DVT prophylaxis: SCD's, no lovenox as patient was c/o hemoptysis upon admission  Code Status: Full Code Family Communication: Family at bedside  Disposition Plan: Home pending oncology   Consultants:   None  Procedures:   None  Antimicrobials: Anti-infectives (From admission, onward)   Start     Dose/Rate Route Frequency Ordered Stop   05/13/18 2200  vancomycin (VANCOCIN) IVPB 1000 mg/200 mL premix  Status:  Discontinued     1,000 mg 200 mL/hr over 60 Minutes Intravenous Every 24 hours 05/12/18 2131 05/14/18 1354   05/13/18 1000  levofloxacin (LEVAQUIN) tablet 500 mg  Status:  Discontinued     500 mg Oral Daily 05/12/18 2037 05/12/18 2039   05/12/18 2200  ceFEPIme (MAXIPIME) 1 g in sodium chloride 0.9 % 100 mL IVPB     1 g 200 mL/hr over 30 Minutes Intravenous Every 12 hours 05/12/18 2126     05/12/18 2200  vancomycin (VANCOCIN) 1,500 mg in sodium chloride 0.9 % 500 mL IVPB     1,500 mg 250 mL/hr over 120 Minutes Intravenous  Once 05/12/18 2130 05/13/18 0204      Objective: Vitals:   05/18/18 2034 05/18/18 2203 05/19/18 0502 05/19/18 0802  BP:  (!) 148/63 (!) 152/78   Pulse:  96 79   Resp:  20 20  Temp:  (!) 97.4 F (36.3 C) 98.1 F (36.7 C)   TempSrc:  Oral Oral   SpO2: 96% 100% 96% 95%  Weight:      Height:        Intake/Output Summary (Last 24 hours) at 05/19/2018 1355 Last data filed at 05/19/2018 0836 Gross per 24 hour  Intake 960 ml  Output 1000 ml  Net -40 ml   Filed Weights   05/12/18 1722  Weight: 68.9 kg (152 lb)     Examination:  General: NAD, on 2LNC  Cardiovascular: RRR, S1/S2 + Respiratory: Moving air better, diffuse rhonchi improving. Mild expiratory wheezing  Abdominal: Soft, NT, ND Extremities: no edema  Data Reviewed: I have personally reviewed following labs and imaging studies  CBC: Recent Labs  Lab 05/12/18 1838 05/14/18 0423 05/15/18 0415 05/16/18 0410 05/17/18 0436 05/19/18 0430  WBC 10.4 8.2 9.7 8.1 8.4 12.8*  NEUTROABS 9.0*  --   --   --  7.6 11.7*  HGB 8.1* 7.6* 7.7* 9.1* 9.6* 10.0*  HCT 25.1* 23.7* 24.2* 29.0* 30.1* 31.7*  MCV 91.6 91.5 92.4 91.8 90.7 92.2  PLT 258 290 301 284 306 626   Basic Metabolic Panel: Recent Labs  Lab 05/12/18 1838 05/16/18 0410 05/17/18 0436  NA 137 139 149*  K 4.2 4.5 4.5  CL 98 99 106  CO2 26 31 32  GLUCOSE 109* 132* 127*  BUN 15 22 24*  CREATININE 0.70 0.59 0.56  CALCIUM 9.4 9.0 9.3  MG  --   --  2.1   GFR: Estimated Creatinine Clearance: 56.5 mL/min (by C-G formula based on SCr of 0.56 mg/dL). Liver Function Tests: No results for input(s): AST, ALT, ALKPHOS, BILITOT, PROT, ALBUMIN in the last 168 hours. No results for input(s): LIPASE, AMYLASE in the last 168 hours. No results for input(s): AMMONIA in the last 168 hours. Coagulation Profile: No results for input(s): INR, PROTIME in the last 168 hours. Cardiac Enzymes: No results for input(s): CKTOTAL, CKMB, CKMBINDEX, TROPONINI in the last 168 hours. BNP (last 3 results) No results for input(s): PROBNP in the last 8760 hours. HbA1C: No results for input(s): HGBA1C in the last 72 hours. CBG: No results for input(s): GLUCAP in the last 168 hours. Lipid Profile: No results for input(s): CHOL, HDL, LDLCALC, TRIG, CHOLHDL, LDLDIRECT in the last 72 hours. Thyroid Function Tests: No results for input(s): TSH, T4TOTAL, FREET4, T3FREE, THYROIDAB in the last 72 hours. Anemia Panel: No results for input(s): VITAMINB12, FOLATE, FERRITIN, TIBC, IRON, RETICCTPCT in the last 72  hours. Sepsis Labs: No results for input(s): PROCALCITON, LATICACIDVEN in the last 168 hours.  Recent Results (from the past 240 hour(s))  Culture, blood (routine x 2) Call MD if unable to obtain prior to antibiotics being given     Status: None   Collection Time: 05/12/18  8:45 PM  Result Value Ref Range Status   Specimen Description   Final    BLOOD LEFT HAND Performed at [facility] [address], [place], [place] [address]    Special Requests   Final    BOTTLES DRAWN AEROBIC AND ANAEROBIC Blood Culture adequate volume Performed at [facility] [address], [place], [place] [address]    Culture   Final    NO GROWTH 5 DAYS Performed at [facility], [place] [address] [place], [place] [address]    Report Status 05/18/2018 FINAL  Final  Culture, blood (routine x 2) Call MD if unable to  obtain prior to antibiotics being given     Status: None   Collection Time: 05/12/18 11:16 PM  Result Value Ref Range Status   Specimen Description   Final    BLOOD LEFT ANTECUBITAL Performed at [facility] [address], [place], [place] [address]    Special Requests   Final    BOTTLES DRAWN AEROBIC AND ANAEROBIC Blood Culture adequate volume Performed at [facility] [address], [place], [place] [address]    Culture   Final    NO GROWTH 5 DAYS Performed at [facility], [place] [address], [place], [place] [address]    Report Status 05/18/2018 FINAL  Final  Culture, sputum-assessment     Status: None   Collection Time: 05/13/18  7:31 AM  Result Value Ref Range Status   Specimen Description SPUTUM  Final   Special Requests NONE  Final   Sputum evaluation   Final    THIS SPECIMEN IS ACCEPTABLE FOR SPUTUM CULTURE Performed at [facility], [place] [address], [place], [place] [address]    Report Status 05/13/2018 FINAL  Final  Culture, respiratory     Status: None   Collection  Time: 05/13/18  7:31 AM  Result Value Ref Range Status   Specimen Description   Final    SPUTUM Performed at [facility] [address] [place], [place] [address]    Special Requests   Final    NONE Reflexed from [id number] Performed at [facility], [place] [address] [place], [place] [address]    Gram Stain   Final    RARE WBC PRESENT,BOTH PMN AND MONONUCLEAR FEW GRAM POSITIVE COCCI IN PAIRS FEW GRAM NEGATIVE RODS    Culture   Final    Consistent with normal respiratory flora. Performed at [facility], [place] [address] [place], [place] [address]    Report Status 05/15/2018 FINAL  Final     Radiology Studies: Ct Head W & Wo Contrast  Result Date: 05/18/2018 CLINICAL DATA:  Non-small-cell lung cancer staging. History of colon cancer. EXAM: CT HEAD WITHOUT AND WITH CONTRAST TECHNIQUE: Contiguous axial images were obtained from the base of the skull through the vertex without and with intravenous contrast CONTRAST:  36mL OMNIPAQUE IOHEXOL 300 MG/ML  SOLN COMPARISON:  None. FINDINGS: Brain: Moderate atrophy. Extensive white matter hypodensity diffusely and symmetrically. Negative for acute infarct. Negative for hemorrhage or mass. No enhancing metastatic deposits postcontrast administration Vascular: Negative for hyperdense vessel. Atherosclerotic calcification. Skull: Negative Sinuses/Orbits: Paranasal sinuses clear.  Bilateral cataract surgery Other: None IMPRESSION: Negative for metastatic disease Atrophy and extensive chronic microvascular ischemic change in the white matter. No acute abnormality. Electronically Signed   By: Franchot Gallo M.D.   On: 05/18/2018 15:08   Ct Abdomen Pelvis W Contrast  Result Date: 05/18/2018 CLINICAL DATA:  Restaging colon cancer. EXAM: CT ABDOMEN AND PELVIS WITH CONTRAST TECHNIQUE: Multidetector CT imaging of the abdomen and pelvis was performed using the standard protocol following bolus administration of  intravenous contrast. CONTRAST:  25mL OMNIPAQUE IOHEXOL 300 MG/ML  SOLN COMPARISON:  02/06/2015 CT scan. FINDINGS: Lower chest: Persistent dense airspace consolidation in the right lower lobe with air bronchograms. This is unchanged when compared to prior chest CTs from 2019. There is also a right-sided pleural effusion which appears stable when compared to the most recent chest CT from 07/23. Stable emphysematous changes. No worrisome pulmonary lesions. The heart is normal in size. Stable  aortic and coronary artery calcifications. Hepatobiliary: No focal hepatic lesions to suggest metastatic disease. The liver contour is slightly irregular and the hepatic fissures are prominent. Findings could suggest cirrhosis. The gallbladder is contracted. No common bile duct dilatation. Pancreas: No mass, inflammation or ductal dilatation. Spleen: Normal size.  No focal lesions. Adrenals/Urinary Tract: Stable small adrenal gland nodule on the left. This is likely a benign adenoma. No renal lesions or hydronephrosis.  The bladder is normal. Stomach/Bowel: The stomach, duodenum, small bowel and colon are unremarkable. No acute inflammatory process, mass lesions or obstructive findings. Stable surgical changes from a partial right colectomy. No findings worrisome for recurrent tumor. Moderate stool throughout the colon and down into the rectum may suggest constipation. Vascular/Lymphatic: Advanced atherosclerotic calcifications involving the aorta and iliac arteries and branch vessels. No aneurysm or dissection. No mesenteric or retroperitoneal mass or adenopathy. Reproductive: Surgically absent. Other: No pelvic mass or adenopathy. No free pelvic fluid collections. No inguinal mass or adenopathy. No abdominal wall hernia or subcutaneous lesions. Small left inguinal hernia containing fat. Small periumbilical abdominal wall hernia on the right side containing only fat. Musculoskeletal: No significant bony findings. 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 IMPRESSION: 1. Stable surgical changes from a partial right hemicolectomy. The ileocolonic anastomosis is unremarkable. No findings suspicious for recurrent tumor, locoregional adenopathy or metastatic disease elsewhere. 2. Stable dense airspace consolidation in the right lower lobe along with a right pleural effusion. 3. Advanced atherosclerotic calcifications involving the aorta, iliac arteries and branch vessels. 4. Small periumbilical abdominal wall hernia and left inguinal hernia. 5. Stool throughout the colon and down into the rectum could suggest constipation. 6. Stable small left adrenal gland nodule, likely benign adenoma. Electronically Signed   By: Marijo Sanes M.D.   On: 05/18/2018 15:52    Scheduled Meds: . arformoterol  15 mcg Nebulization BID  . atorvastatin  10 mg Oral q morning - 10a  . budesonide (PULMICORT) nebulizer solution  0.5 mg Nebulization BID  . calcium-vitamin D  1 tablet Oral q morning - 10a  . docusate sodium  100 mg Oral QHS  . folic acid  1 mg Oral q morning - 10a  . irbesartan  150 mg Oral Daily   And  . hydrochlorothiazide  12.5 mg Oral Daily  . HYDROcodone-homatropine  5 mL Oral QHS,MR X 1  . hydrocortisone   Rectal BID  . ipratropium  0.5 mg Nebulization TID  . iron polysaccharides  150 mg Oral Daily  . levalbuterol  0.63 mg Nebulization TID  . loratadine  10 mg Oral QHS  . methylPREDNISolone (SOLU-MEDROL) injection  60 mg Intravenous Q12H  . multivitamin with minerals  1 tablet Oral q morning - 10a  . vitamin B-12  2,500 mcg Oral q morning - 10a   Continuous Infusions: . sodium chloride 500 mL (05/19/18 0958)  . ceFEPime (MAXIPIME) IV 1 g (05/19/18 1000)     LOS: 7 days    Time spent: Total of 25 minutes spent with pt, greater than 50% of which was spent in discussion of  treatment, counseling and coordination of care  Chipper Oman, MD Pager: Text Page via [email or web address]   If 7PM-7AM, please  contact night-coverage [email or web address] 05/19/2018, 1:55 PM   Note - This record has been created using [employer]. Chart creation errors have been sought, but may not always have been located. Such creation errors do not reflect on the standard of medical care.

## 2018-05-19 NOTE — Progress Notes (Signed)
Radiation Oncology         ([phone]) [phone] ________________________________  Initial Inpatient Consultation Note  Name: Casey Wilkinson MRN: [id number]  Date: 05/19/2018  DOB: 1941-04-07  JM:EQAST, Jenny Reichmann, MD  Volanda Napoleon, MD   REFERRING PHYSICIAN: Volanda Napoleon, MD  DIAGNOSIS: adenocarcinoma of the right lower lung  HISTORY OF PRESENT ILLNESS::Casey Wilkinson is a 77 y.o. female who is seen out courtesy of Dr. Burney Gauze for consideration for palliative radiation therapy as part of management of patient's recently diagnosed adenocarcinoma presenting in the right lower lung area.  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  Patient was recently discharged on 05/09/2018 after being admitted for pneumonia, underwent bronchoscopy and found to have adenocarcinoma of the right lung.  Patient was discharged on antibiotic therapy, however over the past 3 days she has become more short of breath especially with exertion and was dropping her saturations and decided to come to the emergency room.  Upon ED evaluation CT of the chest showed persistent right lower lobe collapse/infiltrate and small right pleural effusion patient was started on empiric antibiotic and received steroids.  She is admitted with working diagnosis of post obstructive pneumonia. Patient has had slow improvement in her overall condition since admission. No significant signs of infection on cultures. Patient was evaluated by medical oncology and given her overall performance status would not be a good candidate for chemotherapy at this time. Additional testing of her lung specimen will be performed as you should be a candidate for targeted therapy although doubtful with her prior history of smoking.  PREVIOUS RADIATION THERAPY: No  PAST MEDICAL HISTORY:  has a past medical history of Anemia, Arthritis, Carotid artery stenosis,  Colitis (April 2016), Colon cancer [facility]), COPD (chronic obstructive pulmonary disease) ([place]), Discoid lupus erythematosus, Diverticulitis (April 2016), History of blood transfusion (01/2015, 06/2015), Hypertension, Psoriatic arthritis ([place]), Shortness of breath dyspnea, and Squamous cell carcinoma, leg.    PAST SURGICAL HISTORY: Past Surgical History:  Procedure Laterality Date  . APPENDECTOMY  06/2015  . CATARACT EXTRACTION W/ INTRAOCULAR LENS  IMPLANT, BILATERAL Bilateral 2017  . COLONOSCOPY  06/23/15  . COLONOSCOPY W/ POLYPECTOMY    . CYSTOCELE REPAIR  2006  . DILATION AND CURETTAGE OF UTERUS  X 2  . EXCISIONAL HEMORRHOIDECTOMY  2006  . JOINT REPLACEMENT    . LAPAROSCOPIC PARTIAL COLECTOMY N/A 06/23/2015   Procedure: LAPAROSCOPIC ILEOCECETOMY;  Surgeon: Excell Seltzer, MD;  Location: [facility];  Service: General;  Laterality: N/A;  . MOHS SURGERY Left    "basal; LLE"  . RECTOCELE REPAIR  2006  . SQUAMOUS CELL CARCINOMA EXCISION  "several"   "legs mostly"  . TOTAL KNEE ARTHROPLASTY Left 01/31/2016   Procedure: LEFT TOTAL KNEE ARTHROPLASTY;  Surgeon: Leandrew Koyanagi, MD;  Location: [facility];  Service: Orthopedics;  Laterality: Left;  Marland Kitchen VAGINAL HYSTERECTOMY  1968   "total"  . VIDEO BRONCHOSCOPY Bilateral 05/06/2018   Procedure: VIDEO BRONCHOSCOPY WITH FLUORO;  Surgeon: Collene Gobble, MD;  Location: [facility];  Service: Cardiopulmonary;  Laterality: Bilateral;    FAMILY HISTORY: family history includes Alzheimer's disease in her mother; Heart disease in her father; Hyperlipidemia in her father; Hypertension in her father; Pneumonia in her mother.  SOCIAL HISTORY:  reports that she quit smoking about 17 years ago. Her smoking use included cigarettes. She has a 25.00 pack-year smoking history. She has never used smokeless tobacco.  She reports that she does not drink alcohol or use drugs. lives in the [place] area with her husband  ALLERGIES: Prochlorperazine edisylate; Sulfamethoxazole; and  Doxycycline  MEDICATIONS:  No current facility-administered medications for this encounter.    No current outpatient medications on file.   Facility-Administered Medications Ordered in Other Encounters  Medication Dose Route Frequency Provider Last Rate Last Dose  . 0.9 %  sodium chloride infusion   Intravenous PRN Patrecia Pour, Christean Grief, MD 10 mL/hr at 05/19/18 0958 500 mL at 05/19/18 0958  . acetaminophen (TYLENOL) tablet 650 mg  650 mg Oral Q8H PRN Etta Quill, DO   650 mg at 05/14/18 0946  . arformoterol (BROVANA) nebulizer solution 15 mcg  15 mcg Nebulization BID Patrecia Pour, Christean Grief, MD   15 mcg at 05/19/18 0801  . atorvastatin (LIPITOR) tablet 10 mg  10 mg Oral q morning - 10a Etta Quill, DO   10 mg at 05/19/18 0951  . budesonide (PULMICORT) nebulizer solution 0.5 mg  0.5 mg Nebulization BID Patrecia Pour, Christean Grief, MD   0.5 mg at 05/19/18 0801  . calcium-vitamin D (OSCAL WITH D) 500-200 MG-UNIT per tablet 1 tablet  1 tablet Oral q morning - 10a Etta Quill, DO   1 tablet at 05/19/18 [id number]  . ceFEPIme (MAXIPIME) 1 g in sodium chloride 0.9 % 100 mL IVPB  1 g Intravenous Q12H Patrecia Pour, Christean Grief, MD 200 mL/hr at 05/19/18 1000 1 g at 05/19/18 1000  . docusate sodium (COLACE) capsule 100 mg  100 mg Oral QHS Jennette Kettle M, DO   100 mg at 05/18/18 2138  . folic acid (FOLVITE) tablet 1 mg  1 mg Oral q morning - 10a Etta Quill, DO   1 mg at 05/19/18 7824  . irbesartan (AVAPRO) tablet 150 mg  150 mg Oral Daily Jennette Kettle M, DO   150 mg at 05/19/18 2353   And  . hydrochlorothiazide (MICROZIDE) capsule 12.5 mg  12.5 mg Oral Daily Jennette Kettle M, DO   12.5 mg at 05/19/18 0951  . HYDROcodone-acetaminophen (NORCO/VICODIN) 5-325 MG per tablet 1 tablet  1 tablet Oral Q6H PRN Etta Quill, DO   1 tablet at 05/19/18 0847  . HYDROcodone-homatropine (HYCODAN) 5-1.5 MG/5ML syrup 5 mL  5 mL Oral QHS,MR X 1 Etta Quill, DO   5 mL at 05/18/18 2138  . hydrocortisone (ANUSOL-HC)  2.5 % rectal cream   Rectal BID Patrecia Pour, Christean Grief, MD      . ipratropium (ATROVENT) nebulizer solution 0.5 mg  0.5 mg Nebulization TID Patrecia Pour, Christean Grief, MD   0.5 mg at 05/19/18 1412  . levalbuterol (XOPENEX) nebulizer solution 0.63 mg  0.63 mg Nebulization Q4H PRN Green, Terri L, RPH       And  . ipratropium (ATROVENT) nebulizer solution 0.5 mg  0.5 mg Nebulization Q4H PRN Minda Ditto, RPH      . iron polysaccharides (NIFEREX) capsule 150 mg  150 mg Oral Daily Jennette Kettle M, DO   150 mg at 05/19/18 0951  . levalbuterol (XOPENEX) nebulizer solution 0.63 mg  0.63 mg Nebulization TID Patrecia Pour, Christean Grief, MD   0.63 mg at 05/19/18 1412  . [START ON 05/20/2018] levofloxacin (LEVAQUIN) tablet 500 mg  500 mg Oral Daily Patrecia Pour, Christean Grief, MD      . loratadine [facility]) tablet 10 mg  10 mg Oral QHS Jennette Kettle M, DO   10 mg at 05/18/18 2138  . methylPREDNISolone sodium succinate (SOLU-MEDROL)  125 mg/2 mL injection 60 mg  60 mg Intravenous Q12H Patrecia Pour, Christean Grief, MD   60 mg at 05/19/18 1414  . multivitamin with minerals tablet 1 tablet  1 tablet Oral q morning - 10a Etta Quill, DO   1 tablet at 05/19/18 [phone]  . polyethylene glycol (MIRALAX / GLYCOLAX) packet 17 g  17 g Oral Daily PRN Etta Quill, DO      . vitamin B-12 (CYANOCOBALAMIN) tablet 2,500 mcg  2,500 mcg Oral q morning - 10a Etta Quill, DO   2,500 mcg at 05/19/18 5956    REVIEW OF SYSTEMS: REVIEW OF SYSTEMS: A 10+ POINT REVIEW OF SYSTEMS WAS OBTAINED including neurology, dermatology, psychiatry, cardiac, respiratory, lymph, extremities, GI, GU, musculoskeletal, constitutional, reproductive, HEENT. All pertinent positives are noted in the HPI. All others are negative. She does report some pain in the right upper quadrant with movement. This began 2-3 months ago She is pain-free as long as she is not moving.   PHYSICAL EXAM:  Vitals - 1 value per visit 3/87/5643  SYSTOLIC 329  DIASTOLIC 75  Pulse 518  Temperature  98  Respirations 20  Weight (lb)   Height   BMI   VISIT REPORT    General: Alert and oriented, in no acute distress, lying comfortably in her hospital bed at a 45 angle. Supplemental oxygen in place. With any movement the patient's heart rate jumps up into the 120s to 130s HEENT: Head is normocephalic. Extraocular movements are intact. Oropharynx is clear. Neck: Neck is supple, no palpable cervical or supraclavicular lymphadenopathy. Heart: Regular in rate and rhythm with no murmurs, rubs, or gallops. Chest: Clear to auscultation bilaterally, with no rhonchi, wheezes, or rales. Abdomen: Soft, nontender, nondistended, with no rigidity or guarding. Small scar along the umbilical area from her laparoscopic colon surgery. Extremities: No cyanosis or edema. Lymphatics: see Neck Exam Skin: No concerning lesions, some bruising noted along the forearm area presumably from IV placements. Musculoskeletal: symmetric strength and muscle tone throughout. Neurologic: Cranial nerves II through XII are grossly intact. No obvious focalities. Speech is fluent. Coordination is intact. Psychiatric: Judgment and insight are intact. Affect is appropriate. Bladder catheter in place    ECOG = 4  0 - Asymptomatic (Fully active, able to carry on all predisease activities without restriction)  1 - Symptomatic but completely ambulatory (Restricted in physically strenuous activity but ambulatory and able to carry out work of a light or sedentary nature. For example, light housework, office work)  2 - Symptomatic, <50% in bed during the day (Ambulatory and capable of all self care but unable to carry out any work activities. Up and about more than 50% of waking hours)  3 - Symptomatic, >50% in bed, but not bedbound (Capable of only limited self-care, confined to bed or chair 50% or more of waking hours)  4 - Bedbound (Completely disabled. Cannot carry on any self-care. Totally confined to bed or chair)  5 -  Death   Eustace Pen MM, Creech RH, Tormey DC, et al. [phone]). "Toxicity and response criteria of the [facility]". [place] Oncol. 5 (6): 649-55  LABORATORY DATA:  Lab Results  Component Value Date   WBC 12.8 (H) 05/19/2018   HGB 10.0 (L) 05/19/2018   HCT 31.7 (L) 05/19/2018   MCV 92.2 05/19/2018   PLT 325 05/19/2018   NEUTROABS 11.7 (H) 05/19/2018   Lab Results  Component Value Date   NA 149 (H) 05/17/2018   K  4.5 05/17/2018   CL 106 05/17/2018   CO2 32 05/17/2018   GLUCOSE 127 (H) 05/17/2018   CREATININE 0.56 05/17/2018   CALCIUM 9.3 05/17/2018      RADIOGRAPHY: Dg Chest 2 View  Result Date: 05/12/2018 CLINICAL DATA:  Shortness of breath. Diagnosed with lung cancer 4 days ago. History of colon cancer. EXAM: CHEST - 2 VIEW COMPARISON:  Chest radiograph May 06, 2018 FINDINGS: Persistent RIGHT lung base consolidation. Increasing lobulated moderate RIGHT, small LEFT pleural effusions. Cardiac silhouette is upper limits of normal size. Calcified aortic arch. Increased lung volumes with mild apical bullous changes suspected. No pneumothorax. Soft tissue planes and included osseous structures are non suspicious. IMPRESSION: Increasing moderate RIGHT and small LEFT pleural effusions. Persistent consolidation RIGHT lower lobe. Aortic Atherosclerosis (ICD10-I70.0). Emphysema (ICD10-J43.9). Electronically Signed   By: Elon Alas M.D.   On: 05/12/2018 17:52   Dg Chest 2 View  Result Date: 04/30/2018 CLINICAL DATA:  77 year old female with dense right lower lobe consolidation/pneumonia. Subsequent encounter. EXAM: CHEST - 2 VIEW COMPARISON:  Chest CT 04/22/2018 and earlier. FINDINGS: Compared to radiographs on 02/26/2018, confluent right lung base opacity has minimally improved. Lung parenchyma elsewhere remains stable and clear. Upper lobe emphysema again evident. Stable cardiac size and mediastinal contours. Visualized tracheal air column is within normal limits. No  pneumothorax. Absence of right pleural effusion better demonstrated on the recent CT. Osteopenia. Calcified aortic atherosclerosis. Negative visible bowel gas pattern. IMPRESSION: 1. Continued dense right lung base consolidation. No significant improvement since 02/26/2018. 2. No new cardiopulmonary abnormality.  Emphysema. Electronically Signed   By: Genevie Ann M.D.   On: 04/30/2018 15:06   Ct Head W & Wo Contrast  Result Date: 05/18/2018 CLINICAL DATA:  Non-small-cell lung cancer staging. History of colon cancer. EXAM: CT HEAD WITHOUT AND WITH CONTRAST TECHNIQUE: Contiguous axial images were obtained from the base of the skull through the vertex without and with intravenous contrast CONTRAST:  52mL OMNIPAQUE IOHEXOL 300 MG/ML  SOLN COMPARISON:  None. FINDINGS: Brain: Moderate atrophy. Extensive white matter hypodensity diffusely and symmetrically. Negative for acute infarct. Negative for hemorrhage or mass. No enhancing metastatic deposits postcontrast administration Vascular: Negative for hyperdense vessel. Atherosclerotic calcification. Skull: Negative Sinuses/Orbits: Paranasal sinuses clear.  Bilateral cataract surgery Other: None IMPRESSION: Negative for metastatic disease Atrophy and extensive chronic microvascular ischemic change in the white matter. No acute abnormality. Electronically Signed   By: Franchot Gallo M.D.   On: 05/18/2018 15:08   Ct Chest Wo Contrast  Result Date: 04/22/2018 CLINICAL DATA:  History of lupus with right lower lobe pneumonia. EXAM: CT CHEST WITHOUT CONTRAST TECHNIQUE: Multidetector CT imaging of the chest was performed following the standard protocol without IV contrast. COMPARISON:  03/17/2018 FINDINGS: Cardiovascular: Heart size upper normal. Trace pericardial effusion is similar. Coronary artery calcification is evident. Atherosclerotic calcification is noted in the wall of the thoracic aorta. Mediastinum/Nodes: Scattered small mediastinal lymph nodes again noted. No  evidence for gross hilar lymphadenopathy although assessment is limited by the lack of intravenous contrast on today's study. The esophagus has normal imaging features. There is no axillary lymphadenopathy. Lungs/Pleura: Centrilobular emphysema again noted. Dense right lower lobe airspace consolidation with air bronchograms is not appreciably changed in the interval. The medial component of differential attenuation is similar to prior and has apparent cavitation. 7 mm left lower lobe pulmonary nodule (5:99) is unchanged. Architectural distortion/scarring in the medial left base is stable. 5 mm left lung nodule (5:98) is unchanged. Upper Abdomen: Atherosclerotic calcification noted  abdominal aorta. Musculoskeletal: No worrisome lytic or sclerotic osseous abnormality. IMPRESSION: 1. No substantial interval change in the dense right lower lobe consolidative disease. 2. Stable pulmonary nodules left lower lung. 3. Emphysema 4.  Aortic Atherosclerois (ICD10-170.0) Electronically Signed   By: Misty Stanley M.D.   On: 04/22/2018 14:48   Ct Angio Chest Pe W/cm &/or Wo Cm  Result Date: 05/12/2018 CLINICAL DATA:  Short of breath.  History of lung cancer EXAM: CT ANGIOGRAPHY CHEST WITH CONTRAST TECHNIQUE: Multidetector CT imaging of the chest was performed using the standard protocol during bolus administration of intravenous contrast. Multiplanar CT image reconstructions and MIPs were obtained to evaluate the vascular anatomy. CONTRAST:  139mL ISOVUE-370 IOPAMIDOL (ISOVUE-370) INJECTION 76% COMPARISON:  Chest CT 04/22/2018 FINDINGS: Cardiovascular: Negative for pulmonary embolism. Pulmonary arteries normal in caliber. Atherosclerotic calcification aortic arch without aneurysm or dissection. Heart size within normal limits. Mediastinum/Nodes: Negative for mass or adenopathy Lungs/Pleura: Persistent right lower lobe collapse with small right effusion. No obstructing mass lesion. Recent biopsy positive for right lower low  adenocarcinoma. Severe emphysema. Left lower lobe scarring. No effusion on the left. Upper Abdomen: Negative Musculoskeletal: Multilevel degenerative change. No acute skeletal abnormality. Review of the MIP images confirms the above findings. IMPRESSION: Negative for pulmonary embolism Persistent right lower lobe collapse and small right effusion. Aortic Atherosclerosis (ICD10-I70.0) and Emphysema (ICD10-J43.9). Electronically Signed   By: Franchot Gallo M.D.   On: 05/12/2018 20:09   Ct Abdomen Pelvis W Contrast  Result Date: 05/18/2018 CLINICAL DATA:  Restaging colon cancer. EXAM: CT ABDOMEN AND PELVIS WITH CONTRAST TECHNIQUE: Multidetector CT imaging of the abdomen and pelvis was performed using the standard protocol following bolus administration of intravenous contrast. CONTRAST:  73mL OMNIPAQUE IOHEXOL 300 MG/ML  SOLN COMPARISON:  02/06/2015 CT scan. FINDINGS: Lower chest: Persistent dense airspace consolidation in the right lower lobe with air bronchograms. This is unchanged when compared to prior chest CTs from 2019. There is also a right-sided pleural effusion which appears stable when compared to the most recent chest CT from 07/23. Stable emphysematous changes. No worrisome pulmonary lesions. The heart is normal in size. Stable aortic and coronary artery calcifications. Hepatobiliary: No focal hepatic lesions to suggest metastatic disease. The liver contour is slightly irregular and the hepatic fissures are prominent. Findings could suggest cirrhosis. The gallbladder is contracted. No common bile duct dilatation. Pancreas: No mass, inflammation or ductal dilatation. Spleen: Normal size.  No focal lesions. Adrenals/Urinary Tract: Stable small adrenal gland nodule on the left. This is likely a benign adenoma. No renal lesions or hydronephrosis.  The bladder is normal. Stomach/Bowel: The stomach, duodenum, small bowel and colon are unremarkable. No acute inflammatory process, mass lesions or obstructive  findings. Stable surgical changes from a partial right colectomy. No findings worrisome for recurrent tumor. Moderate stool throughout the colon and down into the rectum may suggest constipation. Vascular/Lymphatic: Advanced atherosclerotic calcifications involving the aorta and iliac arteries and branch vessels. No aneurysm or dissection. No mesenteric or retroperitoneal mass or adenopathy. Reproductive: Surgically absent. Other: No pelvic mass or adenopathy. No free pelvic fluid collections. No inguinal mass or adenopathy. No abdominal wall hernia or subcutaneous lesions. Small left inguinal hernia containing fat. Small periumbilical abdominal wall hernia on the right side containing only fat. Musculoskeletal: No significant bony findings. Advanced degenerate disc disease noted at L2-3 and L5-S1. IMPRESSION: 1. Stable surgical changes from a partial right hemicolectomy. The ileocolonic anastomosis is unremarkable. No findings suspicious for recurrent tumor, locoregional adenopathy or metastatic disease elsewhere.  2. Stable dense airspace consolidation in the right lower lobe along with a right pleural effusion. 3. Advanced atherosclerotic calcifications involving the aorta, iliac arteries and branch vessels. 4. Small periumbilical abdominal wall hernia and left inguinal hernia. 5. Stool throughout the colon and down into the rectum could suggest constipation. 6. Stable small left adrenal gland nodule, likely benign adenoma. Electronically Signed   By: Marijo Sanes M.D.   On: 05/18/2018 15:52   Dg Chest Port 1 View  Result Date: 05/15/2018 CLINICAL DATA:  Shortness of breath EXAM: PORTABLE CHEST 1 VIEW COMPARISON:  05/12/2018 FINDINGS: Cardiac shadow is stable. Right pleural effusion is again noted with right basilar infiltrate. Small left pleural effusion is seen as well. No bony abnormality is noted. IMPRESSION: The overall appearance is similar to that seen on recent CT examination with persistent  predominant right basilar changes identified. Electronically Signed   By: Inez Catalina M.D.   On: 05/15/2018 12:18   Dg Chest Portable 1 View  Result Date: 05/06/2018 CLINICAL DATA:  Chest pain EXAM: PORTABLE CHEST 1 VIEW COMPARISON:  05/06/2018 at 2:42 p.m. FINDINGS: Right lower lobe consolidation, stable. Atherosclerotic calcification of the aortic arch. Heart size within normal limits. Tapering of the peripheral pulmonary vasculature favors emphysema. IMPRESSION: 1. Stable consolidation in the right lower lobe. 2. Aortic Atherosclerosis (ICD10-I70.0) and Emphysema (ICD10-J43.9). Electronically Signed   By: Van Clines M.D.   On: 05/06/2018 22:11   Dg Chest Port 1 View  Result Date: 05/06/2018 CLINICAL DATA:  Followup bronchoscopy and biopsy. EXAM: PORTABLE CHEST 1 VIEW COMPARISON:  04/30/2018 FINDINGS: No pneumothorax or hemothorax. Emphysema and pulmonary scarring. Left lung remains clear. Worsened opacity in the right lower lung which could be due to lavage or hemorrhage. IMPRESSION: Worsened opacity in the right lower lung which could be due to lavage or postprocedure hemorrhage. Background emphysema and scarring. Electronically Signed   By: Nelson Chimes M.D.   On: 05/06/2018 15:17   Dg C-arm Bronchoscopy  Result Date: 05/06/2018 C-ARM BRONCHOSCOPY: Fluoroscopy was utilized by the requesting physician.  No radiographic interpretation.      IMPRESSION: 77 year old female with recent diagnosis of adenocarcinoma presenting in the right lower lung area. Initially the patient was felt to have postobstructive pneumonia however despite aggressive antibiotic therapy her overall breathing situation is not improved significantly. Biopsies from the right lower lung area revealed adenocarcinoma, well-differentiated. There were no endobronchial lesion or mass effect present to produce lung collapse. In reviewing the patient's most recent chest CT scan the right lower lung appears dense suggesting  tumor infiltration throughout this area. This evening I discussed potential options for patient including palliative care/hospice consultation, palliative radiation therapy or more comprehensive radiation therapy over 6 weeks. At this time the patient would like to proceed with a shorter course of  radiation therapy directed the right lower lung area. After a 3 week break she will be reevaluated with consideration for additional radiation therapy depending on her overall performance status.   PLAN:CT simulation and planning Wednesday, July 31 with treatments to begin later that day or August 1.  I anticipate 10 treatments initially followed by a break of  3 weeks with assessment for additional radiation therapy depending on her overall performance status and clinical situation.    ------------------------------------------------  Blair Promise, PhD, MD

## 2018-05-19 NOTE — Consult Note (Signed)
   [facility] CM Inpatient Consult   05/19/2018  ROLENE ANDRADES 1940/12/26 [id number]   Patient screened for potential [facility] Care Management program due to 22% (high) unplanned readmission risk score.   Spoke with Mrs. Naron to discuss [facility] Management program services. She is agreeable and written [facility] consent obtained. [facility] folder provided.   Mrs. Arvin endorses she lives with her husband who takes her to MD appointments as well.  Confirmed Primary Care MD is Dr. Virgina Jock ( [place] is listed as doing transition of care calls post discharge). Discussed [facility] Care Management follow up for COPD.  Denies having any concerns with obtaining medications.  Confirms best contact number as [phone].   Explained [facility] Care Management will not interfere or replace services provided by home health.   Will make inpatient RNCM aware [facility] to follow post discharge.   Will make referral to [facility].    Marthenia Rolling, MSN-Ed, RN,BSN [facility] Liaison [phone]

## 2018-05-19 NOTE — Progress Notes (Signed)
Oncology Nurse Navigator Documentation  Oncology Nurse Navigator Flowsheets 05/19/2018  Navigator Location [facility]  Navigator Encounter Type Other/I followed up on Casey Wilkinson. She is still in the hospital at this time.  Dr. Marin Olp saw her yesterday. I update Dr. Julien Nordmann and her appt with Dr. Julien Nordmann will be cancelled.   Treatment Phase Pre-Tx/Tx Discussion  Barriers/Navigation Needs Coordination of Care  Interventions Coordination of Care  Coordination of Care Other  Acuity Level 1  Time Spent with Patient 15

## 2018-05-19 NOTE — Care Management Important Message (Signed)
Important Message  Patient Details  Name: RHIANON ZABAWA MRN: [id number] Date of Birth: 03/16/1941   Medicare Important Message Given:  Yes    Kerin Salen 05/19/2018, 10:55 AMImportant Message  Patient Details  Name: YOBANA CULLITON MRN: [id number] Date of Birth: 12/26/1940   Medicare Important Message Given:  Yes    Kerin Salen 05/19/2018, 10:55 AM

## 2018-05-19 NOTE — Care Management Note (Signed)
Case Management Note  Patient Details  Name: Casey Wilkinson MRN: [id number] Date of Birth: 30-Jun-1941  Per previous CM note on 7/20, pt selected [facility] for home services if services were needed. Per PT eval HHPT is recommended. [facility] rep alerted of need for HHPT services. Will need MD order for HHPT prior to DC.  Lynnell Catalan, RN 05/19/2018, 11:20 AM

## 2018-05-20 ENCOUNTER — Ambulatory Visit
Admit: 2018-05-20 | Discharge: 2018-05-20 | Disposition: A | Discharge status: Home / Self Care | Payer: Medicare Other | Attending: Radiation Oncology | Admitting: Radiation Oncology

## 2018-05-20 DIAGNOSIS — C3491 Malignant neoplasm of unspecified part of right bronchus or lung: Secondary | ICD-10-CM

## 2018-05-20 LAB — BASIC METABOLIC PANEL
ANION GAP: 6 (ref 5–15)
BUN: 26 mg/dL — AB (ref 8–23)
CO2: 31 mmol/L (ref 22–32)
Calcium: 8.8 mg/dL — ABNORMAL LOW (ref 8.9–10.3)
Chloride: 100 mmol/L (ref 98–111)
Creatinine, Ser: 0.58 mg/dL (ref 0.44–1.00)
Glucose, Bld: 91 mg/dL (ref 70–99)
POTASSIUM: 4.1 mmol/L (ref 3.5–5.1)
SODIUM: 137 mmol/L (ref 135–145)

## 2018-05-20 LAB — CBC
HCT: 32.8 % — ABNORMAL LOW (ref 36.0–46.0)
Hemoglobin: 10.4 g/dL — ABNORMAL LOW (ref 12.0–15.0)
MCH: 29.2 pg (ref 26.0–34.0)
MCHC: 31.7 g/dL (ref 30.0–36.0)
MCV: 92.1 fL (ref 78.0–100.0)
PLATELETS: 317 10*3/uL (ref 150–400)
RBC: 3.56 MIL/uL — ABNORMAL LOW (ref 3.87–5.11)
RDW: 16.3 % — ABNORMAL HIGH (ref 11.5–15.5)
WBC: 13.8 10*3/uL — AB (ref 4.0–10.5)

## 2018-05-20 LAB — MAGNESIUM: MAGNESIUM: 2.2 mg/dL (ref 1.7–2.4)

## 2018-05-20 LAB — PHOSPHORUS: PHOSPHORUS: 2.4 mg/dL — AB (ref 2.5–4.6)

## 2018-05-20 MED ORDER — PREDNISONE 50 MG PO TABS
50.0000 mg | ORAL_TABLET | Freq: Every day | ORAL | Status: DC
Start: 2018-05-21 — End: 2018-05-23
  Administered 2018-05-21 – 2018-05-23 (×3): 50 mg via ORAL
  Filled 2018-05-20 (×3): qty 1

## 2018-05-20 NOTE — Progress Notes (Addendum)
  Radiation Oncology         [phone] ________________________________  Name: Casey Wilkinson MRN: [id number]  Date: 05/20/2018  DOB: 1941-09-19  SIMULATION AND TREATMENT PLANNING NOTE - inpatient    ICD-10-CM   1. Adenocarcinoma of right lung [facility]) C34.91     DIAGNOSIS:  adenocarcinoma of the right lower lung    NARRATIVE:  The patient was brought to the [place].  Identity was confirmed.  All relevant records and images related to the planned course of therapy were reviewed.  The patient freely provided informed written consent to proceed with treatment after reviewing the details related to the planned course of therapy. The consent form was witnessed and verified by the simulation staff.  Then, the patient was set-up in a stable reproducible  supine position for radiation therapy.  CT images were obtained.  Surface markings were placed.  The CT images were loaded into the planning software.  Then the target and avoidance structures were contoured.  Treatment planning then occurred.  The radiation prescription was entered and confirmed.  Then, I designed and supervised the construction of a total of 5 medically necessary complex treatment devices.  I have requested : 3D Simulation  I have requested a DVH of the following structures: GTV, PTV, spinal cord, lungs.  I have ordered:dose calc.  PLAN:  The patient will receive 30 Gy in 10 fractions.  -----------------------------------  Blair Promise, PhD, MD

## 2018-05-20 NOTE — Progress Notes (Signed)
PROGRESS NOTE    Casey Wilkinson  XVQ:[id number] DOB: 1941/08/23 DOA: 05/12/2018 PCP: Shon Baton, MD     Brief Narrative:  Casey Wilkinson is a 77 year old female with medical history significant for COPD on 2 L oxygen at home, hypertension, discoid lupus and recently diagnosed with adenocarcinoma right lung.  Patient was recently discharged on 05/09/2018 after being admitted for pneumonia, underwent bronchoscopy and found to have adenocarcinoma of the right lung.  Patient was discharged on antibiotic therapy, however over the past 3 days she has become more short of breath especially with exertion and was dropping her saturations and decided to come to the emergency room.  Upon ED evaluation CT of the chest showed persistent right lower lobe collapse/infiltrate and small right pleural effusion patient was started on empiric antibiotic and received steroids.  She is admitted with working diagnosis of post obstructive pneumonia.  New events last 24 hours / Subjective: Patient sitting in chair this morning, no new complaints.  States that she gets very short of breath with minimal exertion such as moving from the bed to the chair, has tachycardia and increase O2 needs.  Assessment & Plan:   Principal Problem:   Acute on chronic respiratory failure with hypoxia (HCC) Active Problems:   COPD (chronic obstructive pulmonary disease) (HCC)   HTN (hypertension)   Adenocarcinoma of right lung (HCC)   HCAP (healthcare-associated pneumonia)   Acute on chronic respiratory failure with hypoxia due to postobstructive pneumonia/adenocarcinoma of the right lower lobe -Requires 2L [place] at baseline, currently on 4L [place]. Continue to wean as able -Blood cultures negative -Respiratory culture normal flora  -Initially started on cefepime and vancomycin for postobstructive/HCAP PNA, then switched to Levaquin. Continue levaquin total 10 days antibiotic therapy.  -Started on Solu-Medrol, transition to prednisone     Leukocytosis -Likely due to steroids. No fevers. Monitor CBC   COPD  -Continue Brovana and Pulmicort twice daily, DuoNeb's as needed   Hypertension -Continue avapro-HCTZ   Adenocarcinoma of right lung -Oncology was consulted, recommending radiation oncology -CT head, A/P shows no signs of mets  -Radiation oncology planning for radiation tx beginning today or tomorrow for 10 treatments   Anemia of chronic disease/Iron deficiency -She has been transfused 1 unit of PRBC during hospital stay, 1 dose of IV iron also was given -Hgb remains stable  HLD -Continue lipitor      DVT prophylaxis: SCD, no anticoagulation as patient complained of hemoptysis at the time of admission Code Status: Full code Family Communication: Multiple family members at bedside Disposition Plan: Pending improvement, home versus skilled nursing facility on discharge    Consultants:   Oncology  Radiation oncology  Procedures:   None  Antimicrobials:  Anti-infectives (From admission, onward)   Start     Dose/Rate Route Frequency Ordered Stop   05/20/18 1000  levofloxacin (LEVAQUIN) tablet 500 mg     500 mg Oral Daily 05/19/18 1402     05/13/18 2200  vancomycin (VANCOCIN) IVPB 1000 mg/200 mL premix  Status:  Discontinued     1,000 mg 200 mL/hr over 60 Minutes Intravenous Every 24 hours 05/12/18 2131 05/14/18 1354   05/13/18 1000  levofloxacin (LEVAQUIN) tablet 500 mg  Status:  Discontinued     500 mg Oral Daily 05/12/18 2037 05/12/18 2039   05/12/18 2200  ceFEPIme (MAXIPIME) 1 g in sodium chloride 0.9 % 100 mL IVPB     1 g 200 mL/hr over 30 Minutes Intravenous Every 12 hours 05/12/18 2126 05/19/18 2230  05/12/18 2200  vancomycin (VANCOCIN) 1,500 mg in sodium chloride 0.9 % 500 mL IVPB     1,500 mg 250 mL/hr over 120 Minutes Intravenous  Once 05/12/18 2130 05/13/18 0204        Objective: Vitals:   05/19/18 2245 05/20/18 0519 05/20/18 0844 05/20/18 1320  BP: 131/61 (!) 165/70   127/65  Pulse: 99 96  (!) 104  Resp: 16 16  16   Temp: 97.7 F (36.5 C) 98.2 F (36.8 C)  98 F (36.7 C)  TempSrc: Oral Oral  Oral  SpO2: 97% 97% 90% 97%  Weight:      Height:        Intake/Output Summary (Last 24 hours) at 05/20/2018 1333 Last data filed at 05/20/2018 2951 Gross per 24 hour  Intake 1958.28 ml  Output 1300 ml  Net 658.28 ml   Filed Weights   05/12/18 1722  Weight: 68.9 kg (152 lb)    Examination:  General exam: Appears calm and comfortable  Respiratory system: Diminished breath sounds on right, [place] O2 4L. Respiratory effort normal. Cardiovascular system: S1 & S2 heard, RRR. No JVD, murmurs, rubs, gallops or clicks. No pedal edema. Gastrointestinal system: Abdomen is nondistended, soft and nontender. No organomegaly or masses felt. Normal bowel sounds heard. Central nervous system: Alert and oriented. No focal neurological deficits. Extremities: Symmetric 5 x 5 power. Skin: No rashes, lesions or ulcers Psychiatry: Judgement and insight appear normal. Mood & affect appropriate.   Data Reviewed: I have personally reviewed following labs and imaging studies  CBC: Recent Labs  Lab 05/15/18 0415 05/16/18 0410 05/17/18 0436 05/19/18 0430 05/20/18 0433  WBC 9.7 8.1 8.4 12.8* 13.8*  NEUTROABS  --   --  7.6 11.7*  --   HGB 7.7* 9.1* 9.6* 10.0* 10.4*  HCT 24.2* 29.0* 30.1* 31.7* 32.8*  MCV 92.4 91.8 90.7 92.2 92.1  PLT 301 284 306 325 884   Basic Metabolic Panel: Recent Labs  Lab 05/16/18 0410 05/17/18 0436 05/20/18 0433  NA 139 149* 137  K 4.5 4.5 4.1  CL 99 106 100  CO2 31 32 31  GLUCOSE 132* 127* 91  BUN 22 24* 26*  CREATININE 0.59 0.56 0.58  CALCIUM 9.0 9.3 8.8*  MG  --  2.1 2.2  PHOS  --   --  2.4*   GFR: Estimated Creatinine Clearance: 56.5 mL/min (by C-G formula based on SCr of 0.58 mg/dL). Liver Function Tests: No results for input(s): AST, ALT, ALKPHOS, BILITOT, PROT, ALBUMIN in the last 168 hours. No results for input(s): LIPASE,  AMYLASE in the last 168 hours. No results for input(s): AMMONIA in the last 168 hours. Coagulation Profile: No results for input(s): INR, PROTIME in the last 168 hours. Cardiac Enzymes: No results for input(s): CKTOTAL, CKMB, CKMBINDEX, TROPONINI in the last 168 hours. BNP (last 3 results) No results for input(s): PROBNP in the last 8760 hours. HbA1C: No results for input(s): HGBA1C in the last 72 hours. CBG: No results for input(s): GLUCAP in the last 168 hours. Lipid Profile: No results for input(s): CHOL, HDL, LDLCALC, TRIG, CHOLHDL, LDLDIRECT in the last 72 hours. Thyroid Function Tests: No results for input(s): TSH, T4TOTAL, FREET4, T3FREE, THYROIDAB in the last 72 hours. Anemia Panel: No results for input(s): VITAMINB12, FOLATE, FERRITIN, TIBC, IRON, RETICCTPCT in the last 72 hours. Sepsis Labs: No results for input(s): PROCALCITON, LATICACIDVEN in the last 168 hours.  Recent Results (from the past 240 hour(s))  Culture, blood (routine x 2) Call MD if  unable to obtain prior to antibiotics being given     Status: None   Collection Time: 05/12/18  8:45 PM  Result Value Ref Range Status   Specimen Description   Final    BLOOD LEFT HAND Performed at [facility] [address] [place], [place] [address]    Special Requests   Final    BOTTLES DRAWN AEROBIC AND ANAEROBIC Blood Culture adequate volume Performed at [facility] [address] [place], [place] [address]    Culture   Final    NO GROWTH 5 DAYS Performed at [facility], [place] [address], [place], [place] [address]    Report Status 05/18/2018 FINAL  Final  Culture, blood (routine x 2) Call MD if unable to obtain prior to antibiotics being given     Status: None   Collection Time: 05/12/18 11:16 PM  Result Value Ref Range Status   Specimen Description   Final    BLOOD LEFT ANTECUBITAL Performed at [facility] [address], [place], [place]  [address]    Special Requests   Final    BOTTLES DRAWN AEROBIC AND ANAEROBIC Blood Culture adequate volume Performed at [facility] [address], [place], [place] [address]    Culture   Final    NO GROWTH 5 DAYS Performed at [facility], [place] [address], [place], [place] [address]    Report Status 05/18/2018 FINAL  Final  Culture, sputum-assessment     Status: None   Collection Time: 05/13/18  7:31 AM  Result Value Ref Range Status   Specimen Description SPUTUM  Final   Special Requests NONE  Final   Sputum evaluation   Final    THIS SPECIMEN IS ACCEPTABLE FOR SPUTUM CULTURE Performed at [facility], [place] [address], [place], [place] [address]    Report Status 05/13/2018 FINAL  Final  Culture, respiratory     Status: None   Collection Time: 05/13/18  7:31 AM  Result Value Ref Range Status   Specimen Description   Final    SPUTUM Performed at [facility] [address], [place], [place] [address]    Special Requests   Final    NONE Reflexed from [id number] Performed at [facility], [place] [address] [place], [place] [address]    Gram Stain   Final    RARE WBC PRESENT,BOTH PMN AND MONONUCLEAR FEW GRAM POSITIVE COCCI IN PAIRS FEW GRAM NEGATIVE RODS    Culture   Final    Consistent with normal respiratory flora. Performed at [facility], [place] [address] [place], [place] [address]    Report Status 05/15/2018 FINAL  Final       Radiology Studies: Ct Head W & Wo Contrast  Result Date: 05/18/2018 CLINICAL DATA:  Non-small-cell lung cancer staging. History of colon cancer. EXAM: CT HEAD WITHOUT AND WITH CONTRAST TECHNIQUE: Contiguous axial images were obtained from the base of the skull through the vertex without and with intravenous contrast CONTRAST:  36mL OMNIPAQUE IOHEXOL 300 MG/ML  SOLN COMPARISON:  None. FINDINGS: Brain: Moderate atrophy. Extensive white matter hypodensity diffusely and  symmetrically. Negative for acute infarct. Negative for hemorrhage or mass. No enhancing metastatic deposits postcontrast administration Vascular: Negative for hyperdense vessel. Atherosclerotic calcification. Skull: Negative Sinuses/Orbits: Paranasal sinuses clear.  Bilateral cataract surgery Other: None IMPRESSION: Negative for metastatic disease Atrophy and extensive chronic microvascular ischemic change in the white matter.  No acute abnormality. Electronically Signed   By: Franchot Gallo M.D.   On: 05/18/2018 15:08   Ct Abdomen Pelvis W Contrast  Result Date: 05/18/2018 CLINICAL DATA:  Restaging colon cancer. EXAM: CT ABDOMEN AND PELVIS WITH CONTRAST TECHNIQUE: Multidetector CT imaging of the abdomen and pelvis was performed using the standard protocol following bolus administration of intravenous contrast. CONTRAST:  20mL OMNIPAQUE IOHEXOL 300 MG/ML  SOLN COMPARISON:  02/06/2015 CT scan. FINDINGS: Lower chest: Persistent dense airspace consolidation in the right lower lobe with air bronchograms. This is unchanged when compared to prior chest CTs from 2019. There is also a right-sided pleural effusion which appears stable when compared to the most recent chest CT from 07/23. Stable emphysematous changes. No worrisome pulmonary lesions. The heart is normal in size. Stable aortic and coronary artery calcifications. Hepatobiliary: No focal hepatic lesions to suggest metastatic disease. The liver contour is slightly irregular and the hepatic fissures are prominent. Findings could suggest cirrhosis. The gallbladder is contracted. No common bile duct dilatation. Pancreas: No mass, inflammation or ductal dilatation. Spleen: Normal size.  No focal lesions. Adrenals/Urinary Tract: Stable small adrenal gland nodule on the left. This is likely a benign adenoma. No renal lesions or hydronephrosis.  The bladder is normal. Stomach/Bowel: The stomach, duodenum, small bowel and colon are unremarkable. No acute inflammatory  process, mass lesions or obstructive findings. Stable surgical changes from a partial right colectomy. No findings worrisome for recurrent tumor. Moderate stool throughout the colon and down into the rectum may suggest constipation. Vascular/Lymphatic: Advanced atherosclerotic calcifications involving the aorta and iliac arteries and branch vessels. No aneurysm or dissection. No mesenteric or retroperitoneal mass or adenopathy. Reproductive: Surgically absent. Other: No pelvic mass or adenopathy. No free pelvic fluid collections. No inguinal mass or adenopathy. No abdominal wall hernia or subcutaneous lesions. Small left inguinal hernia containing fat. Small periumbilical abdominal wall hernia on the right side containing only fat. Musculoskeletal: No significant bony findings. Advanced degenerate disc disease noted at L2-3 and L5-S1. IMPRESSION: 1. Stable surgical changes from a partial right hemicolectomy. The ileocolonic anastomosis is unremarkable. No findings suspicious for recurrent tumor, locoregional adenopathy or metastatic disease elsewhere. 2. Stable dense airspace consolidation in the right lower lobe along with a right pleural effusion. 3. Advanced atherosclerotic calcifications involving the aorta, iliac arteries and branch vessels. 4. Small periumbilical abdominal wall hernia and left inguinal hernia. 5. Stool throughout the colon and down into the rectum could suggest constipation. 6. Stable small left adrenal gland nodule, likely benign adenoma. Electronically Signed   By: Marijo Sanes M.D.   On: 05/18/2018 15:52      Scheduled Meds: . arformoterol  15 mcg Nebulization BID  . atorvastatin  10 mg Oral q morning - 10a  . budesonide (PULMICORT) nebulizer solution  0.5 mg Nebulization BID  . calcium-vitamin D  1 tablet Oral q morning - 10a  . docusate sodium  100 mg Oral QHS  . folic acid  1 mg Oral q morning - 10a  . irbesartan  150 mg Oral Daily   And  . hydrochlorothiazide  12.5 mg  Oral Daily  . HYDROcodone-homatropine  5 mL Oral QHS,MR X 1  . hydrocortisone   Rectal BID  . ipratropium  0.5 mg Nebulization TID  . iron polysaccharides  150 mg Oral Daily  . levalbuterol  0.63 mg Nebulization TID  . levofloxacin  500 mg Oral Daily  . loratadine  10 mg Oral QHS  . methylPREDNISolone (SOLU-MEDROL) injection  60  mg Intravenous Q12H  . multivitamin with minerals  1 tablet Oral q morning - 10a  . vitamin B-12  2,500 mcg Oral q morning - 10a   Continuous Infusions: . sodium chloride Stopped (05/19/18 2317)     LOS: 8 days    Time spent: 35 minutes   Dessa Phi, DO [facility] [email or web address] Password [email or web address] 05/20/2018, 1:33 PM

## 2018-05-20 NOTE — Progress Notes (Signed)
PT Cancellation Note  Patient Details Name: GRICEL COPEN MRN: [id number] DOB: 1941-06-07   Cancelled Treatment:    Reason Eval/Treat Not Completed: Medical issues which prohibited therapy  Patient in recliner, putting on deoderant, sats *82%, HR 117. Dyspnea 4/4. States she needs to recover. Will check back another time.   Claretha Cooper 05/20/2018, 10:03 AM  Tresa Endo PT [phone]

## 2018-05-20 NOTE — Progress Notes (Addendum)
Physical Therapy Treatment Patient Details Name: Casey Wilkinson MRN: [id number] DOB: 06/06/1941 Today's Date: 05/20/2018    History of Present Illness Casey Wilkinson is a 77 year old female with medical history significant for COPD on 2 L oxygen at home, hypertension, discoid lupus and recently diagnosed with adenocarcinoma right lung.  Patient was recently discharged on 05/09/2018 after being admitted for pneumonia    PT Comments    The patient tolerated exercises and 5' ambulation. HR 123 with standing activity, Sats drop to 85%. On 4 L.  patient reports desire to go to SNF for rehab.  Continue PT.  Follow Up Recommendations  SNF     Equipment Recommendations  None recommended by PT    Recommendations for Other Services       Precautions / Restrictions Precautions Precautions: None Precaution Comments: watch HR/O2     Mobility  Bed Mobility               General bed mobility comments: DNT, received up in sitting   Transfers   Equipment used: Rolling walker (2 wheeled) Transfers: Sit to/from Stand Sit to Stand: Min guard         General transfer comment: stood at recliner x 2,    Ambulation/Gait Ambulation/Gait assistance: Min guard Gait Distance (Feet): 5 Feet Assistive device: Rolling walker (2 wheeled) Gait Pattern/deviations: Step-through pattern     General Gait Details: HR 115, sats 87% for  the short distance   Stairs             Wheelchair Mobility    Modified Rankin (Stroke Patients Only)       Balance                                            Cognition Arousal/Alertness: Awake/alert                                            Exercises General Exercises - Upper Extremity Shoulder Flexion: AROM;Both;10 reps;Seated Shoulder Horizontal ABduction: AROM;Both;10 reps;Seated General Exercises - Lower Extremity Ankle Circles/Pumps: Both;20 reps;Seated Long Arc Quad: Both;10  reps;Seated Hip ABduction/ADduction: Both;Seated;10 reps Hip Flexion/Marching: AROM;Both;20 reps;Seated;Standing Toe Raises: AROM;Both;5 reps;Standing    General Comments        Pertinent Vitals/Pain Pain Assessment: No/denies pain    Home Living                      Prior Function            PT Goals (current goals can now be found in the care plan section) Progress towards PT goals: Progressing toward goals    Frequency    Min 3X/week      PT Plan Discharge plan needs to be updated    Co-evaluation              AM-PAC PT "6 Clicks" Daily Activity  Outcome Measure  Difficulty turning over in bed (including adjusting bedclothes, sheets and blankets)?: None Difficulty moving from lying on back to sitting on the side of the bed? : None Difficulty sitting down on and standing up from a chair with arms (e.g., wheelchair, bedside commode, etc,.)?: A Lot Help needed moving to and from a bed to chair (including a wheelchair)?: A  Little Help needed walking in hospital room?: Total Help needed climbing 3-5 steps with a railing? : Total 6 Click Score: 15    End of Session Equipment Utilized During Treatment: Oxygen Activity Tolerance: Patient tolerated treatment well Patient left: with call bell/phone within reach Nurse Communication: Mobility status PT Visit Diagnosis: Other abnormalities of gait and mobility (R26.89);Difficulty in walking, not elsewhere classified (R26.2)     Time: 8101-7510 PT Time Calculation (min) (ACUTE ONLY): 15 min  Charges:  $Therapeutic Activity: 8-22 mins                     [facility] PT [phone]    Claretha Cooper 05/20/2018, 3:58 PM

## 2018-05-21 ENCOUNTER — Inpatient Hospital Stay: Payer: Medicare Other | Admitting: Internal Medicine

## 2018-05-21 ENCOUNTER — Ambulatory Visit
Admit: 2018-05-21 | Discharge: 2018-05-21 | Disposition: A | Discharge status: Home / Self Care | Payer: Medicare Other | Source: Ambulatory Visit | Attending: Radiation Oncology | Admitting: Radiation Oncology

## 2018-05-21 ENCOUNTER — Inpatient Hospital Stay: Payer: Medicare Other

## 2018-05-21 DIAGNOSIS — C3491 Malignant neoplasm of unspecified part of right bronchus or lung: Secondary | ICD-10-CM

## 2018-05-21 DIAGNOSIS — C3431 Malignant neoplasm of lower lobe, right bronchus or lung: Secondary | ICD-10-CM | POA: Insufficient documentation

## 2018-05-21 DIAGNOSIS — Z510 Encounter for antineoplastic radiation therapy: Secondary | ICD-10-CM | POA: Insufficient documentation

## 2018-05-21 LAB — BASIC METABOLIC PANEL
Anion gap: 7 (ref 5–15)
BUN: 24 mg/dL — ABNORMAL HIGH (ref 8–23)
CALCIUM: 8.3 mg/dL — AB (ref 8.9–10.3)
CO2: 31 mmol/L (ref 22–32)
CREATININE: 0.53 mg/dL (ref 0.44–1.00)
Chloride: 100 mmol/L (ref 98–111)
Glucose, Bld: 83 mg/dL (ref 70–99)
Potassium: 3.7 mmol/L (ref 3.5–5.1)
SODIUM: 138 mmol/L (ref 135–145)

## 2018-05-21 LAB — CBC
HCT: 30.7 % — ABNORMAL LOW (ref 36.0–46.0)
Hemoglobin: 9.7 g/dL — ABNORMAL LOW (ref 12.0–15.0)
MCH: 29.3 pg (ref 26.0–34.0)
MCHC: 31.6 g/dL (ref 30.0–36.0)
MCV: 92.7 fL (ref 78.0–100.0)
PLATELETS: 249 10*3/uL (ref 150–400)
RBC: 3.31 MIL/uL — ABNORMAL LOW (ref 3.87–5.11)
RDW: 16.3 % — AB (ref 11.5–15.5)
WBC: 10.5 10*3/uL (ref 4.0–10.5)

## 2018-05-21 NOTE — Progress Notes (Signed)
Ms. Opfer is doing okay.  She still is on oxygen.  She still does get short of breath with exertion.  She was seen by Dr. Sondra Come of radiation oncology.  He will set her up to start radiation therapy in a day or so.  Again, there is no focal lesion that he can target.  Hopefully, he will have the field set up so that she will have opening of the right lower lung.  I do not see any role for chemotherapy right now.  I am sending off the biopsies for molecular analysis.  Hopefully, we will be able to find some that we can target with systemic therapy.  Her labs today show white cell count of 10.5.  Hemoglobin 9.7.  Platelet count 249K.  Her potassium is 3.7.  Creatinine 0.53.  Unfortunate, a PET scan cannot be done while she is an inpatient.  We will have to get one set up as an outpatient.  She lives in [place].  Because this, will be a lot easier for her to be seen at the main cancer center.  We will have to make arrangements for her to be followed up at the main cancer center upon discharge.  Her vital signs seem okay.  Blood pressure 143/60.  Her pulse is 93.  Temperature is 98.  Oxygen saturation is 93%.  Her lungs show some decent breath sounds bilaterally.  There may be some slight decrease on the right side.  She has some crackles bilaterally.  Cardiac exam regular rate and rhythm.  She has an occasional extra beat.  Abdomen is soft.  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  This is adenocarcinoma.  She is a former smoker.  I did send off genetic analysis.  We will see if there is anything with her genetic analysis that can further identify her cancer and see if there is any other therapy that could be considered.  I appreciate everybody's help with her.  Lattie Haw, MD  Psalm 86:5

## 2018-05-21 NOTE — Progress Notes (Signed)
PROGRESS NOTE    Casey Wilkinson  ZDG:[id number] DOB: 07-20-1941 DOA: 05/12/2018 PCP: Shon Baton, MD     Brief Narrative:  Casey Wilkinson is a 77 year old female with medical history significant for COPD on 2 L oxygen at home, hypertension, discoid lupus and recently diagnosed with adenocarcinoma right lung.  Patient was recently discharged on 05/09/2018 after being admitted for pneumonia, underwent bronchoscopy and found to have adenocarcinoma of the right lung.  Patient was discharged on antibiotic therapy, however over the past 3 days she has become more short of breath especially with exertion and was dropping her saturations and decided to come to the emergency room.  Upon ED evaluation CT of the chest showed persistent right lower lobe collapse/infiltrate and small right pleural effusion patient was started on empiric antibiotic and received steroids.  She is admitted with working diagnosis of post obstructive pneumonia.  New events last 24 hours / Subjective: Continues to have shortness of breath with exertion, moved from bed to chair this morning and she remains dyspneic with conversation.  Cough has not changed.  No other new issues.  Assessment & Plan:   Principal Problem:   Acute on chronic respiratory failure with hypoxia (HCC) Active Problems:   COPD (chronic obstructive pulmonary disease) (HCC)   HTN (hypertension)   Adenocarcinoma of right lung (HCC)   HCAP (healthcare-associated pneumonia)   Acute on chronic respiratory failure with hypoxia due to postobstructive pneumonia/adenocarcinoma of the right lower lobe -Requires 2L [place] at baseline, currently on 4L [place]. Continue to wean as able -Blood cultures negative -Respiratory culture normal flora  -Initially started on cefepime and vancomycin for postobstructive/HCAP PNA, then switched to Levaquin. Continue levaquin total 10 days antibiotic therapy.  -Continue prednisone and wean as able   Leukocytosis -Likely due to  steroids. No fevers. Resolved.   COPD  -Continue Brovana and Pulmicort twice daily, DuoNeb's as needed   Hypertension -Continue avapro-HCTZ   Adenocarcinoma of right lung -Oncology was consulted, recommending radiation oncology -CT head, A/P shows no signs of mets  -Radiation oncology planning for radiation tx   Anemia of chronic disease/Iron deficiency -She has been transfused 1 unit of PRBC during hospital stay, 1 dose of IV iron also was given -Hgb remains stable  HLD -Continue lipitor      DVT prophylaxis: SCD, no anticoagulation as patient complained of hemoptysis at the time of admission Code Status: Full code Family Communication: No family members at bedside Disposition Plan: Pending improvement, SNF on discharge    Consultants:   Oncology  Radiation oncology  Procedures:   None  Antimicrobials:  Anti-infectives (From admission, onward)   Start     Dose/Rate Route Frequency Ordered Stop   05/20/18 1000  levofloxacin (LEVAQUIN) tablet 500 mg     500 mg Oral Daily 05/19/18 1402 05/22/18 2359   05/13/18 2200  vancomycin (VANCOCIN) IVPB 1000 mg/200 mL premix  Status:  Discontinued     1,000 mg 200 mL/hr over 60 Minutes Intravenous Every 24 hours 05/12/18 2131 05/14/18 1354   05/13/18 1000  levofloxacin (LEVAQUIN) tablet 500 mg  Status:  Discontinued     500 mg Oral Daily 05/12/18 2037 05/12/18 2039   05/12/18 2200  ceFEPIme (MAXIPIME) 1 g in sodium chloride 0.9 % 100 mL IVPB     1 g 200 mL/hr over 30 Minutes Intravenous Every 12 hours 05/12/18 2126 05/19/18 2230   05/12/18 2200  vancomycin (VANCOCIN) 1,500 mg in sodium chloride 0.9 % 500 mL IVPB  1,500 mg 250 mL/hr over 120 Minutes Intravenous  Once 05/12/18 2130 05/13/18 0204       Objective: Vitals:   05/20/18 1927 05/20/18 2111 05/21/18 0544 05/21/18 0809  BP:  (!) 140/57 (!) 143/60   Pulse:  (!) 102 93   Resp:  16 17   Temp:  97.9 F (36.6 C) 98 F (36.7 C)   TempSrc:  Oral Oral     SpO2: 93% 99% 93% 93%  Weight:      Height:        Intake/Output Summary (Last 24 hours) at 05/21/2018 1149 Last data filed at 05/21/2018 0410 Gross per 24 hour  Intake 600 ml  Output 1300 ml  Net -700 ml   Filed Weights   05/12/18 1722  Weight: 68.9 kg (152 lb)    Examination: General exam: Appears calm and comfortable  Respiratory system: Diminished breath sounds right lower base, respiratory effort increased, conversational dyspnea, nasal cannula O2 4 L  Cardiovascular system: S1 & S2 heard, RRR. No JVD, murmurs, rubs, gallops or clicks. No pedal edema. Gastrointestinal system: Abdomen is nondistended, soft and nontender. No organomegaly or masses felt. Normal bowel sounds heard. Central nervous system: Alert and oriented. No focal neurological deficits. Extremities: Symmetric 5 x 5 power. Skin: No rashes, lesions or ulcers Psychiatry: Judgement and insight appear normal. Mood & affect appropriate.   Data Reviewed: I have personally reviewed following labs and imaging studies  CBC: Recent Labs  Lab 05/16/18 0410 05/17/18 0436 05/19/18 0430 05/20/18 0433 05/21/18 0353  WBC 8.1 8.4 12.8* 13.8* 10.5  NEUTROABS  --  7.6 11.7*  --   --   HGB 9.1* 9.6* 10.0* 10.4* 9.7*  HCT 29.0* 30.1* 31.7* 32.8* 30.7*  MCV 91.8 90.7 92.2 92.1 92.7  PLT 284 306 325 317 518   Basic Metabolic Panel: Recent Labs  Lab 05/16/18 0410 05/17/18 0436 05/20/18 0433 05/21/18 0353  NA 139 149* 137 138  K 4.5 4.5 4.1 3.7  CL 99 106 100 100  CO2 31 32 31 31  GLUCOSE 132* 127* 91 83  BUN 22 24* 26* 24*  CREATININE 0.59 0.56 0.58 0.53  CALCIUM 9.0 9.3 8.8* 8.3*  MG  --  2.1 2.2  --   PHOS  --   --  2.4*  --    GFR: Estimated Creatinine Clearance: 56.5 mL/min (by C-G formula based on SCr of 0.53 mg/dL). Liver Function Tests: No results for input(s): AST, ALT, ALKPHOS, BILITOT, PROT, ALBUMIN in the last 168 hours. No results for input(s): LIPASE, AMYLASE in the last 168 hours. No results  for input(s): AMMONIA in the last 168 hours. Coagulation Profile: No results for input(s): INR, PROTIME in the last 168 hours. Cardiac Enzymes: No results for input(s): CKTOTAL, CKMB, CKMBINDEX, TROPONINI in the last 168 hours. BNP (last 3 results) No results for input(s): PROBNP in the last 8760 hours. HbA1C: No results for input(s): HGBA1C in the last 72 hours. CBG: No results for input(s): GLUCAP in the last 168 hours. Lipid Profile: No results for input(s): CHOL, HDL, LDLCALC, TRIG, CHOLHDL, LDLDIRECT in the last 72 hours. Thyroid Function Tests: No results for input(s): TSH, T4TOTAL, FREET4, T3FREE, THYROIDAB in the last 72 hours. Anemia Panel: No results for input(s): VITAMINB12, FOLATE, FERRITIN, TIBC, IRON, RETICCTPCT in the last 72 hours. Sepsis Labs: No results for input(s): PROCALCITON, LATICACIDVEN in the last 168 hours.  Recent Results (from the past 240 hour(s))  Culture, blood (routine x 2) Call MD if  unable to obtain prior to antibiotics being given     Status: None   Collection Time: 05/12/18  8:45 PM  Result Value Ref Range Status   Specimen Description   Final    BLOOD LEFT HAND Performed at [facility] [address] [place], [place] [address]    Special Requests   Final    BOTTLES DRAWN AEROBIC AND ANAEROBIC Blood Culture adequate volume Performed at [facility] [address], [place], [place] [address]    Culture   Final    NO GROWTH 5 DAYS Performed at [facility], [place] [address] [place], [place] [address]    Report Status 05/18/2018 FINAL  Final  Culture, blood (routine x 2) Call MD if unable to obtain prior to antibiotics being given     Status: None   Collection Time: 05/12/18 11:16 PM  Result Value Ref Range Status   Specimen Description   Final    BLOOD LEFT ANTECUBITAL Performed at [facility] [address] [place], [place] [address]    Special Requests   Final     BOTTLES DRAWN AEROBIC AND ANAEROBIC Blood Culture adequate volume Performed at [facility] [address], [place], [place] [address]    Culture   Final    NO GROWTH 5 DAYS Performed at [facility], [place] [address] [place], [place] [address]    Report Status 05/18/2018 FINAL  Final  Culture, sputum-assessment     Status: None   Collection Time: 05/13/18  7:31 AM  Result Value Ref Range Status   Specimen Description SPUTUM  Final   Special Requests NONE  Final   Sputum evaluation   Final    THIS SPECIMEN IS ACCEPTABLE FOR SPUTUM CULTURE Performed at [facility], [place] [address], [place], [place] [address]    Report Status 05/13/2018 FINAL  Final  Culture, respiratory     Status: None   Collection Time: 05/13/18  7:31 AM  Result Value Ref Range Status   Specimen Description   Final    SPUTUM Performed at [facility] [address] [place], [place] [address]    Special Requests   Final    NONE Reflexed from [id number] Performed at [facility] [place] [address], [place], [place] [address]    Gram Stain   Final    RARE WBC PRESENT,BOTH PMN AND MONONUCLEAR FEW GRAM POSITIVE COCCI IN PAIRS FEW GRAM NEGATIVE RODS    Culture   Final    Consistent with normal respiratory flora. Performed at [facility], [place] [address] [place],  [address]    Report Status 05/15/2018 FINAL  Final       Radiology Studies: No results found.    Scheduled Meds: . arformoterol  15 mcg Nebulization BID  . atorvastatin  10 mg Oral q morning - 10a  . budesonide (PULMICORT) nebulizer solution  0.5 mg Nebulization BID  . calcium-vitamin D  1 tablet Oral q morning - 10a  . docusate sodium  100 mg Oral QHS  . folic acid  1 mg Oral q morning - 10a  . irbesartan  150 mg Oral Daily   And  . hydrochlorothiazide  12.5 mg Oral Daily  . HYDROcodone-homatropine  5 mL Oral QHS,MR X 1  . hydrocortisone   Rectal  BID  . ipratropium  0.5 mg Nebulization TID  . iron polysaccharides  150 mg  Oral Daily  . levalbuterol  0.63 mg Nebulization TID  . levofloxacin  500 mg Oral Daily  . loratadine  10 mg Oral QHS  . multivitamin with minerals  1 tablet Oral q morning - 10a  . predniSONE  50 mg Oral Q breakfast  . vitamin B-12  2,500 mcg Oral q morning - 10a   Continuous Infusions: . sodium chloride Stopped (05/19/18 2317)     LOS: 9 days    Time spent: 20 minutes   Dessa Phi, DO [facility] [email or web address] Password [facility] 05/21/2018, 11:49 AM

## 2018-05-21 NOTE — Progress Notes (Signed)
  Radiation Oncology         ([phone]) [phone] ________________________________  Name: Casey Wilkinson MRN: [id number]  Date: 05/21/2018  DOB: [birth year]  Simulation Verification Note - inpatient    ICD-10-CM   1. Adenocarcinoma of right lung (HCC) C34.91     Status: inpatient  NARRATIVE: The patient was brought to the treatment unit and placed in the planned treatment position. The clinical setup was verified. Then port films were obtained and uploaded to the radiation oncology medical record software.  The treatment beams were carefully compared against the planned radiation fields. The position location and shape of the radiation fields was reviewed. They targeted volume of tissue appears to be appropriately covered by the radiation beams. Organs at risk appear to be excluded as planned.  Based on my personal review, I approved the simulation verification. The patient's treatment will proceed as planned.  -----------------------------------  Blair Promise, PhD, MD  This document serves as a record of services personally performed by Gery Pray, MD. It was created on his behalf by Wilburn Mylar, a trained medical scribe. The creation of this record is based on the scribe's personal observations and the provider's statements to them. This document has been checked and approved by the attending provider.

## 2018-05-22 ENCOUNTER — Ambulatory Visit
Admit: 2018-05-22 | Discharge: 2018-05-22 | Disposition: A | Discharge status: Home / Self Care | Payer: Medicare Other | Attending: Radiation Oncology | Admitting: Radiation Oncology

## 2018-05-22 LAB — BASIC METABOLIC PANEL
Anion gap: 5 (ref 5–15)
BUN: 24 mg/dL — ABNORMAL HIGH (ref 8–23)
CALCIUM: 8.5 mg/dL — AB (ref 8.9–10.3)
CO2: 32 mmol/L (ref 22–32)
Chloride: 99 mmol/L (ref 98–111)
Creatinine, Ser: 0.58 mg/dL (ref 0.44–1.00)
GFR calc Af Amer: >60 mL/min (ref >60)
GFR calc non Af Amer: >60 mL/min (ref >60)
Glucose, Bld: 92 mg/dL (ref 70–99)
Potassium: 3.9 mmol/L (ref 3.5–5.1)
Sodium: 136 mmol/L (ref 135–145)

## 2018-05-22 LAB — CBC
HCT: 29.7 % — ABNORMAL LOW (ref 36.0–46.0)
Hemoglobin: 9.5 g/dL — ABNORMAL LOW (ref 12.0–15.0)
MCH: 29.3 pg (ref 26.0–34.0)
MCHC: 32 g/dL (ref 30.0–36.0)
MCV: 91.7 fL (ref 78.0–100.0)
Platelets: 231 10*3/uL (ref 150–400)
RBC: 3.24 MIL/uL — ABNORMAL LOW (ref 3.87–5.11)
RDW: 16.3 % — AB (ref 11.5–15.5)
WBC: 12.7 10*3/uL — ABNORMAL HIGH (ref 4.0–10.5)

## 2018-05-22 MED ORDER — HYDROCODONE-HOMATROPINE 5-1.5 MG/5ML PO SYRP: 5.0000 mL | ORAL_SOLUTION | Freq: Every evening | ORAL | 0 refills | Status: DC | PRN

## 2018-05-22 MED ORDER — HYDROCODONE-ACETAMINOPHEN 5-325 MG PO TABS: 1.0000 | ORAL_TABLET | Freq: Four times a day (QID) | ORAL | 0 refills | Status: AC | PRN

## 2018-05-22 MED ORDER — PREDNISONE 10 MG PO TABS: ORAL_TABLET | ORAL | 0 refills | Status: DC

## 2018-05-22 NOTE — Clinical Social Work Note (Signed)
Clinical Social Work Assessment  Patient Details  Name: Casey Wilkinson MRN: [id number] Date of Birth: 13-Oct-1941  Date of referral:  05/22/18               Reason for consult:  Facility Placement                Permission sought to share information with:  Case Manager, Customer service manager, Family Supports Permission granted to share information::  Yes, Verbal Permission Granted  Name::     Transport planner::  SNF  Relationship::  Spouse  Contact Information:     Housing/Transportation Living arrangements for the past 2 months:  [place] of Information:  Patient, Spouse Patient Interpreter Needed:  None Criminal Activity/Legal Involvement Pertinent to Current Situation/Hospitalization:  No - Comment as needed Significant Relationships:  Adult Children, Spouse Lives with:  Spouse Do you feel safe going back to the place where you live?  Yes Need for family participation in patient care:  Yes (Comment)  Care giving concerns:  Patient reports that she now requires assistance with all ADLs due to oxygen levels.    Social Worker assessment / plan:  LCSW consulted for SNF placement.   Patient and spouse are agreeable to SNF for rehab. Patient and spouse prefer [facility] due to location. LCSW notified facility of preference.   Patient has a new lung cancer diagnosis. LCSW met with patient at bedside. Patient's spouse was present. LCSW explained role and reason for visit.   Patient and spouse reports that patient was independent prior to diagnosis. Patient reports that she requires assistance with all ADLs due to oxygen levels. Patient reports using a walker at home. Patient asked about getting a rollator to be able to sit when tired.     Employment status:  Retired Forensic scientist:  Medicare PT Recommendations:  [place] / Referral to community resources:     Patient/Family's Response to care:  Patient and family are  thankful for LCSW visit and coordination with dc planning.   Patient/Family's Understanding of and Emotional Response to Diagnosis, Current Treatment, and Prognosis:  Spouse expressed concerns with transportation to radiation treatments. Spouse concerned about getting patient in and out of care. LCSW explain that facility and cancer center can assist with that.   Emotional Assessment Appearance:  Appears stated age Attitude/Demeanor/Rapport:    Affect (typically observed):  Accepting, Calm, Pleasant Orientation:  Oriented to Self, Oriented to Place, Oriented to  Time, Oriented to Situation Alcohol / Substance use:  Tobacco Use(Past smoker) Psych involvement (Current and /or in the community):  No (Comment)  Discharge Needs  Concerns to be addressed:  No discharge needs identified Readmission within the last 30 days:  Yes Current discharge risk:  None Barriers to Discharge:  Continued Medical Work up   [employer], LCSW 05/22/2018, 11:18 AM

## 2018-05-22 NOTE — NC FL2 (Signed)
[place] LEVEL OF CARE SCREENING TOOL     IDENTIFICATION  Patient Name: Casey Wilkinson Birthdate: 07/23/1941 Sex: female Admission Date (Current Location): 05/12/2018  [facility] and [place] Number:  Herbalist and Address:  [facility],  [place], [place]      Provider Number: [id number]  Attending Physician Name and Address:  Dessa Phi, DO  Relative Name and Phone Number:  Shanon Brow, spouse, [phone]    Current Level of Care: Hospital Recommended Level of Care: [place] Prior Approval Number:    Date Approved/Denied:   PASRR Number: [id number]  Discharge Plan: SNF    Current Diagnoses: Patient Active Problem List   Diagnosis Date Noted  . HCAP (healthcare-associated pneumonia) 05/12/2018  . Adenocarcinoma of right lung ([place])   . Sepsis due to pneumonia ([place]) 05/06/2018  . S/P bronchoscopy with biopsy   . Pleuritic pain 04/30/2018  . Hemoptysis 04/30/2018  . Right lower lobe pneumonia ([place]) 03/31/2018  . Chronic rhinitis 11/04/2017  . Pain and swelling of right lower leg 05/05/2017  . Acute on chronic respiratory failure with hypoxia ([place]) 03/12/2017  . Pain of right hand 01/28/2017  . 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 Degenerative arthritis of left knee 01/31/2016  . 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 Nausea with vomiting   . Acute ischemic colitis ([place])   . Rectal bleeding   . Abdominal pain   . Blood in stool   . 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 Colitis 02/06/2015  . Abdominal mass 02/06/2015  . HTN (hypertension) 02/06/2015  . Peripheral vascular disease ([place]) 02/24/2014  . LUPUS ERYTHEMATOSUS, DISCOID 11/21/2009  . Essential hypertension 12/08/2008  . COPD (chronic obstructive pulmonary disease) ([place]) 12/08/2008    Orientation RESPIRATION BLADDER Height & Weight     Self, Time, Situation, Place  O2 Continent Weight: 152 lb (68.9 kg) Height:  5'  5" (165.1 cm)  BEHAVIORAL SYMPTOMS/MOOD NEUROLOGICAL BOWEL NUTRITION STATUS      Continent Diet(see dc summary)  AMBULATORY STATUS COMMUNICATION OF NEEDS Skin   Limited Assist Verbally Normal                       Personal Care Assistance Level of Assistance  Bathing, Feeding, Dressing Bathing Assistance: Maximum assistance Feeding assistance: Independent Dressing Assistance: Maximum assistance     Functional Limitations Info  Sight, Hearing, Speech Sight Info: Impaired Hearing Info: Adequate Speech Info: Adequate    SPECIAL CARE FACTORS FREQUENCY  PT (By licensed PT), OT (By licensed OT)     PT Frequency: 5x/week OT Frequency: 5x/week            Contractures      Additional Factors Info  Code Status, Allergies Code Status Info: Full Code Allergies Info: Doxycycline;Prochlorperazine Edisylate;Sulfamethoxazole           Current Medications (05/22/2018):  This is the current hospital active medication list Current Facility-Administered Medications  Medication Dose Route Frequency Provider Last Rate Last Dose  . 0.9 %  sodium chloride infusion   Intravenous PRN Patrecia Pour, Christean Grief, MD   Stopped at 05/19/18 2317  . acetaminophen (TYLENOL) tablet 650 mg  650 mg Oral Q8H PRN Etta Quill, DO   650 mg at 05/14/18 0946  . arformoterol (BROVANA) nebulizer solution 15 mcg  15 mcg Nebulization BID Patrecia Pour, Christean Grief, MD   15 mcg at 05/22/18 [id number]  . atorvastatin (LIPITOR) tablet 10 mg  10 mg Oral q morning - 10a Jennette Kettle M, DO   10 mg at 05/22/18 1055  . budesonide (PULMICORT) nebulizer solution 0.5 mg  0.5 mg Nebulization BID Patrecia Pour, Christean Grief, MD   0.5 mg at 05/22/18 0843  . calcium-vitamin D (OSCAL WITH D) 500-200 MG-UNIT per tablet 1 tablet  1 tablet Oral q morning - 10a Etta Quill, DO   1 tablet at 05/22/18 1056  . docusate sodium (COLACE) capsule 100 mg  100 mg Oral QHS Jennette Kettle M, DO   100 mg at 05/21/18 2103  . folic acid (FOLVITE) tablet 1  mg  1 mg Oral q morning - 10a Jennette Kettle M, DO   1 mg at 05/22/18 1055  . irbesartan (AVAPRO) tablet 150 mg  150 mg Oral Daily Jennette Kettle M, DO   150 mg at 05/22/18 1055   And  . hydrochlorothiazide (MICROZIDE) capsule 12.5 mg  12.5 mg Oral Daily Jennette Kettle M, DO   12.5 mg at 05/22/18 1055  . HYDROcodone-acetaminophen (NORCO/VICODIN) 5-325 MG per tablet 1 tablet  1 tablet Oral Q6H PRN Etta Quill, DO   1 tablet at 05/21/18 1257  . HYDROcodone-homatropine (HYCODAN) 5-1.5 MG/5ML syrup 5 mL  5 mL Oral QHS,MR X 1 Gardner, Jared M, DO   5 mL at 05/22/18 0210  . hydrocortisone (ANUSOL-HC) 2.5 % rectal cream   Rectal BID Patrecia Pour, Christean Grief, MD      . ipratropium (ATROVENT) nebulizer solution 0.5 mg  0.5 mg Nebulization TID Patrecia Pour, Christean Grief, MD   0.5 mg at 05/22/18 0843  . levalbuterol (XOPENEX) nebulizer solution 0.63 mg  0.63 mg Nebulization Q4H PRN Green, Terri L, RPH       And  . ipratropium (ATROVENT) nebulizer solution 0.5 mg  0.5 mg Nebulization Q4H PRN Minda Ditto, RPH      . iron polysaccharides (NIFEREX) capsule 150 mg  150 mg Oral Daily Jennette Kettle M, DO   150 mg at 05/22/18 1055  . levalbuterol (XOPENEX) nebulizer solution 0.63 mg  0.63 mg Nebulization TID Patrecia Pour, Christean Grief, MD   0.63 mg at 05/22/18 0843  . loratadine (CLARITIN) tablet 10 mg  10 mg Oral QHS Jennette Kettle M, DO   10 mg at 05/21/18 2103  . multivitamin with minerals tablet 1 tablet  1 tablet Oral q morning - 10a Etta Quill, DO   1 tablet at 05/22/18 1055  . polyethylene glycol (MIRALAX / GLYCOLAX) packet 17 g  17 g Oral Daily PRN Etta Quill, DO      . predniSONE (DELTASONE) tablet 50 mg  50 mg Oral Q breakfast Dessa Phi, DO   50 mg at 05/22/18 0725  . vitamin B-12 (CYANOCOBALAMIN) tablet 2,500 mcg  2,500 mcg Oral q morning - 10a Etta Quill, DO   2,500 mcg at 05/22/18 1055     Discharge Medications: Please see discharge summary for a list of discharge  medications.  Relevant Imaging Results:  Relevant Lab Results:   Additional Information SSN: [place], LCSW

## 2018-05-22 NOTE — Progress Notes (Signed)
PROGRESS NOTE    Casey Wilkinson  LKG:[id number] DOB: [birth year] DOA: 05/12/2018 PCP: Shon Baton, MD     Brief Narrative:  Casey Wilkinson is a 77 year old female with medical history significant for COPD on 2 L oxygen at home, hypertension, discoid lupus and recently diagnosed with adenocarcinoma right lung.  Patient was recently discharged on 05/09/2018 after being admitted for pneumonia, underwent bronchoscopy and found to have adenocarcinoma of the right lung.  Patient was discharged on antibiotic therapy, however over the past 3 days she has become more short of breath especially with exertion and was dropping her saturations and decided to come to the emergency room.  Upon ED evaluation CT of the chest showed persistent right lower lobe collapse/infiltrate and small right pleural effusion patient was started on empiric antibiotic and received steroids.  She is admitted with working diagnosis of post obstructive pneumonia.  New events last 24 hours / Subjective: No new issues.  Tolerating radiation.  She did have a headache yesterday which improved with Vicodin.  Assessment & Plan:   Principal Problem:   Acute on chronic respiratory failure with hypoxia (HCC) Active Problems:   COPD (chronic obstructive pulmonary disease) (HCC)   HTN (hypertension)   Adenocarcinoma of right lung (HCC)   HCAP (healthcare-associated pneumonia)   Acute on chronic respiratory failure with hypoxia due to postobstructive pneumonia/adenocarcinoma of the right lower lobe -Requires 2L [place] at baseline, currently on 4L [place]. Continue to wean as able -Blood cultures negative -Respiratory culture normal flora  -Initially started on cefepime and vancomycin for postobstructive/HCAP PNA, then switched to Levaquin. Continue levaquin total 10 days antibiotic therapy.  -Continue prednisone and wean as able   Leukocytosis -Likely due to steroids. No fevers.    COPD  -Continue Brovana and Pulmicort twice daily,  DuoNeb's as needed   Hypertension -Continue avapro-HCTZ   Adenocarcinoma of right lung -Oncology was consulted, recommending radiation oncology -CT head, A/P shows no signs of mets  -Radiation oncology for radiation tx   Anemia of chronic disease/Iron deficiency -She has been transfused 1 unit of PRBC during hospital stay, 1 dose of IV iron also was given -Hgb remains stable, 9.5 today   HLD -Continue lipitor     DVT prophylaxis: SCD, no anticoagulation as patient complained of hemoptysis at the time of admission Code Status: Full code Family Communication: Family members at bedside Disposition Plan:  SNF on discharge    Consultants:   Oncology  Radiation oncology  Procedures:   None  Antimicrobials:  Anti-infectives (From admission, onward)   Start     Dose/Rate Route Frequency Ordered Stop   05/20/18 1000  levofloxacin (LEVAQUIN) tablet 500 mg     500 mg Oral Daily 05/19/18 1402 05/22/18 1055   05/13/18 2200  vancomycin (VANCOCIN) IVPB 1000 mg/200 mL premix  Status:  Discontinued     1,000 mg 200 mL/hr over 60 Minutes Intravenous Every 24 hours 05/12/18 2131 05/14/18 1354   05/13/18 1000  levofloxacin (LEVAQUIN) tablet 500 mg  Status:  Discontinued     500 mg Oral Daily 05/12/18 2037 05/12/18 2039   05/12/18 2200  ceFEPIme (MAXIPIME) 1 g in sodium chloride 0.9 % 100 mL IVPB     1 g 200 mL/hr over 30 Minutes Intravenous Every 12 hours 05/12/18 2126 05/19/18 2230   05/12/18 2200  vancomycin (VANCOCIN) 1,500 mg in sodium chloride 0.9 % 500 mL IVPB     1,500 mg 250 mL/hr over 120 Minutes Intravenous  Once 05/12/18 2130  05/13/18 0204       Objective: Vitals:   05/21/18 1949 05/21/18 2034 05/22/18 0500 05/22/18 0843  BP:  120/61 127/65   Pulse:  91 92   Resp:  18 16   Temp:  98.4 F (36.9 C) 98.7 F (37.1 C)   TempSrc:  Oral Oral   SpO2: 94% 96% 92% 95%  Weight:      Height:        Intake/Output Summary (Last 24 hours) at 05/22/2018 1257 Last data  filed at 05/22/2018 1000 Gross per 24 hour  Intake 1070 ml  Output 1700 ml  Net -630 ml   Filed Weights   05/12/18 1722  Weight: 68.9 kg (152 lb)   Examination: General exam: Appears calm and comfortable  Respiratory system: Diminished right base. Respiratory effort normal. On 4L [place]  Cardiovascular system: S1 & S2 heard, RRR. No JVD, murmurs, rubs, gallops or clicks. No pedal edema. Gastrointestinal system: Abdomen is nondistended, soft and nontender. No organomegaly or masses felt. Normal bowel sounds heard. Central nervous system: Alert and oriented. No focal neurological deficits. Extremities: Symmetric 5 x 5 power. Skin: No rashes, lesions or ulcers Psychiatry: Judgement and insight appear normal. Mood & affect appropriate.    Data Reviewed: I have personally reviewed following labs and imaging studies  CBC: Recent Labs  Lab 05/17/18 0436 05/19/18 0430 05/20/18 0433 05/21/18 0353 05/22/18 0346  WBC 8.4 12.8* 13.8* 10.5 12.7*  NEUTROABS 7.6 11.7*  --   --   --   HGB 9.6* 10.0* 10.4* 9.7* 9.5*  HCT 30.1* 31.7* 32.8* 30.7* 29.7*  MCV 90.7 92.2 92.1 92.7 91.7  PLT 306 325 317 249 756   Basic Metabolic Panel: Recent Labs  Lab 05/16/18 0410 05/17/18 0436 05/20/18 0433 05/21/18 0353 05/22/18 0346  NA 139 149* 137 138 136  K 4.5 4.5 4.1 3.7 3.9  CL 99 106 100 100 99  CO2 31 32 31 31 32  GLUCOSE 132* 127* 91 83 92  BUN 22 24* 26* 24* 24*  CREATININE 0.59 0.56 0.58 0.53 0.58  CALCIUM 9.0 9.3 8.8* 8.3* 8.5*  MG  --  2.1 2.2  --   --   PHOS  --   --  2.4*  --   --    GFR: Estimated Creatinine Clearance: 56.5 mL/min (by C-G formula based on SCr of 0.58 mg/dL). Liver Function Tests: No results for input(s): AST, ALT, ALKPHOS, BILITOT, PROT, ALBUMIN in the last 168 hours. No results for input(s): LIPASE, AMYLASE in the last 168 hours. No results for input(s): AMMONIA in the last 168 hours. Coagulation Profile: No results for input(s): INR, PROTIME in the last 168  hours. Cardiac Enzymes: No results for input(s): CKTOTAL, CKMB, CKMBINDEX, TROPONINI in the last 168 hours. BNP (last 3 results) No results for input(s): PROBNP in the last 8760 hours. HbA1C: No results for input(s): HGBA1C in the last 72 hours. CBG: No results for input(s): GLUCAP in the last 168 hours. Lipid Profile: No results for input(s): CHOL, HDL, LDLCALC, TRIG, CHOLHDL, LDLDIRECT in the last 72 hours. Thyroid Function Tests: No results for input(s): TSH, T4TOTAL, FREET4, T3FREE, THYROIDAB in the last 72 hours. Anemia Panel: No results for input(s): VITAMINB12, FOLATE, FERRITIN, TIBC, IRON, RETICCTPCT in the last 72 hours. Sepsis Labs: No results for input(s): PROCALCITON, LATICACIDVEN in the last 168 hours.  Recent Results (from the past 240 hour(s))  Culture, blood (routine x 2) Call MD if unable to obtain prior to antibiotics being  given     Status: None   Collection Time: 05/12/18  8:45 PM  Result Value Ref Range Status   Specimen Description   Final    BLOOD LEFT HAND Performed at [facility], [place] [address] [place], [place] [address]    Special Requests   Final    BOTTLES DRAWN AEROBIC AND ANAEROBIC Blood Culture adequate volume Performed at [facility] [address] [place], [place] [address]    Culture   Final    NO GROWTH 5 DAYS Performed at [facility], [place] [address] [place], [place] [address]    Report Status 05/18/2018 FINAL  Final  Culture, blood (routine x 2) Call MD if unable to obtain prior to antibiotics being given     Status: None   Collection Time: 05/12/18 11:16 PM  Result Value Ref Range Status   Specimen Description   Final    BLOOD LEFT ANTECUBITAL Performed at [facility] [address], [place], [place] [address]    Special Requests   Final    BOTTLES DRAWN AEROBIC AND ANAEROBIC Blood Culture adequate volume Performed at [facility]  [address], [place], [place] [address]    Culture   Final    NO GROWTH 5 DAYS Performed at [facility], [place] [address], [place], [place] [address]    Report Status 05/18/2018 FINAL  Final  Culture, sputum-assessment     Status: None   Collection Time: 05/13/18  7:31 AM  Result Value Ref Range Status   Specimen Description SPUTUM  Final   Special Requests NONE  Final   Sputum evaluation   Final    THIS SPECIMEN IS ACCEPTABLE FOR SPUTUM CULTURE Performed at [facility], [place] [address], [place], [place] [address]    Report Status 05/13/2018 FINAL  Final  Culture, respiratory     Status: None   Collection Time: 05/13/18  7:31 AM  Result Value Ref Range Status   Specimen Description   Final    SPUTUM Performed at [facility] [address], [place], [place] [address]    Special Requests   Final    NONE Reflexed from [id number] Performed at [facility], [place] [address] [place], [place] [address]    Gram Stain   Final    RARE WBC PRESENT,BOTH PMN AND MONONUCLEAR FEW GRAM POSITIVE COCCI IN PAIRS FEW GRAM NEGATIVE RODS    Culture   Final    Consistent with normal respiratory flora. Performed at [facility], [place] [address] [place],  [address]    Report Status 05/15/2018 FINAL  Final       Radiology Studies: No results found.    Scheduled Meds: . arformoterol  15 mcg Nebulization BID  . atorvastatin  10 mg Oral q morning - 10a  . budesonide (PULMICORT) nebulizer solution  0.5 mg Nebulization BID  . calcium-vitamin D  1 tablet Oral q morning - 10a  . docusate sodium  100 mg Oral QHS  . folic acid  1 mg Oral q morning - 10a  . irbesartan  150 mg Oral Daily   And  . hydrochlorothiazide  12.5 mg Oral Daily  . HYDROcodone-homatropine  5 mL Oral QHS,MR X 1  . hydrocortisone   Rectal BID  . ipratropium  0.5 mg Nebulization TID  . iron polysaccharides  150 mg Oral Daily  . levalbuterol  0.63 mg  Nebulization TID  . loratadine  10 mg Oral QHS  . multivitamin with minerals  1 tablet Oral q morning - 10a  . predniSONE  50 mg Oral Q breakfast  . vitamin B-12  2,500 mcg Oral q morning - 10a   Continuous Infusions: . sodium chloride Stopped (05/19/18 2317)     LOS: 10 days    Time spent: 20 minutes   Dessa Phi, DO [facility] [email or web address] Password [facility] 05/22/2018, 12:57 PM

## 2018-05-22 NOTE — Care Management Important Message (Signed)
Important Message  Patient Details  Name: Casey Wilkinson MRN: [id number] Date of Birth: [birth year]   Medicare Important Message Given:  Yes    Kerin Salen 05/22/2018, 10:19 AMImportant Message  Patient Details  Name: Casey Wilkinson MRN: [id number] Date of Birth: [birth year]   Medicare Important Message Given:  Yes    Kerin Salen 05/22/2018, 10:19 AM

## 2018-05-23 DIAGNOSIS — Z882 Allergy status to sulfonamides status: Secondary | ICD-10-CM | POA: Diagnosis not present

## 2018-05-23 DIAGNOSIS — R531 Weakness: Secondary | ICD-10-CM | POA: Diagnosis not present

## 2018-05-23 DIAGNOSIS — I472 Ventricular tachycardia: Secondary | ICD-10-CM | POA: Diagnosis present

## 2018-05-23 DIAGNOSIS — Y95 Nosocomial condition: Secondary | ICD-10-CM | POA: Diagnosis present

## 2018-05-23 DIAGNOSIS — Z7189 Other specified counseling: Secondary | ICD-10-CM | POA: Diagnosis not present

## 2018-05-23 DIAGNOSIS — Z96652 Presence of left artificial knee joint: Secondary | ICD-10-CM | POA: Diagnosis present

## 2018-05-23 DIAGNOSIS — D649 Anemia, unspecified: Secondary | ICD-10-CM | POA: Diagnosis not present

## 2018-05-23 DIAGNOSIS — R042 Hemoptysis: Secondary | ICD-10-CM | POA: Diagnosis present

## 2018-05-23 DIAGNOSIS — R600 Localized edema: Secondary | ICD-10-CM | POA: Diagnosis not present

## 2018-05-23 DIAGNOSIS — L4050 Arthropathic psoriasis, unspecified: Secondary | ICD-10-CM | POA: Diagnosis present

## 2018-05-23 DIAGNOSIS — Z515 Encounter for palliative care: Secondary | ICD-10-CM | POA: Diagnosis present

## 2018-05-23 DIAGNOSIS — I9589 Other hypotension: Secondary | ICD-10-CM | POA: Diagnosis not present

## 2018-05-23 DIAGNOSIS — Z66 Do not resuscitate: Secondary | ICD-10-CM | POA: Diagnosis present

## 2018-05-23 DIAGNOSIS — Z7951 Long term (current) use of inhaled steroids: Secondary | ICD-10-CM | POA: Diagnosis not present

## 2018-05-23 DIAGNOSIS — I700 Atherosclerosis of aorta: Secondary | ICD-10-CM | POA: Diagnosis not present

## 2018-05-23 DIAGNOSIS — Z87891 Personal history of nicotine dependence: Secondary | ICD-10-CM | POA: Diagnosis not present

## 2018-05-23 DIAGNOSIS — R918 Other nonspecific abnormal finding of lung field: Secondary | ICD-10-CM | POA: Diagnosis not present

## 2018-05-23 DIAGNOSIS — M6281 Muscle weakness (generalized): Secondary | ICD-10-CM | POA: Diagnosis not present

## 2018-05-23 DIAGNOSIS — E785 Hyperlipidemia, unspecified: Secondary | ICD-10-CM | POA: Diagnosis not present

## 2018-05-23 DIAGNOSIS — R278 Other lack of coordination: Secondary | ICD-10-CM | POA: Diagnosis not present

## 2018-05-23 DIAGNOSIS — R05 Cough: Secondary | ICD-10-CM | POA: Diagnosis not present

## 2018-05-23 DIAGNOSIS — Z85828 Personal history of other malignant neoplasm of skin: Secondary | ICD-10-CM | POA: Diagnosis not present

## 2018-05-23 DIAGNOSIS — I10 Essential (primary) hypertension: Secondary | ICD-10-CM | POA: Diagnosis present

## 2018-05-23 DIAGNOSIS — R0602 Shortness of breath: Secondary | ICD-10-CM | POA: Diagnosis not present

## 2018-05-23 DIAGNOSIS — Z923 Personal history of irradiation: Secondary | ICD-10-CM | POA: Diagnosis not present

## 2018-05-23 DIAGNOSIS — Z881 Allergy status to other antibiotic agents status: Secondary | ICD-10-CM | POA: Diagnosis not present

## 2018-05-23 DIAGNOSIS — J439 Emphysema, unspecified: Secondary | ICD-10-CM | POA: Diagnosis not present

## 2018-05-23 DIAGNOSIS — J440 Chronic obstructive pulmonary disease with acute lower respiratory infection: Secondary | ICD-10-CM | POA: Diagnosis present

## 2018-05-23 DIAGNOSIS — D638 Anemia in other chronic diseases classified elsewhere: Secondary | ICD-10-CM | POA: Diagnosis not present

## 2018-05-23 DIAGNOSIS — Z888 Allergy status to other drugs, medicaments and biological substances status: Secondary | ICD-10-CM | POA: Diagnosis not present

## 2018-05-23 DIAGNOSIS — J90 Pleural effusion, not elsewhere classified: Secondary | ICD-10-CM | POA: Diagnosis not present

## 2018-05-23 DIAGNOSIS — D630 Anemia in neoplastic disease: Secondary | ICD-10-CM | POA: Diagnosis present

## 2018-05-23 DIAGNOSIS — C3431 Malignant neoplasm of lower lobe, right bronchus or lung: Secondary | ICD-10-CM | POA: Diagnosis present

## 2018-05-23 DIAGNOSIS — R000 Tachycardia, unspecified: Secondary | ICD-10-CM | POA: Diagnosis not present

## 2018-05-23 DIAGNOSIS — J441 Chronic obstructive pulmonary disease with (acute) exacerbation: Secondary | ICD-10-CM | POA: Diagnosis present

## 2018-05-23 DIAGNOSIS — J17 Pneumonia in diseases classified elsewhere: Secondary | ICD-10-CM | POA: Diagnosis not present

## 2018-05-23 DIAGNOSIS — J449 Chronic obstructive pulmonary disease, unspecified: Secondary | ICD-10-CM | POA: Diagnosis not present

## 2018-05-23 DIAGNOSIS — C3490 Malignant neoplasm of unspecified part of unspecified bronchus or lung: Secondary | ICD-10-CM | POA: Diagnosis not present

## 2018-05-23 DIAGNOSIS — Z85038 Personal history of other malignant neoplasm of large intestine: Secondary | ICD-10-CM | POA: Diagnosis not present

## 2018-05-23 DIAGNOSIS — Z9889 Other specified postprocedural states: Secondary | ICD-10-CM | POA: Diagnosis not present

## 2018-05-23 DIAGNOSIS — J9621 Acute and chronic respiratory failure with hypoxia: Secondary | ICD-10-CM | POA: Diagnosis present

## 2018-05-23 DIAGNOSIS — I2510 Atherosclerotic heart disease of native coronary artery without angina pectoris: Secondary | ICD-10-CM | POA: Diagnosis not present

## 2018-05-23 DIAGNOSIS — J181 Lobar pneumonia, unspecified organism: Secondary | ICD-10-CM | POA: Diagnosis not present

## 2018-05-23 DIAGNOSIS — J189 Pneumonia, unspecified organism: Secondary | ICD-10-CM | POA: Diagnosis present

## 2018-05-23 DIAGNOSIS — R0902 Hypoxemia: Secondary | ICD-10-CM | POA: Diagnosis not present

## 2018-05-23 DIAGNOSIS — J188 Other pneumonia, unspecified organism: Secondary | ICD-10-CM | POA: Diagnosis not present

## 2018-05-23 DIAGNOSIS — R0603 Acute respiratory distress: Secondary | ICD-10-CM | POA: Diagnosis not present

## 2018-05-23 DIAGNOSIS — Z9981 Dependence on supplemental oxygen: Secondary | ICD-10-CM | POA: Diagnosis not present

## 2018-05-23 DIAGNOSIS — D509 Iron deficiency anemia, unspecified: Secondary | ICD-10-CM | POA: Diagnosis not present

## 2018-05-23 DIAGNOSIS — C3491 Malignant neoplasm of unspecified part of right bronchus or lung: Secondary | ICD-10-CM | POA: Diagnosis not present

## 2018-05-23 DIAGNOSIS — R2689 Other abnormalities of gait and mobility: Secondary | ICD-10-CM | POA: Diagnosis not present

## 2018-05-23 DIAGNOSIS — Z510 Encounter for antineoplastic radiation therapy: Secondary | ICD-10-CM | POA: Diagnosis not present

## 2018-05-23 DIAGNOSIS — E876 Hypokalemia: Secondary | ICD-10-CM | POA: Diagnosis not present

## 2018-05-23 DIAGNOSIS — R091 Pleurisy: Secondary | ICD-10-CM | POA: Diagnosis not present

## 2018-05-23 DIAGNOSIS — Z79899 Other long term (current) drug therapy: Secondary | ICD-10-CM | POA: Diagnosis not present

## 2018-05-23 DIAGNOSIS — L930 Discoid lupus erythematosus: Secondary | ICD-10-CM | POA: Diagnosis present

## 2018-05-23 LAB — BASIC METABOLIC PANEL
ANION GAP: 7 (ref 5–15)
BUN: 25 mg/dL — ABNORMAL HIGH (ref 8–23)
CALCIUM: 8.2 mg/dL — AB (ref 8.9–10.3)
CO2: 29 mmol/L (ref 22–32)
Chloride: 100 mmol/L (ref 98–111)
Creatinine, Ser: 0.56 mg/dL (ref 0.44–1.00)
GFR calc Af Amer: >60 mL/min (ref >60)
GLUCOSE: 87 mg/dL (ref 70–99)
Potassium: 3.6 mmol/L (ref 3.5–5.1)
Sodium: 136 mmol/L (ref 135–145)

## 2018-05-23 LAB — CBC
HCT: 28.5 % — ABNORMAL LOW (ref 36.0–46.0)
Hemoglobin: 9.1 g/dL — ABNORMAL LOW (ref 12.0–15.0)
MCH: 29.2 pg (ref 26.0–34.0)
MCHC: 31.9 g/dL (ref 30.0–36.0)
MCV: 91.3 fL (ref 78.0–100.0)
Platelets: 197 10*3/uL (ref 150–400)
RBC: 3.12 MIL/uL — ABNORMAL LOW (ref 3.87–5.11)
RDW: 16.6 % — AB (ref 11.5–15.5)
WBC: 9.6 10*3/uL (ref 4.0–10.5)

## 2018-05-23 NOTE — Progress Notes (Signed)
Discharge teaching completed with teach back. Discharge instructions given and reviewed with pt. and family. Prescriptions given for Prednisone, Hycodan, and Norco. Family to take pt. to [facility]. Pt. left via wheelchair, oxygen at 4 Liters/nasal canula. No respiratory distress noted.

## 2018-05-23 NOTE — Clinical Social Work Placement (Signed)
Patient received and accepted bed offer at [facility]. Facility aware of patient's discharge and confirmed bed offer. Patient being transported to SNF by family, no packet needed. Patient's RN can call report to [phone]. CSW signing off, no other needs identified at this time.  CLINICAL SOCIAL WORK PLACEMENT  NOTE  Date:  05/23/2018  Patient Details  Name: Casey Wilkinson MRN: [id number] Date of Birth: 06-Jan-1941  Clinical Social Work is seeking post-discharge placement for this patient at the [place] level of care (*CSW will initial, date and re-position this form in  chart as items are completed):  Yes   Patient/family provided with [facility] Work Department's list of facilities offering this level of care within the geographic area requested by the patient (or if unable, by the patient's family).  Yes   Patient/family informed of their freedom to choose among providers that offer the needed level of care, that participate in Medicare, Medicaid or managed care program needed by the patient, have an available bed and are willing to accept the patient.  Yes   Patient/family informed of [facility]'s ownership interest in [facility] and [facility], as well as of the fact that they are under no obligation to receive care at these facilities.  PASRR submitted to EDS on       PASRR number received on       Existing PASRR number confirmed on 05/22/18     FL2 transmitted to all facilities in geographic area requested by pt/family on 05/22/18     FL2 transmitted to all facilities within larger geographic area on       Patient informed that his/her managed care company has contracts with or will negotiate with certain facilities, including the following:        Yes   Patient/family informed of bed offers received.  Patient chooses bed at [facility]     Physician recommends and patient chooses bed at      Patient to  be transferred to [facility] on 05/23/18.  Patient to be transferred to facility by Family     Patient family notified on 05/23/18 of transfer.  Name of family member notified:  Patient is being transported by family     PHYSICIAN       Additional Comment:    _______________________________________________ Burnis Medin, LCSW 05/23/2018, 11:38 AM

## 2018-05-23 NOTE — Progress Notes (Signed)
Report called to Nia RN at [facility].

## 2018-05-23 NOTE — Discharge Summary (Signed)
Physician Discharge Summary  Casey Wilkinson KGU:[id number] DOB: [birth year] DOA: 05/12/2018  PCP: Shon Baton, MD  Admit date: 05/12/2018 Discharge date: 05/23/2018  Admitted From: Home Disposition:  SNF   Recommendations for Outpatient Follow-up:  1. Follow up with PCP in 1 week 2. Follow up with Dr. Marin Olp in 1 week   Discharge Condition: Stable CODE STATUS: Full  Diet recommendation: Heart healthy   2-4L O2 as needed to maintain sat > 92%   Brief/Interim Summary: Casey Wilkinson a 77 year old female with medical history significant for COPD on 2 L oxygen at home, hypertension, discoid lupus and recently diagnosed with adenocarcinoma right lung. Patient was recently discharged on 05/09/2018 after being admitted for pneumonia, underwent bronchoscopy and found to have adenocarcinoma of the right lung. Patient was discharged on antibiotic therapy, however over the past 3 days she has become more short of breath especially with exertion and was dropping her saturations and decided to come to the emergency room. Upon ED evaluation CT of the chest showed persistent right lower lobe collapse/infiltrate and small right pleural effusion patient was started on empiric antibiotic and received steroids. She was admitted with working diagnosis of post obstructive pneumonia.  Discharge Diagnoses:  Principal Problem:   Acute on chronic respiratory failure with hypoxia (HCC) Active Problems:   COPD (chronic obstructive pulmonary disease) (HCC)   HTN (hypertension)   Adenocarcinoma of right lung (HCC)   HCAP (healthcare-associated pneumonia)   Acute on chronic respiratory failure with hypoxia due to postobstructive pneumonia/adenocarcinoma of the right lower lobe -Requires 2L Casey Wilkinson at baseline, currently on 4L [place]. Continue to wean as able, maintain sat > 92%. May titrate up as needed with exertion  -Blood cultures negative -Respiratory culture normal flora  -Initially started on cefepime and  vancomycin for postobstructive/HCAP PNA, then switched to Levaquin. Completed levaquin total 10 days antibiotic therapy.  -Continue prednisone and wean as able   Leukocytosis -Likely due to steroids. No fevers.    COPD  -Continue Brovana and Pulmicort twice daily, DuoNeb's as needed   Hypertension -Continue avapro-HCTZ   Adenocarcinoma of right lung -Oncology was consulted, recommending radiation oncology -CT head, A/P shows no signs of mets  -Radiation oncology for radiation tx   Anemia of chronic disease/Iron deficiency -Shehas beentransfused 1 unit of PRBC during hospital stay, 1 dose of IV iron also was given -Hgb remains stable  HLD -Continue lipitor     Discharge Instructions  Discharge Instructions    Diet - low sodium heart healthy   Complete by:  As directed    Increase activity slowly   Complete by:  As directed      Allergies as of 05/23/2018      Reactions   Prochlorperazine Edisylate Nausea Only, Other (See Comments)   **COMPAZINE**   Stroke-like symptoms   Sulfamethoxazole Nausea Only, Other (See Comments)   Pt taking Methotrexate, Sulfur drugs could cause SEVERE fatal reaction.   Doxycycline Hives      Medication List    STOP taking these medications   levofloxacin 500 MG tablet Commonly known as:  LEVAQUIN     TAKE these medications   ABC PLUS SENIOR Tabs Take 1 tablet by mouth every morning.   acetaminophen 650 MG CR tablet Commonly known as:  TYLENOL Take 1,300 mg by mouth every 8 (eight) hours as needed for pain.   albuterol 108 (90 Base) MCG/ACT inhaler Commonly known as:  PROAIR HFA Inhale 2 puffs into the lungs every 4 (four) hours as  needed for wheezing or shortness of breath.   atorvastatin 10 MG tablet Commonly known as:  LIPITOR Take 10 mg by mouth every morning.   Calcium Carbonate-Vitamin D 600-400 MG-UNIT tablet Take 1 tablet by mouth every morning.   docusate sodium 100 MG capsule Commonly known as:   COLACE Take 100 mg by mouth at bedtime.   folic acid 1 MG tablet Commonly known as:  FOLVITE Take 1 mg by mouth every morning.   HYDROcodone-acetaminophen 5-325 MG tablet Commonly known as:  NORCO/VICODIN Take 1 tablet by mouth every 6 (six) hours as needed for up to 3 days for severe pain.   HYDROcodone-homatropine 5-1.5 MG/5ML syrup Commonly known as:  HYCODAN Take 5 mLs by mouth at bedtime and may repeat dose one time if needed.   Loratadine 5 MG/5ML Soln Take 10 mLs by mouth at bedtime.   POLY-IRON 150 150 MG capsule Generic drug:  iron polysaccharides Take 150mg  by mouth once daily   polyethylene glycol packet Commonly known as:  MIRALAX / GLYCOLAX Take 17 g by mouth daily as needed for mild constipation.   Potassium 99 MG Tabs Take 99 mg by mouth daily.   predniSONE 10 MG tablet Commonly known as:  DELTASONE Take 4 tabs for 3 days, then 3 tabs for 3 days, then 2 tabs for 3 days, then 1 tab for 3 days, then 1/2 tab for 4 days.   TRELEGY ELLIPTA 100-62.5-25 MCG/INH Aepb Generic drug:  Fluticasone-Umeclidin-Vilant USE 1 INHALATION DAILY   valsartan-hydrochlorothiazide 160-12.5 MG tablet Commonly known as:  DIOVAN-HCT Take 1 tablet by mouth every morning.   vitamin B-12 500 MCG tablet Commonly known as:  CYANOCOBALAMIN Take 2,500 mcg by mouth every morning.      Follow-up Information    [facility].   Specialty:  Home Health Services Contact information: [address] [place] [address] [phone]        Shon Baton, MD. Schedule an appointment as soon as possible for a visit in 1 week(s).   Specialty:  Internal Medicine Contact information: [place] [address] [phone]        Volanda Napoleon, MD. Schedule an appointment as soon as possible for a visit in 1 week(s).   Specialty:  Oncology Contact information: [place] [address] [phone]           Allergies  Allergen Reactions  . Prochlorperazine Edisylate Nausea Only and Other (See Comments)    **COMPAZINE**   Stroke-like symptoms  . Sulfamethoxazole Nausea Only and Other (See Comments)    Pt taking Methotrexate, Sulfur drugs could cause SEVERE fatal reaction.  . Doxycycline Hives    Consultations:  Oncology  Radiation oncology    Procedures/Studies: Dg Chest 2 View  Result Date: 05/12/2018 CLINICAL DATA:  Shortness of breath. Diagnosed with lung cancer 4 days ago. History of colon cancer. EXAM: CHEST - 2 VIEW COMPARISON:  Chest radiograph May 06, 2018 FINDINGS: Persistent RIGHT lung base consolidation. Increasing lobulated moderate RIGHT, small LEFT pleural effusions. Cardiac silhouette is upper limits of normal size. Calcified aortic arch. Increased lung volumes with mild apical bullous changes suspected. No pneumothorax. Soft tissue planes and included osseous structures are non suspicious. IMPRESSION: Increasing moderate RIGHT and small LEFT pleural effusions. Persistent consolidation RIGHT lower lobe. Aortic Atherosclerosis (ICD10-I70.0). Emphysema (ICD10-J43.9). Electronically Signed   By: Elon Alas M.D.   On: 05/12/2018 17:52   Dg Chest 2 View  Result Date: 04/30/2018 CLINICAL DATA:  77 year old female with dense right lower lobe consolidation/pneumonia. Subsequent encounter. EXAM: CHEST - 2 VIEW COMPARISON:  Chest CT 04/22/2018 and earlier. FINDINGS: Compared to radiographs on 02/26/2018, confluent right lung base opacity has minimally improved. Lung parenchyma elsewhere remains stable and clear. Upper lobe emphysema again evident. Stable cardiac size and mediastinal contours. Visualized tracheal air column is within normal limits. No pneumothorax. Absence of right pleural effusion better demonstrated on the recent CT. Osteopenia. Calcified aortic atherosclerosis. Negative visible bowel gas pattern. IMPRESSION: 1. Continued dense right lung base  consolidation. No significant improvement since 02/26/2018. 2. No new cardiopulmonary abnormality.  Emphysema. Electronically Signed   By: Genevie Ann M.D.   On: 04/30/2018 15:06   Ct Head W & Wo Contrast  Result Date: 05/18/2018 CLINICAL DATA:  Non-small-cell lung cancer staging. History of colon cancer. EXAM: CT HEAD WITHOUT AND WITH CONTRAST TECHNIQUE: Contiguous axial images were obtained from the base of the skull through the vertex without and with intravenous contrast CONTRAST:  62mL OMNIPAQUE IOHEXOL 300 MG/ML  SOLN COMPARISON:  None. FINDINGS: Brain: Moderate atrophy. Extensive white matter hypodensity diffusely and symmetrically. Negative for acute infarct. Negative for hemorrhage or mass. No enhancing metastatic deposits postcontrast administration Vascular: Negative for hyperdense vessel. Atherosclerotic calcification. Skull: Negative Sinuses/Orbits: Paranasal sinuses clear.  Bilateral cataract surgery Other: None IMPRESSION: Negative for metastatic disease Atrophy and extensive chronic microvascular ischemic change in the white matter. No acute abnormality. Electronically Signed   By: Franchot Gallo M.D.   On: 05/18/2018 15:08   Ct Angio Chest Pe W/cm &/or Wo Cm  Result Date: 05/12/2018 CLINICAL DATA:  Short of breath.  History of lung cancer EXAM: CT ANGIOGRAPHY CHEST WITH CONTRAST TECHNIQUE: Multidetector CT imaging of the chest was performed using the standard protocol during bolus administration of intravenous contrast. Multiplanar CT image reconstructions and MIPs were obtained to evaluate the vascular anatomy. CONTRAST:  138mL ISOVUE-370 IOPAMIDOL (ISOVUE-370) INJECTION 76% COMPARISON:  Chest CT 04/22/2018 FINDINGS: Cardiovascular: Negative for pulmonary embolism. Pulmonary arteries normal in caliber. Atherosclerotic calcification aortic arch without aneurysm or dissection. Heart size within normal limits. Mediastinum/Nodes: Negative for mass or adenopathy Lungs/Pleura: Persistent right lower  lobe collapse with small right effusion. No obstructing mass lesion. Recent biopsy positive for right lower low adenocarcinoma. Severe emphysema. Left lower lobe scarring. No effusion on the left. Upper Abdomen: Negative Musculoskeletal: Multilevel degenerative change. No acute skeletal abnormality. Review of the MIP images confirms the above findings. IMPRESSION: Negative for pulmonary embolism Persistent right lower lobe collapse and small right effusion. Aortic Atherosclerosis (ICD10-I70.0) and Emphysema (ICD10-J43.9). Electronically Signed   By: Franchot Gallo M.D.   On: 05/12/2018 20:09   Ct Abdomen Pelvis W Contrast  Result Date: 05/18/2018 CLINICAL DATA:  Restaging colon cancer. EXAM: CT ABDOMEN AND PELVIS WITH CONTRAST TECHNIQUE: Multidetector CT imaging of the abdomen and pelvis was performed using the standard protocol following bolus administration of intravenous contrast. CONTRAST:  76mL OMNIPAQUE IOHEXOL 300 MG/ML  SOLN COMPARISON:  02/06/2015 CT scan. FINDINGS: Lower chest: Persistent dense airspace consolidation in the right lower lobe with air bronchograms. This is unchanged when compared to prior chest CTs from 2019. There is also a right-sided pleural effusion which appears stable when compared to the most recent chest CT from 07/23. Stable emphysematous changes. No worrisome pulmonary lesions. The heart is normal in size. Stable aortic and coronary artery calcifications. Hepatobiliary: No focal hepatic lesions to suggest metastatic disease. The liver contour is slightly irregular and the  hepatic fissures are prominent. Findings could suggest cirrhosis. The gallbladder is contracted. No common bile duct dilatation. Pancreas: No mass, inflammation or ductal dilatation. Spleen: Normal size.  No focal lesions. Adrenals/Urinary Tract: Stable small adrenal gland nodule on the left. This is likely a benign adenoma. No renal lesions or hydronephrosis.  The bladder is normal. Stomach/Bowel: The  stomach, duodenum, small bowel and colon are unremarkable. No acute inflammatory process, mass lesions or obstructive findings. Stable surgical changes from a partial right colectomy. No findings worrisome for recurrent tumor. Moderate stool throughout the colon and down into the rectum may suggest constipation. Vascular/Lymphatic: Advanced atherosclerotic calcifications involving the aorta and iliac arteries and branch vessels. No aneurysm or dissection. No mesenteric or retroperitoneal mass or adenopathy. Reproductive: Surgically absent. Other: No pelvic mass or adenopathy. No free pelvic fluid collections. No inguinal mass or adenopathy. No abdominal wall hernia or subcutaneous lesions. Small left inguinal hernia containing fat. Small periumbilical abdominal wall hernia on the right side containing only fat. Musculoskeletal: No significant bony findings. Advanced degenerate disc disease noted at L2-3 and L5-S1. IMPRESSION: 1. Stable surgical changes from a partial right hemicolectomy. The ileocolonic anastomosis is unremarkable. No findings suspicious for recurrent tumor, locoregional adenopathy or metastatic disease elsewhere. 2. Stable dense airspace consolidation in the right lower lobe along with a right pleural effusion. 3. Advanced atherosclerotic calcifications involving the aorta, iliac arteries and branch vessels. 4. Small periumbilical abdominal wall hernia and left inguinal hernia. 5. Stool throughout the colon and down into the rectum could suggest constipation. 6. Stable small left adrenal gland nodule, likely benign adenoma. Electronically Signed   By: Marijo Sanes M.D.   On: 05/18/2018 15:52   Dg Chest Port 1 View  Result Date: 05/15/2018 CLINICAL DATA:  Shortness of breath EXAM: PORTABLE CHEST 1 VIEW COMPARISON:  05/12/2018 FINDINGS: Cardiac shadow is stable. Right pleural effusion is again noted with right basilar infiltrate. Small left pleural effusion is seen as well. No bony abnormality  is noted. IMPRESSION: The overall appearance is similar to that seen on recent CT examination with persistent predominant right basilar changes identified. Electronically Signed   By: Inez Catalina M.D.   On: 05/15/2018 12:18   Dg Chest Portable 1 View  Result Date: 05/06/2018 CLINICAL DATA:  Chest pain EXAM: PORTABLE CHEST 1 VIEW COMPARISON:  05/06/2018 at 2:42 p.m. FINDINGS: Right lower lobe consolidation, stable. Atherosclerotic calcification of the aortic arch. Heart size within normal limits. Tapering of the peripheral pulmonary vasculature favors emphysema. IMPRESSION: 1. Stable consolidation in the right lower lobe. 2. Aortic Atherosclerosis (ICD10-I70.0) and Emphysema (ICD10-J43.9). Electronically Signed   By: Van Clines M.D.   On: 05/06/2018 22:11   Dg Chest Port 1 View  Result Date: 05/06/2018 CLINICAL DATA:  Followup bronchoscopy and biopsy. EXAM: PORTABLE CHEST 1 VIEW COMPARISON:  04/30/2018 FINDINGS: No pneumothorax or hemothorax. Emphysema and pulmonary scarring. Left lung remains clear. Worsened opacity in the right lower lung which could be due to lavage or hemorrhage. IMPRESSION: Worsened opacity in the right lower lung which could be due to lavage or postprocedure hemorrhage. Background emphysema and scarring. Electronically Signed   By: Nelson Chimes M.D.   On: 05/06/2018 15:17   Dg C-arm Bronchoscopy  Result Date: 05/06/2018 C-ARM BRONCHOSCOPY: Fluoroscopy was utilized by the requesting physician.  No radiographic interpretation.      Discharge Exam: Vitals:   05/23/18 0751 05/23/18 0805  BP:    Pulse:    Resp:    Temp:  SpO2: 94% 94%    General: Pt is alert, awake, not in acute distress Cardiovascular: RRR, S1/S2 +, no rubs, no gallops Respiratory: Diminished right base, some crackles throughout, no wheezing, no rhonchi Abdominal: Soft, NT, ND, bowel sounds + Extremities: no edema, no cyanosis    The results of significant diagnostics from this  hospitalization (including imaging, microbiology, ancillary and laboratory) are listed below for reference.     Microbiology: No results found for this or any previous visit (from the past 240 hour(s)).   Labs: BNP (last 3 results) Recent Labs    05/12/18 1838  BNP 16.1   Basic Metabolic Panel: Recent Labs  Lab 05/17/18 0436 05/20/18 0433 05/21/18 0353 05/22/18 0346 05/23/18 0431  NA 149* 137 138 136 136  K 4.5 4.1 3.7 3.9 3.6  CL 106 100 100 99 100  CO2 32 31 31 32 29  GLUCOSE 127* 91 83 92 87  BUN 24* 26* 24* 24* 25*  CREATININE 0.56 0.58 0.53 0.58 0.56  CALCIUM 9.3 8.8* 8.3* 8.5* 8.2*  MG 2.1 2.2  --   --   --   PHOS  --  2.4*  --   --   --    Liver Function Tests: No results for input(s): AST, ALT, ALKPHOS, BILITOT, PROT, ALBUMIN in the last 168 hours. No results for input(s): LIPASE, AMYLASE in the last 168 hours. No results for input(s): AMMONIA in the last 168 hours. CBC: Recent Labs  Lab 05/17/18 0436 05/19/18 0430 05/20/18 0433 05/21/18 0353 05/22/18 0346 05/23/18 0431  WBC 8.4 12.8* 13.8* 10.5 12.7* 9.6  NEUTROABS 7.6 11.7*  --   --   --   --   HGB 9.6* 10.0* 10.4* 9.7* 9.5* 9.1*  HCT 30.1* 31.7* 32.8* 30.7* 29.7* 28.5*  MCV 90.7 92.2 92.1 92.7 91.7 91.3  PLT 306 325 317 249 231 197   Cardiac Enzymes: No results for input(s): CKTOTAL, CKMB, CKMBINDEX, TROPONINI in the last 168 hours. BNP: Invalid input(s): POCBNP CBG: No results for input(s): GLUCAP in the last 168 hours. D-Dimer No results for input(s): DDIMER in the last 72 hours. Hgb A1c No results for input(s): HGBA1C in the last 72 hours. Lipid Profile No results for input(s): CHOL, HDL, LDLCALC, TRIG, CHOLHDL, LDLDIRECT in the last 72 hours. Thyroid function studies No results for input(s): TSH, T4TOTAL, T3FREE, THYROIDAB in the last 72 hours.  Invalid input(s): FREET3 Anemia work up No results for input(s): VITAMINB12, FOLATE, FERRITIN, TIBC, IRON, RETICCTPCT in the last 72  hours. Urinalysis    Component Value Date/Time   COLORURINE YELLOW 05/07/2018 0521   APPEARANCEUR CLEAR 05/07/2018 0521   LABSPEC 1.010 05/07/2018 0521   PHURINE 5.0 05/07/2018 0521   GLUCOSEU NEGATIVE 05/07/2018 0521   HGBUR NEGATIVE 05/07/2018 0521   BILIRUBINUR NEGATIVE 05/07/2018 0521   KETONESUR 5 (A) 05/07/2018 0521   PROTEINUR NEGATIVE 05/07/2018 0521   NITRITE NEGATIVE 05/07/2018 0521   LEUKOCYTESUR NEGATIVE 05/07/2018 0521   Sepsis Labs Invalid input(s): PROCALCITONIN,  WBC,  LACTICIDVEN Microbiology No results found for this or any previous visit (from the past 240 hour(s)).   Patient was seen and examined on the day of discharge and was found to be in stable condition. Time coordinating discharge: 35 minutes including assessment and coordination of care, as well as examination of the patient.   SIGNED:  Dessa Phi, DO [facility] Pager [phone]  If 7PM-7AM, please contact night-coverage [email or web address] Password [email or web address] 05/23/2018, 9:05 AM

## 2018-05-25 ENCOUNTER — Ambulatory Visit
Admission: RE | Admit: 2018-05-25 | Discharge: 2018-05-25 | Disposition: A | Discharge status: Home / Self Care | Payer: Medicare Other | Source: Ambulatory Visit | Attending: Radiation Oncology | Admitting: Radiation Oncology

## 2018-05-25 ENCOUNTER — Other Ambulatory Visit: Payer: Self-pay | Admitting: *Deleted

## 2018-05-25 DIAGNOSIS — D638 Anemia in other chronic diseases classified elsewhere: Secondary | ICD-10-CM | POA: Diagnosis not present

## 2018-05-25 DIAGNOSIS — J441 Chronic obstructive pulmonary disease with (acute) exacerbation: Secondary | ICD-10-CM

## 2018-05-25 DIAGNOSIS — J17 Pneumonia in diseases classified elsewhere: Secondary | ICD-10-CM | POA: Diagnosis not present

## 2018-05-25 DIAGNOSIS — C3431 Malignant neoplasm of lower lobe, right bronchus or lung: Secondary | ICD-10-CM | POA: Diagnosis not present

## 2018-05-25 DIAGNOSIS — Z510 Encounter for antineoplastic radiation therapy: Secondary | ICD-10-CM | POA: Diagnosis not present

## 2018-05-25 DIAGNOSIS — I10 Essential (primary) hypertension: Secondary | ICD-10-CM | POA: Diagnosis not present

## 2018-05-25 NOTE — Consult Note (Signed)
[facility] Care Management follow up.  Chart reviewed. Noted Mrs.Vint discharged to [facility] over the weekend. Will make referral to [facility] for follow up while at [facility].  Marthenia Rolling, MSN-Ed, RN,BSN [facility] Liaison [phone]

## 2018-05-26 ENCOUNTER — Ambulatory Visit
Admission: RE | Admit: 2018-05-26 | Discharge: 2018-05-26 | Disposition: A | Discharge status: Home / Self Care | Payer: Medicare Other | Source: Ambulatory Visit | Attending: Radiation Oncology | Admitting: Radiation Oncology

## 2018-05-26 DIAGNOSIS — C3431 Malignant neoplasm of lower lobe, right bronchus or lung: Secondary | ICD-10-CM | POA: Diagnosis not present

## 2018-05-26 DIAGNOSIS — Z510 Encounter for antineoplastic radiation therapy: Secondary | ICD-10-CM | POA: Diagnosis not present

## 2018-05-27 ENCOUNTER — Telehealth: Payer: Self-pay | Admitting: *Deleted

## 2018-05-27 ENCOUNTER — Ambulatory Visit
Admission: RE | Admit: 2018-05-27 | Discharge: 2018-05-27 | Disposition: A | Discharge status: Home / Self Care | Payer: Medicare Other | Source: Ambulatory Visit | Attending: Radiation Oncology | Admitting: Radiation Oncology

## 2018-05-27 ENCOUNTER — Encounter (HOSPITAL_COMMUNITY): Payer: Self-pay | Admitting: Internal Medicine

## 2018-05-27 DIAGNOSIS — C3431 Malignant neoplasm of lower lobe, right bronchus or lung: Secondary | ICD-10-CM | POA: Diagnosis not present

## 2018-05-27 DIAGNOSIS — Z510 Encounter for antineoplastic radiation therapy: Secondary | ICD-10-CM | POA: Diagnosis not present

## 2018-05-27 DIAGNOSIS — C3491 Malignant neoplasm of unspecified part of right bronchus or lung: Secondary | ICD-10-CM

## 2018-05-27 NOTE — Telephone Encounter (Signed)
Oncology Nurse Navigator Documentation  Oncology Nurse Navigator Flowsheets 05/27/2018  Navigator Location [facility]  Referral date to RadOnc/MedOnc 05/27/2018  Navigator Encounter Type Telephone/I received referral on Casey Wilkinson to see Dr. Julien Nordmann.  I updated him on referral.  I called Mr. Sires and updated her on appt time and place. She verbalized understanding.   Telephone Outgoing Call  Treatment Phase Treatment  Barriers/Navigation Needs Education;Coordination of Care  Education Other  Interventions Coordination of Care;Education  Coordination of Care Appts  Education Method Verbal  Acuity Level 2  Time Spent with Patient 30

## 2018-05-28 ENCOUNTER — Other Ambulatory Visit: Payer: Self-pay | Admitting: *Deleted

## 2018-05-28 ENCOUNTER — Ambulatory Visit: Payer: Medicare Other | Admitting: Internal Medicine

## 2018-05-28 ENCOUNTER — Ambulatory Visit
Admission: RE | Admit: 2018-05-28 | Discharge: 2018-05-28 | Disposition: A | Discharge status: Home / Self Care | Payer: Medicare Other | Source: Ambulatory Visit | Attending: Radiation Oncology | Admitting: Radiation Oncology

## 2018-05-28 ENCOUNTER — Other Ambulatory Visit: Payer: Medicare Other

## 2018-05-28 DIAGNOSIS — Z510 Encounter for antineoplastic radiation therapy: Secondary | ICD-10-CM | POA: Diagnosis not present

## 2018-05-28 DIAGNOSIS — C3431 Malignant neoplasm of lower lobe, right bronchus or lung: Secondary | ICD-10-CM | POA: Diagnosis not present

## 2018-05-28 NOTE — Patient Outreach (Signed)
[place] [facility]) Care Management  05/28/2018  AZARIE CORIZ 04-May-1941 [id number]   CSW received referral from [place], Orrin Brigham that patient is considered High Risk for readmission and discharged to [facility] on 05/23/18. CSW met with patient & her son, Lennette Bihari at bedside to discuss discharge plans. Patient reports that she plans to return home with her 78 year old husband of 52 years who is able to care for her, drive her to doctors appointments. Patient reports that they order their groceries online through [employer] and just pickup an have been very happy with that service. Patient states that though she is misses her husband, she is very pleased with the care & therapy she has been getting at [facility] will check back with patient in 2 weeks.    [name], LCSW [facility]  Clinical Social Worker cell #: [phone]

## 2018-05-29 ENCOUNTER — Ambulatory Visit
Admission: RE | Admit: 2018-05-29 | Discharge: 2018-05-29 | Disposition: A | Discharge status: Home / Self Care | Payer: Medicare Other | Source: Ambulatory Visit | Attending: Radiation Oncology | Admitting: Radiation Oncology

## 2018-05-29 DIAGNOSIS — R531 Weakness: Secondary | ICD-10-CM | POA: Diagnosis not present

## 2018-05-29 DIAGNOSIS — C3431 Malignant neoplasm of lower lobe, right bronchus or lung: Secondary | ICD-10-CM | POA: Diagnosis not present

## 2018-05-29 DIAGNOSIS — L930 Discoid lupus erythematosus: Secondary | ICD-10-CM | POA: Diagnosis not present

## 2018-05-29 DIAGNOSIS — J9621 Acute and chronic respiratory failure with hypoxia: Secondary | ICD-10-CM | POA: Diagnosis not present

## 2018-05-29 DIAGNOSIS — J441 Chronic obstructive pulmonary disease with (acute) exacerbation: Secondary | ICD-10-CM | POA: Diagnosis not present

## 2018-05-29 DIAGNOSIS — C3490 Malignant neoplasm of unspecified part of unspecified bronchus or lung: Secondary | ICD-10-CM | POA: Diagnosis not present

## 2018-05-29 DIAGNOSIS — Z510 Encounter for antineoplastic radiation therapy: Secondary | ICD-10-CM | POA: Diagnosis not present

## 2018-05-29 DIAGNOSIS — I10 Essential (primary) hypertension: Secondary | ICD-10-CM | POA: Diagnosis not present

## 2018-06-01 ENCOUNTER — Ambulatory Visit
Admission: RE | Admit: 2018-06-01 | Discharge: 2018-06-01 | Disposition: A | Discharge status: Home / Self Care | Payer: Medicare Other | Source: Ambulatory Visit | Attending: Radiation Oncology | Admitting: Radiation Oncology

## 2018-06-01 DIAGNOSIS — C3491 Malignant neoplasm of unspecified part of right bronchus or lung: Secondary | ICD-10-CM

## 2018-06-01 DIAGNOSIS — Z510 Encounter for antineoplastic radiation therapy: Secondary | ICD-10-CM | POA: Diagnosis not present

## 2018-06-01 DIAGNOSIS — C3431 Malignant neoplasm of lower lobe, right bronchus or lung: Secondary | ICD-10-CM | POA: Diagnosis not present

## 2018-06-01 MED ORDER — RADIAPLEXRX EX GEL
Freq: Once | CUTANEOUS | Status: AC
  Administered 2018-06-01: 14:00:00 via TOPICAL

## 2018-06-02 ENCOUNTER — Ambulatory Visit
Admission: RE | Admit: 2018-06-02 | Discharge: 2018-06-02 | Disposition: A | Discharge status: Home / Self Care | Payer: Medicare Other | Source: Ambulatory Visit | Attending: Radiation Oncology | Admitting: Radiation Oncology

## 2018-06-02 DIAGNOSIS — Z510 Encounter for antineoplastic radiation therapy: Secondary | ICD-10-CM | POA: Diagnosis not present

## 2018-06-02 DIAGNOSIS — C3431 Malignant neoplasm of lower lobe, right bronchus or lung: Secondary | ICD-10-CM | POA: Diagnosis not present

## 2018-06-03 ENCOUNTER — Other Ambulatory Visit: Payer: Self-pay | Admitting: Radiation Oncology

## 2018-06-03 ENCOUNTER — Ambulatory Visit
Admission: RE | Admit: 2018-06-03 | Discharge: 2018-06-03 | Disposition: A | Discharge status: Home / Self Care | Payer: Medicare Other | Source: Ambulatory Visit | Attending: Radiation Oncology | Admitting: Radiation Oncology

## 2018-06-03 DIAGNOSIS — Z510 Encounter for antineoplastic radiation therapy: Secondary | ICD-10-CM | POA: Diagnosis not present

## 2018-06-03 DIAGNOSIS — C3491 Malignant neoplasm of unspecified part of right bronchus or lung: Secondary | ICD-10-CM

## 2018-06-03 DIAGNOSIS — C3431 Malignant neoplasm of lower lobe, right bronchus or lung: Secondary | ICD-10-CM | POA: Diagnosis not present

## 2018-06-03 MED ORDER — LIDOCAINE VISCOUS HCL 2 % MT SOLN: OROMUCOSAL | 2 refills | Status: AC

## 2018-06-04 ENCOUNTER — Other Ambulatory Visit: Payer: Self-pay | Admitting: *Deleted

## 2018-06-04 LAB — FUNGUS CULTURE WITH STAIN

## 2018-06-04 LAB — FUNGUS CULTURE RESULT

## 2018-06-04 LAB — FUNGAL ORGANISM REFLEX

## 2018-06-04 NOTE — Patient Outreach (Signed)
[facility]) Care Management  06/04/2018  Casey Wilkinson 1940/11/16 [id number]  Collaboration with [facility], [name], RN, to let her know this patient is active with [facility] care management. While admitted to [facility], she is being followed by [facility] LCSW, [name].  She reports patient does not have a discharge date as of yet.  Plan to sign off.  [name]. [name], RN, BSN, [place] [phone]) Business Cell  [phone]) Toll Free Office

## 2018-06-05 ENCOUNTER — Inpatient Hospital Stay: Payer: Medicare Other | Attending: Internal Medicine

## 2018-06-05 ENCOUNTER — Telehealth: Payer: Self-pay | Admitting: Internal Medicine

## 2018-06-05 ENCOUNTER — Inpatient Hospital Stay (HOSPITAL_BASED_OUTPATIENT_CLINIC_OR_DEPARTMENT_OTHER): Payer: Medicare Other | Admitting: Internal Medicine

## 2018-06-05 ENCOUNTER — Encounter: Payer: Self-pay | Admitting: Internal Medicine

## 2018-06-05 VITALS — BP 128/60 | HR 119 | Temp 101.3°F | Resp 18 | Ht 65.0 in | Wt 156.7 lb

## 2018-06-05 DIAGNOSIS — R042 Hemoptysis: Secondary | ICD-10-CM

## 2018-06-05 DIAGNOSIS — C3491 Malignant neoplasm of unspecified part of right bronchus or lung: Secondary | ICD-10-CM

## 2018-06-05 DIAGNOSIS — I10 Essential (primary) hypertension: Secondary | ICD-10-CM | POA: Diagnosis not present

## 2018-06-05 DIAGNOSIS — C3490 Malignant neoplasm of unspecified part of unspecified bronchus or lung: Secondary | ICD-10-CM

## 2018-06-05 DIAGNOSIS — Z923 Personal history of irradiation: Secondary | ICD-10-CM

## 2018-06-05 DIAGNOSIS — Z79899 Other long term (current) drug therapy: Secondary | ICD-10-CM

## 2018-06-05 DIAGNOSIS — I2510 Atherosclerotic heart disease of native coronary artery without angina pectoris: Secondary | ICD-10-CM | POA: Insufficient documentation

## 2018-06-05 DIAGNOSIS — Z85038 Personal history of other malignant neoplasm of large intestine: Secondary | ICD-10-CM | POA: Insufficient documentation

## 2018-06-05 DIAGNOSIS — J449 Chronic obstructive pulmonary disease, unspecified: Secondary | ICD-10-CM | POA: Insufficient documentation

## 2018-06-05 DIAGNOSIS — C3431 Malignant neoplasm of lower lobe, right bronchus or lung: Secondary | ICD-10-CM | POA: Diagnosis not present

## 2018-06-05 DIAGNOSIS — L930 Discoid lupus erythematosus: Secondary | ICD-10-CM | POA: Insufficient documentation

## 2018-06-05 DIAGNOSIS — Z7189 Other specified counseling: Secondary | ICD-10-CM

## 2018-06-05 LAB — CMP (CANCER CENTER ONLY)
ALBUMIN: 2.6 g/dL — AB (ref 3.5–5.0)
ALT: 13 U/L (ref 0–44)
AST: 18 U/L (ref 15–41)
Alkaline Phosphatase: 112 U/L (ref 38–126)
Anion gap: 7 (ref 5–15)
BILIRUBIN TOTAL: 0.6 mg/dL (ref 0.3–1.2)
BUN: 16 mg/dL (ref 8–23)
CHLORIDE: 96 mmol/L — AB (ref 98–111)
CO2: 30 mmol/L (ref 22–32)
CREATININE: 0.62 mg/dL (ref 0.44–1.00)
Calcium: 8.8 mg/dL — ABNORMAL LOW (ref 8.9–10.3)
GFR, Est AFR Am: >60 mL/min (ref >60)
GFR, Estimated: >60 mL/min (ref >60)
GLUCOSE: 84 mg/dL (ref 70–99)
POTASSIUM: 4 mmol/L (ref 3.5–5.1)
Sodium: 133 mmol/L — ABNORMAL LOW (ref 135–145)
Total Protein: 6.8 g/dL (ref 6.5–8.1)

## 2018-06-05 LAB — CBC WITH DIFFERENTIAL (CANCER CENTER ONLY)
Basophils Absolute: 0 10*3/uL (ref 0.0–0.1)
Basophils Relative: 0 %
EOS ABS: 0.1 10*3/uL (ref 0.0–0.5)
EOS PCT: 3 %
HCT: 26.6 % — ABNORMAL LOW (ref 34.8–46.6)
Hemoglobin: 8.5 g/dL — ABNORMAL LOW (ref 11.6–15.9)
LYMPHS ABS: 0.2 10*3/uL — AB (ref 0.9–3.3)
LYMPHS PCT: 5 %
MCH: 29.4 pg (ref 25.1–34.0)
MCHC: 32 g/dL (ref 31.5–36.0)
MCV: 92 fL (ref 79.5–101.0)
MONO ABS: 0.2 10*3/uL (ref 0.1–0.9)
Monocytes Relative: 5 %
Neutro Abs: 3.9 10*3/uL (ref 1.5–6.5)
Neutrophils Relative %: 87 %
PLATELETS: 189 10*3/uL (ref 145–400)
RBC: 2.89 MIL/uL — ABNORMAL LOW (ref 3.70–5.45)
RDW: 17.3 % — AB (ref 11.2–14.5)
WBC Count: 4.4 10*3/uL (ref 3.9–10.3)

## 2018-06-05 NOTE — Telephone Encounter (Signed)
Spoke with patient re lab/fu 8/28. Central radiology will call re scan/thoracentesis.

## 2018-06-05 NOTE — Progress Notes (Signed)
[place] [phone]   [phone]  CONSULT NOTE  REFERRING PHYSICIAN: Dr. Burney Gauze  REASON FOR CONSULTATION:  77 years old white female recently diagnosed with lung cancer.  HPI Casey Wilkinson is a 77 y.o. female with past medical history significant for hypertension, diverticulosis, squamous cell carcinoma of the leg, history of early stage colon cancer 3 years ago status post surgical resection, COPD, discoid lupus as well as arthritis.  The patient mentioned that in May 2019 she was complaining of shortness of breath and cough.  She was given a diagnosis of pneumonia and she had 3 courses of antibiotics with no improvement in her condition.  CT scan of the chest on 03/17/2018 showed right lower lobe consolidation consistent with pneumonia.  There was a rounded region of lower attenuation containing abnormal air most consistent with developing lung abscess.  There was 2 new nodules in the left lung base.  The patient was seen by Dr. Lamonte Sakai and CT scan on 04/22/2018 showed no substantial interval change in the dense right lower lobe consolidative disease.  There was a stable pulmonary nodules in the left lower lobe.  On 05/06/2018 the patient underwent bronchoscopy under the care of Dr. Lamonte Sakai.  The final pathology ([id number]) of the right lower lobe transbronchial biopsy was consistent with adenocarcinoma, well-differentiated with lipidic and focal acinar patterns.  The tissue block was sent to [facility] for molecular studies.  It was positive for RET rearrangement.  It was negative for EGFR, ALK, ROS 1, BRAF, MET, KRAS and ERBB2.  PDL 1 expression was 5%.  The patient was seen during her hospitalization by Dr. Marin Olp but she lives in [place] and she prefers to receive her treatment locally close to home.  She had CT scan of the head that was negative for metastatic disease.  CT scan of the abdomen and pelvis showed no evidence of metastatic disease  in the abdomen or pelvis. When seen today she continues to complain of shortness of breath and she is currently on home oxygen.  She also has mild cough but no significant chest pain.  She had hemoptysis for the last 3 months.  She was seen by Dr. Sondra Come and she completed 10 fractions of radiotherapy to the chest on 06/03/2018.  The patient has no nausea, vomiting, diarrhea or constipation.  She denied having any weight loss or night sweats.  She has no headache or visual changes. Family history significant for father with heart disease and mother had Alzheimer's. The patient is married and has his son and daughter.  She was accompanied today by her husband Casey Wilkinson.  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  The patient has a history for smoking less than 1 pack/day for around 4 years and quit 22 years ago.  She has no history of alcohol or drug abuse.  HPI  Past Medical History:  Diagnosis Date  . Anemia   . Arthritis    "all over"  . Carotid artery stenosis    right side followed by [facility]- 65%  . Colitis April 2016  . Colon cancer ([place])   . COPD (chronic obstructive pulmonary disease) ([place])   . Discoid lupus erythematosus   . Diverticulitis April 2016   and Colitis  . History of blood transfusion 01/2015, 06/2015   colectomy  . Hypertension   . Psoriatic arthritis ([place])   . Shortness of breath dyspnea    with exertion  . Squamous  cell carcinoma, leg     Past Surgical History:  Procedure Laterality Date  . APPENDECTOMY  06/2015  . CATARACT EXTRACTION W/ INTRAOCULAR LENS  IMPLANT, BILATERAL Bilateral 2017  . COLONOSCOPY  06/23/15  . COLONOSCOPY W/ POLYPECTOMY    . CYSTOCELE REPAIR  2006  . DILATION AND CURETTAGE OF UTERUS  X 2  . EXCISIONAL HEMORRHOIDECTOMY  2006  . JOINT REPLACEMENT    . LAPAROSCOPIC PARTIAL COLECTOMY N/A 06/23/2015   Procedure: LAPAROSCOPIC ILEOCECETOMY;  Surgeon: Excell Seltzer, MD;  Location: [facility];  Service: General;  Laterality: N/A;  . MOHS SURGERY Left      "basal; LLE"  . RECTOCELE REPAIR  2006  . SQUAMOUS CELL CARCINOMA EXCISION  "several"   "legs mostly"  . TOTAL KNEE ARTHROPLASTY Left 01/31/2016   Procedure: LEFT TOTAL KNEE ARTHROPLASTY;  Surgeon: Leandrew Koyanagi, MD;  Location: [facility];  Service: Orthopedics;  Laterality: Left;  Marland Kitchen VAGINAL HYSTERECTOMY  1968   "total"  . VIDEO BRONCHOSCOPY Bilateral 05/06/2018   Procedure: VIDEO BRONCHOSCOPY WITH FLUORO;  Surgeon: Collene Gobble, MD;  Location: [facility];  Service: Cardiopulmonary;  Laterality: Bilateral;    Family History  Problem Relation Age of Onset  . Heart disease Father        Before age 70  . Hyperlipidemia Father   . Hypertension Father   . Pneumonia Mother   . Alzheimer's disease Mother   . Colon cancer Neg Hx     Social History Social History   Tobacco Use  . Smoking status: Former Smoker    Packs/day: 0.50    Years: 50.00    Pack years: 25.00    Types: Cigarettes    Last attempt to quit: 12/05/2000    Years since quitting: 17.5  . Smokeless tobacco: Never Used  . Tobacco comment: started smoking in early 20s  Substance Use Topics  . Alcohol use: No    Alcohol/week: 0.0 standard drinks  . Drug use: No    Allergies  Allergen Reactions  . Prochlorperazine Edisylate Nausea Only and Other (See Comments)    **COMPAZINE**   Stroke-like symptoms  . Sulfamethoxazole Nausea Only and Other (See Comments)    Pt taking Methotrexate, Sulfur drugs could cause SEVERE fatal reaction.  . Doxycycline Hives    Current Outpatient Medications  Medication Sig Dispense Refill  . acetaminophen (TYLENOL) 650 MG CR tablet Take 1,300 mg by mouth every 8 (eight) hours as needed for pain.     Marland Kitchen albuterol (PROAIR HFA) 108 (90 Base) MCG/ACT inhaler Inhale 2 puffs into the lungs every 4 (four) hours as needed for wheezing or shortness of breath. 3 Inhaler 1  . aspirin EC 81 MG tablet Take by mouth.    Marland Kitchen atorvastatin (LIPITOR) 10 MG tablet Take 10 mg by mouth every morning.     .  Bromfenac Sodium 0.07 % SOLN Instill 1 drop in operative eye QD starting 2 days prior to surgery and continue 2 weeks after surgery. (Patient has a coupon)    . Calcium Carbonate-Vitamin D 600-400 MG-UNIT per tablet Take 1 tablet by mouth every morning.     . docusate sodium (COLACE) 100 MG capsule Take 100 mg by mouth at bedtime.     . folic acid (FOLVITE) 1 MG tablet Take 1 mg by mouth every morning.     Marland Kitchen HYDROcodone-homatropine (HYCODAN) 5-1.5 MG/5ML syrup Take 5 mLs by mouth at bedtime and may repeat dose one time if needed. 120 mL 0  .  hydroxychloroquine (PLAQUENIL) 200 MG tablet Take by mouth.    . lidocaine (XYLOCAINE) 2 % solution caregiver: Mix 1part 2% viscous lidocaine, 1part H20. Swallow 101m of diluted mixture, 342m before meals and at bedtime, up to QID 150 mL 2  . Loratadine 5 MG/5ML SOLN Take 10 mLs by mouth at bedtime.    . methotrexate (RHEUMATREX) 2.5 MG tablet     . Multiple Vitamins-Minerals (ABC PLUS SENIOR) TABS Take 1 tablet by mouth every morning.     . Marland Kitchenfloxacin (OCUFLOX) 0.3 % ophthalmic solution Instill 1 drop BID in operative eye starting 2 days prior to surgery and continue 2 weeks after surgery.    . Marland KitchenOLY-IRON 150 150 MG capsule Take 15015my mouth once daily  5  . polyethylene glycol (MIRALAX / GLYCOLAX) packet Take 17 g by mouth daily as needed for mild constipation.     . Potassium 99 MG TABS Take 99 mg by mouth daily.    . predniSONE (DELTASONE) 10 MG tablet Take 4 tabs for 3 days, then 3 tabs for 3 days, then 2 tabs for 3 days, then 1 tab for 3 days, then 1/2 tab for 4 days. 32 tablet 0  . PROCTOZONE-HC 2.5 % rectal cream APP TO PERIANAL AREA UP TO TID PRF HEMORRHOIDS PROBLEMS  6  . Tiotropium Bromide-Olodaterol (STIOLTO RESPIMAT) 2.5-2.5 MCG/ACT AERS USE 2 INHALATIONS DAILY    . TRELEGY ELLIPTA 100-62.5-25 MCG/INH AEPB USE 1 INHALATION DAILY 180 each 1  . valsartan-hydrochlorothiazide (DIOVAN-HCT) 160-12.5 MG per tablet Take 1 tablet by mouth every morning.      . vitamin B-12 (CYANOCOBALAMIN) 500 MCG tablet Take 2,500 mcg by mouth every morning.      No current facility-administered medications for this visit.     Review of Systems  Constitutional: positive for fatigue Eyes: negative Ears, nose, mouth, throat, and face: negative Respiratory: positive for cough, dyspnea on exertion and hemoptysis Cardiovascular: negative Gastrointestinal: negative Genitourinary:negative Integument/breast: negative Hematologic/lymphatic: negative Musculoskeletal:positive for muscle weakness Neurological: negative Behavioral/Psych: negative Endocrine: negative Allergic/Immunologic: negative  Physical Exam  RALRCB:ULAGTealthy, no distress, well nourished and well developed SKIN: skin color, texture, turgor are normal, no rashes or significant lesions HEAD: Normocephalic, No masses, lesions, tenderness or abnormalities EYES: normal, PERRLA, Conjunctiva are pink and non-injected EARS: External ears normal, Canals clear OROPHARYNX:no exudate, no erythema and lips, buccal mucosa, and tongue normal  NECK: supple, no adenopathy, no JVD LYMPH:  no palpable lymphadenopathy, no hepatosplenomegaly BREAST:not examined LUNGS: clear to auscultation , and palpation HEART: regular rate & rhythm, no murmurs and no gallops ABDOMEN:abdomen soft, non-tender, normal bowel sounds and no masses or organomegaly BACK: Back symmetric, no curvature., No CVA tenderness EXTREMITIES:no joint deformities, effusion, or inflammation, no edema  NEURO: alert & oriented x 3 with fluent speech, no focal motor/sensory deficits  PERFORMANCE STATUS: ECOG 1  LABORATORY DATA: Lab Results  Component Value Date   WBC 4.4 06/05/2018   HGB 8.5 (L) 06/05/2018   HCT 26.6 (L) 06/05/2018   MCV 92.0 06/05/2018   PLT 189 06/05/2018      Chemistry      Component Value Date/Time   NA 133 (L) 06/05/2018 0904   K 4.0 06/05/2018 0904   CL 96 (L) 06/05/2018 0904   CO2 30 06/05/2018 0904    BUN 16 06/05/2018 0904   CREATININE 0.62 06/05/2018 0904      Component Value Date/Time   CALCIUM 8.8 (L) 06/05/2018 0904   ALKPHOS 112 06/05/2018 0904   AST  18 06/05/2018 0904   ALT 13 06/05/2018 0904   BILITOT 0.6 06/05/2018 0904       RADIOGRAPHIC STUDIES: Dg Chest 2 View  Result Date: 05/12/2018 CLINICAL DATA:  Shortness of breath. Diagnosed with lung cancer 4 days ago. History of colon cancer. EXAM: CHEST - 2 VIEW COMPARISON:  Chest radiograph May 06, 2018 FINDINGS: Persistent RIGHT lung base consolidation. Increasing lobulated moderate RIGHT, small LEFT pleural effusions. Cardiac silhouette is upper limits of normal size. Calcified aortic arch. Increased lung volumes with mild apical bullous changes suspected. No pneumothorax. Soft tissue planes and included osseous structures are non suspicious. IMPRESSION: Increasing moderate RIGHT and small LEFT pleural effusions. Persistent consolidation RIGHT lower lobe. Aortic Atherosclerosis (ICD10-I70.0). Emphysema (ICD10-J43.9). Electronically Signed   By: Elon Alas M.D.   On: 05/12/2018 17:52   Ct Head W & Wo Contrast  Result Date: 05/18/2018 CLINICAL DATA:  Non-small-cell lung cancer staging. History of colon cancer. EXAM: CT HEAD WITHOUT AND WITH CONTRAST TECHNIQUE: Contiguous axial images were obtained from the base of the skull through the vertex without and with intravenous contrast CONTRAST:  40m OMNIPAQUE IOHEXOL 300 MG/ML  SOLN COMPARISON:  None. FINDINGS: Brain: Moderate atrophy. Extensive white matter hypodensity diffusely and symmetrically. Negative for acute infarct. Negative for hemorrhage or mass. No enhancing metastatic deposits postcontrast administration Vascular: Negative for hyperdense vessel. Atherosclerotic calcification. Skull: Negative Sinuses/Orbits: Paranasal sinuses clear.  Bilateral cataract surgery Other: None IMPRESSION: Negative for metastatic disease Atrophy and extensive chronic microvascular ischemic  change in the white matter. No acute abnormality. Electronically Signed   By: CFranchot GalloM.D.   On: 05/18/2018 15:08   Ct Angio Chest Pe W/cm &/or Wo Cm  Result Date: 05/12/2018 CLINICAL DATA:  Short of breath.  History of lung cancer EXAM: CT ANGIOGRAPHY CHEST WITH CONTRAST TECHNIQUE: Multidetector CT imaging of the chest was performed using the standard protocol during bolus administration of intravenous contrast. Multiplanar CT image reconstructions and MIPs were obtained to evaluate the vascular anatomy. CONTRAST:  1082mISOVUE-370 IOPAMIDOL (ISOVUE-370) INJECTION 76% COMPARISON:  Chest CT 04/22/2018 FINDINGS: Cardiovascular: Negative for pulmonary embolism. Pulmonary arteries normal in caliber. Atherosclerotic calcification aortic arch without aneurysm or dissection. Heart size within normal limits. Mediastinum/Nodes: Negative for mass or adenopathy Lungs/Pleura: Persistent right lower lobe collapse with small right effusion. No obstructing mass lesion. Recent biopsy positive for right lower low adenocarcinoma. Severe emphysema. Left lower lobe scarring. No effusion on the left. Upper Abdomen: Negative Musculoskeletal: Multilevel degenerative change. No acute skeletal abnormality. Review of the MIP images confirms the above findings. IMPRESSION: Negative for pulmonary embolism Persistent right lower lobe collapse and small right effusion. Aortic Atherosclerosis (ICD10-I70.0) and Emphysema (ICD10-J43.9). Electronically Signed   By: ChFranchot Gallo.D.   On: 05/12/2018 20:09   Ct Abdomen Pelvis W Contrast  Result Date: 05/18/2018 CLINICAL DATA:  Restaging colon cancer. EXAM: CT ABDOMEN AND PELVIS WITH CONTRAST TECHNIQUE: Multidetector CT imaging of the abdomen and pelvis was performed using the standard protocol following bolus administration of intravenous contrast. CONTRAST:  7565mMNIPAQUE IOHEXOL 300 MG/ML  SOLN COMPARISON:  02/06/2015 CT scan. FINDINGS: Lower chest: Persistent dense airspace  consolidation in the right lower lobe with air bronchograms. This is unchanged when compared to prior chest CTs from 2019. There is also a right-sided pleural effusion which appears stable when compared to the most recent chest CT from 07/23. Stable emphysematous changes. No worrisome pulmonary lesions. The heart is normal in size. Stable aortic and coronary artery calcifications. Hepatobiliary: No  focal hepatic lesions to suggest metastatic disease. The liver contour is slightly irregular and the hepatic fissures are prominent. Findings could suggest cirrhosis. The gallbladder is contracted. No common bile duct dilatation. Pancreas: No mass, inflammation or ductal dilatation. Spleen: Normal size.  No focal lesions. Adrenals/Urinary Tract: Stable small adrenal gland nodule on the left. This is likely a benign adenoma. No renal lesions or hydronephrosis.  The bladder is normal. Stomach/Bowel: The stomach, duodenum, small bowel and colon are unremarkable. No acute inflammatory process, mass lesions or obstructive findings. Stable surgical changes from a partial right colectomy. No findings worrisome for recurrent tumor. Moderate stool throughout the colon and down into the rectum may suggest constipation. Vascular/Lymphatic: Advanced atherosclerotic calcifications involving the aorta and iliac arteries and branch vessels. No aneurysm or dissection. No mesenteric or retroperitoneal mass or adenopathy. Reproductive: Surgically absent. Other: No pelvic mass or adenopathy. No free pelvic fluid collections. No inguinal mass or adenopathy. No abdominal wall hernia or subcutaneous lesions. Small left inguinal hernia containing fat. Small periumbilical abdominal wall hernia on the right side containing only fat. Musculoskeletal: No significant bony findings. Advanced degenerate disc disease noted at L2-3 and L5-S1. IMPRESSION: 1. Stable surgical changes from a partial right hemicolectomy. The ileocolonic anastomosis is  unremarkable. No findings suspicious for recurrent tumor, locoregional adenopathy or metastatic disease elsewhere. 2. Stable dense airspace consolidation in the right lower lobe along with a right pleural effusion. 3. Advanced atherosclerotic calcifications involving the aorta, iliac arteries and branch vessels. 4. Small periumbilical abdominal wall hernia and left inguinal hernia. 5. Stool throughout the colon and down into the rectum could suggest constipation. 6. Stable small left adrenal gland nodule, likely benign adenoma. Electronically Signed   By: Marijo Sanes M.D.   On: 05/18/2018 15:52   Dg Chest Port 1 View  Result Date: 05/15/2018 CLINICAL DATA:  Shortness of breath EXAM: PORTABLE CHEST 1 VIEW COMPARISON:  05/12/2018 FINDINGS: Cardiac shadow is stable. Right pleural effusion is again noted with right basilar infiltrate. Small left pleural effusion is seen as well. No bony abnormality is noted. IMPRESSION: The overall appearance is similar to that seen on recent CT examination with persistent predominant right basilar changes identified. Electronically Signed   By: Inez Catalina M.D.   On: 05/15/2018 12:18   Dg Chest Portable 1 View  Result Date: 05/06/2018 CLINICAL DATA:  Chest pain EXAM: PORTABLE CHEST 1 VIEW COMPARISON:  05/06/2018 at 2:42 p.m. FINDINGS: Right lower lobe consolidation, stable. Atherosclerotic calcification of the aortic arch. Heart size within normal limits. Tapering of the peripheral pulmonary vasculature favors emphysema. IMPRESSION: 1. Stable consolidation in the right lower lobe. 2. Aortic Atherosclerosis (ICD10-I70.0) and Emphysema (ICD10-J43.9). Electronically Signed   By: Van Clines M.D.   On: 05/06/2018 22:11   Dg Chest Port 1 View  Result Date: 05/06/2018 CLINICAL DATA:  Followup bronchoscopy and biopsy. EXAM: PORTABLE CHEST 1 VIEW COMPARISON:  04/30/2018 FINDINGS: No pneumothorax or hemothorax. Emphysema and pulmonary scarring. Left lung remains clear.  Worsened opacity in the right lower lung which could be due to lavage or hemorrhage. IMPRESSION: Worsened opacity in the right lower lung which could be due to lavage or postprocedure hemorrhage. Background emphysema and scarring. Electronically Signed   By: Nelson Chimes M.D.   On: 05/06/2018 15:17   Dg C-arm Bronchoscopy  Result Date: 05/06/2018 C-ARM BRONCHOSCOPY: Fluoroscopy was utilized by the requesting physician.  No radiographic interpretation.    ASSESSMENT: This is a very pleasant 77 years old white female with unresectable  stage IIIA/IV non-small cell lung cancer, adenocarcinoma diagnosed in July 2019 presented with right lower lobe lung mass as well as right pleural effusion and a small left lower lobe lung nodules. Molecular studies showed positive RET rearrangement and PDL 1 expression of 5%. The patient underwent a short course of palliative radiotherapy to the right lower lobe lung mass because of her persistent hemoptysis completed on 06/03/2018.  She continues to have hemoptysis.  PLAN: I had a lengthy discussion with the patient and her husband today about her current disease stage, prognosis and treatment options. I personally and independently reviewed her scan images and discussed the results with the patient today. I recommended for the patient to complete the staging work-up by ordering a PET scan to rule out any other metastatic lesions. I also ordered ultrasound-guided right thoracentesis for diagnostic and therapeutic purposes.  We will send the fluid for cytologic evaluation. I will arrange for the patient to come back for follow-up visit in less than 2 weeks for reevaluation and discussion of her scan results and treatment options based on the final staging work-up. She may be considered for systemic chemotherapy initially but targeted therapy for RET is a consideration but currently available on clinical trial basis. The patient was advised to call immediately if she has  any concerning symptoms in the interval. The patient voices understanding of current disease status and treatment options and is in agreement with the current care plan.  All questions were answered. The patient knows to call the clinic with any problems, questions or concerns. We can certainly see the patient much sooner if necessary.  Thank you so much for allowing me to participate in the care of Ralph Leyden. I will continue to follow up the patient with you and assist in her care.  I spent 40 minutes counseling the patient face to face. The total time spent in the appointment was 60 minutes.  Disclaimer: This note was dictated with voice recognition software. Similar sounding words can inadvertently be transcribed and may not be corrected upon review.   Eilleen Kempf June 05, 2018, 9:59 AM

## 2018-06-07 DIAGNOSIS — Z7189 Other specified counseling: Secondary | ICD-10-CM | POA: Insufficient documentation

## 2018-06-08 ENCOUNTER — Encounter: Payer: Self-pay | Admitting: Radiation Oncology

## 2018-06-08 DIAGNOSIS — J449 Chronic obstructive pulmonary disease, unspecified: Secondary | ICD-10-CM | POA: Diagnosis not present

## 2018-06-08 DIAGNOSIS — C3431 Malignant neoplasm of lower lobe, right bronchus or lung: Secondary | ICD-10-CM | POA: Diagnosis not present

## 2018-06-08 DIAGNOSIS — R600 Localized edema: Secondary | ICD-10-CM | POA: Diagnosis not present

## 2018-06-08 DIAGNOSIS — D638 Anemia in other chronic diseases classified elsewhere: Secondary | ICD-10-CM | POA: Diagnosis not present

## 2018-06-08 NOTE — Progress Notes (Signed)
  Radiation Oncology         [phone]) [phone] ________________________________  Name: Casey Wilkinson MRN: [id number]  Date: 06/08/2018  DOB: Jan 13, 1941  End of Treatment Note  Diagnosis:   Adenocarcinoma of the Right Lower Lung     Indication for treatment:  Palliative       Radiation treatment dates:   05/21/18 - 06/03/18  Site/dose:   Lung, Right/ 30 Gy in 10 fractions  Beams/energy:   3D, Photon/ 15X, 6X, 10X  Narrative: The patient tolerated radiation treatment relatively well. She notes a history of hemoptysis since May that occurred mostly at night. She reported moderate fatigue and denied any new pain and difficulty or painful swallowing throughout. By the end of treatment, she reported a sore throat that felt raw and skin redness with a rash. No further hemoptysis.  Plan: The patient has completed radiation treatment. The patient will return to radiation oncology clinic for routine followup in one month. I advised them to call or return sooner if they have any questions or concerns related to their recovery or treatment.  -----------------------------------  Blair Promise, PhD, MD  This document serves as a record of services personally performed by Gery Pray, MD. It was created on his behalf by Wilburn Mylar, a trained medical scribe. The creation of this record is based on the scribe's personal observations and the provider's statements to them. This document has been checked and approved by the attending provider.

## 2018-06-09 DIAGNOSIS — J449 Chronic obstructive pulmonary disease, unspecified: Secondary | ICD-10-CM | POA: Diagnosis not present

## 2018-06-09 DIAGNOSIS — C3431 Malignant neoplasm of lower lobe, right bronchus or lung: Secondary | ICD-10-CM | POA: Diagnosis not present

## 2018-06-09 DIAGNOSIS — R600 Localized edema: Secondary | ICD-10-CM | POA: Diagnosis not present

## 2018-06-09 DIAGNOSIS — R0902 Hypoxemia: Secondary | ICD-10-CM | POA: Diagnosis not present

## 2018-06-10 ENCOUNTER — Ambulatory Visit (HOSPITAL_COMMUNITY)
Admission: RE | Admit: 2018-06-10 | Discharge: 2018-06-10 | Disposition: A | Discharge status: Home / Self Care | Payer: Medicare Other | Source: Ambulatory Visit | Attending: Radiology | Admitting: Radiology

## 2018-06-10 ENCOUNTER — Ambulatory Visit (HOSPITAL_COMMUNITY)
Admission: RE | Admit: 2018-06-10 | Discharge: 2018-06-10 | Disposition: A | Discharge status: Home / Self Care | Payer: Medicare Other | Source: Ambulatory Visit | Attending: Internal Medicine | Admitting: Internal Medicine

## 2018-06-10 DIAGNOSIS — J90 Pleural effusion, not elsewhere classified: Secondary | ICD-10-CM | POA: Insufficient documentation

## 2018-06-10 DIAGNOSIS — J439 Emphysema, unspecified: Secondary | ICD-10-CM | POA: Diagnosis not present

## 2018-06-10 DIAGNOSIS — I700 Atherosclerosis of aorta: Secondary | ICD-10-CM | POA: Insufficient documentation

## 2018-06-10 DIAGNOSIS — R918 Other nonspecific abnormal finding of lung field: Secondary | ICD-10-CM | POA: Diagnosis not present

## 2018-06-10 DIAGNOSIS — C3490 Malignant neoplasm of unspecified part of unspecified bronchus or lung: Secondary | ICD-10-CM | POA: Diagnosis not present

## 2018-06-10 DIAGNOSIS — Z9889 Other specified postprocedural states: Secondary | ICD-10-CM

## 2018-06-10 DIAGNOSIS — R091 Pleurisy: Secondary | ICD-10-CM | POA: Diagnosis not present

## 2018-06-10 NOTE — Procedures (Addendum)
Ultrasound-guided diagnostic and therapeutic right thoracentesis performed yielding 50 cc of yellow fluid. No immediate complications. Follow-up chest x-ray pending. The fluid was sent to the lab for cytology. The right effusion was very small; no sig left effusion noted.

## 2018-06-11 ENCOUNTER — Ambulatory Visit: Payer: Medicare Other | Admitting: *Deleted

## 2018-06-11 DIAGNOSIS — C3431 Malignant neoplasm of lower lobe, right bronchus or lung: Secondary | ICD-10-CM | POA: Diagnosis not present

## 2018-06-11 DIAGNOSIS — I10 Essential (primary) hypertension: Secondary | ICD-10-CM | POA: Diagnosis not present

## 2018-06-11 DIAGNOSIS — R600 Localized edema: Secondary | ICD-10-CM | POA: Diagnosis not present

## 2018-06-11 DIAGNOSIS — J17 Pneumonia in diseases classified elsewhere: Secondary | ICD-10-CM | POA: Diagnosis not present

## 2018-06-12 ENCOUNTER — Ambulatory Visit: Payer: Self-pay | Admitting: *Deleted

## 2018-06-12 DIAGNOSIS — C3431 Malignant neoplasm of lower lobe, right bronchus or lung: Secondary | ICD-10-CM | POA: Diagnosis not present

## 2018-06-12 DIAGNOSIS — I9589 Other hypotension: Secondary | ICD-10-CM | POA: Diagnosis not present

## 2018-06-12 DIAGNOSIS — L930 Discoid lupus erythematosus: Secondary | ICD-10-CM | POA: Diagnosis not present

## 2018-06-12 DIAGNOSIS — J17 Pneumonia in diseases classified elsewhere: Secondary | ICD-10-CM | POA: Diagnosis not present

## 2018-06-14 ENCOUNTER — Encounter (HOSPITAL_COMMUNITY): Payer: Self-pay

## 2018-06-14 ENCOUNTER — Inpatient Hospital Stay (HOSPITAL_COMMUNITY)
Admission: EM | Admit: 2018-06-14 | Discharge: 2018-06-19 | DRG: 193 | Disposition: A | Discharge status: Skilled Nursing Facility | Payer: Medicare Other | Attending: Internal Medicine | Admitting: Internal Medicine

## 2018-06-14 ENCOUNTER — Emergency Department (HOSPITAL_COMMUNITY): Payer: Medicare Other

## 2018-06-14 ENCOUNTER — Other Ambulatory Visit: Payer: Self-pay

## 2018-06-14 DIAGNOSIS — C3431 Malignant neoplasm of lower lobe, right bronchus or lung: Secondary | ICD-10-CM | POA: Diagnosis present

## 2018-06-14 DIAGNOSIS — Z6825 Body mass index (BMI) 25.0-25.9, adult: Secondary | ICD-10-CM

## 2018-06-14 DIAGNOSIS — R509 Fever, unspecified: Secondary | ICD-10-CM

## 2018-06-14 DIAGNOSIS — J17 Pneumonia in diseases classified elsewhere: Secondary | ICD-10-CM | POA: Diagnosis not present

## 2018-06-14 DIAGNOSIS — M6281 Muscle weakness (generalized): Secondary | ICD-10-CM | POA: Diagnosis not present

## 2018-06-14 DIAGNOSIS — D630 Anemia in neoplastic disease: Secondary | ICD-10-CM | POA: Diagnosis present

## 2018-06-14 DIAGNOSIS — C3491 Malignant neoplasm of unspecified part of right bronchus or lung: Secondary | ICD-10-CM

## 2018-06-14 DIAGNOSIS — I472 Ventricular tachycardia: Secondary | ICD-10-CM | POA: Diagnosis present

## 2018-06-14 DIAGNOSIS — E785 Hyperlipidemia, unspecified: Secondary | ICD-10-CM | POA: Diagnosis not present

## 2018-06-14 DIAGNOSIS — D649 Anemia, unspecified: Secondary | ICD-10-CM | POA: Diagnosis not present

## 2018-06-14 DIAGNOSIS — R0902 Hypoxemia: Secondary | ICD-10-CM

## 2018-06-14 DIAGNOSIS — Z888 Allergy status to other drugs, medicaments and biological substances status: Secondary | ICD-10-CM | POA: Diagnosis not present

## 2018-06-14 DIAGNOSIS — J181 Lobar pneumonia, unspecified organism: Secondary | ICD-10-CM | POA: Diagnosis not present

## 2018-06-14 DIAGNOSIS — R0603 Acute respiratory distress: Secondary | ICD-10-CM | POA: Diagnosis not present

## 2018-06-14 DIAGNOSIS — J189 Pneumonia, unspecified organism: Secondary | ICD-10-CM | POA: Diagnosis not present

## 2018-06-14 DIAGNOSIS — Z85038 Personal history of other malignant neoplasm of large intestine: Secondary | ICD-10-CM

## 2018-06-14 DIAGNOSIS — Z882 Allergy status to sulfonamides status: Secondary | ICD-10-CM | POA: Diagnosis not present

## 2018-06-14 DIAGNOSIS — Z9981 Dependence on supplemental oxygen: Secondary | ICD-10-CM

## 2018-06-14 DIAGNOSIS — R2689 Other abnormalities of gait and mobility: Secondary | ICD-10-CM | POA: Diagnosis not present

## 2018-06-14 DIAGNOSIS — R634 Abnormal weight loss: Secondary | ICD-10-CM | POA: Diagnosis present

## 2018-06-14 DIAGNOSIS — L930 Discoid lupus erythematosus: Secondary | ICD-10-CM | POA: Diagnosis present

## 2018-06-14 DIAGNOSIS — E876 Hypokalemia: Secondary | ICD-10-CM | POA: Diagnosis not present

## 2018-06-14 DIAGNOSIS — J439 Emphysema, unspecified: Secondary | ICD-10-CM | POA: Diagnosis not present

## 2018-06-14 DIAGNOSIS — I959 Hypotension, unspecified: Secondary | ICD-10-CM | POA: Diagnosis not present

## 2018-06-14 DIAGNOSIS — R042 Hemoptysis: Secondary | ICD-10-CM | POA: Diagnosis present

## 2018-06-14 DIAGNOSIS — Z515 Encounter for palliative care: Secondary | ICD-10-CM | POA: Diagnosis present

## 2018-06-14 DIAGNOSIS — Y95 Nosocomial condition: Secondary | ICD-10-CM | POA: Diagnosis present

## 2018-06-14 DIAGNOSIS — Z66 Do not resuscitate: Secondary | ICD-10-CM | POA: Diagnosis present

## 2018-06-14 DIAGNOSIS — M2550 Pain in unspecified joint: Secondary | ICD-10-CM | POA: Diagnosis not present

## 2018-06-14 DIAGNOSIS — J440 Chronic obstructive pulmonary disease with acute lower respiratory infection: Secondary | ICD-10-CM | POA: Diagnosis present

## 2018-06-14 DIAGNOSIS — R000 Tachycardia, unspecified: Secondary | ICD-10-CM | POA: Diagnosis not present

## 2018-06-14 DIAGNOSIS — J90 Pleural effusion, not elsewhere classified: Secondary | ICD-10-CM | POA: Diagnosis not present

## 2018-06-14 DIAGNOSIS — J449 Chronic obstructive pulmonary disease, unspecified: Secondary | ICD-10-CM

## 2018-06-14 DIAGNOSIS — J9621 Acute and chronic respiratory failure with hypoxia: Secondary | ICD-10-CM | POA: Diagnosis present

## 2018-06-14 DIAGNOSIS — L4050 Arthropathic psoriasis, unspecified: Secondary | ICD-10-CM | POA: Diagnosis present

## 2018-06-14 DIAGNOSIS — Z96652 Presence of left artificial knee joint: Secondary | ICD-10-CM | POA: Diagnosis present

## 2018-06-14 DIAGNOSIS — Z79899 Other long term (current) drug therapy: Secondary | ICD-10-CM | POA: Diagnosis not present

## 2018-06-14 DIAGNOSIS — K30 Functional dyspepsia: Secondary | ICD-10-CM | POA: Diagnosis present

## 2018-06-14 DIAGNOSIS — R0600 Dyspnea, unspecified: Secondary | ICD-10-CM

## 2018-06-14 DIAGNOSIS — D638 Anemia in other chronic diseases classified elsewhere: Secondary | ICD-10-CM | POA: Diagnosis not present

## 2018-06-14 DIAGNOSIS — Z7189 Other specified counseling: Secondary | ICD-10-CM | POA: Diagnosis not present

## 2018-06-14 DIAGNOSIS — Z85828 Personal history of other malignant neoplasm of skin: Secondary | ICD-10-CM

## 2018-06-14 DIAGNOSIS — Z881 Allergy status to other antibiotic agents status: Secondary | ICD-10-CM

## 2018-06-14 DIAGNOSIS — I10 Essential (primary) hypertension: Secondary | ICD-10-CM | POA: Diagnosis present

## 2018-06-14 DIAGNOSIS — Z87891 Personal history of nicotine dependence: Secondary | ICD-10-CM | POA: Diagnosis not present

## 2018-06-14 DIAGNOSIS — Z7951 Long term (current) use of inhaled steroids: Secondary | ICD-10-CM | POA: Diagnosis not present

## 2018-06-14 DIAGNOSIS — R278 Other lack of coordination: Secondary | ICD-10-CM | POA: Diagnosis not present

## 2018-06-14 DIAGNOSIS — Z923 Personal history of irradiation: Secondary | ICD-10-CM | POA: Diagnosis not present

## 2018-06-14 DIAGNOSIS — J441 Chronic obstructive pulmonary disease with (acute) exacerbation: Secondary | ICD-10-CM | POA: Diagnosis present

## 2018-06-14 DIAGNOSIS — C3490 Malignant neoplasm of unspecified part of unspecified bronchus or lung: Secondary | ICD-10-CM | POA: Diagnosis not present

## 2018-06-14 DIAGNOSIS — R0602 Shortness of breath: Secondary | ICD-10-CM | POA: Diagnosis not present

## 2018-06-14 DIAGNOSIS — D509 Iron deficiency anemia, unspecified: Secondary | ICD-10-CM | POA: Diagnosis not present

## 2018-06-14 DIAGNOSIS — J9610 Chronic respiratory failure, unspecified whether with hypoxia or hypercapnia: Secondary | ICD-10-CM | POA: Diagnosis present

## 2018-06-14 DIAGNOSIS — E860 Dehydration: Secondary | ICD-10-CM | POA: Diagnosis present

## 2018-06-14 DIAGNOSIS — Z7401 Bed confinement status: Secondary | ICD-10-CM | POA: Diagnosis not present

## 2018-06-14 DIAGNOSIS — J188 Other pneumonia, unspecified organism: Secondary | ICD-10-CM | POA: Diagnosis not present

## 2018-06-14 LAB — TROPONIN I: TROPONIN I: 0.05 ng/mL — AB (ref <0.03)

## 2018-06-14 LAB — URINALYSIS, ROUTINE W REFLEX MICROSCOPIC
BILIRUBIN URINE: NEGATIVE
Glucose, UA: NEGATIVE mg/dL
HGB URINE DIPSTICK: NEGATIVE
Ketones, ur: 5 mg/dL — AB
Leukocytes, UA: NEGATIVE
Nitrite: NEGATIVE
PROTEIN: NEGATIVE mg/dL
SPECIFIC GRAVITY, URINE: 1.043 — AB (ref 1.005–1.030)
pH: 6 (ref 5.0–8.0)

## 2018-06-14 LAB — COMPREHENSIVE METABOLIC PANEL
ALBUMIN: 2.3 g/dL — AB (ref 3.5–5.0)
ALK PHOS: 77 U/L (ref 38–126)
ALT: 18 U/L (ref 0–44)
ANION GAP: 11 (ref 5–15)
AST: 24 U/L (ref 15–41)
BILIRUBIN TOTAL: 0.8 mg/dL (ref 0.3–1.2)
BUN: 24 mg/dL — ABNORMAL HIGH (ref 8–23)
CALCIUM: 8.7 mg/dL — AB (ref 8.9–10.3)
CO2: 27 mmol/L (ref 22–32)
Chloride: 98 mmol/L (ref 98–111)
Creatinine, Ser: 0.81 mg/dL (ref 0.44–1.00)
GFR calc Af Amer: >60 mL/min (ref >60)
GLUCOSE: 100 mg/dL — AB (ref 70–99)
Potassium: 4.2 mmol/L (ref 3.5–5.1)
Sodium: 136 mmol/L (ref 135–145)
TOTAL PROTEIN: 6.4 g/dL — AB (ref 6.5–8.1)

## 2018-06-14 LAB — HEMOGLOBIN AND HEMATOCRIT, BLOOD
HCT: 27.1 % — ABNORMAL LOW (ref 36.0–46.0)
Hemoglobin: 8.8 g/dL — ABNORMAL LOW (ref 12.0–15.0)

## 2018-06-14 LAB — I-STAT CG4 LACTIC ACID, ED: Lactic Acid, Venous: 0.98 mmol/L (ref 0.5–1.9)

## 2018-06-14 LAB — PREPARE RBC (CROSSMATCH)

## 2018-06-14 LAB — CBC WITH DIFFERENTIAL/PLATELET
BASOS PCT: 0 %
Basophils Absolute: 0 10*3/uL (ref 0.0–0.1)
Eosinophils Absolute: 0 10*3/uL (ref 0.0–0.7)
Eosinophils Relative: 0 %
HEMATOCRIT: 24.8 % — AB (ref 36.0–46.0)
HEMOGLOBIN: 7.9 g/dL — AB (ref 12.0–15.0)
LYMPHS PCT: 5 %
Lymphs Abs: 0.3 10*3/uL — ABNORMAL LOW (ref 0.7–4.0)
MCH: 28.8 pg (ref 26.0–34.0)
MCHC: 31.9 g/dL (ref 30.0–36.0)
MCV: 90.5 fL (ref 78.0–100.0)
MONOS PCT: 3 %
Monocytes Absolute: 0.2 10*3/uL (ref 0.1–1.0)
NEUTROS ABS: 5.8 10*3/uL (ref 1.7–7.7)
NEUTROS PCT: 92 %
Platelets: 323 10*3/uL (ref 150–400)
RBC: 2.74 MIL/uL — ABNORMAL LOW (ref 3.87–5.11)
RDW: 18 % — ABNORMAL HIGH (ref 11.5–15.5)
WBC: 6.3 10*3/uL (ref 4.0–10.5)

## 2018-06-14 LAB — BRAIN NATRIURETIC PEPTIDE: B Natriuretic Peptide: 117.4 pg/mL — ABNORMAL HIGH (ref 0.0–100.0)

## 2018-06-14 MED ORDER — HYDROCODONE-HOMATROPINE 5-1.5 MG/5ML PO SYRP
5.0000 mL | ORAL_SOLUTION | Freq: Two times a day (BID) | ORAL | Status: DC
  Administered 2018-06-15 – 2018-06-19 (×9): 5 mL via ORAL
  Filled 2018-06-14 (×9): qty 5

## 2018-06-14 MED ORDER — POLYSACCHARIDE IRON COMPLEX 150 MG PO CAPS
150.0000 mg | ORAL_CAPSULE | Freq: Every day | ORAL | Status: DC
  Administered 2018-06-15 – 2018-06-19 (×5): 150 mg via ORAL
  Filled 2018-06-14 (×5): qty 1

## 2018-06-14 MED ORDER — ALBUTEROL SULFATE (2.5 MG/3ML) 0.083% IN NEBU
5.0000 mg | INHALATION_SOLUTION | Freq: Once | RESPIRATORY_TRACT | Status: AC
  Administered 2018-06-14: 5 mg via RESPIRATORY_TRACT
  Filled 2018-06-14: qty 6

## 2018-06-14 MED ORDER — ADULT MULTIVITAMIN W/MINERALS CH
1.0000 | ORAL_TABLET | Freq: Every day | ORAL | Status: DC
  Administered 2018-06-15 – 2018-06-19 (×5): 1 via ORAL
  Filled 2018-06-14 (×5): qty 1

## 2018-06-14 MED ORDER — GUAIFENESIN ER 600 MG PO TB12: 600.0000 mg | ORAL_TABLET | Freq: Two times a day (BID) | ORAL | Status: DC | PRN

## 2018-06-14 MED ORDER — SODIUM CHLORIDE 0.9 % IV SOLN
INTRAVENOUS | Status: DC
  Administered 2018-06-14 (×2): via INTRAVENOUS

## 2018-06-14 MED ORDER — ACETAMINOPHEN 325 MG PO TABS
650.0000 mg | ORAL_TABLET | Freq: Four times a day (QID) | ORAL | Status: DC | PRN
  Administered 2018-06-18: 650 mg via ORAL
  Filled 2018-06-14: qty 2

## 2018-06-14 MED ORDER — ACETAMINOPHEN 650 MG RE SUPP: 650.0000 mg | Freq: Four times a day (QID) | RECTAL | Status: DC | PRN

## 2018-06-14 MED ORDER — SODIUM CHLORIDE 0.9 % IV SOLN
1000.0000 mL | INTRAVENOUS | Status: DC
  Administered 2018-06-14: 1000 mL via INTRAVENOUS

## 2018-06-14 MED ORDER — SODIUM CHLORIDE 0.9% IV SOLUTION: Freq: Once | INTRAVENOUS | Status: DC

## 2018-06-14 MED ORDER — SODIUM CHLORIDE 0.9% FLUSH: 3.0000 mL | INTRAVENOUS | Status: DC | PRN

## 2018-06-14 MED ORDER — SODIUM CHLORIDE 0.9 % IV SOLN: 250.0000 mL | INTRAVENOUS | Status: DC | PRN

## 2018-06-14 MED ORDER — SODIUM CHLORIDE 0.9 % IV SOLN
2.0000 g | Freq: Once | INTRAVENOUS | Status: AC
  Administered 2018-06-14: 2 g via INTRAVENOUS
  Filled 2018-06-14: qty 2

## 2018-06-14 MED ORDER — HYDROCODONE-HOMATROPINE 5-1.5 MG/5ML PO SYRP: 5.0000 mL | ORAL_SOLUTION | Freq: Every evening | ORAL | Status: DC | PRN

## 2018-06-14 MED ORDER — ENSURE ENLIVE PO LIQD
237.0000 mL | Freq: Three times a day (TID) | ORAL | Status: DC
  Administered 2018-06-15 – 2018-06-19 (×8): 237 mL via ORAL

## 2018-06-14 MED ORDER — PREDNISONE 5 MG PO TABS
10.0000 mg | ORAL_TABLET | Freq: Every day | ORAL | Status: AC
  Administered 2018-06-15 – 2018-06-17 (×3): 10 mg via ORAL
  Filled 2018-06-14: qty 1
  Filled 2018-06-14: qty 2

## 2018-06-14 MED ORDER — IOHEXOL 300 MG/ML  SOLN
75.0000 mL | Freq: Once | INTRAMUSCULAR | Status: AC | PRN
  Administered 2018-06-14: 75 mL via INTRAVENOUS

## 2018-06-14 MED ORDER — TRAMADOL HCL 50 MG PO TABS: 50.0000 mg | ORAL_TABLET | Freq: Four times a day (QID) | ORAL | Status: DC | PRN

## 2018-06-14 MED ORDER — VANCOMYCIN HCL IN DEXTROSE 1-5 GM/200ML-% IV SOLN
1000.0000 mg | Freq: Once | INTRAVENOUS | Status: DC
  Filled 2018-06-14: qty 200

## 2018-06-14 MED ORDER — ONDANSETRON HCL 4 MG/2ML IJ SOLN
4.0000 mg | Freq: Four times a day (QID) | INTRAMUSCULAR | Status: DC | PRN
  Administered 2018-06-17: 4 mg via INTRAVENOUS
  Filled 2018-06-14: qty 2

## 2018-06-14 MED ORDER — FAMOTIDINE 20 MG PO TABS
20.0000 mg | ORAL_TABLET | Freq: Two times a day (BID) | ORAL | Status: DC
  Administered 2018-06-15 – 2018-06-19 (×9): 20 mg via ORAL
  Filled 2018-06-14 (×9): qty 1

## 2018-06-14 MED ORDER — FLUTICASONE-UMECLIDIN-VILANT 100-62.5-25 MCG/INH IN AEPB: 1.0000 | INHALATION_SPRAY | Freq: Every day | RESPIRATORY_TRACT | Status: DC

## 2018-06-14 MED ORDER — IPRATROPIUM-ALBUTEROL 0.5-2.5 (3) MG/3ML IN SOLN: 3.0000 mL | Freq: Four times a day (QID) | RESPIRATORY_TRACT | Status: DC | PRN

## 2018-06-14 MED ORDER — SODIUM CHLORIDE 0.9% FLUSH
3.0000 mL | Freq: Two times a day (BID) | INTRAVENOUS | Status: DC
  Administered 2018-06-15 – 2018-06-18 (×4): 3 mL via INTRAVENOUS

## 2018-06-14 MED ORDER — FOLIC ACID 1 MG PO TABS
1.0000 mg | ORAL_TABLET | Freq: Every morning | ORAL | Status: DC
  Administered 2018-06-15 – 2018-06-19 (×5): 1 mg via ORAL
  Filled 2018-06-14 (×5): qty 1

## 2018-06-14 MED ORDER — IOPAMIDOL (ISOVUE-300) INJECTION 61%: 75.0000 mL | Freq: Once | INTRAVENOUS | Status: DC | PRN

## 2018-06-14 MED ORDER — PREDNISONE 20 MG PO TABS: 10.0000 mg | ORAL_TABLET | Freq: Every day | ORAL | Status: DC

## 2018-06-14 MED ORDER — PREDNISONE 20 MG PO TABS
40.0000 mg | ORAL_TABLET | Freq: Once | ORAL | Status: AC
  Administered 2018-06-14: 40 mg via ORAL
  Filled 2018-06-14: qty 2

## 2018-06-14 MED ORDER — ONDANSETRON HCL 4 MG PO TABS: 4.0000 mg | ORAL_TABLET | Freq: Four times a day (QID) | ORAL | Status: DC | PRN

## 2018-06-14 MED ORDER — CALCIUM CARBONATE ANTACID 500 MG PO CHEW
1.0000 | CHEWABLE_TABLET | Freq: Two times a day (BID) | ORAL | Status: DC
  Administered 2018-06-15 – 2018-06-19 (×8): 200 mg via ORAL
  Filled 2018-06-14 (×9): qty 1

## 2018-06-14 MED ORDER — PANTOPRAZOLE SODIUM 40 MG PO TBEC
40.0000 mg | DELAYED_RELEASE_TABLET | Freq: Two times a day (BID) | ORAL | Status: DC
  Administered 2018-06-15 – 2018-06-19 (×9): 40 mg via ORAL
  Filled 2018-06-14 (×9): qty 1

## 2018-06-14 NOTE — H&P (Signed)
History and Physical    Casey Wilkinson  ASN:[id number]  DOB: [birth year]  DOA: 06/14/2018 PCP: Shon Baton, MD   Patient coming from: home  Chief Complaint: shortness of breath and fast heart rate  HPI: Casey Wilkinson is a 77 y.o. 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  She underwent a thoracentesis on 06/10/18 and 50 cc where removed. She returns for a pulse ox of 84% at the SNF. She was given Neb treatments. Her family states that the SNF has steadily been going up on her O2 from 2 liters to about 5-6 L lately and her shortness of breath is not acute but has slowly been worsening for weeks. She can barely ambulate short distances anymore. He has a mild cough with is mostly productive of blood and this is unchanged for weeks now. This AM her cough was productive of clear sputum. She has not had any chest pain. She has not had any fevers or chills. She had pedal edema when she was getting radiation but this issues has resolved. No h/o CHF.   ED Course: Prednisone and Albuterol neb given, Vanc and Cefepime ordered  Review of Systems:  Admits to weight loss of about 10 lb in past month. Has severe indigestion every time she eats and drinks and has had a poor oral intake per family. No h/o PUD.  Has a cough with hemoptysis as mentioned. All other systems reviewed and apart from HPI, are negative.  Past Medical History:  Diagnosis Date  . Anemia   . Arthritis    "all over"  . Carotid artery stenosis    right side followed by [facility]- 65%  . Colitis April 2016  . Colon cancer ([place])   . COPD (chronic obstructive pulmonary disease) ([place])   . Discoid lupus erythematosus   . Diverticulitis April 2016   and Colitis  . History of blood transfusion 01/2015, 06/2015   colectomy  . Hypertension   .  Psoriatic arthritis ([place])   . Shortness of breath dyspnea    with exertion  . Squamous cell carcinoma, leg     Past Surgical History:  Procedure Laterality Date  . APPENDECTOMY  06/2015  . CATARACT EXTRACTION W/ INTRAOCULAR LENS  IMPLANT, BILATERAL Bilateral 2017  . COLONOSCOPY  06/23/15  . COLONOSCOPY W/ POLYPECTOMY    . CYSTOCELE REPAIR  2006  . DILATION AND CURETTAGE OF UTERUS  X 2  . EXCISIONAL HEMORRHOIDECTOMY  2006  . JOINT REPLACEMENT    . LAPAROSCOPIC PARTIAL COLECTOMY N/A 06/23/2015   Procedure: LAPAROSCOPIC ILEOCECETOMY;  Surgeon: Excell Seltzer, MD;  Location: [facility];  Service: General;  Laterality: N/A;  . MOHS SURGERY Left    "basal; LLE"  . RECTOCELE REPAIR  2006  . SQUAMOUS CELL CARCINOMA EXCISION  "several"   "legs mostly"  . TOTAL KNEE ARTHROPLASTY Left 01/31/2016   Procedure: LEFT TOTAL KNEE ARTHROPLASTY;  Surgeon: Leandrew Koyanagi, MD;  Location: [facility];  Service: Orthopedics;  Laterality: Left;  Marland Kitchen VAGINAL HYSTERECTOMY  1968   "total"  . VIDEO BRONCHOSCOPY Bilateral 05/06/2018   Procedure: VIDEO BRONCHOSCOPY WITH FLUORO;  Surgeon: Collene Gobble, MD;  Location: [facility];  Service: Cardiopulmonary;  Laterality: Bilateral;    Social History:   reports that she quit smoking about 17 years ago. Her smoking  use included cigarettes. She has a 25.00 pack-year smoking history. She has never used smokeless tobacco. She reports that she does not drink alcohol or use drugs.  Lives at SNF.   Allergies  Allergen Reactions  . Prochlorperazine Edisylate Nausea Only and Other (See Comments)    **COMPAZINE**   Stroke-like symptoms  . Sulfamethoxazole Nausea Only and Other (See Comments)    Pt taking Methotrexate, Sulfur drugs could cause SEVERE fatal reaction.  . Doxycycline Hives    Family History  Problem Relation Age of Onset  . Heart disease Father        Before age 61  . Hyperlipidemia Father   . Hypertension Father   . Pneumonia Mother   . Alzheimer's disease  Mother   . Colon cancer Neg Hx      Prior to Admission medications   Medication Sig Start Date End Date Taking? Authorizing Provider  acetaminophen (TYLENOL) 325 MG tablet Take 1,300 mg by mouth every 8 (eight) hours as needed for mild pain.   Yes [provider]  albuterol (PROAIR HFA) 108 (90 Base) MCG/ACT inhaler Inhale 2 puffs into the lungs every 4 (four) hours as needed for wheezing or shortness of breath. 03/31/18  Yes Collene Gobble, MD  atorvastatin (LIPITOR) 10 MG tablet Take 10 mg by mouth daily.   Yes [provider]  Calcium Carbonate-Vitamin D 600-400 MG-UNIT per tablet Take 1 tablet by mouth every morning.    Yes [provider]  Cyanocobalamin (B-12) 2500 MCG TABS Take 2,500 mcg by mouth daily.   Yes [provider]  docusate sodium (COLACE) 100 MG capsule Take 100 mg by mouth at bedtime.    Yes [provider]  famotidine (PEPCID) 20 MG tablet Take 20 mg by mouth 2 (two) times daily.   Yes [provider]  folic acid (FOLVITE) 1 MG tablet Take 1 mg by mouth every morning.    Yes [provider]  guaiFENesin (MUCINEX) 600 MG 12 hr tablet Take 600 mg by mouth 2 (two) times daily as needed for cough.   Yes [provider]  HYDROcodone-homatropine (HYCODAN) 5-1.5 MG/5ML syrup Take 5 mLs by mouth at bedtime and may repeat dose one time if needed. 05/22/18  Yes Dessa Phi, DO  iron polysaccharides (NIFEREX) 150 MG capsule Take 150 mg by mouth daily.   Yes [provider]  levofloxacin (LEVAQUIN) 500 MG tablet Take 500 mg by mouth daily.   Yes [provider]  lidocaine (XYLOCAINE) 2 % solution caregiver: Mix 1part 2% viscous lidocaine, 1part H20. Swallow 88mL of diluted mixture, 45min before meals and at bedtime, up to QID 06/03/18  Yes Eppie Gibson, MD  loratadine (CLARITIN) 10 MG tablet Take 10 mg by mouth daily.   Yes [provider]  Multiple Vitamin (MULTIVITAMIN WITH MINERALS)  TABS tablet Take 1 tablet by mouth daily.   Yes [provider]  polyethylene glycol (MIRALAX / GLYCOLAX) packet Take 17 g by mouth daily as needed for mild constipation.    Yes [provider]  Potassium 99 MG TABS Take 99 mg by mouth daily.   Yes [provider]  traMADol (ULTRAM) 50 MG tablet Take 50 mg by mouth every 6 (six) hours as needed for moderate pain.   Yes [provider]  TRELEGY ELLIPTA 100-62.5-25 MCG/INH AEPB USE 1 INHALATION DAILY Patient taking differently: Inhale 1 puff into the lungs daily.  02/26/18  Yes Collene Gobble, MD  valsartan-hydrochlorothiazide (DIOVAN-HCT) 160-12.5  MG per tablet Take 1 tablet by mouth every morning.  02/11/15  Yes [provider]  predniSONE (DELTASONE) 10 MG tablet Take 4 tabs for 3 days, then 3 tabs for 3 days, then 2 tabs for 3 days, then 1 tab for 3 days, then 1/2 tab for 4 days. Patient not taking: Reported on 06/14/2018 05/22/18   Dessa Phi, DO    Physical Exam: Wt Readings from Last 3 Encounters:  06/14/18 68.5 kg  06/05/18 71.1 kg  05/12/18 68.9 kg   Vitals:   06/14/18 0919 06/14/18 1026 06/14/18 1129 06/14/18 1222  BP: (!) 107/52 (!) 103/53 (!) 126/52 (!) 127/51  Pulse: (!) 128 (!) 122 (!) 125 (!) 122  Resp: 20 20 (!) 22 20  Temp: 98.4 F (36.9 C)     TempSrc: Oral     SpO2: 95% 98% 95% 94%  Weight:   68.5 kg   Height:   5\' 5"  (1.651 m)       Constitutional:  Calm & comfortable Eyes: PERRLA, lids and conjunctivae normal ENT:  Mucous membranes are dry  Pharynx clear of exudate   Normal dentition.  Neck: Supple, no masses  Respiratory:  Mild rhonchi, no wheezing. Very short of breath from transitioning from chair to bed but normal respiratory effort at rest- pulse ox is 94% on 3.5 L O2  Cardiovascular:  S1 & S2 heard, regular rate and rhythm- tachycardic up to 130s when transferring and about 100-120 at rest No Murmurs Abdomen:  Non distended No tenderness, No  masses Bowel sounds normal Extremities:  No clubbing / cyanosis No pedal edema No joint deformity    Skin:  Small shallow ulcer or skin tear on left lower exteremly which is covered with a dressing and is healing well per paient Neurologic:  AAO x 3 CN 2-12 grossly intact Sensation intact Strength 5/5 in all 4 extremities Psychiatric:  Normal Mood and affect    Labs on Admission: I have personally reviewed following labs and imaging studies  CBC: Recent Labs  Lab 06/14/18 0948  WBC 6.3  NEUTROABS 5.8  HGB 7.9*  HCT 24.8*  MCV 90.5  PLT 485   Basic Metabolic Panel: Recent Labs  Lab 06/14/18 0948  NA 136  K 4.2  CL 98  CO2 27  GLUCOSE 100*  BUN 24*  CREATININE 0.81  CALCIUM 8.7*   GFR: Estimated Creatinine Clearance: 55.7 mL/min (by C-G formula based on SCr of 0.81 mg/dL). Liver Function Tests: Recent Labs  Lab 06/14/18 0948  AST 24  ALT 18  ALKPHOS 77  BILITOT 0.8  PROT 6.4*  ALBUMIN 2.3*   No results for input(s): LIPASE, AMYLASE in the last 168 hours. No results for input(s): AMMONIA in the last 168 hours. Coagulation Profile: No results for input(s): INR, PROTIME in the last 168 hours. Cardiac Enzymes: Recent Labs  Lab 06/14/18 0948  TROPONINI 0.05*   BNP (last 3 results) No results for input(s): PROBNP in the last 8760 hours. HbA1C: No results for input(s): HGBA1C in the last 72 hours. CBG: No results for input(s): GLUCAP in the last 168 hours. Lipid Profile: No results for input(s): CHOL, HDL, LDLCALC, TRIG, CHOLHDL, LDLDIRECT in the last 72 hours. Thyroid Function Tests: No results for input(s): TSH, T4TOTAL, FREET4, T3FREE, THYROIDAB in the last 72 hours. Anemia Panel: No results for input(s): VITAMINB12, FOLATE, FERRITIN, TIBC, IRON, RETICCTPCT in the last 72 hours. Urine analysis:    Component Value Date/Time   COLORURINE YELLOW 05/07/2018 0521  APPEARANCEUR CLEAR 05/07/2018 0521   LABSPEC 1.010 05/07/2018 0521   PHURINE  5.0 05/07/2018 0521   GLUCOSEU NEGATIVE 05/07/2018 0521   HGBUR NEGATIVE 05/07/2018 0521   BILIRUBINUR NEGATIVE 05/07/2018 0521   KETONESUR 5 (A) 05/07/2018 0521   PROTEINUR NEGATIVE 05/07/2018 0521   NITRITE NEGATIVE 05/07/2018 0521   LEUKOCYTESUR NEGATIVE 05/07/2018 0521   Sepsis Labs: @LABRCNTIP (procalcitonin:4,lacticidven:4) )No results found for this or any previous visit (from the past 240 hour(s)).   Radiological Exams on Admission: Dg Chest 2 View  Result Date: 06/14/2018 CLINICAL DATA:  Hypoxia, shortness of breath, lung cancer EXAM: CHEST - 2 VIEW COMPARISON:  06/10/2018 FINDINGS: Masslike right lower lobe opacity, possibly reflecting combination of tumor and/or superimposed infection, progressed. Suspected small right pleural effusion. Left lung is clear. No pneumothorax. The heart is normal in size. IMPRESSION: Progressive masslike right lower lobe opacity, possibly reflecting combination of tumor and/or superimposed infection, with associated small right pleural effusion. Electronically Signed   By: Julian Hy M.D.   On: 06/14/2018 10:27    EKG: Independently reviewed. Sinus tachy at 124 bpm  Assessment/Plan Principal Problem:   Acute on chronic respiratory failure with hypoxia H/o COPD and Lung CA - I am not entirely sure she has a pneumonia as she has cough with clear sputum today, no fever, no leukocytosis - ? COPD flare- the symptoms seem more chronic than acute but she has been treated with Prednisone and Nebs and I will continue these - ? Progression of tumor or PE - I will obtain a CTA for further work up - in the mean time, cont Nebs, home inhalers, O2 to keep pulse ox > 90%, transfuse blood (see below)  Active Problems:    Anemia  - Hb has steadily dropped from 9-10 range to 7.9 today - I feel she is dehydrated as well and if she was further hydrated with IVF, Hb may drop further - slow drop may have been due to ongoing hemoptysis   - I have discussed  giving 1 U PRBC and they are in agreement with this- it may also  help her O2 carrying capacity - she is on Iron tabs, O24 and Folic acid which can be continued- last anemia panel in July suggestive of AOCD  Adenocarcinoma IIIA/IV Mass in RLL - has received radiation treatment but not much improvement seen per family - has had a discussion with Dr Julien Nordmann and the next step is to obtain a PET scan this coming Wednesday to decide on further treatment  Dehydration - slow IVF after blood transfused- cause is likely poor oral intake  Indigestion - this is the cause of her poor oral intake- as she is currently on Steroids, symptoms may get worse-  cont Pepcid and add Protoninx and Tums  Weight loss - add Ensure TID she she states she will be happy to drink      LUPUS ERYTHEMATOSUS, DISCOID  HLD -cont Statin  DVT prophylaxis: SCDs for now Code Status: DNR  Family Communication: husband and daughter  Disposition Plan: further work up pending  Consults called: linked Dr Julien Nordmann to treatment team  Admission status: inpatient    Debbe Odea MD [facility] Pager: [email or web address] Password [email or web address] 7PM-7AM, please contact night-coverage   06/14/2018, 12:48 PM

## 2018-06-14 NOTE — Progress Notes (Signed)
A consult was received from an ED physician for vancomycin and cefepime per pharmacy dosing.  The patient's profile has been reviewed for ht/wt/allergies/indication/available labs.    A one time order has been placed for vancomycin 1gm IV and cefepime 2gm IV x1 .  Further antibiotics/pharmacy consults should be ordered by admitting physician if indicated.                       Thank you, Lynelle Doctor 06/14/2018  10:59 AM

## 2018-06-14 NOTE — ED Notes (Signed)
Bed: XT02 Expected date:  Expected time:  Means of arrival:  Comments: 77 yo low O2 sats, Lung CA

## 2018-06-14 NOTE — ED Provider Notes (Addendum)
[facility] DEPT Provider Note   CSN: [id number] Arrival date & time: 06/14/18  0910     History   Chief Complaint Chief Complaint  Patient presents with  . Shortness of Breath    HPI Casey Wilkinson is a 77 y.o. female.  The history is provided by the patient.  Shortness of Breath  This is a new problem. The current episode started yesterday. The problem has been gradually worsening. Associated symptoms include cough, sputum production, hemoptysis and wheezing. Pertinent negatives include no fever, no headaches, no sore throat, no neck pain, no chest pain, no syncope, no vomiting, no rash, no leg pain and no leg swelling. It is unknown what precipitated the problem. She has tried beta-agonist inhalers for the symptoms. The treatment provided mild relief. She has had prior hospitalizations. She has had prior ED visits. Associated medical issues include COPD and pneumonia.    Very pleasant 77 year old female, primary patient of Dr. Shon Baton, has been diagnosed with COPD and is on 2 L of oxygen chronically at her nursing facility at [facility].  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  She denies chemotherapy or surgery.  She recently presented to the hospital on July 23 and was admitted for over 10 days for treatment of COPD, a CT scan of the chest at that time showed a persistent right lower lobe collapse and infiltrate and a small pleural effusion for which she was treated with antibiotics and steroids.  Ultimately she was diagnosed with a postobstructive pneumonia  She presents today with increasing shortness of breath, tachycardia and was noted by the nurses at the facility to have oxygen levels of 84%.  She was given 2 nebulized treatments and by the time the paramedics arrived her oxygen saturations  were up to 90% on her baseline 2 L.  The patient does report and endorse that she has ongoing shortness of breath, she has had a persistent cough for quite some time and actually has had some hemoptysis as well.  She denies swelling in her legs and actually states that the swelling is improving after going to the nursing facility with some good wound care.  Further review of the medical record shows that she had a CT angiogram performed on July 23 which confirmed no pulmonary embolism with persistent right lower lobe collapse and effusion.  Past Medical History:  Diagnosis Date  . Anemia   . Arthritis    "all over"  . Carotid artery stenosis    right side followed by [facility]- 65%  . Colitis April 2016  . Colon cancer ([place])   . COPD (chronic obstructive pulmonary disease) ([place])   . Discoid lupus erythematosus   . Diverticulitis April 2016   and Colitis  . History of blood transfusion 01/2015, 06/2015   colectomy  . Hypertension   . Psoriatic arthritis ([place])   . Shortness of breath dyspnea    with exertion  . Squamous cell carcinoma, leg     Patient Active Problem List   Diagnosis Date Noted  . Goals of care, counseling/discussion 06/07/2018  . HCAP (healthcare-associated pneumonia) 05/12/2018  . Adenocarcinoma of right lung ([place])   . Sepsis due to pneumonia ([place]) 05/06/2018  . S/P bronchoscopy with biopsy   . Pleuritic pain 04/30/2018  . Hemoptysis 04/30/2018  . Right lower lobe pneumonia ([place]) 03/31/2018  .  Chronic rhinitis 11/04/2017  . Pain and swelling of right lower leg 05/05/2017  . Acute on chronic respiratory failure with hypoxia ([place]) 03/12/2017  . Pain of right hand 01/28/2017  . 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 Degenerative arthritis of left knee 01/31/2016  . 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 Nausea with vomiting   . Acute ischemic colitis ([place])   . Rectal bleeding   . Abdominal pain   . Blood in stool   . Discoid lupus erythematosus  02/06/2015  . Colitis 02/06/2015  . Abdominal mass 02/06/2015  . HTN (hypertension) 02/06/2015  . Peripheral vascular disease ([place]) 02/24/2014  . LUPUS ERYTHEMATOSUS, DISCOID 11/21/2009  . Essential hypertension 12/08/2008  . COPD (chronic obstructive pulmonary disease) ([place]) 12/08/2008    Past Surgical History:  Procedure Laterality Date  . APPENDECTOMY  06/2015  . CATARACT EXTRACTION W/ INTRAOCULAR LENS  IMPLANT, BILATERAL Bilateral 2017  . COLONOSCOPY  06/23/15  . COLONOSCOPY W/ POLYPECTOMY    . CYSTOCELE REPAIR  2006  . DILATION AND CURETTAGE OF UTERUS  X 2  . EXCISIONAL HEMORRHOIDECTOMY  2006  . JOINT REPLACEMENT    . LAPAROSCOPIC PARTIAL COLECTOMY N/A 06/23/2015   Procedure: LAPAROSCOPIC ILEOCECETOMY;  Surgeon: Excell Seltzer, MD;  Location: [facility];  Service: General;  Laterality: N/A;  . MOHS SURGERY Left    "basal; LLE"  . RECTOCELE REPAIR  2006  . SQUAMOUS CELL CARCINOMA EXCISION  "several"   "legs mostly"  . TOTAL KNEE ARTHROPLASTY Left 01/31/2016   Procedure: LEFT TOTAL KNEE ARTHROPLASTY;  Surgeon: Leandrew Koyanagi, MD;  Location: [facility];  Service: Orthopedics;  Laterality: Left;  Marland Kitchen VAGINAL HYSTERECTOMY  1968   "total"  . VIDEO BRONCHOSCOPY Bilateral 05/06/2018   Procedure: VIDEO BRONCHOSCOPY WITH FLUORO;  Surgeon: Collene Gobble, MD;  Location: [facility];  Service: Cardiopulmonary;  Laterality: Bilateral;     OB History   None      Home Medications    Prior to Admission medications   Medication Sig Start Date End Date Taking? Authorizing Provider  acetaminophen (TYLENOL) 325 MG tablet Take 1,300 mg by mouth every 8 (eight) hours as needed for mild pain.   Yes [provider]  albuterol (PROAIR HFA) 108 (90 Base) MCG/ACT inhaler Inhale 2 puffs into the lungs every 4 (four) hours as needed for wheezing or shortness of breath. 03/31/18  Yes Collene Gobble, MD  atorvastatin (LIPITOR) 10 MG tablet Take 10 mg by mouth daily.   Yes [provider]    Calcium Carbonate-Vitamin D 600-400 MG-UNIT per tablet Take 1 tablet by mouth every morning.    Yes [provider]  Cyanocobalamin (B-12) 2500 MCG TABS Take 2,500 mcg by mouth daily.   Yes [provider]  docusate sodium (COLACE) 100 MG capsule Take 100 mg by mouth at bedtime.    Yes [provider]  famotidine (PEPCID) 20 MG tablet Take 20 mg by mouth 2 (two) times daily.   Yes [provider]  folic acid (FOLVITE) 1 MG tablet Take 1 mg by mouth every morning.    Yes [provider]  guaiFENesin (MUCINEX) 600 MG 12 hr tablet Take 600 mg by mouth 2 (two) times daily as needed for cough.   Yes [provider]  HYDROcodone-homatropine (HYCODAN) 5-1.5 MG/5ML syrup Take 5 mLs by mouth at bedtime and may repeat dose one time if needed. 05/22/18  Yes Dessa Phi, DO  iron polysaccharides (NIFEREX) 150 MG capsule Take  150 mg by mouth daily.   Yes [provider]  levofloxacin (LEVAQUIN) 500 MG tablet Take 500 mg by mouth daily.   Yes [provider]  lidocaine (XYLOCAINE) 2 % solution caregiver: Mix 1part 2% viscous lidocaine, 1part H20. Swallow 55mL of diluted mixture, 33min before meals and at bedtime, up to QID 06/03/18  Yes Eppie Gibson, MD  loratadine (CLARITIN) 10 MG tablet Take 10 mg by mouth daily.   Yes [provider]  Multiple Vitamin (MULTIVITAMIN WITH MINERALS) TABS tablet Take 1 tablet by mouth daily.   Yes [provider]  polyethylene glycol (MIRALAX / GLYCOLAX) packet Take 17 g by mouth daily as needed for mild constipation.    Yes [provider]  Potassium 99 MG TABS Take 99 mg by mouth daily.   Yes [provider]  traMADol (ULTRAM) 50 MG tablet Take 50 mg by mouth every 6 (six) hours as needed for moderate pain.   Yes [provider]  TRELEGY ELLIPTA 100-62.5-25 MCG/INH AEPB USE 1 INHALATION DAILY Patient taking differently: Inhale 1 puff into the lungs daily.   02/26/18  Yes Collene Gobble, MD  valsartan-hydrochlorothiazide (DIOVAN-HCT) 160-12.5 MG per tablet Take 1 tablet by mouth every morning.  02/11/15  Yes [provider]  predniSONE (DELTASONE) 10 MG tablet Take 4 tabs for 3 days, then 3 tabs for 3 days, then 2 tabs for 3 days, then 1 tab for 3 days, then 1/2 tab for 4 days. Patient not taking: Reported on 06/14/2018 05/22/18   Dessa Phi, DO    Family History Family History  Problem Relation Age of Onset  . Heart disease Father        Before age 31  . Hyperlipidemia Father   . Hypertension Father   . Pneumonia Mother   . Alzheimer's disease Mother   . Colon cancer Neg Hx     Social History Social History   Tobacco Use  . Smoking status: Former Smoker    Packs/day: 0.50    Years: 50.00    Pack years: 25.00    Types: Cigarettes    Last attempt to quit: 12/05/2000    Years since quitting: 17.5  . Smokeless tobacco: Never Used  . Tobacco comment: started smoking in early 20s  Substance Use Topics  . Alcohol use: No    Alcohol/week: 0.0 standard drinks  . Drug use: No     Allergies   Prochlorperazine edisylate; Sulfamethoxazole; and Doxycycline   Review of Systems Review of Systems  Constitutional: Negative for fever.  HENT: Negative for sore throat.   Respiratory: Positive for cough, hemoptysis, sputum production, shortness of breath and wheezing.   Cardiovascular: Negative for chest pain, leg swelling and syncope.  Gastrointestinal: Negative for vomiting.  Musculoskeletal: Negative for neck pain.  Skin: Negative for rash.  Neurological: Negative for headaches.  All other systems reviewed and are negative.    Physical Exam Updated Vital Signs BP (!) 126/52   Pulse (!) 125   Temp 98.4 F (36.9 C) (Oral)   Resp (!) 22   Ht 5\' 5"  (1.651 m)   Wt 68.5 kg   SpO2 95%   BMI 25.13 kg/m   Physical Exam  Constitutional: She appears well-developed and well-nourished. She appears distressed ( Increased work  of breathing).  HENT:  Head: Normocephalic and atraumatic.  Mouth/Throat: Oropharynx is clear and moist. No oropharyngeal exudate.  Eyes: Pupils are equal, round, and reactive to light. Conjunctivae and EOM are normal. Right  eye exhibits no discharge. Left eye exhibits no discharge. No scleral icterus.  Neck: Normal range of motion. Neck supple. No JVD present. No thyromegaly present.  Cardiovascular: Regular rhythm, normal heart sounds and intact distal pulses. Exam reveals no gallop and no friction rub.  No murmur heard. Pulse of 130, strong pulses at the radial arteries, no significant edema or JVD  Pulmonary/Chest: She is in respiratory distress. She has wheezes. She has no rales.  Slight wheezing, occasional rales, decreased breath sounds diffusely, speaks in short and sentences  Abdominal: Soft. Bowel sounds are normal. She exhibits no distension and no mass. There is no tenderness.  Musculoskeletal: Normal range of motion. She exhibits no edema or tenderness.  Lymphadenopathy:    She has no cervical adenopathy.  Neurological: She is alert. Coordination normal.  Skin: Skin is warm and dry. No rash noted. No erythema.  Psychiatric: She has a normal mood and affect. Her behavior is normal.  Nursing note and vitals reviewed.    ED Treatments / Results  Labs (all labs ordered are listed, but only abnormal results are displayed) Labs Reviewed  CBC WITH DIFFERENTIAL/PLATELET - Abnormal; Notable for the following components:      Result Value   RBC 2.74 (*)    Hemoglobin 7.9 (*)    HCT 24.8 (*)    RDW 18.0 (*)    Lymphs Abs 0.3 (*)    All other components within normal limits  COMPREHENSIVE METABOLIC PANEL - Abnormal; Notable for the following components:   Glucose, Bld 100 (*)    BUN 24 (*)    Calcium 8.7 (*)    Total Protein 6.4 (*)    Albumin 2.3 (*)    All other components within normal limits  TROPONIN I - Abnormal; Notable for the following components:   Troponin I 0.05  (*)    All other components within normal limits  BRAIN NATRIURETIC PEPTIDE - Abnormal; Notable for the following components:   B Natriuretic Peptide 117.4 (*)    All other components within normal limits  CULTURE, BLOOD (ROUTINE X 2)  CULTURE, BLOOD (ROUTINE X 2)  URINALYSIS, ROUTINE W REFLEX MICROSCOPIC  I-STAT CG4 LACTIC ACID, ED    EKG EKG Interpretation  Date/Time:  Sunday June 14 2018 11:28:27 EDT Ventricular Rate:  124 PR Interval:    QRS Duration: 82 QT Interval:  299 QTC Calculation: 430 R Axis:   -126 Text Interpretation:  Sinus tachycardia Atrial premature complex Low voltage with right axis deviation Consider anterior infarct Baseline wander in lead(s) V6 since last tracing no significant change Confirmed by Noemi Chapel [id number]) on 06/14/2018 12:55:51 PM   Radiology Dg Chest 2 View  Result Date: 06/14/2018 CLINICAL DATA:  Hypoxia, shortness of breath, lung cancer EXAM: CHEST - 2 VIEW COMPARISON:  06/10/2018 FINDINGS: Masslike right lower lobe opacity, possibly reflecting combination of tumor and/or superimposed infection, progressed. Suspected small right pleural effusion. Left lung is clear. No pneumothorax. The heart is normal in size. IMPRESSION: Progressive masslike right lower lobe opacity, possibly reflecting combination of tumor and/or superimposed infection, with associated small right pleural effusion. Electronically Signed   By: Julian Hy M.D.   On: 06/14/2018 10:27    Procedures .Critical Care Performed by: Noemi Chapel, MD Authorized by: Noemi Chapel, MD   Critical care provider statement:    Critical care time (minutes):  35   Critical care time was exclusive of:  Separately billable procedures and treating other patients and teaching time   Critical  care was necessary to treat or prevent imminent or life-threatening deterioration of the following conditions:  Respiratory failure   Critical care was time spent personally by me on the  following activities:  Blood draw for specimens, development of treatment plan with patient or surrogate, discussions with consultants, evaluation of patient's response to treatment, examination of patient, obtaining history from patient or surrogate, ordering and performing treatments and interventions, ordering and review of laboratory studies, ordering and review of radiographic studies, pulse oximetry, re-evaluation of patient's condition and review of old charts   (including critical care time)  Medications Ordered in ED Medications  0.9 %  sodium chloride infusion (has no administration in time range)  vancomycin (VANCOCIN) IVPB 1000 mg/200 mL premix (has no administration in time range)  ceFEPIme (MAXIPIME) 2 g in sodium chloride 0.9 % 100 mL IVPB (has no administration in time range)  predniSONE (DELTASONE) tablet 40 mg (40 mg Oral Given 06/14/18 0947)  albuterol (PROVENTIL) (2.5 MG/3ML) 0.083% nebulizer solution 5 mg (5 mg Nebulization Given 06/14/18 1039)     Initial Impression / Assessment and Plan / ED Course  I have reviewed the triage vital signs and the nursing notes.  Pertinent labs & imaging results that were available during my care of the patient were reviewed by me and considered in my medical decision making (see chart for details).  Clinical Course as of Jun 14 1204  Sun Jun 14, 2018  1045 Chest x-ray shows interval increase in size of the right lower lobe area that could be consistent with a progressive infection or a postobstructive pneumonia.  X-ray interpreted by myself, I agree with the radiology interpretation as well   [BM]  1052 The patient is persistently tachycardic, will start antibiotics and strongly consider admission given her underlying COPD and ongoing need for increased oxygen therapy   [BM]    Clinical Course User Index [BM] Noemi Chapel, MD    The patient has increased work of breathing, oxygen goes down to 88% on increased amounts of oxygen up to  3 or 4 L at times.  I do not see any edema, she has been given multiple bronchodilators prehospital which likely contributes to her tachycardia, she has had hemoptysis predating her recent angiogram of the chest showing no PE making that less likely.  She has had increasing coughing, wheezing and has decreased lung sounds diffusely.  Will obtain a chest x-ray, she will need a work-up for pneumonia, steroids, she may need admission to the hospital as she continues to drop her oxygenation.  I discussed care with the hospitalist who will admit the patient to the hospital.  She has persistent hypoxia and requiring increased oxygenation, her anemia is slightly worse and her chest x-ray does not fact show an increasing in size right sided basilar infiltrate which could be postobstructive pneumonia, there is an effusion associated with this.  She has received broad-spectrum antibiotics to cover including cefepime and vancomycin as well as IV fluids.  The patient is critically ill with ongoing hypoxic respiratory failure.  Final Clinical Impressions(s) / ED Diagnoses   Final diagnoses:  HCAP (healthcare-associated pneumonia)  Respiratory distress    ED Discharge Orders    None       Noemi Chapel, MD 06/14/18 1205    Noemi Chapel, MD 06/14/18 [id number]

## 2018-06-14 NOTE — Consult Note (Signed)
Name: Casey Wilkinson MRN: [id number] DOB: 02/05/1941    ADMISSION DATE:  06/14/2018 CONSULTATION DATE: 06/14/2018  REFERRING MD : Dr. Wynelle Cleveland CHIEF COMPLAINT: Shortness of breath, fatigue, tachycardia  BRIEF PATIENT DESCRIPTION:  Patient with a recent diagnosis of adenocarcinoma of the lung 05/06/18 History of colon cancer in remission, History of chronic obstructive pulmonary disease on 2 L of oxygen Was recently hospitalized and treated for postobstructive pneumonia from 7/23-05/23/2018-discharged to a rehab facility where she has been doing well In the last couple of days noted to be more tachycardic, desaturations noted during activity Oxygen requirement gradually increasing Not able to ambulate as she was a few days back She does have a chronic cough, has had hemoptysis-according to patient this is getting better Denies any chest pains or chest discomfort Has not had any fevers or chills Denied any history of congestive heart failure, has had some leg swelling which is better today   SIGNIFICANT EVENTS  7//17/2019-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 Has had 10 courses of radiation treatments Recent hospitalization from 05/12/2018 to 05/23/2018-was treated for pneumonia Has been at rehab where she was doing well up until recently when she started decompensating  STUDIES:  CT scan of the chest reviewed-CT prior to her diagnosis noted 03/17/2018 CT scan 05/12/2018 was reviewed CT scan 06/14/2018 was reviewed by myself and compared to the previous There is improvement in consolidation in the right lower lobe, there is some volume loss however current CT is improved compared to previous   HISTORY OF PRESENT ILLNESS:   She was recovering from recent lengthy hospitalization when she was treated for pneumonia Had recently completed 10 courses of radiation treatment for lung cancer She was doing well for a few days and started desaturating with  tachycardia Hemoptysis is better Denies any fevers or chills Denies any increased cough Denies any chest pains or discomfort Appetite is still slightly decreased  PAST MEDICAL HISTORY :   has a past medical history of Anemia, Arthritis, Carotid artery stenosis, Colitis (April 2016), Colon cancer [facility]), COPD (chronic obstructive pulmonary disease) ([place]), Discoid lupus erythematosus, Diverticulitis (April 2016), History of blood transfusion (01/2015, 06/2015), Hypertension, Psoriatic arthritis ([place]), Shortness of breath dyspnea, and Squamous cell carcinoma, leg.  has a past surgical history that includes Excisional hemorrhoidectomy (2006); Rectocele repair (2006); Vaginal hysterectomy (1968); Colonoscopy (06/23/15); Colonoscopy w/ polypectomy; Laparoscopic partial colectomy (N/A, 06/23/2015); Cataract extraction w/ intraocular lens  implant, bilateral (Bilateral, 2017); Total knee arthroplasty (Left, 01/31/2016); Appendectomy (06/2015); Joint replacement; Dilation and curettage of uterus (X 2); Cystocele repair (2006); Squamous cell carcinoma excision ("several"); Mohs surgery (Left); and Video bronchoscopy (Bilateral, 05/06/2018). Prior to Admission medications   Medication Sig Start Date End Date Taking? Authorizing Provider  acetaminophen (TYLENOL) 325 MG tablet Take 1,300 mg by mouth every 8 (eight) hours as needed for mild pain.   Yes [provider]  albuterol (PROAIR HFA) 108 (90 Base) MCG/ACT inhaler Inhale 2 puffs into the lungs every 4 (four) hours as needed for wheezing or shortness of breath. 03/31/18  Yes Collene Gobble, MD  atorvastatin (LIPITOR) 10 MG tablet Take 10 mg by mouth daily.   Yes [provider]  Calcium Carbonate-Vitamin D 600-400 MG-UNIT per tablet Take 1 tablet by mouth every morning.    Yes [provider]  Cyanocobalamin (B-12) 2500 MCG TABS Take 2,500 mcg by mouth daily.   Yes [provider]  docusate sodium (COLACE) 100 MG capsule Take 100  mg by  mouth at bedtime.    Yes [provider]  famotidine (PEPCID) 20 MG tablet Take 20 mg by mouth 2 (two) times daily.   Yes [provider]  folic acid (FOLVITE) 1 MG tablet Take 1 mg by mouth every morning.    Yes [provider]  guaiFENesin (MUCINEX) 600 MG 12 hr tablet Take 600 mg by mouth 2 (two) times daily as needed for cough.   Yes [provider]  HYDROcodone-homatropine (HYCODAN) 5-1.5 MG/5ML syrup Take 5 mLs by mouth at bedtime and may repeat dose one time if needed. 05/22/18  Yes Dessa Phi, DO  iron polysaccharides (NIFEREX) 150 MG capsule Take 150 mg by mouth daily.   Yes [provider]  levofloxacin (LEVAQUIN) 500 MG tablet Take 500 mg by mouth daily.   Yes [provider]  lidocaine (XYLOCAINE) 2 % solution caregiver: Mix 1part 2% viscous lidocaine, 1part H20. Swallow 77mL of diluted mixture, 57min before meals and at bedtime, up to QID 06/03/18  Yes Eppie Gibson, MD  loratadine (CLARITIN) 10 MG tablet Take 10 mg by mouth daily.   Yes [provider]  Multiple Vitamin (MULTIVITAMIN WITH MINERALS) TABS tablet Take 1 tablet by mouth daily.   Yes [provider]  polyethylene glycol (MIRALAX / GLYCOLAX) packet Take 17 g by mouth daily as needed for mild constipation.    Yes [provider]  Potassium 99 MG TABS Take 99 mg by mouth daily.   Yes [provider]  traMADol (ULTRAM) 50 MG tablet Take 50 mg by mouth every 6 (six) hours as needed for moderate pain.   Yes [provider]  TRELEGY ELLIPTA 100-62.5-25 MCG/INH AEPB USE 1 INHALATION DAILY Patient taking differently: Inhale 1 puff into the lungs daily.  02/26/18  Yes Collene Gobble, MD  valsartan-hydrochlorothiazide (DIOVAN-HCT) 160-12.5 MG per tablet Take 1 tablet by mouth every morning.  02/11/15  Yes [provider]  predniSONE (DELTASONE) 10 MG tablet Take 4 tabs for 3 days, then 3 tabs for 3 days, then 2 tabs for 3  days, then 1 tab for 3 days, then 1/2 tab for 4 days. Patient not taking: Reported on 06/14/2018 05/22/18   Dessa Phi, DO   Allergies  Allergen Reactions  . Prochlorperazine Edisylate Nausea Only and Other (See Comments)    **COMPAZINE**   Stroke-like symptoms  . Sulfamethoxazole Nausea Only and Other (See Comments)    Pt taking Methotrexate, Sulfur drugs could cause SEVERE fatal reaction.  . Doxycycline Hives    FAMILY HISTORY:  family history includes Alzheimer's disease in her mother; Heart disease in her father; Hyperlipidemia in her father; Hypertension in her father; Pneumonia in her mother. SOCIAL HISTORY:  reports that she quit smoking about 17 years ago. Her smoking use included cigarettes. She has a 25.00 pack-year smoking history. She has never used smokeless tobacco. She reports that she does not drink alcohol or use drugs.  REVIEW OF SYSTEMS:   Constitutional: No fever or chills.  HENT: No nasal stuffiness or congestion Eyes: Negative for blurred vision, double vision Respiratory: Significant for cough, improving hemoptysis  Cardiovascular: No chest pains or chest discomfort Did have some leg edema recently Gastrointestinal: decreased appetite Genitourinary: Negative for dysuria, urgency, frequency, hematuria and flank pain.  Musculoskeletal: Negative for myalgias, back pain, joint pain and falls.  Skin: Negative for itching and rash.  Neurological: Denies any dizziness, no history of seizures Endo/Heme/Allergies: Negative for environmental allergies and polydipsia. Does not bruise/bleed easily.  SUBJECTIVE:   VITAL SIGNS: Temp:  [98.4 F (36.9 C)] 98.4 F (36.9 C) (08/25 0919) Pulse Rate:  [113-128] 113 (08/25 1400) Resp:  [18-25] 20 (08/25 1400) BP: (92-127)/(45-66) 124/66 (08/25 1400) SpO2:  [89 %-98 %] 95 % (08/25 1400) Weight:  [68.5 kg] 68.5 kg (08/25 1129)  PHYSICAL EXAMINATION: General: Elderly lady does appear comfortable Neuro: Moving all  extremities with no focal findings HEENT: Moist oral mucosa Cardiovascular: S1-S2 appreciated with no murmur Lungs: Decreased air entry at the right base, no rhonchi Abdomen: Bowel sounds appreciated Musculoskeletal: No clubbing, mild edema Skin: Skin is warm and dry  Recent Labs  Lab 06/14/18 0948  NA 136  K 4.2  CL 98  CO2 27  BUN 24*  CREATININE 0.81  GLUCOSE 100*   Recent Labs  Lab 06/14/18 0948  HGB 7.9*  HCT 24.8*  WBC 6.3  PLT 323   Dg Chest 2 View  Result Date: 06/14/2018 CLINICAL DATA:  Hypoxia, shortness of breath, lung cancer EXAM: CHEST - 2 VIEW COMPARISON:  06/10/2018 FINDINGS: Masslike right lower lobe opacity, possibly reflecting combination of tumor and/or superimposed infection, progressed. Suspected small right pleural effusion. Left lung is clear. No pneumothorax. The heart is normal in size. IMPRESSION: Progressive masslike right lower lobe opacity, possibly reflecting combination of tumor and/or superimposed infection, with associated small right pleural effusion. Electronically Signed   By: Julian Hy M.D.   On: 06/14/2018 10:27   Ct Chest W Contrast  Result Date: 06/14/2018 CLINICAL DATA:  Productive cough. Hemoptysis and wheezing. Progressive right lower lobe opacity on chest radiography. EXAM: CT CHEST WITH CONTRAST TECHNIQUE: Multidetector CT imaging of the chest was performed during intravenous contrast administration. CONTRAST:  37mL OMNIPAQUE IOHEXOL 300 MG/ML  SOLN COMPARISON:  05/12/2017 FINDINGS: Cardiovascular: Coronary, aortic arch, and branch vessel atherosclerotic vascular disease. Mediastinum/Nodes: Scattered small mediastinal lymph nodes including an 8 mm paratracheal node on image 67/2 which is stable, and a 9 mm right hilar lymph node. Currently no overtly pathologic adenopathy. Lungs/Pleura: There is consolidation of much of the right lung with some sparing of the superior segment. Air bronchograms within the consolidated portion. No  central obstructing bronchial lesion. However, within the consolidated portion, there several hypodense areas including a 2.0 by 2.0 peripheral hypodensity on image 117/2 and a 2.9 by 1.4 cm posterior hypodensity on image 119/2. Neither of these 2 hypodense areas was readily apparent on 05/12/2018. Several other vague hypodense areas are noted within the right lower lobe consolidation. Severe centrilobular emphysema. 0.8 by 0.5 cm left upper lobe nodule on image 101/7, stable. 0.7 by 0.5 cm left lower lobe nodule on image 106/7, stable. There is atelectasis medially in the left lower lobe. Upper Abdomen: Hypodense triangular regions in the spleen are probably from splenic infarcts particularly in light of the faint perisplenic edema, and less likely to be due to early vascular phase. Advanced atherosclerosis of the splenic artery noted. Advanced abdominal aortic atherosclerosis noted with extensive plaque formation intruding on the lumen just below the level of the renal arteries. Stable nodularity of the left adrenal gland. Musculoskeletal: Thoracic spondylosis. IMPRESSION: 1. Continued dense consolidation in the right lower lobe with sparing of the superior segment. There are several new hypodense regions within the consolidated right lower lobe which could be from abscess, pulmonary infarct, or tumor. The consolidation could be from radiation pneumonitis, pneumonia, or less likely bronchopulmonary spread of tumor. There is no central truncation of the tracheobronchial tree. Borderline adenopathy in the chest. 2.  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 Origin uncertain but there is advanced atherosclerosis of the splenic artery and abdominal aorta. 3. Stable nodularity of the left adrenal gland. 4.  Aortic Atherosclerosis (ICD10-I70.0).  Coronary atherosclerosis. 5. Emphysema (ICD10-J43.9). 6. There are 2 stable left lung pulmonary nodules which warrants surveillance. Electronically Signed   By: Van Clines M.D.    On: 06/14/2018 13:16    ASSESSMENT / PLAN:  1.  Chronic obstructive pulmonary disease with exacerbation -Chronic disease, may be exacerbated at present -We will continue bronchodilator treatments -Does not appear to have an infectious exacerbation at present -Small course of steroids  2.  Extensive emphysema on CT scan of the chest -Extensive emphysematous changes -Decreased reserves  3.  Recent adenocarcinoma of the lung -She did tolerate treatment well -CT scan findings improved compared to previous  4.  Shortness of breath -Multifactorial reasons for shortness of breath including not having significant reserves, deconditioning, relative anemia  5.  Anemia -Hematocrit is down from recently -This may contribute to shortness of breath and increased oxygen requirement  6.  Hypertension -Appears controlled at present  7.  Hemoptysis -Hemoptysis is better compared to previous according to patient  8.  Recent healthcare associated pneumonia -This may have contributed to a significant deconditioning even though she was riding recently, multiple factors may have contributed to her current decompensation  She recently did have some leg edema, BNP is marginally increased compared to previous Will suggest to obtain an echocardiogram to assess cardiac function  Agree with unit of transfusion  I do not believe she needs any antibiotic therapy at present  Bronchodilator treatments and short course of steroids  Will follow  Discussed with Dr. Kathlen Mody findings and plan of care  [facility] and [place] Pager: [phone]  06/14/2018, 2:23 PM

## 2018-06-14 NOTE — ED Notes (Signed)
Date and time results received: 06/14/18 1046 (use smartphrase ".now" to insert current time)  Test: Trop Critical Value: 0.05  Name of Provider Notified: Sabra Heck  Orders Received? Or Actions Taken?: Actions Taken: Chiropodist and EDP

## 2018-06-14 NOTE — ED Notes (Signed)
ED TO INPATIENT HANDOFF REPORT  Name/Age/Gender Casey Wilkinson 77 y.o. female  Code Status    Code Status Orders  (From admission, onward)         Start     Ordered   06/14/18 1755  Do not attempt resuscitation (DNR)  Continuous    Question Answer Comment  In the event of cardiac or respiratory ARREST Do not call a "code blue"   In the event of cardiac or respiratory ARREST Do not perform Intubation, CPR, defibrillation or ACLS   In the event of cardiac or respiratory ARREST Use medication by any route, position, wound care, and other measures to relive pain and suffering. May use oxygen, suction and manual treatment of airway obstruction as needed for comfort.      06/14/18 1755        Code Status History    Date Active Date Inactive Code Status Order ID Comments User Context   06/14/2018 1235 06/14/2018 1755 DNR [id number]  Debbe Odea, MD ED   05/12/2018 2042 05/23/2018 1526 Full Code [id number]  Etta Quill, DO ED   05/06/2018 2357 05/09/2018 1545 Full Code [id number]  Norval Morton, MD ED   02/05/2016 1643 02/07/2016 1342 Full Code [id number]  Leandrew Koyanagi, MD Inpatient   06/23/2015 1716 06/29/2015 1317 Full Code [id number]  Excell Seltzer, MD Inpatient   02/06/2015 1558 02/12/2015 1940 Full Code [id number]  Nita Sells, MD ED    Advance Directive Documentation     Most Recent Value  Type of Advance Directive  Living will  Pre-existing out of facility DNR order (yellow form or pink MOST form)  -  "MOST" Form in Place?  -      Home/SNF/Other Home  Chief Complaint shortness of breath  Level of Care/Admitting Diagnosis ED Disposition    ED Disposition Condition [place]: [facility] [100102]  Level of Care: Med-Surg [16]  Diagnosis: Hypoxia [270623]  Admitting Physician: [place], [place]  Attending Physician: Debbe Odea [[id number]]  Estimated length of stay: past midnight tomorrow  Certification:: I certify  this patient will need inpatient services for at least 2 midnights  PT Class (Do Not Modify): Inpatient [101]  PT Acc Code (Do Not Modify): Private [1]       Medical History Past Medical History:  Diagnosis Date  . Anemia   . Arthritis    "all over"  . Carotid artery stenosis    right side followed by [facility]- 65%  . Colitis April 2016  . Colon cancer ([place])   . COPD (chronic obstructive pulmonary disease) ([place])   . Discoid lupus erythematosus   . Diverticulitis April 2016   and Colitis  . History of blood transfusion 01/2015, 06/2015   colectomy  . Hypertension   . Psoriatic arthritis ([place])   . Shortness of breath dyspnea    with exertion  . Squamous cell carcinoma, leg     Allergies Allergies  Allergen Reactions  . Prochlorperazine Edisylate Nausea Only and Other (See Comments)    **COMPAZINE**   Stroke-like symptoms  . Sulfamethoxazole Nausea Only and Other (See Comments)    Pt taking Methotrexate, Sulfur drugs could cause SEVERE fatal reaction.  . Doxycycline Hives    IV Location/Drains/Wounds Patient Lines/Drains/Airways Status   Active Line/Drains/Airways    Name:   Placement date:   Placement time:   Site:   Days:   Peripheral IV 06/14/18 Right Antecubital   06/14/18  0946    Antecubital   less than 1   External Urinary Catheter   05/13/18    0300    -   32          Labs/Imaging Results for orders placed or performed during the hospital encounter of 06/14/18 (from the past 48 hour(s))  CBC with Differential/Platelet     Status: Abnormal   Collection Time: 06/14/18  9:48 AM  Result Value Ref Range   WBC 6.3 4.0 - 10.5 K/uL   RBC 2.74 (L) 3.87 - 5.11 MIL/uL   Hemoglobin 7.9 (L) 12.0 - 15.0 g/dL   HCT 24.8 (L) 36.0 - 46.0 %   MCV 90.5 78.0 - 100.0 fL   MCH 28.8 26.0 - 34.0 pg   MCHC 31.9 30.0 - 36.0 g/dL   RDW 18.0 (H) 11.5 - 15.5 %   Platelets 323 150 - 400 K/uL   Neutrophils Relative % 92 %   Neutro Abs 5.8 1.7 - 7.7 K/uL   Lymphocytes Relative 5 %    Lymphs Abs 0.3 (L) 0.7 - 4.0 K/uL   Monocytes Relative 3 %   Monocytes Absolute 0.2 0.1 - 1.0 K/uL   Eosinophils Relative 0 %   Eosinophils Absolute 0.0 0.0 - 0.7 K/uL   Basophils Relative 0 %   Basophils Absolute 0.0 0.0 - 0.1 K/uL    Comment: Performed at [facility], [place] [address] [place], [place] [address]  Comprehensive metabolic panel     Status: Abnormal   Collection Time: 06/14/18  9:48 AM  Result Value Ref Range   Sodium 136 135 - 145 mmol/L   Potassium 4.2 3.5 - 5.1 mmol/L   Chloride 98 98 - 111 mmol/L   CO2 27 22 - 32 mmol/L   Glucose, Bld 100 (H) 70 - 99 mg/dL   BUN 24 (H) 8 - 23 mg/dL   Creatinine, Ser 0.81 0.44 - 1.00 mg/dL   Calcium 8.7 (L) 8.9 - 10.3 mg/dL   Total Protein 6.4 (L) 6.5 - 8.1 g/dL   Albumin 2.3 (L) 3.5 - 5.0 g/dL   AST 24 15 - 41 U/L   ALT 18 0 - 44 U/L   Alkaline Phosphatase 77 38 - 126 U/L   Total Bilirubin 0.8 0.3 - 1.2 mg/dL   GFR calc non Af Amer >60 >60 mL/min   GFR calc Af Amer >60 >60 mL/min    Comment: (NOTE) The eGFR has been calculated using the CKD EPI equation. This calculation has not been validated in all clinical situations. eGFR's persistently <60 mL/min signify possible Chronic Kidney Disease.    Anion gap 11 5 - 15    Comment: Performed at [facility], [place] [address], [place], [place] [address]  Troponin I     Status: Abnormal   Collection Time: 06/14/18  9:48 AM  Result Value Ref Range   Troponin I 0.05 (HH) <0.03 ng/mL    Comment: CRITICAL RESULT CALLED TO, READ BACK BY AND VERIFIED WITH: S CLAPP,RN 06/14/18 1046 RHOLMES Performed at [facility], [place] [address] [place], [place] [address]   Brain natriuretic peptide     Status: Abnormal   Collection Time: 06/14/18  9:48 AM  Result Value Ref Range   B Natriuretic Peptide 117.4 (H) 0.0 - 100.0 pg/mL    Comment: Performed at [facility], [place] [address], [place], [place] [address]  Blood  culture (routine x 2)     Status:  None (Preliminary result)   Collection Time: 06/14/18  9:48 AM  Result Value Ref Range   Specimen Description      BLOOD RIGHT ARM Performed at [place] [address], [place], [place] [address]    Special Requests      BOTTLES DRAWN AEROBIC AND ANAEROBIC Blood Culture adequate volume Performed at [facility], [place] [address], [place], [place] [address]    Culture PENDING    Report Status PENDING   I-Stat CG4 Lactic Acid, ED     Status: None   Collection Time: 06/14/18  9:54 AM  Result Value Ref Range   Lactic Acid, Venous 0.98 0.5 - 1.9 mmol/L  Blood culture (routine x 2)     Status: None (Preliminary result)   Collection Time: 06/14/18 10:02 AM  Result Value Ref Range   Specimen Description      BLOOD LEFT ARM Performed at [place] [address], [place], [place] [address]    Special Requests      BOTTLES DRAWN AEROBIC AND ANAEROBIC Blood Culture adequate volume Performed at [facility], [place] [address] [place], [place] [address]    Culture PENDING    Report Status PENDING   Prepare RBC     Status: None   Collection Time: 06/14/18 12:53 PM  Result Value Ref Range   Order Confirmation      ORDER PROCESSED BY BLOOD BANK Performed at [facility], [place] [address], [place], [place] [address]   Type and screen [place]     Status: None (Preliminary result)   Collection Time: 06/14/18  4:31 PM  Result Value Ref Range   ABO/RH(D) O POS    Antibody Screen NEG    Sample Expiration 06/17/2018    Unit Number [id number]    Blood Component Type RED CELLS,LR    Unit division 00    Status of Unit ISSUED    Transfusion Status OK TO TRANSFUSE    Crossmatch Result      Compatible Performed at [facility], [place] [address] [place],  [address]   Urinalysis, Routine w reflex microscopic     Status: Abnormal   Collection  Time: 06/14/18  5:40 PM  Result Value Ref Range   Color, Urine YELLOW YELLOW   APPearance CLEAR CLEAR   Specific Gravity, Urine 1.043 (H) 1.005 - 1.030   pH 6.0 5.0 - 8.0   Glucose, UA NEGATIVE NEGATIVE mg/dL   Hgb urine dipstick NEGATIVE NEGATIVE   Bilirubin Urine NEGATIVE NEGATIVE   Ketones, ur 5 (A) NEGATIVE mg/dL   Protein, ur NEGATIVE NEGATIVE mg/dL   Nitrite NEGATIVE NEGATIVE   Leukocytes, UA NEGATIVE NEGATIVE    Comment: Performed at [place] [address] [place],  [address]   Dg Chest 2 View  Result Date: 06/14/2018 CLINICAL DATA:  Hypoxia, shortness of breath, lung cancer EXAM: CHEST - 2 VIEW COMPARISON:  06/10/2018 FINDINGS: Masslike right lower lobe opacity, possibly reflecting combination of tumor and/or superimposed infection, progressed. Suspected small right pleural effusion. Left lung is clear. No pneumothorax. The heart is normal in size. IMPRESSION: Progressive masslike right lower lobe opacity, possibly reflecting combination of tumor and/or superimposed infection, with associated small right pleural effusion. Electronically Signed   By: Julian Hy M.D.   On: 06/14/2018 10:27   Ct Chest W Contrast  Result Date: 06/14/2018 CLINICAL DATA:  Productive cough. Hemoptysis and wheezing. Progressive  right lower lobe opacity on chest radiography. EXAM: CT CHEST WITH CONTRAST TECHNIQUE: Multidetector CT imaging of the chest was performed during intravenous contrast administration. CONTRAST:  43m OMNIPAQUE IOHEXOL 300 MG/ML  SOLN COMPARISON:  05/12/2017 FINDINGS: Cardiovascular: Coronary, aortic arch, and branch vessel atherosclerotic vascular disease. Mediastinum/Nodes: Scattered small mediastinal lymph nodes including an 8 mm paratracheal node on image 67/2 which is stable, and a 9 mm right hilar lymph node. Currently no overtly pathologic adenopathy. Lungs/Pleura: There is consolidation of much of the right lung with some sparing of the  superior segment. Air bronchograms within the consolidated portion. No central obstructing bronchial lesion. However, within the consolidated portion, there several hypodense areas including a 2.0 by 2.0 peripheral hypodensity on image 117/2 and a 2.9 by 1.4 cm posterior hypodensity on image 119/2. Neither of these 2 hypodense areas was readily apparent on 05/12/2018. Several other vague hypodense areas are noted within the right lower lobe consolidation. Severe centrilobular emphysema. 0.8 by 0.5 cm left upper lobe nodule on image 101/7, stable. 0.7 by 0.5 cm left lower lobe nodule on image 106/7, stable. There is atelectasis medially in the left lower lobe. Upper Abdomen: Hypodense triangular regions in the spleen are probably from splenic infarcts particularly in light of the faint perisplenic edema, and less likely to be due to early vascular phase. Advanced atherosclerosis of the splenic artery noted. Advanced abdominal aortic atherosclerosis noted with extensive plaque formation intruding on the lumen just below the level of the renal arteries. Stable nodularity of the left adrenal gland. Musculoskeletal: Thoracic spondylosis. IMPRESSION: 1. Continued dense consolidation in the right lower lobe with sparing of the superior segment. There are several new hypodense regions within the consolidated right lower lobe which could be from abscess, pulmonary infarct, or tumor. The consolidation could be from radiation pneumonitis, pneumonia, or less likely bronchopulmonary spread of tumor. There is no central truncation of the tracheobronchial tree. Borderline adenopathy in the chest. 2. 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 Origin uncertain but there is advanced atherosclerosis of the splenic artery and abdominal aorta. 3. Stable nodularity of the left adrenal gland. 4.  Aortic Atherosclerosis (ICD10-I70.0).  Coronary atherosclerosis. 5. Emphysema (ICD10-J43.9). 6. There are 2 stable left lung pulmonary nodules which  warrants surveillance. Electronically Signed   By: WVan ClinesM.D.   On: 06/14/2018 13:16    Pending Labs Unresulted Labs (From admission, onward)   None      Vitals/Pain Today's Vitals   06/14/18 1700 06/14/18 1730 06/14/18 1800 06/14/18 1813  BP: (!) 104/56 (!) 112/59 (!) 115/59 (!) 115/59  Pulse: (!) 108 98 (!) 56 (!) 104  Resp: 20 16 (!) 26 16  Temp:    97.9 F (36.6 C)  TempSrc:      SpO2: 98% 99% 97%   Weight:      Height:      PainSc:        Isolation Precautions No active isolations  Medications Medications  pantoprazole (PROTONIX) EC tablet 40 mg (40 mg Oral Not Given 06/14/18 1823)  famotidine (PEPCID) tablet 20 mg (has no administration in time range)  folic acid (FOLVITE) tablet 1 mg (1 mg Oral Not Given 06/14/18 1824)  guaiFENesin (MUCINEX) 12 hr tablet 600 mg (has no administration in time range)  iron polysaccharides (NIFEREX) capsule 150 mg (150 mg Oral Not Given 06/14/18 1824)  multivitamin with minerals tablet 1 tablet (1 tablet Oral Not Given 06/14/18 1824)  traMADol (ULTRAM) tablet 50 mg (has no administration in time range)  ipratropium-albuterol (DUONEB) 0.5-2.5 (3) MG/3ML nebulizer solution 3 mL (has no administration in time range)  HYDROcodone-homatropine (HYCODAN) 5-1.5 MG/5ML syrup 5 mL (5 mLs Oral Not Given 06/14/18 1825)  feeding supplement (ENSURE ENLIVE) (ENSURE ENLIVE) liquid 237 mL (has no administration in time range)  iopamidol (ISOVUE-300) 61 % injection 75 mL (has no administration in time range)  0.9 %  sodium chloride infusion (Manually program via Guardrails IV Fluids) ( Intravenous Not Given 06/14/18 1825)  Fluticasone-Umeclidin-Vilant 100-62.5-25 MCG/INH AEPB 1 puff (1 puff Inhalation Not Given 06/14/18 1826)  0.9 %  sodium chloride infusion ( Intravenous New Bag/Given 06/14/18 1827)  calcium carbonate (TUMS - dosed in mg elemental calcium) chewable tablet 200 mg of elemental calcium (200 mg of elemental calcium Oral Refused 06/14/18  1827)  predniSONE (DELTASONE) tablet 10 mg (has no administration in time range)  acetaminophen (TYLENOL) tablet 650 mg (has no administration in time range)    Or  acetaminophen (TYLENOL) suppository 650 mg (has no administration in time range)  ondansetron (ZOFRAN) tablet 4 mg (has no administration in time range)    Or  ondansetron (ZOFRAN) injection 4 mg (has no administration in time range)  sodium chloride flush (NS) 0.9 % injection 3 mL (has no administration in time range)  sodium chloride flush (NS) 0.9 % injection 3 mL (has no administration in time range)  0.9 %  sodium chloride infusion (has no administration in time range)  predniSONE (DELTASONE) tablet 40 mg (40 mg Oral Given 06/14/18 0947)  albuterol (PROVENTIL) (2.5 MG/3ML) 0.083% nebulizer solution 5 mg (5 mg Nebulization Given 06/14/18 1039)  ceFEPIme (MAXIPIME) 2 g in sodium chloride 0.9 % 100 mL IVPB (0 g Intravenous Stopped 06/14/18 1252)  iohexol (OMNIPAQUE) 300 MG/ML solution 75 mL (75 mLs Intravenous Contrast Given 06/14/18 1246)    Mobility walks

## 2018-06-14 NOTE — ED Notes (Signed)
Pt cannot use restroom at this time, aware urine specimen is needed.  

## 2018-06-14 NOTE — ED Triage Notes (Signed)
EMS report from [facility], called out for low 02 Sat this morning 84 at facility. Lung CA Pt. EMS reports 90 Sp02 prior to leaving facility. Given two breathing treatments at facility prior to EMS arrival with positive effect. Uses two treatments per day. Pt states breathing normally again after treatments. Pt states on 2lts O2 24/7

## 2018-06-15 DIAGNOSIS — E876 Hypokalemia: Secondary | ICD-10-CM

## 2018-06-15 LAB — CBC WITH DIFFERENTIAL/PLATELET
BASOS ABS: 0 10*3/uL (ref 0.0–0.1)
BASOS PCT: 0 %
EOS PCT: 0 %
Eosinophils Absolute: 0 10*3/uL (ref 0.0–0.7)
HCT: 29.2 % — ABNORMAL LOW (ref 36.0–46.0)
Hemoglobin: 9.3 g/dL — ABNORMAL LOW (ref 12.0–15.0)
Lymphocytes Relative: 7 %
Lymphs Abs: 0.5 10*3/uL — ABNORMAL LOW (ref 0.7–4.0)
MCH: 29.3 pg (ref 26.0–34.0)
MCHC: 31.8 g/dL (ref 30.0–36.0)
MCV: 92.1 fL (ref 78.0–100.0)
Monocytes Absolute: 0.3 10*3/uL (ref 0.1–1.0)
Monocytes Relative: 5 %
Neutro Abs: 5.6 10*3/uL (ref 1.7–7.7)
Neutrophils Relative %: 88 %
PLATELETS: 287 10*3/uL (ref 150–400)
RBC: 3.17 MIL/uL — AB (ref 3.87–5.11)
RDW: 17.1 % — ABNORMAL HIGH (ref 11.5–15.5)
WBC: 6.4 10*3/uL (ref 4.0–10.5)

## 2018-06-15 LAB — BRAIN NATRIURETIC PEPTIDE: B Natriuretic Peptide: 336.3 pg/mL — ABNORMAL HIGH (ref 0.0–100.0)

## 2018-06-15 LAB — BASIC METABOLIC PANEL
ANION GAP: 10 (ref 5–15)
BUN: 17 mg/dL (ref 8–23)
CO2: 26 mmol/L (ref 22–32)
Calcium: 8.4 mg/dL — ABNORMAL LOW (ref 8.9–10.3)
Chloride: 105 mmol/L (ref 98–111)
Creatinine, Ser: 0.62 mg/dL (ref 0.44–1.00)
GFR calc Af Amer: >60 mL/min (ref >60)
Glucose, Bld: 111 mg/dL — ABNORMAL HIGH (ref 70–99)
POTASSIUM: 3.2 mmol/L — AB (ref 3.5–5.1)
Sodium: 141 mmol/L (ref 135–145)

## 2018-06-15 LAB — TYPE AND SCREEN
ABO/RH(D): O POS
Antibody Screen: NEGATIVE
UNIT DIVISION: 0

## 2018-06-15 LAB — BPAM RBC
Blood Product Expiration Date: 201909202359
ISSUE DATE / TIME: 201908251805
UNIT TYPE AND RH: 5100

## 2018-06-15 LAB — PROCALCITONIN: Procalcitonin: <0.1 ng/mL

## 2018-06-15 MED ORDER — BUDESONIDE 0.5 MG/2ML IN SUSP
0.5000 mg | Freq: Two times a day (BID) | RESPIRATORY_TRACT | Status: DC
  Administered 2018-06-15 – 2018-06-19 (×9): 0.5 mg via RESPIRATORY_TRACT
  Filled 2018-06-15 (×9): qty 2

## 2018-06-15 MED ORDER — IPRATROPIUM-ALBUTEROL 0.5-2.5 (3) MG/3ML IN SOLN
3.0000 mL | Freq: Four times a day (QID) | RESPIRATORY_TRACT | Status: DC
  Administered 2018-06-15 – 2018-06-16 (×5): 3 mL via RESPIRATORY_TRACT
  Filled 2018-06-15 (×5): qty 3

## 2018-06-15 MED ORDER — SODIUM CHLORIDE 3 % IN NEBU
4.0000 mL | INHALATION_SOLUTION | Freq: Two times a day (BID) | RESPIRATORY_TRACT | Status: AC
  Administered 2018-06-15 – 2018-06-17 (×5): 4 mL via RESPIRATORY_TRACT
  Filled 2018-06-15 (×6): qty 4

## 2018-06-15 MED ORDER — POTASSIUM CHLORIDE CRYS ER 20 MEQ PO TBCR
40.0000 meq | EXTENDED_RELEASE_TABLET | ORAL | Status: AC
  Administered 2018-06-15 (×2): 40 meq via ORAL
  Filled 2018-06-15: qty 2

## 2018-06-15 NOTE — Progress Notes (Addendum)
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 Recently hospitalized 7/23 to 8/1 for post-obstructive PNA. D/c to rehab.   -Completed 10 fractions of radiotherapy to the chest on 06/03/2018 Admitted 8/25 w/ working dx of AECOPD possibly exacerbated by anemia. 8/26 feels a little better, no fever or leukocytosis   Subjective  Feels better  Assessment & Plan:   Acute on chronic Hypoxic respiratory failure in setting of AECOPD superimposed on underlying adenocarcinoma of the lung w/ extensive right lower lobe consolidation of unclear etiology; superimposed on deconditioning and decreased physical reserves and exacerbated by anemia .  -Could reflect: radiation pneumonitis (she is s/p 10 cycles aug 14), atelectasis, pneumonia or worsening cancer.  -she clinically feels better.  -did receive one dose of abx.  Plan/rec Cont systemic steroids Cont BDs Add mucous clearance regimen: hypertonic saline, flutter valve & mucinex Will try medineb Ck PCT  Try to send sputum  Symptomatic anemia -s/p transfusion 8/25 & feels better Plan Trend CBC    Best Practice / Goals of Care / Disposition.   DVT prophylaxis: scd GI prophylaxis: pepcid Diet: reg Mobility:OOB Code Status: DNR Family Communication: pending   Disposition / Summary of Today's Plan 06/15/18   Cont to treat for AECOPD. Checking PCT adding pulm hygiene measures. Will likely need higher FIO2 at time of dc   Consultants:  pulm 8/25  Procedures:  SIGNIFICANT EVENTS  7//17/2019-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 Has had 10 courses of radiation treatments Recent hospitalization from 05/12/2018 to 05/23/2018-was treated for  pneumonia Has been at rehab where she was doing well up until recently when she started decompensating  STUDIES:  CT scan of the chest reviewed-CT prior to her diagnosis noted 03/17/2018 CT scan 05/12/2018 was reviewed CT scan 06/14/2018: There is improvement in consolidation in the right lower lobe, there is some volume loss however current CT is improved compared to previous severe emphysema changes   Micro Data: BCX2 8/25>>>  Antimicrobials:  Cefepime x 1 dose 8/25   Objective    Examination: General: frail 77 year old white female resting in bed. No distress  HENT: NCAT voice is hoarse. MMM no JVD Lungs: scattered rhonchi. Decreased bases R>L  Cardiovascular: RRR w/out MRG Abdomen: soft not tender + bowel sounds  Extremities: warm, dry brisk CR  Neuro: awake and alert  GU: voids   Blood pressure 137/69, pulse 93, temperature 98.1 F (36.7 C), temperature source Oral, resp. rate 18, height 5\' 5"  (1.651 m), weight 70.7 kg, SpO2 99 %.        Intake/Output Summary (Last 24 hours) at 06/15/2018 1212 Last data filed at 06/15/2018 0600 Gross per 24 hour  Intake 2799.4 ml  Output no documentation  Net 2799.4 ml   [facility] Weights   06/14/18 1129 06/14/18 1952  Weight: 68.5 kg 70.7 kg     Labs   CBC: Recent Labs  Lab 06/14/18 0948 06/14/18 2128 06/15/18 1022  WBC 6.3  --  6.4  NEUTROABS 5.8  --  5.6  HGB 7.9* 8.8* 9.3*  HCT 24.8* 27.1* 29.2*  MCV 90.5  --  92.1  PLT 323  --  449   Basic Metabolic Panel: Recent Labs  Lab 06/14/18 0948 06/15/18 1022  NA 136 141  K 4.2 3.2*  CL 98 105  CO2 27 26  GLUCOSE 100* 111*  BUN 24* 17  CREATININE 0.81 0.62  CALCIUM 8.7* 8.4*   GFR: Estimated Creatinine Clearance: 57.2 mL/min (by C-G formula based on SCr of 0.62 mg/dL). Recent Labs  Lab 06/14/18 0948 06/14/18 0954 06/15/18 1022  WBC 6.3  --  6.4  LATICACIDVEN  --  0.98  --    Liver Function Tests: Recent Labs  Lab 06/14/18 0948  AST 24  ALT 18  ALKPHOS  77  BILITOT 0.8  PROT 6.4*  ALBUMIN 2.3*   No results for input(s): LIPASE, AMYLASE in the last 168 hours. No results for input(s): AMMONIA in the last 168 hours. ABG No results found for: PHART, PCO2ART, PO2ART, HCO3, TCO2, ACIDBASEDEF, O2SAT  Coagulation Profile: No results for input(s): INR, PROTIME in the last 168 hours. Cardiac Enzymes: Recent Labs  Lab 06/14/18 0948  TROPONINI 0.05*   HbA1C: Hgb A1c MFr Bld  Date/Time Value Ref Range Status  06/19/2015 01:25 PM 5.6 4.8 - 5.6 % Final    Comment:    (NOTE)         Pre-diabetes: 5.7 - 6.4         Diabetes: >6.4         Glycemic control for adults with diabetes: <7.0    CBG: No results for input(s): GLUCAP in the last 168 hours.  Erick Colace ACNP-BC [place] Pager # [phone] OR # [phone] if no answer

## 2018-06-15 NOTE — NC FL2 (Addendum)
[place] LEVEL OF CARE SCREENING TOOL     IDENTIFICATION  Patient Name: SKILYNN DURNEY Birthdate: 09/07/1941 Sex: female Admission Date (Current Location): 06/14/2018  [facility] and [place] Number:  Herbalist and Address:  [facility],  [place], [place]      Provider Number: [id number]  Attending Physician Name and Address:  Eugenie Filler, MD  Relative Name and Phone Number:       Current Level of Care: Hospital Recommended Level of Care: [place] Prior Approval Number:    Date Approved/Denied:   PASRR Number: [id number]  Discharge Plan: SNF    Current Diagnoses: Patient Active Problem List   Diagnosis Date Noted  . Anemia 06/14/2018  . Hypoxia 06/14/2018  . Goals of care, counseling/discussion 06/07/2018  . HCAP (healthcare-associated pneumonia) 05/12/2018  . Adenocarcinoma of right lung ([place])   . Sepsis due to pneumonia ([place]) 05/06/2018  . S/P bronchoscopy with biopsy   . Pleuritic pain 04/30/2018  . Hemoptysis 04/30/2018  . Right lower lobe pneumonia ([place]) 03/31/2018  . Chronic rhinitis 11/04/2017  . Pain and swelling of right lower leg 05/05/2017  . Acute on chronic respiratory failure with hypoxia ([place]) 03/12/2017  . Pain of right hand 01/28/2017  . 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 Degenerative arthritis of left knee 01/31/2016  . 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 Nausea with vomiting   . Acute ischemic colitis ([place])   . Rectal bleeding   . Abdominal pain   . Blood in stool   . 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 Colitis 02/06/2015  . Abdominal mass 02/06/2015  . HTN (hypertension) 02/06/2015  . Peripheral vascular disease ([place]) 02/24/2014  . LUPUS ERYTHEMATOSUS, DISCOID 11/21/2009  . Essential hypertension 12/08/2008  . COPD (chronic obstructive pulmonary disease) ([place]) 12/08/2008    Orientation RESPIRATION BLADDER Height & Weight     Self,  Time, Situation, Place  O2(4L) Incontinent Weight: 155 lb 13.8 oz (70.7 kg) Height:  5\' 5"  (165.1 cm)  BEHAVIORAL SYMPTOMS/MOOD NEUROLOGICAL BOWEL NUTRITION STATUS      Continent Diet(Regular)  AMBULATORY STATUS COMMUNICATION OF NEEDS Skin   Extensive Assist Verbally                         Personal Care Assistance Level of Assistance  Bathing, Feeding, Dressing Bathing Assistance: Maximum assistance Feeding assistance: Independent Dressing Assistance: Maximum assistance     Functional Limitations Info  Sight, Speech, Hearing Sight Info: Adequate Hearing Info: Adequate Speech Info: Adequate    SPECIAL CARE FACTORS FREQUENCY  PT (By licensed PT), OT (By licensed OT)     PT Frequency: 5X/week OT Frequency: 5X/week            Contractures Contractures Info: Not present    Additional Factors Info  Code Status, Allergies Code Status Info: DNR Allergies Info: Prochlorperazine Edisylate, Sulfamethoxazole, Doxycycline           Current Medications (06/15/2018):  This is the current hospital active medication list Current Facility-Administered Medications  Medication Dose Route Frequency Provider Last Rate Last Dose  . 0.9 %  sodium chloride infusion (Manually program via Guardrails IV Fluids)   Intravenous Once Debbe Odea, MD      . 0.9 %  sodium chloride infusion   Intravenous Continuous Debbe Odea, MD 75 mL/hr at 06/15/18 0600    . 0.9 %  sodium chloride infusion  250 mL Intravenous  PRN Debbe Odea, MD      . acetaminophen (TYLENOL) tablet 650 mg  650 mg Oral Q6H PRN Debbe Odea, MD       Or  . acetaminophen (TYLENOL) suppository 650 mg  650 mg Rectal Q6H PRN Debbe Odea, MD      . budesonide (PULMICORT) nebulizer solution 0.5 mg  0.5 mg Nebulization BID Erick Colace, NP   0.5 mg at 06/15/18 1442  . calcium carbonate (TUMS - dosed in mg elemental calcium) chewable tablet 200 mg of elemental calcium  1 tablet Oral BID WC Rizwan, Saima, MD   200 mg of  elemental calcium at 06/15/18 1621  . famotidine (PEPCID) tablet 20 mg  20 mg Oral BID Debbe Odea, MD   20 mg at 06/15/18 0950  . feeding supplement (ENSURE ENLIVE) (ENSURE ENLIVE) liquid 237 mL  237 mL Oral TID BM Rizwan, Saima, MD   237 mL at 06/15/18 1359  . folic acid (FOLVITE) tablet 1 mg  1 mg Oral q morning - 10a Debbe Odea, MD   1 mg at 06/15/18 0949  . guaiFENesin (MUCINEX) 12 hr tablet 600 mg  600 mg Oral BID PRN Debbe Odea, MD      . HYDROcodone-homatropine (HYCODAN) 5-1.5 MG/5ML syrup 5 mL  5 mL Oral BID Debbe Odea, MD   5 mL at 06/15/18 0949  . iopamidol (ISOVUE-300) 61 % injection 75 mL  75 mL Intravenous Once PRN Rizwan, Saima, MD      . ipratropium-albuterol (DUONEB) 0.5-2.5 (3) MG/3ML nebulizer solution 3 mL  3 mL Nebulization QID Erick Colace, NP   3 mL at 06/15/18 1442  . iron polysaccharides (NIFEREX) capsule 150 mg  150 mg Oral Daily Debbe Odea, MD   150 mg at 06/15/18 0949  . multivitamin with minerals tablet 1 tablet  1 tablet Oral Daily Debbe Odea, MD   1 tablet at 06/15/18 0949  . ondansetron (ZOFRAN) tablet 4 mg  4 mg Oral Q6H PRN Debbe Odea, MD       Or  . ondansetron (ZOFRAN) injection 4 mg  4 mg Intravenous Q6H PRN Rizwan, Saima, MD      . pantoprazole (PROTONIX) EC tablet 40 mg  40 mg Oral BID Debbe Odea, MD   40 mg at 06/15/18 0949  . predniSONE (DELTASONE) tablet 10 mg  10 mg Oral Q breakfast Olalere, Adewale A, MD   10 mg at 06/15/18 0823  . sodium chloride flush (NS) 0.9 % injection 3 mL  3 mL Intravenous Q12H Rizwan, Saima, MD      . sodium chloride flush (NS) 0.9 % injection 3 mL  3 mL Intravenous PRN Rizwan, Saima, MD      . sodium chloride HYPERTONIC 3 % nebulizer solution 4 mL  4 mL Nebulization BID Erick Colace, NP   4 mL at 06/15/18 1442  . traMADol (ULTRAM) tablet 50 mg  50 mg Oral Q6H PRN Debbe Odea, MD         Discharge Medications: Please see discharge summary for a list of discharge medications.  Relevant  Imaging Results:  Relevant Lab Results:   Additional Information SSN: [place]se [place], [place]

## 2018-06-15 NOTE — Clinical Social Work Note (Signed)
Clinical Social Work Assessment  Patient Details  Name: Casey Wilkinson MRN: [id number] Date of Birth: [birth year]  Date of referral:  06/15/18               Reason for consult:  Facility Placement                Permission sought to share information with:  Facility Sport and exercise psychologist Permission granted to share information::  Yes, Verbal Permission Granted  Name::     Romig,David L  Agency::  SNF  Relationship::  Spouse  Contact Information:  [phone]  Housing/Transportation Living arrangements for the past 2 months:  [place], [place] of Information:  Patient Patient Interpreter Needed:  None Criminal Activity/Legal Involvement Pertinent to Current Situation/Hospitalization:  No - Comment as needed Significant Relationships:  Adult Children, Spouse Lives with:  Spouse Do you feel safe going back to the place where you live?  No Need for family participation in patient care:  Yes (Depensent with mobility)  Care giving concerns:   SNF placement Patient reports no concerns and plans to d/c back to [facility] to continue rehab before returning home with her spouse.   Social Worker assessment / plan:  Patient discharge to [facility] on 8/3 for short rehab. Patient admitted for shortness of breath and fast heart rate. Per the patient shortness of breath has been worsening and the SNF has been increasing O2 liters to about 5-6. Patient reports she still has difficulty ambulating but was working with Physical Therapy at the facility to regain her strength. Patient is hopeful to return to SNF after this admission.   Plan: SNF  Updated FL2 has been completed.   Employment status:  Retired Forensic scientist:  Medicare PT Recommendations:  [place] / Referral to community resources:  [place]  Patient/Family's Response to care: Agreeable and Responding well to care.   Patient/Family's  Understanding of and Emotional Response to Diagnosis, Current Treatment, and Prognosis:  Patient alert and oriented x4 and has a good understanding of her diagnosis and current treatment plan.    Emotional Assessment Appearance:  Developmentally appropriate Attitude/Demeanor/Rapport:    Affect (typically observed):  Accepting, Pleasant Orientation:  Oriented to Self, Oriented to Place, Oriented to  Time, Oriented to Situation Alcohol / Substance use:  Not Applicable Psych involvement (Current and /or in the community):  No (Comment)  Discharge Needs  Concerns to be addressed:  Discharge Planning Concerns Readmission within the last 30 days:  Yes Current discharge risk:  Dependent with Mobility Barriers to Discharge:  Continued Medical Work up   [employer], LCSW 06/15/2018, 4:04 PM

## 2018-06-15 NOTE — Progress Notes (Signed)
PROGRESS NOTE    Casey Wilkinson  OZD:[id number] DOB: [birth year] DOA: 06/14/2018 PCP: Shon Baton, MD    Brief Narrative:  Casey Wilkinson is a 77 y.o. 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  She underwent a thoracentesis on 06/10/18 and 50 cc where removed. She returns for a pulse ox of 84% at the SNF. She was given Neb treatments. Her family states that the SNF has steadily been going up on her O2 from 2 liters to about 5-6 L lately and her shortness of breath is not acute but has slowly been worsening for weeks. She can barely ambulate short distances anymore. He has a mild cough with is mostly productive of blood and this is unchanged for weeks now. This AM her cough was productive of clear sputum. She has not had any chest pain. She has not had any fevers or chills. She had pedal edema when she was getting radiation but this issues has resolved. No h/o CHF.   ED Course: Prednisone and Albuterol neb given, Vanc and Cefepime ordered    Assessment & Plan:   Principal Problem:   Acute on chronic respiratory failure with hypoxia (HCC) Active Problems:   COPD (chronic obstructive pulmonary disease) (HCC)   LUPUS ERYTHEMATOSUS, DISCOID   Adenocarcinoma of right lung (HCC)   Anemia   Hypoxia  #1 acute on chronic hypoxic respiratory failure in the setting of acute COPD exacerbation superimposed on underlying adenocarcinoma of the lung with extensive right lower lobe consolidation Patient presented worsening shortness of breath felt to be secondary to an acute COPD exacerbation and likely radiation pneumonitis patient status post 10 cycles, probable worsening cancer. Patient with clinical improvement.  Patient status post 1 dose of antibiotics however not on any current antibiotics.  Continue current  dose of steroids, scheduled bronchodilators.  Hyper tonic saline, flutter valve and Mucinex, chest PT added to current regimen per pulmonary.  Pulmonary following and appreciate input and recommendations.  2.  Anemia Patient noted to be anemic and on admission had a hemoglobin of 7.9.  Status post 1 unit packed red blood cells 06/14/2018 hemoglobin currently at 9.3.  Follow H&H.  3.  Hypokalemia Replete.  4.  Adenocarcinoma of the lung stage IIIa/IV/with extensive right lower lobe consolidation Patient has received radiation treatment.  Patient states was supposed to get a PET scan as recommended by her oncologist, Dr. Lorna Few and is scheduled to have this Wednesday.  Outpatient follow-up with oncology.  5.  Dehydration Hydrated.  Status post transfusion of packed red blood cells.  Saline lock IV fluids.  6.  Indigestion Continue Pepcid, PPI, Tums.   DVT prophylaxis: SCDs Code Status: DNR Family Communication: Updated patient, husband, brother at bedside. Disposition Plan: Likely back to skilled nursing facility when medically stable.   Consultants:   Pccm: Dr Ander Slade 06/14/2018  Procedures:   CT chest 06/14/2018  Chest x-ray 06/14/2018  Antimicrobials:   None   Subjective: Patient sitting up in bed family at bedside.  Still has shortness of breath has improved since admission.  Denies any chest pain.  Objective: Vitals:   06/14/18 1900 06/14/18 1952 06/14/18 2116 06/15/18 0445  BP: 115/62 115/69 130/70 137/69  Pulse: 99 (!) 105 95 93  Resp: 18 18 18 18   Temp:  97.7 F (36.5 C)  97.9 F (36.6 C) 98.1 F (36.7 C)  TempSrc:  Oral Oral Oral  SpO2: 99% 94% 98% 99%  Weight:  70.7 kg    Height:  5\' 5"  (1.651 m)      Intake/Output Summary (Last 24 hours) at 06/15/2018 1135 Last data filed at 06/15/2018 0600 Gross per 24 hour  Intake 2799.4 ml  Output -  Net 2799.4 ml   Filed Weights   06/14/18 1129 06/14/18 1952  Weight: 68.5 kg 70.7 kg     Examination:  General exam: nad Respiratory system: Minimal expiratory wheezing.  No rhonchi.  No crackles.  Normal rest tori effort.  Speaking in full sentences.  No use of accessory muscles of respiration.   Cardiovascular system: S1 & S2 heard, RRR. No JVD, murmurs, rubs, gallops or clicks. No pedal edema. Gastrointestinal system: Abdomen is nondistended, soft and nontender. No organomegaly or masses felt. Normal bowel sounds heard. Central nervous system: Alert and oriented. No focal neurological deficits. Extremities: Symmetric 5 x 5 power. Skin: No rashes, lesions or ulcers Psychiatry: Judgement and insight appear normal. Mood & affect appropriate.     Data Reviewed: I have personally reviewed following labs and imaging studies  CBC: Recent Labs  Lab 06/14/18 0948 06/14/18 2128 06/15/18 1022  WBC 6.3  --  6.4  NEUTROABS 5.8  --  5.6  HGB 7.9* 8.8* 9.3*  HCT 24.8* 27.1* 29.2*  MCV 90.5  --  92.1  PLT 323  --  008   Basic Metabolic Panel: Recent Labs  Lab 06/14/18 0948 06/15/18 1022  NA 136 141  K 4.2 3.2*  CL 98 105  CO2 27 26  GLUCOSE 100* 111*  BUN 24* 17  CREATININE 0.81 0.62  CALCIUM 8.7* 8.4*   GFR: Estimated Creatinine Clearance: 57.2 mL/min (by C-G formula based on SCr of 0.62 mg/dL). Liver Function Tests: Recent Labs  Lab 06/14/18 0948  AST 24  ALT 18  ALKPHOS 77  BILITOT 0.8  PROT 6.4*  ALBUMIN 2.3*   No results for input(s): LIPASE, AMYLASE in the last 168 hours. No results for input(s): AMMONIA in the last 168 hours. Coagulation Profile: No results for input(s): INR, PROTIME in the last 168 hours. Cardiac Enzymes: Recent Labs  Lab 06/14/18 0948  TROPONINI 0.05*   BNP (last 3 results) No results for input(s): PROBNP in the last 8760 hours. HbA1C: No results for input(s): HGBA1C in the last 72 hours. CBG: No results for input(s): GLUCAP in the last 168 hours. Lipid Profile: No results for input(s): CHOL, HDL, LDLCALC, TRIG,  CHOLHDL, LDLDIRECT in the last 72 hours. Thyroid Function Tests: No results for input(s): TSH, T4TOTAL, FREET4, T3FREE, THYROIDAB in the last 72 hours. Anemia Panel: No results for input(s): VITAMINB12, FOLATE, FERRITIN, TIBC, IRON, RETICCTPCT in the last 72 hours. Sepsis Labs: Recent Labs  Lab 06/14/18 0954  LATICACIDVEN 0.98    Recent Results (from the past 240 hour(s))  Blood culture (routine x 2)     Status: None (Preliminary result)   Collection Time: 06/14/18  9:48 AM  Result Value Ref Range Status   Specimen Description   Final    BLOOD RIGHT ARM Performed at [facility] [address], [place], [place] [address]    Special Requests   Final    BOTTLES DRAWN AEROBIC AND ANAEROBIC Blood Culture adequate volume   Culture   Final    NO GROWTH < 24 HOURS Performed at [facility], [place] [address],  [place], [place] [address]    Report Status PENDING  Incomplete  Blood culture (routine x 2)     Status: None (Preliminary result)   Collection Time: 06/14/18 10:02 AM  Result Value Ref Range Status   Specimen Description   Final    BLOOD LEFT ARM Performed at [facility] [address], [place], [place] [address]    Special Requests   Final    BOTTLES DRAWN AEROBIC AND ANAEROBIC Blood Culture adequate volume   Culture   Final    NO GROWTH < 24 HOURS Performed at [facility], [place] [address], [place], [place] [address]    Report Status PENDING  Incomplete         Radiology Studies: Dg Chest 2 View  Result Date: 06/14/2018 CLINICAL DATA:  Hypoxia, shortness of breath, lung cancer EXAM: CHEST - 2 VIEW COMPARISON:  06/10/2018 FINDINGS: Masslike right lower lobe opacity, possibly reflecting combination of tumor and/or superimposed infection, progressed. Suspected small right pleural effusion. Left lung is clear. No pneumothorax. The heart is normal in size. IMPRESSION: Progressive masslike right lower lobe opacity, possibly  reflecting combination of tumor and/or superimposed infection, with associated small right pleural effusion. Electronically Signed   By: Julian Hy M.D.   On: 06/14/2018 10:27   Ct Chest W Contrast  Result Date: 06/14/2018 CLINICAL DATA:  Productive cough. Hemoptysis and wheezing. Progressive right lower lobe opacity on chest radiography. EXAM: CT CHEST WITH CONTRAST TECHNIQUE: Multidetector CT imaging of the chest was performed during intravenous contrast administration. CONTRAST:  41mL OMNIPAQUE IOHEXOL 300 MG/ML  SOLN COMPARISON:  05/12/2017 FINDINGS: Cardiovascular: Coronary, aortic arch, and branch vessel atherosclerotic vascular disease. Mediastinum/Nodes: Scattered small mediastinal lymph nodes including an 8 mm paratracheal node on image 67/2 which is stable, and a 9 mm right hilar lymph node. Currently no overtly pathologic adenopathy. Lungs/Pleura: There is consolidation of much of the right lung with some sparing of the superior segment. Air bronchograms within the consolidated portion. No central obstructing bronchial lesion. However, within the consolidated portion, there several hypodense areas including a 2.0 by 2.0 peripheral hypodensity on image 117/2 and a 2.9 by 1.4 cm posterior hypodensity on image 119/2. Neither of these 2 hypodense areas was readily apparent on 05/12/2018. Several other vague hypodense areas are noted within the right lower lobe consolidation. Severe centrilobular emphysema. 0.8 by 0.5 cm left upper lobe nodule on image 101/7, stable. 0.7 by 0.5 cm left lower lobe nodule on image 106/7, stable. There is atelectasis medially in the left lower lobe. Upper Abdomen: Hypodense triangular regions in the spleen are probably from splenic infarcts particularly in light of the faint perisplenic edema, and less likely to be due to early vascular phase. Advanced atherosclerosis of the splenic artery noted. Advanced abdominal aortic atherosclerosis noted with extensive plaque  formation intruding on the lumen just below the level of the renal arteries. Stable nodularity of the left adrenal gland. Musculoskeletal: Thoracic spondylosis. IMPRESSION: 1. Continued dense consolidation in the right lower lobe with sparing of the superior segment. There are several new hypodense regions within the consolidated right lower lobe which could be from abscess, pulmonary infarct, or tumor. The consolidation could be from radiation pneumonitis, pneumonia, or less likely bronchopulmonary spread of tumor. There is no central truncation of the tracheobronchial tree. Borderline adenopathy in the chest. 2. 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 Origin uncertain but there is advanced atherosclerosis of the splenic artery and abdominal aorta. 3. Stable nodularity of the left adrenal  gland. 4.  Aortic Atherosclerosis (ICD10-I70.0).  Coronary atherosclerosis. 5. Emphysema (ICD10-J43.9). 6. There are 2 stable left lung pulmonary nodules which warrants surveillance. Electronically Signed   By: Van Clines M.D.   On: 06/14/2018 13:16        Scheduled Meds: . sodium chloride   Intravenous Once  . calcium carbonate  1 tablet Oral BID WC  . famotidine  20 mg Oral BID  . feeding supplement (ENSURE ENLIVE)  237 mL Oral TID BM  . Fluticasone-Umeclidin-Vilant  1 puff Inhalation Daily  . folic acid  1 mg Oral q morning - 10a  . HYDROcodone-homatropine  5 mL Oral BID  . iron polysaccharides  150 mg Oral Daily  . multivitamin with minerals  1 tablet Oral Daily  . pantoprazole  40 mg Oral BID  . potassium chloride  40 mEq Oral Q4H  . predniSONE  10 mg Oral Q breakfast  . sodium chloride flush  3 mL Intravenous Q12H   Continuous Infusions: . sodium chloride 75 mL/hr at 06/15/18 0600  . sodium chloride       LOS: 1 day    Time spent: 35 minutes    Irine Seal, MD [facility] Pager [phone]  If 7PM-7AM, please contact night-coverage [email or web address] Password [facility] 06/15/2018,  11:35 AM

## 2018-06-16 ENCOUNTER — Telehealth: Payer: Self-pay | Admitting: Medical Oncology

## 2018-06-16 LAB — BASIC METABOLIC PANEL
Anion gap: 4 — ABNORMAL LOW (ref 5–15)
BUN: 14 mg/dL (ref 8–23)
CALCIUM: 8.3 mg/dL — AB (ref 8.9–10.3)
CO2: 28 mmol/L (ref 22–32)
CREATININE: 0.47 mg/dL (ref 0.44–1.00)
Chloride: 106 mmol/L (ref 98–111)
Glucose, Bld: 84 mg/dL (ref 70–99)
Potassium: 4.9 mmol/L (ref 3.5–5.1)
Sodium: 138 mmol/L (ref 135–145)

## 2018-06-16 LAB — CBC
HCT: 28.3 % — ABNORMAL LOW (ref 36.0–46.0)
Hemoglobin: 9 g/dL — ABNORMAL LOW (ref 12.0–15.0)
MCH: 29.3 pg (ref 26.0–34.0)
MCHC: 31.8 g/dL (ref 30.0–36.0)
MCV: 92.2 fL (ref 78.0–100.0)
PLATELETS: 294 10*3/uL (ref 150–400)
RBC: 3.07 MIL/uL — ABNORMAL LOW (ref 3.87–5.11)
RDW: 17.2 % — AB (ref 11.5–15.5)
WBC: 6.5 10*3/uL (ref 4.0–10.5)

## 2018-06-16 LAB — PROCALCITONIN

## 2018-06-16 MED ORDER — LEVALBUTEROL HCL 0.63 MG/3ML IN NEBU
0.6300 mg | INHALATION_SOLUTION | Freq: Four times a day (QID) | RESPIRATORY_TRACT | Status: DC
  Administered 2018-06-16 – 2018-06-18 (×6): 0.63 mg via RESPIRATORY_TRACT
  Filled 2018-06-16 (×6): qty 3

## 2018-06-16 MED ORDER — IPRATROPIUM BROMIDE 0.02 % IN SOLN
0.5000 mg | Freq: Four times a day (QID) | RESPIRATORY_TRACT | Status: DC
  Administered 2018-06-16 – 2018-06-18 (×6): 0.5 mg via RESPIRATORY_TRACT
  Filled 2018-06-16 (×6): qty 2.5

## 2018-06-16 MED ORDER — SODIUM CHLORIDE 0.9 % IV BOLUS
500.0000 mL | Freq: Once | INTRAVENOUS | Status: AC
  Administered 2018-06-16: 500 mL via INTRAVENOUS

## 2018-06-16 NOTE — Progress Notes (Signed)
Report given to [place]. Patient transferred via bed with O2. Donne Hazel, RN

## 2018-06-16 NOTE — Progress Notes (Signed)
PT Cancellation Note  Patient Details Name: Casey Wilkinson MRN: [id number] DOB: 07/20/1941   Cancelled Treatment:    Reason Eval/Treat Not Completed: Patient at procedure or test/unavailable. Patient is getting a breathing treatment.will check back another time.   Claretha Cooper 06/16/2018, 4:19 PM Casey Wilkinson PT [phone]

## 2018-06-16 NOTE — Progress Notes (Signed)
PROGRESS NOTE    Casey Wilkinson  TDD:[id number] DOB: 10-10-1941 DOA: 06/14/2018 PCP: Shon Baton, MD    Brief Narrative:  Casey Wilkinson is a 77 y.o. 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  She underwent a thoracentesis on 06/10/18 and 50 cc where removed. She returns for a pulse ox of 84% at the SNF. She was given Neb treatments. Her family states that the SNF has steadily been going up on her O2 from 2 liters to about 5-6 L lately and her shortness of breath is not acute but has slowly been worsening for weeks. She can barely ambulate short distances anymore. He has a mild cough with is mostly productive of blood and this is unchanged for weeks now. This AM her cough was productive of clear sputum. She has not had any chest pain. She has not had any fevers or chills. She had pedal edema when she was getting radiation but this issues has resolved. No h/o CHF.   ED Course: Prednisone and Albuterol neb given, Vanc and Cefepime ordered    Assessment & Plan:   Principal Problem:   Acute on chronic respiratory failure with hypoxia (HCC) Active Problems:   COPD (chronic obstructive pulmonary disease) (HCC)   LUPUS ERYTHEMATOSUS, DISCOID   Adenocarcinoma of right lung (HCC)   Anemia   Hypoxia  #1 acute on chronic hypoxic respiratory failure in the setting of acute COPD exacerbation superimposed on underlying adenocarcinoma of the lung with extensive right lower lobe consolidation Patient presented worsening shortness of breath felt to be secondary to an acute COPD exacerbation and likely radiation pneumonitis patient status post 10 cycles, probable worsening cancer. Patient with clinical improvement.  Patient status post 1 dose of antibiotics however not on any current antibiotics.  Continue current  dose of steroids, scheduled bronchodilators.  Hypertonic saline, flutter valve and Mucinex, chest PT added to current regimen per pulmonary.  Pulmonary following and appreciate input and recommendations.  2.  Anemia Patient noted to be anemic and on admission had a hemoglobin of 7.9.  Status post 1 unit packed red blood cells 06/14/2018 hemoglobin currently at 9.0.  Follow H&H.  3.  Hypokalemia Repleted.  4.  Adenocarcinoma of the lung stage IIIa/IV/with extensive right lower lobe consolidation Patient has received radiation treatment.  Patient states was supposed to get a PET scan as recommended by her oncologist, Dr. Lorna Few and patient has been rescheduled for her PET scan.  Outpatient follow-up with oncology.  5.  Dehydration Status post 1 unit packed red blood cells.  Hemoglobin stable at 9.0.  Patient hydrated.  Follow.   6.  Indigestion Continue Pepcid, PPI, Tums.   DVT prophylaxis: SCDs Code Status: DNR Family Communication: Updated patient.  No family at bedside. Disposition Plan: Likely back to skilled nursing facility when medically stable.   Consultants:   Pccm: Dr Ander Slade 06/14/2018  Procedures:   CT chest 06/14/2018  Chest x-ray 06/14/2018  Antimicrobials:   None   Subjective: Patient sitting up in bed family at bedside.  Still has shortness of breath has improved since admission.  Denies any chest pain.  Patient still with some hemoptysis which was ongoing prior to admission.  Objective: Vitals:   06/15/18 2127 06/15/18 2221 06/16/18 0529 06/16/18 0835  BP: 130/61  137/63   Pulse: Marland Kitchen)  113 (!) 105 (!) 110   Resp: 18  18   Temp: 98.2 F (36.8 C)  98.2 F (36.8 C)   TempSrc: Oral  Oral   SpO2: 95%  97% 94%  Weight:      Height:        Intake/Output Summary (Last 24 hours) at 06/16/2018 1050 Last data filed at 06/16/2018 1003 Gross per 24 hour  Intake 1848 ml  Output 1350 ml  Net 498 ml   Filed Weights   06/14/18 1129 06/14/18 1952    Weight: 68.5 kg 70.7 kg    Examination:  General exam: NAD Respiratory system: Minimal expiratory wheezing.  No rhonchi.  No crackles.  Speaking in full sentences.  No use of accessory muscles of respiration.    Cardiovascular system: Regular rate and rhythm no murmurs rubs or gallops.  No lower extremity edema.  No JVD.  Gastrointestinal system: Abdomen is soft, nontender, nondistended, positive bowel sounds.  No rebound.  Central nervous system: Alert and oriented. No focal neurological deficits. Extremities: Symmetric 5 x 5 power. Skin: No rashes, lesions or ulcers Psychiatry: Judgement and insight appear normal. Mood & affect appropriate.     Data Reviewed: I have personally reviewed following labs and imaging studies  CBC: Recent Labs  Lab 06/14/18 0948 06/14/18 2128 06/15/18 1022 06/16/18 0507  WBC 6.3  --  6.4 6.5  NEUTROABS 5.8  --  5.6  --   HGB 7.9* 8.8* 9.3* 9.0*  HCT 24.8* 27.1* 29.2* 28.3*  MCV 90.5  --  92.1 92.2  PLT 323  --  287 409   Basic Metabolic Panel: Recent Labs  Lab 06/14/18 0948 06/15/18 1022 06/16/18 0507  NA 136 141 138  K 4.2 3.2* 4.9  CL 98 105 106  CO2 27 26 28   GLUCOSE 100* 111* 84  BUN 24* 17 14  CREATININE 0.81 0.62 0.47  CALCIUM 8.7* 8.4* 8.3*   GFR: Estimated Creatinine Clearance: 57.2 mL/min (by C-G formula based on SCr of 0.47 mg/dL). Liver Function Tests: Recent Labs  Lab 06/14/18 0948  AST 24  ALT 18  ALKPHOS 77  BILITOT 0.8  PROT 6.4*  ALBUMIN 2.3*   No results for input(s): LIPASE, AMYLASE in the last 168 hours. No results for input(s): AMMONIA in the last 168 hours. Coagulation Profile: No results for input(s): INR, PROTIME in the last 168 hours. Cardiac Enzymes: Recent Labs  Lab 06/14/18 0948  TROPONINI 0.05*   BNP (last 3 results) No results for input(s): PROBNP in the last 8760 hours. HbA1C: No results for input(s): HGBA1C in the last 72 hours. CBG: No results for input(s): GLUCAP in the last 168  hours. Lipid Profile: No results for input(s): CHOL, HDL, LDLCALC, TRIG, CHOLHDL, LDLDIRECT in the last 72 hours. Thyroid Function Tests: No results for input(s): TSH, T4TOTAL, FREET4, T3FREE, THYROIDAB in the last 72 hours. Anemia Panel: No results for input(s): VITAMINB12, FOLATE, FERRITIN, TIBC, IRON, RETICCTPCT in the last 72 hours. Sepsis Labs: Recent Labs  Lab 06/14/18 0954 06/15/18 1321 06/16/18 0507  PROCALCITON  --  <0.10 <0.10  LATICACIDVEN 0.98  --   --     Recent Results (from the past 240 hour(s))  Blood culture (routine x 2)     Status: None (Preliminary result)   Collection Time: 06/14/18  9:48 AM  Result Value Ref Range Status   Specimen Description   Final    BLOOD RIGHT ARM Performed at [facility] [address]  [place], [place] [address]    Special Requests   Final    BOTTLES DRAWN AEROBIC AND ANAEROBIC Blood Culture adequate volume   Culture   Final    NO GROWTH < 24 HOURS Performed at [facility], [place] [address] [place], [place] [address]    Report Status PENDING  Incomplete  Blood culture (routine x 2)     Status: None (Preliminary result)   Collection Time: 06/14/18 10:02 AM  Result Value Ref Range Status   Specimen Description   Final    BLOOD LEFT ARM Performed at [facility] [address], [place], [place] [address]    Special Requests   Final    BOTTLES DRAWN AEROBIC AND ANAEROBIC Blood Culture adequate volume   Culture   Final    NO GROWTH < 24 HOURS Performed at [facility], [place] [address] [place], [place] [address]    Report Status PENDING  Incomplete         Radiology Studies: Ct Chest W Contrast  Result Date: 06/14/2018 CLINICAL DATA:  Productive cough. Hemoptysis and wheezing. Progressive right lower lobe opacity on chest radiography. EXAM: CT CHEST WITH CONTRAST TECHNIQUE: Multidetector CT imaging of the chest was performed during intravenous contrast administration.  CONTRAST:  30mL OMNIPAQUE IOHEXOL 300 MG/ML  SOLN COMPARISON:  05/12/2017 FINDINGS: Cardiovascular: Coronary, aortic arch, and branch vessel atherosclerotic vascular disease. Mediastinum/Nodes: Scattered small mediastinal lymph nodes including an 8 mm paratracheal node on image 67/2 which is stable, and a 9 mm right hilar lymph node. Currently no overtly pathologic adenopathy. Lungs/Pleura: There is consolidation of much of the right lung with some sparing of the superior segment. Air bronchograms within the consolidated portion. No central obstructing bronchial lesion. However, within the consolidated portion, there several hypodense areas including a 2.0 by 2.0 peripheral hypodensity on image 117/2 and a 2.9 by 1.4 cm posterior hypodensity on image 119/2. Neither of these 2 hypodense areas was readily apparent on 05/12/2018. Several other vague hypodense areas are noted within the right lower lobe consolidation. Severe centrilobular emphysema. 0.8 by 0.5 cm left upper lobe nodule on image 101/7, stable. 0.7 by 0.5 cm left lower lobe nodule on image 106/7, stable. There is atelectasis medially in the left lower lobe. Upper Abdomen: Hypodense triangular regions in the spleen are probably from splenic infarcts particularly in light of the faint perisplenic edema, and less likely to be due to early vascular phase. Advanced atherosclerosis of the splenic artery noted. Advanced abdominal aortic atherosclerosis noted with extensive plaque formation intruding on the lumen just below the level of the renal arteries. Stable nodularity of the left adrenal gland. Musculoskeletal: Thoracic spondylosis. IMPRESSION: 1. Continued dense consolidation in the right lower lobe with sparing of the superior segment. There are several new hypodense regions within the consolidated right lower lobe which could be from abscess, pulmonary infarct, or tumor. The consolidation could be from radiation pneumonitis, pneumonia, or less likely  bronchopulmonary spread of tumor. There is no central truncation of the tracheobronchial tree. Borderline adenopathy in the chest. 2. 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 Origin uncertain but there is advanced atherosclerosis of the splenic artery and abdominal aorta. 3. Stable nodularity of the left adrenal gland. 4.  Aortic Atherosclerosis (ICD10-I70.0).  Coronary atherosclerosis. 5. Emphysema (ICD10-J43.9). 6. There are 2 stable left lung pulmonary nodules which warrants surveillance. Electronically Signed   By: Van Clines M.D.   On: 06/14/2018 13:16        Scheduled Meds: .  sodium chloride   Intravenous Once  . budesonide (PULMICORT) nebulizer solution  0.5 mg Nebulization BID  . calcium carbonate  1 tablet Oral BID WC  . famotidine  20 mg Oral BID  . feeding supplement (ENSURE ENLIVE)  237 mL Oral TID BM  . folic acid  1 mg Oral q morning - 10a  . HYDROcodone-homatropine  5 mL Oral BID  . ipratropium-albuterol  3 mL Nebulization QID  . iron polysaccharides  150 mg Oral Daily  . multivitamin with minerals  1 tablet Oral Daily  . pantoprazole  40 mg Oral BID  . predniSONE  10 mg Oral Q breakfast  . sodium chloride flush  3 mL Intravenous Q12H  . sodium chloride HYPERTONIC  4 mL Nebulization BID   Continuous Infusions: . sodium chloride       LOS: 2 days    Time spent: 35 minutes    Irine Seal, MD [facility] Pager [phone]  If 7PM-7AM, please contact night-coverage [email or web address] Password [email or web address] 06/16/2018, 10:50 AM

## 2018-06-16 NOTE — Progress Notes (Signed)
Call made to [facility] to report to RN that will be receiving patient. Spoke with Caren Griffins who reports RN would like to call me back. My number given to [facility]. Donne Hazel, RN

## 2018-06-16 NOTE — Telephone Encounter (Signed)
Pt requested to cancel appt tomorrow and PET scan will be resheduled. PET scan date moved out to expected date of 8/30. Schedule request sent to see [facility] next week.

## 2018-06-17 ENCOUNTER — Inpatient Hospital Stay: Payer: Medicare Other | Admitting: Internal Medicine

## 2018-06-17 ENCOUNTER — Ambulatory Visit (HOSPITAL_COMMUNITY): Payer: Medicare Other

## 2018-06-17 ENCOUNTER — Inpatient Hospital Stay: Payer: Medicare Other

## 2018-06-17 ENCOUNTER — Inpatient Hospital Stay (HOSPITAL_COMMUNITY): Payer: Medicare Other

## 2018-06-17 DIAGNOSIS — D649 Anemia, unspecified: Secondary | ICD-10-CM

## 2018-06-17 DIAGNOSIS — C3491 Malignant neoplasm of unspecified part of right bronchus or lung: Secondary | ICD-10-CM

## 2018-06-17 DIAGNOSIS — Z7189 Other specified counseling: Secondary | ICD-10-CM

## 2018-06-17 DIAGNOSIS — J441 Chronic obstructive pulmonary disease with (acute) exacerbation: Secondary | ICD-10-CM

## 2018-06-17 DIAGNOSIS — Z515 Encounter for palliative care: Secondary | ICD-10-CM

## 2018-06-17 LAB — CBC
HCT: 29.8 % — ABNORMAL LOW (ref 36.0–46.0)
HEMOGLOBIN: 9.6 g/dL — AB (ref 12.0–15.0)
MCH: 29.2 pg (ref 26.0–34.0)
MCHC: 32.2 g/dL (ref 30.0–36.0)
MCV: 90.6 fL (ref 78.0–100.0)
Platelets: 279 10*3/uL (ref 150–400)
RBC: 3.29 MIL/uL — AB (ref 3.87–5.11)
RDW: 17.1 % — ABNORMAL HIGH (ref 11.5–15.5)
WBC: 7.5 10*3/uL (ref 4.0–10.5)

## 2018-06-17 LAB — BASIC METABOLIC PANEL
Anion gap: 6 (ref 5–15)
BUN: 12 mg/dL (ref 8–23)
CHLORIDE: 99 mmol/L (ref 98–111)
CO2: 32 mmol/L (ref 22–32)
Calcium: 8.5 mg/dL — ABNORMAL LOW (ref 8.9–10.3)
Creatinine, Ser: 0.58 mg/dL (ref 0.44–1.00)
GFR calc Af Amer: >60 mL/min (ref >60)
GFR calc non Af Amer: >60 mL/min (ref >60)
Glucose, Bld: 93 mg/dL (ref 70–99)
POTASSIUM: 4.1 mmol/L (ref 3.5–5.1)
Sodium: 137 mmol/L (ref 135–145)

## 2018-06-17 MED ORDER — FUROSEMIDE 10 MG/ML IJ SOLN
60.0000 mg | Freq: Once | INTRAMUSCULAR | Status: AC
  Administered 2018-06-17: 60 mg via INTRAVENOUS
  Filled 2018-06-17: qty 6

## 2018-06-17 MED ORDER — BOOST / RESOURCE BREEZE PO LIQD CUSTOM
1.0000 | Freq: Once | ORAL | Status: AC
  Administered 2018-06-17: 1 via ORAL

## 2018-06-17 MED ORDER — MIRTAZAPINE 15 MG PO TABS
7.5000 mg | ORAL_TABLET | Freq: Every day | ORAL | Status: DC
  Administered 2018-06-17 – 2018-06-18 (×2): 7.5 mg via ORAL
  Filled 2018-06-17 (×2): qty 1

## 2018-06-17 MED ORDER — METOPROLOL TARTRATE 25 MG PO TABS
25.0000 mg | ORAL_TABLET | Freq: Two times a day (BID) | ORAL | Status: DC
  Administered 2018-06-17 – 2018-06-19 (×4): 25 mg via ORAL
  Filled 2018-06-17 (×4): qty 1

## 2018-06-17 MED ORDER — METOPROLOL TARTRATE 5 MG/5ML IV SOLN
2.5000 mg | Freq: Four times a day (QID) | INTRAVENOUS | Status: DC | PRN
  Administered 2018-06-17: 2.5 mg via INTRAVENOUS
  Filled 2018-06-17: qty 5

## 2018-06-17 MED ORDER — BOOST PLUS PO LIQD
237.0000 mL | Freq: Once | ORAL | Status: DC
  Filled 2018-06-17: qty 237

## 2018-06-17 NOTE — Evaluation (Signed)
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    Clinical Impression   Pt admitted with the above. Pt currently with functional limitations due to the deficits listed below (see OT Problem List).  Pt will benefit from skilled OT to increase their safety and independence with ADL and functional mobility for ADL to facilitate discharge to venue listed below.      Follow Up Recommendations  SNF    Equipment Recommendations  None recommended by OT    Recommendations for Other Services       Precautions / Restrictions Precautions Precautions: Fall      Mobility Bed Mobility Overal bed mobility: Needs Assistance Bed Mobility: Supine to Sit     Supine to sit: Mod assist        Transfers Overall transfer level: Needs assistance Equipment used: Rolling walker (2 wheeled) Transfers: Sit to/from Stand Sit to Stand: Mod assist;+2 safety/equipment;+2 physical assistance              Balance Overall balance assessment: Needs assistance;History of Falls                                         ADL either performed or assessed with clinical judgement   ADL Overall ADL's : Needs assistance/impaired Eating/Feeding: Set up;Sitting   Grooming: Set up;Sitting   Upper Body Bathing: Sitting;Minimal assistance   Lower Body Bathing: +2 for physical assistance;+2 for safety/equipment;Maximal assistance;Sit to/from stand;Cueing for safety   Upper Body Dressing : Minimal assistance;Sitting   Lower Body Dressing: Maximal assistance;+2 for safety/equipment;+2 for physical assistance;Sit to/from  stand;Cueing for safety;Cueing for sequencing   Toilet Transfer: Moderate assistance;+2 for safety/equipment;+2 for physical assistance;Maximal assistance   Toileting- Clothing Manipulation and Hygiene: Total assistance;Sit to/from stand;Cueing for sequencing;Cueing for safety;+2 for safety/equipment;+2 for physical assistance               Vision Patient Visual Report: No change from baseline              Pertinent Vitals/Pain Pain Assessment: No/denies pain     Hand Dominance     Extremity/Trunk Assessment Upper Extremity Assessment Upper Extremity Assessment: Generalized weakness           Communication     Cognition Arousal/Alertness: Awake/alert Behavior During Therapy: WFL for tasks assessed/performed Overall Cognitive Status: Within Functional Limits for tasks assessed                                                Home Living Family/patient expects to be discharged to:: Skilled nursing facility                                                 OT Problem List: Decreased strength;Decreased activity tolerance;Impaired balance (sitting and/or standing);Decreased knowledge of precautions;Decreased knowledge of  use of DME or AE;Cardiopulmonary status limiting activity      OT Treatment/Interventions: Self-care/ADL training;Patient/family education;DME and/or AE instruction    OT Goals(Current goals can be found in the care plan section) Acute Rehab OT Goals Patient Stated Goal: back to SNF OT Goal Formulation: With patient Time For Goal Achievement: 07/01/18  OT Frequency: Min 2X/week   Barriers to D/C: Decreased caregiver support             AM-PAC PT "6 Clicks" Daily Activity     Outcome Measure Help from another person eating meals?: A Little Help from another person taking care of personal grooming?: A Little Help from another person toileting, which includes using toliet, bedpan, or urinal?: Total Help  from another person bathing (including washing, rinsing, drying)?: A Lot Help from another person to put on and taking off regular upper body clothing?: Total Help from another person to put on and taking off regular lower body clothing?: Total 6 Click Score: 11   End of Session Equipment Utilized During Treatment: Rolling walker Nurse Communication: Mobility status  Activity Tolerance: Patient tolerated treatment well Patient left: in chair;with call bell/phone within reach;with family/visitor present  OT Visit Diagnosis: Unsteadiness on feet (R26.81);Repeated falls (R29.6);History of falling (Z91.81);Other abnormalities of gait and mobility (R26.89);Muscle weakness (generalized) (M62.81)                Time: 7096-2836 OT Time Calculation (min): 44 min Charges:  OT General Charges $OT Visit: 1 Visit OT Evaluation $OT Eval Moderate Complexity: 1 Mod OT Treatments $Self Care/Home Management : 23-37 mins  Fisher, [place]  Payton Mccallum D 06/17/2018, 10:28 AM

## 2018-06-17 NOTE — Consult Note (Signed)
Consultation Note Date: 06/17/2018   Patient Name: Casey Wilkinson  DOB: Aug 19, 1941  MRN: [id number]  Age / Sex: 77 y.o., female  PCP: Shon Baton, MD Referring Physician: Aline August, MD  Reason for Consultation: Establishing goals of care  HPI/Patient Profile: 77 y.o. female  with past medical history of right lung stage IIIA/IV adenocarcinoma (s/p radiation, PET pending), colon cancer (in remission), sq cell cancer of leg, COPD on 2 L oxygen, HTN, discoid lupus admitted on 06/14/2018 with SOB and tachycardia with treatment of COPD exacerbation with underlying lung cancer.   Clinical Assessment and Goals of Care: I enjoyed my visit today with Casey Wilkinson and her husband at bedside. They have been happily married for >60 yrs and have 2 children with multiple grandchildren and great grandchildren. They have a great support system.   Casey Wilkinson was sleepy throughout my visit but was able to discuss Casey Wilkinson with [place]. They have good understanding of poor prognosis and options. Casey Wilkinson desires PET to be done so they know more about what they are dealing with. She says that they have already discussed somewhat about if she may or may not want treatment. I encouraged her to be her own advocate for what is best with her body. Her husband and family are supportive of any decisions that she makes.   They are able to talk very openly about death and dying and have a strong faith background. She has no fears regarding death and is ready when her time comes. Her husband says "I pray the Reita Cliche will just go on and take me when he does her." Encouraged him that his children and grandchildren might think differently! He is tearful and hates to see her sick and suffering. He wishes that he could take her home. We did discuss that there could be a role for hospice to help him get her home in the future is that is their desire.   They are  very aware of situation and seem to be keeping an open dialogue with their family. They would benefit from outpatient palliative to follow at [facility] and agree this would be helpful to them.   Primary Decision Maker PATIENT    SUMMARY OF RECOMMENDATIONS   - DNR confirmed by husband - They are accepting of poor prognosis and open to discussing options - They desire to continue work up and further discuss options but are also open to comfort care if this is what she decides   Code Status/Advance Care Planning:  DNR   Symptom Management:   Poor intake: Mirtazapine 7.5 added qhs. Ordered trials of lactose free Boost and Breeze supplements as Ensure made her vomit.   Denies pain and other discomforts/symptoms currently.   Palliative Prophylaxis:   Aspiration, Bowel Regimen, Delirium Protocol, Frequent Pain Assessment, Oral Care and Turn Reposition  Additional Recommendations (Limitations, Scope, Preferences):  Avoid Hospitalization  Psycho-social/Spiritual:   Desire for further Chaplaincy support:yes  Additional Recommendations: Education on Hospice and Grief/Bereavement Support  Prognosis:   Prognosis poor  given advanced cancer diagnosis. RN reports that there is a significant decline in functional status and overall deterioration since she cared for her ~1 month ago. Eligible for hospice < 6 months if she foregoes chemotherapy/treatment.   Discharge Planning: [facility] for rehab with Palliative care service follow-up      Primary Diagnoses: Present on Admission: . Acute on chronic respiratory failure with hypoxia ([place]) . LUPUS ERYTHEMATOSUS, DISCOID . COPD (chronic obstructive pulmonary disease) ([place]) . Adenocarcinoma of right lung ([place]) . Hypoxia   I have reviewed the medical record, interviewed the patient and family, and examined the patient. The following aspects are pertinent.  Past Medical History:  Diagnosis Date  . Anemia   .  Arthritis    "all over"  . Carotid artery stenosis    right side followed by [facility]- 65%  . Colitis April 2016  . Colon cancer ([place])   . COPD (chronic obstructive pulmonary disease) ([place])   . Discoid lupus erythematosus   . Diverticulitis April 2016   and Colitis  . History of blood transfusion 01/2015, 06/2015   colectomy  . Hypertension   . Psoriatic arthritis ([place])   . Shortness of breath dyspnea    with exertion  . Squamous cell carcinoma, leg    Social History   Socioeconomic History  . Marital status: Married    Spouse name: Not on file  . Number of children: 2  . Years of education: Not on file  . Highest education level: Not on file  Occupational History  . Occupation: retired    Comment: Secretary/receptionist x54yrs  Social Needs  . Financial resource strain: Not on file  . Food insecurity:    Worry: Not on file    Inability: Not on file  . Transportation needs:    Medical: Not on file    Non-medical: Not on file  Tobacco Use  . Smoking status: Former Smoker    Packs/day: 0.50    Years: 50.00    Pack years: 25.00    Types: Cigarettes    Last attempt to quit: 12/05/2000    Years since quitting: 17.5  . Smokeless tobacco: Never Used  . Tobacco comment: started smoking in early 20s  Substance and Sexual Activity  . Alcohol use: No    Alcohol/week: 0.0 standard drinks  . Drug use: No  . Sexual activity: Yes  Lifestyle  . Physical activity:    Days per week: Not on file    Minutes per session: Not on file  . Stress: Not on file  Relationships  . Social connections:    Talks on phone: Not on file    Gets together: Not on file    Attends religious service: Not on file    Active member of club or organization: Not on file    Attends meetings of clubs or organizations: Not on file    Relationship status: Not on file  Other Topics Concern  . Not on file  Social History Narrative  . Not on file   Family History  Problem Relation Age of Onset  . Heart  disease Father        Before age 42  . Hyperlipidemia Father   . Hypertension Father   . Pneumonia Mother   . Alzheimer's disease Mother   . Colon cancer Neg Hx    Scheduled Meds: . sodium chloride   Intravenous Once  . budesonide (PULMICORT) nebulizer solution  0.5 mg Nebulization BID  . calcium carbonate  1 tablet Oral BID WC  . famotidine  20 mg Oral BID  . feeding supplement (ENSURE ENLIVE)  237 mL Oral TID BM  . folic acid  1 mg Oral q morning - 10a  . HYDROcodone-homatropine  5 mL Oral BID  . ipratropium  0.5 mg Nebulization QID  . iron polysaccharides  150 mg Oral Daily  . levalbuterol  0.63 mg Nebulization QID  . metoprolol tartrate  25 mg Oral BID  . multivitamin with minerals  1 tablet Oral Daily  . pantoprazole  40 mg Oral BID  . sodium chloride flush  3 mL Intravenous Q12H  . sodium chloride HYPERTONIC  4 mL Nebulization BID   Continuous Infusions: . sodium chloride     PRN Meds:.sodium chloride, acetaminophen **OR** acetaminophen, guaiFENesin, iopamidol, metoprolol tartrate, ondansetron **OR** ondansetron (ZOFRAN) IV, sodium chloride flush, traMADol Allergies  Allergen Reactions  . Prochlorperazine Edisylate Nausea Only and Other (See Comments)    **COMPAZINE**   Stroke-like symptoms  . Sulfamethoxazole Nausea Only and Other (See Comments)    Pt taking Methotrexate, Sulfur drugs could cause SEVERE fatal reaction.  . Doxycycline Hives   Review of Systems  Constitutional: Positive for activity change, appetite change and fatigue.  Respiratory: Negative for shortness of breath.   Neurological: Positive for weakness.    Physical Exam  Constitutional: She is oriented to person, place, and time. She appears well-developed. She appears lethargic. She appears ill.  HENT:  Head: Normocephalic and atraumatic.  Cardiovascular: Tachycardia present.  Pulmonary/Chest:  Appears easily winded even at rest  Abdominal: Normal appearance.  Neurological: She is oriented to  person, place, and time. She appears lethargic.  Nursing note and vitals reviewed.   Vital Signs: BP 105/74 (BP Location: Right Arm)   Pulse (!) 131   Temp 99.5 F (37.5 C) (Oral)   Resp 14   Ht 5\' 5"  (1.651 m)   Wt 72.6 kg   SpO2 94%   BMI 26.63 kg/m  Pain Scale: 0-10   Pain Score: 0-No pain   SpO2: SpO2: 94 % O2 Device:SpO2: 94 % O2 Flow Rate: .O2 Flow Rate (L/min): 3 L/min  IO: Intake/output summary:   Intake/Output Summary (Last 24 hours) at 06/17/2018 1459 Last data filed at 06/17/2018 1436 Gross per 24 hour  Intake 0 ml  Output 1150 ml  Net -1150 ml    LBM: Last BM Date: 06/17/18 Baseline Weight: Weight: 68.5 kg Most recent weight: Weight: 72.6 kg     Palliative Assessment/Data: 30%     Time In: 1445 Time Out: 1600 Time Total: 75 min Greater than 50%  of this time was spent counseling and coordinating care related to the above assessment and plan.  Signed by: Vinie Sill, NP Palliative Medicine Team Pager # [phone] (M-F 8a-5p) Team Phone # [phone] (Nights/Weekends)

## 2018-06-17 NOTE — Progress Notes (Signed)
PT Cancellation Note  Patient Details Name: Casey Wilkinson MRN: [id number] DOB: 10-08-1941   Cancelled Treatment:    Reason Eval/Treat Not Completed: Medical issues which prohibited therapy , transferred to tele for increased HR.  Claretha Cooper 06/17/2018, 3:18 PM Tresa Endo PT [phone]

## 2018-06-17 NOTE — Progress Notes (Signed)
Patient ID: Casey Wilkinson, female   DOB: 01-22-1941, 77 y.o.   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  Patient was admitted with acute on chronic hypoxic respiratory failure with probable COPD flare superimposed on adenocarcinoma of the lung with extensive right lower lobe consolidation.  She was started on steroids and broad-spectrum antibiotics.  Pulmonary was consulted.  Antibiotics were discontinued.   Assessment & Plan:   Principal Problem:   Acute on chronic respiratory failure with hypoxia (H[facility]) Active Problems:   COPD (chronic obstructive pulmonary disease) ([facility])   LUPUS ERYTHEMATOSUS, DISCOID   Adenocarcinoma of right lung ([facility])   Anemia   Hypoxia   Acute on chronic hypoxic respiratory failure secondary to COPD exacerbation superimposed on underlying adenocarcinoma of the lung with extensive right lower lobe consolidation -Respiratory status is improving.  Currently on 3 L oxygen via nasal cannula -Pulmonary evaluation and follow-up has been appreciated: Antibiotics have been discontinued as per pulmonary recommendations.  Continue budesonide along with nebs.  Continue prednisone.  Tachycardia -Questionable cause.  Patient has been very tachycardic since last night.  Will get stat EKG.  Transfer to telemetry.  No chest pains.  Will give 1 dose of intravenous Lasix as patient has had positive fluid balance.  Adenocarcinoma of the right lung stage III 8/4 with extensive right lower lobe  consultation -patient has received radiation treatment.  Patient's PET scan has been rescheduled.  Outpatient follow-up with Dr. Julien Nordmann -Overall prognosis is guarded to poor.  Palliative care consult  Chronic anemia -Status post 1 unit packed red cell transfusion on 06/14/2018 -Currently hemoglobin stable.  Monitor next  Generalized deconditioning -Continue PT evaluation once the patient returns to nursing home   DVT prophylaxis: SCDs Code Status: DNR Family Communication: None at bedside Disposition Plan: Nursing home in 1 to 2 days if medically stable  Consultants: Pulmonary  Procedures: None  Antimicrobials: 1 dose of cefepime on 06/14/2018   Subjective: Patient seen and examined at bedside.  She denies any palpitations, chest pain, worsening shortness of breath.  No overnight fever or vomiting  Objective: Vitals:   06/17/18 0505 06/17/18 0739 06/17/18 0742 06/17/18 1024  BP: (!) 136/97     Pulse: (!) 122   (!) 145  Resp: 18     Temp: 98.8 F (37.1 C)     TempSrc: Oral     SpO2: 97% 97% 97%   Weight:      Height:        Intake/Output Summary (Last 24 hours) at 06/17/2018 1156 Last data filed at 06/17/2018 0503 Gross per 24 hour  Intake 0 ml  Output 850 ml  Net -850 ml   Filed Weights   06/14/18 1129 06/14/18 1952 06/16/18 2101  Weight: 68.5 kg 70.7 kg 72.6 kg    Examination:  General exam: Appears calm and comfortable.  No distress Respiratory system: Bilateral decreased breath sounds at bases with some scattered crackles Cardiovascular system: S1 & S2 heard, tachycardic  gastrointestinal system: Abdomen is nondistended, soft and nontender. Normal bowel sounds heard. Extremities: No cyanosis,  clubbing; trace edema    Data Reviewed: I have personally reviewed following labs and imaging studies  CBC: Recent Labs  Lab 06/14/18 0948 06/14/18 2128 06/15/18 1022 06/16/18 0507 06/17/18 0342  WBC 6.3  --  6.4 6.5 7.5  NEUTROABS 5.8  --  5.6  --   --     HGB 7.9* 8.8* 9.3* 9.0* 9.6*  HCT 24.8* 27.1* 29.2* 28.3* 29.8*  MCV 90.5  --  92.1 92.2 90.6  PLT 323  --  287 294 702   Basic Metabolic Panel: Recent Labs  Lab 06/14/18 0948 06/15/18 1022 06/16/18 0507 06/17/18 0342  NA 136 141 138 137  K 4.2 3.2* 4.9 4.1  CL 98 105 106 99  CO2 27 26 28  32  GLUCOSE 100* 111* 84 93  BUN 24* 17 14 12   CREATININE 0.81 0.62 0.47 0.58  CALCIUM 8.7* 8.4* 8.3* 8.5*   GFR: Estimated Creatinine Clearance: 57.8 mL/min (by C-G formula based on SCr of 0.58 mg/dL). Liver Function Tests: Recent Labs  Lab 06/14/18 0948  AST 24  ALT 18  ALKPHOS 77  BILITOT 0.8  PROT 6.4*  ALBUMIN 2.3*   No results for input(s): LIPASE, AMYLASE in the last 168 hours. No results for input(s): AMMONIA in the last 168 hours. Coagulation Profile: No results for input(s): INR, PROTIME in the last 168 hours. Cardiac Enzymes: Recent Labs  Lab 06/14/18 0948  TROPONINI 0.05*   BNP (last 3 results) No results for input(s): PROBNP in the last 8760 hours. HbA1C: No results for input(s): HGBA1C in the last 72 hours. CBG: No results for input(s): GLUCAP in the last 168 hours. Lipid Profile: No results for input(s): CHOL, HDL, LDLCALC, TRIG, CHOLHDL, LDLDIRECT in the last 72 hours. Thyroid Function Tests: No results for input(s): TSH, T4TOTAL, FREET4, T3FREE, THYROIDAB in the last 72 hours. Anemia Panel: No results for input(s): VITAMINB12, FOLATE, FERRITIN, TIBC, IRON, RETICCTPCT in the last 72 hours. Sepsis Labs: Recent Labs  Lab 06/14/18 0954 06/15/18 1321 06/16/18 0507  PROCALCITON  --  <0.10 <0.10  LATICACIDVEN 0.98  --   --     Recent Results (from the past 240 hour(s))  Blood culture (routine x 2)     Status: None (Preliminary result)   Collection Time: 06/14/18  9:48 AM  Result Value Ref Range Status   Specimen Description   Final    BLOOD RIGHT ARM Performed at [facility] [address], [place], [place] [address]     Special Requests   Final    BOTTLES DRAWN AEROBIC AND ANAEROBIC Blood Culture adequate volume   Culture   Final    NO GROWTH 3 DAYS Performed at [facility], [place] [address], [place], [place] [address]    Report Status PENDING  Incomplete  Blood culture (routine x 2)     Status: None (Preliminary result)   Collection Time: 06/14/18 10:02 AM  Result Value Ref Range Status   Specimen Description   Final    BLOOD LEFT ARM Performed at [facility] [address] [place], [place] [address]    Special Requests   Final    BOTTLES DRAWN AEROBIC AND ANAEROBIC Blood Culture adequate volume   Culture   Final    NO GROWTH 3 DAYS Performed at [facility], [place] [address], [place], [place] [address]    Report Status PENDING  Incomplete         Radiology Studies: No results  found.      Scheduled Meds: . sodium chloride   Intravenous Once  . budesonide (PULMICORT) nebulizer solution  0.5 mg Nebulization BID  . calcium carbonate  1 tablet Oral BID WC  . famotidine  20 mg Oral BID  . feeding supplement (ENSURE ENLIVE)  237 mL Oral TID BM  . folic acid  1 mg Oral q morning - 10a  . HYDROcodone-homatropine  5 mL Oral BID  . ipratropium  0.5 mg Nebulization QID  . iron polysaccharides  150 mg Oral Daily  . levalbuterol  0.63 mg Nebulization QID  . multivitamin with minerals  1 tablet Oral Daily  . pantoprazole  40 mg Oral BID  . sodium chloride flush  3 mL Intravenous Q12H  . sodium chloride HYPERTONIC  4 mL Nebulization BID   Continuous Infusions: . sodium chloride       LOS: 3 days        Aline August, MD [facility] Pager [phone]  If 7PM-7AM, please contact night-coverage [email or web address] Password [facility] 06/17/2018, 11:56 AM

## 2018-06-18 ENCOUNTER — Telehealth: Payer: Self-pay | Admitting: Internal Medicine

## 2018-06-18 DIAGNOSIS — R000 Tachycardia, unspecified: Secondary | ICD-10-CM

## 2018-06-18 DIAGNOSIS — J189 Pneumonia, unspecified organism: Principal | ICD-10-CM

## 2018-06-18 LAB — CBC WITH DIFFERENTIAL/PLATELET
Basophils Absolute: 0 10*3/uL (ref 0.0–0.1)
Basophils Relative: 0 %
EOS ABS: 0.1 10*3/uL (ref 0.0–0.7)
Eosinophils Relative: 1 %
HCT: 27.5 % — ABNORMAL LOW (ref 36.0–46.0)
HEMOGLOBIN: 8.9 g/dL — AB (ref 12.0–15.0)
LYMPHS ABS: 0.4 10*3/uL — AB (ref 0.7–4.0)
LYMPHS PCT: 7 %
MCH: 29.3 pg (ref 26.0–34.0)
MCHC: 32.4 g/dL (ref 30.0–36.0)
MCV: 90.5 fL (ref 78.0–100.0)
Monocytes Absolute: 0.5 10*3/uL (ref 0.1–1.0)
Monocytes Relative: 7 %
NEUTROS PCT: 85 %
Neutro Abs: 5.7 10*3/uL (ref 1.7–7.7)
Platelets: 272 10*3/uL (ref 150–400)
RBC: 3.04 MIL/uL — AB (ref 3.87–5.11)
RDW: 17.1 % — ABNORMAL HIGH (ref 11.5–15.5)
WBC: 6.7 10*3/uL (ref 4.0–10.5)

## 2018-06-18 LAB — BASIC METABOLIC PANEL
ANION GAP: 8 (ref 5–15)
BUN: 21 mg/dL (ref 8–23)
CHLORIDE: 96 mmol/L — AB (ref 98–111)
CO2: 32 mmol/L (ref 22–32)
Calcium: 8.3 mg/dL — ABNORMAL LOW (ref 8.9–10.3)
Creatinine, Ser: 0.56 mg/dL (ref 0.44–1.00)
GFR calc Af Amer: >60 mL/min (ref >60)
GFR calc non Af Amer: >60 mL/min (ref >60)
Glucose, Bld: 92 mg/dL (ref 70–99)
POTASSIUM: 3.5 mmol/L (ref 3.5–5.1)
Sodium: 136 mmol/L (ref 135–145)

## 2018-06-18 LAB — MAGNESIUM: MAGNESIUM: 1.9 mg/dL (ref 1.7–2.4)

## 2018-06-18 MED ORDER — HYDROCODONE-HOMATROPINE 5-1.5 MG/5ML PO SYRP: 5.0000 mL | ORAL_SOLUTION | Freq: Every evening | ORAL | 0 refills | Status: AC | PRN

## 2018-06-18 MED ORDER — MIRTAZAPINE 7.5 MG PO TABS: 7.5000 mg | ORAL_TABLET | Freq: Every day | ORAL | 0 refills | Status: AC

## 2018-06-18 MED ORDER — LEVALBUTEROL HCL 0.63 MG/3ML IN NEBU
0.6300 mg | INHALATION_SOLUTION | Freq: Three times a day (TID) | RESPIRATORY_TRACT | Status: DC
  Administered 2018-06-18 – 2018-06-19 (×2): 0.63 mg via RESPIRATORY_TRACT
  Filled 2018-06-18 (×3): qty 3

## 2018-06-18 MED ORDER — PREDNISONE 20 MG PO TABS: 20.0000 mg | ORAL_TABLET | Freq: Every day | ORAL | 0 refills | Status: AC

## 2018-06-18 MED ORDER — POTASSIUM CHLORIDE CRYS ER 20 MEQ PO TBCR
40.0000 meq | EXTENDED_RELEASE_TABLET | Freq: Once | ORAL | Status: AC
  Administered 2018-06-18: 40 meq via ORAL
  Filled 2018-06-18: qty 2

## 2018-06-18 MED ORDER — ENSURE ENLIVE PO LIQD: 237.0000 mL | Freq: Three times a day (TID) | ORAL | 12 refills | Status: AC

## 2018-06-18 MED ORDER — METOPROLOL TARTRATE 25 MG PO TABS: 25.0000 mg | ORAL_TABLET | Freq: Two times a day (BID) | ORAL | 0 refills | Status: AC

## 2018-06-18 MED ORDER — TRAMADOL HCL 50 MG PO TABS: 50.0000 mg | ORAL_TABLET | Freq: Four times a day (QID) | ORAL | 0 refills | Status: AC | PRN

## 2018-06-18 MED ORDER — IPRATROPIUM-ALBUTEROL 0.5-2.5 (3) MG/3ML IN SOLN: 3.0000 mL | RESPIRATORY_TRACT | 0 refills | Status: AC | PRN

## 2018-06-18 MED ORDER — IPRATROPIUM BROMIDE 0.02 % IN SOLN
0.5000 mg | Freq: Three times a day (TID) | RESPIRATORY_TRACT | Status: DC
  Administered 2018-06-18 – 2018-06-19 (×2): 0.5 mg via RESPIRATORY_TRACT
  Filled 2018-06-18 (×3): qty 2.5

## 2018-06-18 NOTE — Consult Note (Signed)
Cardiology Consultation:   Patient ID: [name]; [id number]; [birth year]   Admit date: 06/14/2018 Date of Consult: 06/18/2018  Primary Care Provider: Shon Baton, MD Primary Cardiologist: None  Primary Electrophysiologist:  None   Patient Profile:   Casey Wilkinson is a 77 y.o. female with a hx of metastatic lung cancer  who is being seen today for the evaluation of tachycardia and NSVT  at the request of [place].  History of Present Illness:   Casey Wilkinson 77 y.o. with stage 3A/4 lung cancer DNR , palliative care Rx radiation Also history of colon cancer COPD On home oxygen, Lupus Admitted with post obstructive pneumonia Rx antibiotics and steroids. Noted on telemetry to be more tachycardic. Notes indicate 7 beat run of NSVT but on my review I only see occasional PAC and PVCls  This are asymptomatic Patient;s only complaints are cough and dyspnea. She has no palpitations, chest pain or syncope. She had TTE in July with normal EF.  She has no cardiac enlargement on CXR only RLL pneumonia. She is anemic with Hct 27.5  Has received one dose of lasix for volume overload No recent thyroid on chart  Past Medical History:  Diagnosis Date  . Anemia   . Arthritis    "all over"  . Carotid artery stenosis    right side followed by [facility]- 65%  . Colitis April 2016  . Colon cancer ([place])   . COPD (chronic obstructive pulmonary disease) ([place])   . Discoid lupus erythematosus   . Diverticulitis April 2016   and Colitis  . History of blood transfusion 01/2015, 06/2015   colectomy  . Hypertension   . Psoriatic arthritis ([place])   . Shortness of breath dyspnea    with exertion  . Squamous cell carcinoma, leg     Past Surgical History:  Procedure Laterality Date  . APPENDECTOMY  06/2015  . CATARACT EXTRACTION W/ INTRAOCULAR LENS  IMPLANT, BILATERAL Bilateral 2017  . COLONOSCOPY  06/23/15  . COLONOSCOPY W/ POLYPECTOMY    . CYSTOCELE REPAIR  2006  . DILATION AND CURETTAGE OF UTERUS  X 2  .  EXCISIONAL HEMORRHOIDECTOMY  2006  . JOINT REPLACEMENT    . LAPAROSCOPIC PARTIAL COLECTOMY N/A 06/23/2015   Procedure: LAPAROSCOPIC ILEOCECETOMY;  Surgeon: Excell Seltzer, MD;  Location: [facility];  Service: General;  Laterality: N/A;  . MOHS SURGERY Left    "basal; LLE"  . RECTOCELE REPAIR  2006  . SQUAMOUS CELL CARCINOMA EXCISION  "several"   "legs mostly"  . TOTAL KNEE ARTHROPLASTY Left 01/31/2016   Procedure: LEFT TOTAL KNEE ARTHROPLASTY;  Surgeon: Leandrew Koyanagi, MD;  Location: [facility];  Service: Orthopedics;  Laterality: Left;  Marland Kitchen VAGINAL HYSTERECTOMY  1968   "total"  . VIDEO BRONCHOSCOPY Bilateral 05/06/2018   Procedure: VIDEO BRONCHOSCOPY WITH FLUORO;  Surgeon: Collene Gobble, MD;  [facility];  Service: Cardiopulmonary;  Laterality: Bilateral;     Home Medications:  Prior to Admission medications   Medication Sig Start Date End Date Taking? Authorizing Provider  acetaminophen (TYLENOL) 325 MG tablet Take 1,300 mg by mouth every 8 (eight) hours as needed for mild pain.   Yes [provider]  albuterol (PROAIR HFA) 108 (90 Base) MCG/ACT inhaler Inhale 2 puffs into the lungs every 4 (four) hours as needed for wheezing or shortness of breath. 03/31/18  Yes Collene Gobble, MD  atorvastatin (LIPITOR) 10 MG tablet Take 10 mg by mouth daily.   Yes [provider]  Calcium  Carbonate-Vitamin D 600-400 MG-UNIT per tablet Take 1 tablet by mouth every morning.    Yes [provider]  Cyanocobalamin (B-12) 2500 MCG TABS Take 2,500 mcg by mouth daily.   Yes [provider]  docusate sodium (COLACE) 100 MG capsule Take 100 mg by mouth at bedtime.    Yes [provider]  famotidine (PEPCID) 20 MG tablet Take 20 mg by mouth 2 (two) times daily.   Yes [provider]  folic acid (FOLVITE) 1 MG tablet Take 1 mg by mouth every morning.    Yes [provider]  guaiFENesin (MUCINEX) 600 MG 12 hr tablet Take 600 mg by mouth 2 (two) times  daily as needed for cough.   Yes [provider]  HYDROcodone-homatropine (HYCODAN) 5-1.5 MG/5ML syrup Take 5 mLs by mouth at bedtime and may repeat dose one time if needed. 05/22/18  Yes Dessa Phi, DO  iron polysaccharides (NIFEREX) 150 MG capsule Take 150 mg by mouth daily.   Yes [provider]  levofloxacin (LEVAQUIN) 500 MG tablet Take 500 mg by mouth daily.   Yes [provider]  lidocaine (XYLOCAINE) 2 % solution caregiver: Mix 1part 2% viscous lidocaine, 1part H20. Swallow 27mL of diluted mixture, 52min before meals and at bedtime, up to QID 06/03/18  Yes Eppie Gibson, MD  loratadine (CLARITIN) 10 MG tablet Take 10 mg by mouth daily.   Yes [provider]  Multiple Vitamin (MULTIVITAMIN WITH MINERALS) TABS tablet Take 1 tablet by mouth daily.   Yes [provider]  polyethylene glycol (MIRALAX / GLYCOLAX) packet Take 17 g by mouth daily as needed for mild constipation.    Yes [provider]  Potassium 99 MG TABS Take 99 mg by mouth daily.   Yes [provider]  traMADol (ULTRAM) 50 MG tablet Take 50 mg by mouth every 6 (six) hours as needed for moderate pain.   Yes [provider]  TRELEGY ELLIPTA 100-62.5-25 MCG/INH AEPB USE 1 INHALATION DAILY Patient taking differently: Inhale 1 puff into the lungs daily.  02/26/18  Yes Collene Gobble, MD  valsartan-hydrochlorothiazide (DIOVAN-HCT) 160-12.5 MG per tablet Take 1 tablet by mouth every morning.  02/11/15  Yes [provider]  predniSONE (DELTASONE) 10 MG tablet Take 4 tabs for 3 days, then 3 tabs for 3 days, then 2 tabs for 3 days, then 1 tab for 3 days, then 1/2 tab for 4 days. Patient not taking: Reported on 06/14/2018 05/22/18   Dessa Phi, DO    Inpatient Medications: Scheduled Meds: . sodium chloride   Intravenous Once  . budesonide (PULMICORT) nebulizer solution  0.5 mg Nebulization BID  . calcium carbonate  1 tablet Oral BID WC  . famotidine  20 mg  Oral BID  . feeding supplement (ENSURE ENLIVE)  237 mL Oral TID BM  . folic acid  1 mg Oral q morning - 10a  . HYDROcodone-homatropine  5 mL Oral BID  . ipratropium  0.5 mg Nebulization QID  . iron polysaccharides  150 mg Oral Daily  . lactose free nutrition  237 mL Oral Once  . levalbuterol  0.63 mg Nebulization QID  . metoprolol tartrate  25 mg Oral BID  . mirtazapine  7.5 mg Oral QHS  . multivitamin with minerals  1 tablet Oral Daily  . pantoprazole  40 mg Oral BID  . sodium chloride flush  3 mL Intravenous Q12H   Continuous Infusions: . sodium chloride     PRN Meds: sodium  chloride, acetaminophen **OR** acetaminophen, guaiFENesin, iopamidol, metoprolol tartrate, ondansetron **OR** ondansetron (ZOFRAN) IV, sodium chloride flush, traMADol  Allergies:    Allergies  Allergen Reactions  . Prochlorperazine Edisylate Nausea Only and Other (See Comments)    **COMPAZINE**   Stroke-like symptoms  . Sulfamethoxazole Nausea Only and Other (See Comments)    Pt taking Methotrexate, Sulfur drugs could cause SEVERE fatal reaction.  . Doxycycline Hives    Social History:   Social History   Socioeconomic History  . Marital status: Married    Spouse name: Not on file  . Number of children: 2  . Years of education: Not on file  . Highest education level: Not on file  Occupational History  . Occupation: retired    Comment: Secretary/receptionist x68yrs  Social Needs  . Financial resource strain: Not on file  . Food insecurity:    Worry: Not on file    Inability: Not on file  . Transportation needs:    Medical: Not on file    Non-medical: Not on file  Tobacco Use  . Smoking status: Former Smoker    Packs/day: 0.50    Years: 50.00    Pack years: 25.00    Types: Cigarettes    Last attempt to quit: 12/05/2000    Years since quitting: 17.5  . Smokeless tobacco: Never Used  . Tobacco comment: started smoking in early 20s  Substance and Sexual Activity  . Alcohol use: No     Alcohol/week: 0.0 standard drinks  . Drug use: No  . Sexual activity: Yes  Lifestyle  . Physical activity:    Days per week: Not on file    Minutes per session: Not on file  . Stress: Not on file  Relationships  . Social connections:    Talks on phone: Not on file    Gets together: Not on file    Attends religious service: Not on file    Active member of club or organization: Not on file    Attends meetings of clubs or organizations: Not on file    Relationship status: Not on file  . Intimate partner violence:    Fear of current or ex partner: Not on file    Emotionally abused: Not on file    Physically abused: Not on file    Forced sexual activity: Not on file  Other Topics Concern  . Not on file  Social History Narrative  . Not on file    Family History:    Family History  Problem Relation Age of Onset  . Heart disease Father        Before age 94  . Hyperlipidemia Father   . Hypertension Father   . Pneumonia Mother   . Alzheimer's disease Mother   . Colon cancer Neg Hx      ROS:  Please see the history of present illness.   All other ROS reviewed and negative.     Physical Exam/Data:   Vitals:   06/18/18 0445 06/18/18 0500 06/18/18 0830 06/18/18 1013  BP: (!) 116/54     Pulse: (!) 109   (!) 114  Resp: 18     Temp: 98.2 F (36.8 C)     TempSrc: Oral     SpO2: 91%  95%   Weight:  70 kg    Height:        Intake/Output Summary (Last 24 hours) at 06/18/2018 1020 Last data filed at 06/18/2018 0900 Gross per 24 hour  Intake 1060 ml  Output 1100 ml  Net -40 ml   Filed Weights   06/14/18 1952 06/16/18 2101 06/18/18 0500  Weight: 70.7 kg 72.6 kg 70 kg   Body mass index is 25.68 kg/m.  General:  Chronically ill white female  HEENT: normal Lymph: no adenopathy Neck: no JVD Endocrine:  No thryomegaly Vascular: No carotid bruits; FA pulses 2+ bilaterally without bruits  Cardiac:  normal S1, S2; RRR; no murmur   Lungs:  Diffuse wheezing and rhonchi    Abd: soft, nontender, no hepatomegaly  Ext: no edema Musculoskeletal:  No deformities, BUE and BLE strength normal and equal Skin: warm and dry  Neuro:  CNs 2-12 intact, no focal abnormalities noted Psych:  Normal affect   EKG:  The EKG was personally reviewed and demonstrates:   ST rate 114 nonspecific ST changes   Telemetry:  Telemetry was personally reviewed and demonstrates:  Mostly ST occasional PAC;s and PVCls   Relevant CV Studies: Echo 05/18/18 Study Conclusions  - Left ventricle: The cavity size was normal. There was moderate   concentric hypertrophy. Systolic function was normal. The   estimated ejection fraction was in the range of 60% to 65%. Wall   motion was normal; there were no regional wall motion   abnormalities. There was an increased relative contribution of   atrial contraction to ventricular filling. Doppler parameters are   consistent with abnormal left ventricular relaxation (grade 1   diastolic dysfunction). Doppler parameters are consistent with   high ventricular filling pressure. - Left atrium: The atrium was mildly dilated. - Atrial septum: There was increased thickness of the septum,   consistent with lipomatous hypertrophy. - Pulmonary arteries: Systolic pressure could not be accurately   estimated. - Pericardium, extracardiac: A trivial, free-flowing pericardial   effusion was identified along the right ventricular free wall.   The fluid had no internal echoes.  Laboratory Data:  Chemistry Recent Labs  Lab 06/16/18 0507 06/17/18 0342 06/18/18 0530  NA 138 137 136  K 4.9 4.1 3.5  CL 106 99 96*  CO2 28 32 32  GLUCOSE 84 93 92  BUN 14 12 21   CREATININE 0.47 0.58 0.56  CALCIUM 8.3* 8.5* 8.3*  GFRNONAA >60 >60 >60  GFRAA >60 >60 >60  ANIONGAP 4* 6 8    Recent Labs  Lab 06/14/18 0948  PROT 6.4*  ALBUMIN 2.3*  AST 24  ALT 18  ALKPHOS 77  BILITOT 0.8   Hematology Recent Labs  Lab 06/16/18 0507 06/17/18 0342 06/18/18 0530   WBC 6.5 7.5 6.7  RBC 3.07* 3.29* 3.04*  HGB 9.0* 9.6* 8.9*  HCT 28.3* 29.8* 27.5*  MCV 92.2 90.6 90.5  MCH 29.3 29.2 29.3  MCHC 31.8 32.2 32.4  RDW 17.2* 17.1* 17.1*  PLT 294 279 272   Cardiac Enzymes Recent Labs  Lab 06/14/18 0948  TROPONINI 0.05*   No results for input(s): TROPIPOC in the last 168 hours.  BNP Recent Labs  Lab 06/14/18 0948 06/15/18 1022  BNP 117.4* 336.3*    DDimer No results for input(s): DDIMER in the last 168 hours.  Radiology/Studies:  Dg Chest 2 View  Result Date: 06/17/2018 CLINICAL DATA:  Shortness of breath.  History of colon cancer, COPD. EXAM: CHEST - 2 VIEW COMPARISON:  Chest radiograph June 14, 2018 and CT chest June 14, 2018 FINDINGS: Persistent consolidation RIGHT lower lobe. Chronic interstitial changes and increased lung volumes with flattened hemidiaphragms. Small RIGHT pleural effusion. Small nodular densities unchanged. No pneumothorax. Cardiac silhouette is  mildly enlarged. Calcified aortic arch. Soft tissue planes and included osseous structures are unchanged. IMPRESSION: 1. Stable appearance of RIGHT lower lobe consolidation/pneumonia. 2. Mild cardiomegaly. 3.  Aortic Atherosclerosis (ICD10-I70.0). 4.  Emphysema (ICD10-J43.9). Electronically Signed   By: Elon Alas M.D.   On: 06/17/2018 13:44   Ct Chest W Contrast  Result Date: 06/14/2018 CLINICAL DATA:  Productive cough. Hemoptysis and wheezing. Progressive right lower lobe opacity on chest radiography. EXAM: CT CHEST WITH CONTRAST TECHNIQUE: Multidetector CT imaging of the chest was performed during intravenous contrast administration. CONTRAST:  76mL OMNIPAQUE IOHEXOL 300 MG/ML  SOLN COMPARISON:  05/12/2017 FINDINGS: Cardiovascular: Coronary, aortic arch, and branch vessel atherosclerotic vascular disease. Mediastinum/Nodes: Scattered small mediastinal lymph nodes including an 8 mm paratracheal node on image 67/2 which is stable, and a 9 mm right hilar lymph node. Currently no  overtly pathologic adenopathy. Lungs/Pleura: There is consolidation of much of the right lung with some sparing of the superior segment. Air bronchograms within the consolidated portion. No central obstructing bronchial lesion. However, within the consolidated portion, there several hypodense areas including a 2.0 by 2.0 peripheral hypodensity on image 117/2 and a 2.9 by 1.4 cm posterior hypodensity on image 119/2. Neither of these 2 hypodense areas was readily apparent on 05/12/2018. Several other vague hypodense areas are noted within the right lower lobe consolidation. Severe centrilobular emphysema. 0.8 by 0.5 cm left upper lobe nodule on image 101/7, stable. 0.7 by 0.5 cm left lower lobe nodule on image 106/7, stable. There is atelectasis medially in the left lower lobe. Upper Abdomen: Hypodense triangular regions in the spleen are probably from splenic infarcts particularly in light of the faint perisplenic edema, and less likely to be due to early vascular phase. Advanced atherosclerosis of the splenic artery noted. Advanced abdominal aortic atherosclerosis noted with extensive plaque formation intruding on the lumen just below the level of the renal arteries. Stable nodularity of the left adrenal gland. Musculoskeletal: Thoracic spondylosis. IMPRESSION: 1. Continued dense consolidation in the right lower lobe with sparing of the superior segment. There are several new hypodense regions within the consolidated right lower lobe which could be from abscess, pulmonary infarct, or tumor. The consolidation could be from radiation pneumonitis, pneumonia, or less likely bronchopulmonary spread of tumor. There is no central truncation of the tracheobronchial tree. Borderline adenopathy in the chest. 2. 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 Origin uncertain but there is advanced atherosclerosis of the splenic artery and abdominal aorta. 3. Stable nodularity of the left adrenal gland. 4.  Aortic Atherosclerosis  (ICD10-I70.0).  Coronary atherosclerosis. 5. Emphysema (ICD10-J43.9). 6. There are 2 stable left lung pulmonary nodules which warrants surveillance. Electronically Signed   By: Van Clines M.D.   On: 06/14/2018 13:16    Assessment and Plan:   1. Tachycardia/NSVT:  Asymptomatic TTE July with normal EF and CXR no great change in cardiac size to suggest New pericardial effusion High HR driven by anemia, pneumonia, COPD and hypermetabolic state of cancer. Check TSH No indication for further cardiac w/u in DNR palliative care patient would not use beta blocker with wheezing  Ok to take off telemetry   Will sign off No change in meds No cardiology f/u needed   For questions or updates, please contact [place] Please consult [email or web address] for contact info under Cardiology/STEMI.   Signed, Jenkins Rouge, MD  06/18/2018 10:20 AM

## 2018-06-18 NOTE — Telephone Encounter (Signed)
Called central radiology per 8/27 sch message to try to set up appts for PET scan - pt still in hospital unable to schedule f/u at the moment

## 2018-06-18 NOTE — Clinical Social Work Placement (Signed)
Patient returning to [facility]. Facility aware of patient's discharge and confirmed patient's ability to return. PTAR contacted, patient's family aware. Patient's RN can call report to [phone], packet complete. CSW signing off, no other needs identified at this time.  CLINICAL SOCIAL WORK PLACEMENT  NOTE  Date:  06/18/2018  Patient Details  Name: Casey Wilkinson MRN: [id number] Date of Birth: 03/13/1941  Clinical Social Work is seeking post-discharge placement for this patient at the [place] level of care (*CSW will initial, date and re-position this form in  chart as items are completed):  Yes   Patient/family provided with [place] Work Department's list of facilities offering this level of care within the geographic area requested by the patient (or if unable, by the patient's family).  Yes   Patient/family informed of their freedom to choose among providers that offer the needed level of care, that participate in Medicare, Medicaid or managed care program needed by the patient, have an available bed and are willing to accept the patient.  Yes   Patient/family informed of [place]'s ownership interest in [facility] and [facility], as well as of the fact that they are under no obligation to receive care at these facilities.  PASRR submitted to EDS on       PASRR number received on       Existing PASRR number confirmed on 06/15/18     FL2 transmitted to all facilities in geographic area requested by pt/family on 06/15/18     FL2 transmitted to all facilities within larger geographic area on       Patient informed that his/her managed care company has contracts with or will negotiate with certain facilities, including the following:        Yes   Patient/family informed of bed offers received.  Patient chooses bed at [facility]     Physician recommends and patient chooses bed at      Patient to be  transferred to [facility] on 06/18/18.  Patient to be transferred to facility by PTAR     Patient family notified on 06/18/18 of transfer.  Name of family member notified:  Casey Wilkinson     PHYSICIAN       Additional Comment:    _______________________________________________ Burnis Medin, LCSW 06/18/2018, 2:02 PM

## 2018-06-18 NOTE — Progress Notes (Signed)
Report called to Nia at [facility].  Awaiting PTAR for transportation as set up by Education officer, museum.

## 2018-06-18 NOTE — Progress Notes (Signed)
PTAR here to get patient for discharge, VS reveal a temp of 102.8 axillary.  MD notified, discharge order [place].  Family  Member in room and made aware.  Tylenol administered.  No tele needed per MD.  Saline lock reinserted.

## 2018-06-18 NOTE — Discharge Summary (Addendum)
Physician Discharge Summary  Casey Wilkinson FBP:[id number] DOB: 23-Apr-1941 DOA: 06/14/2018  PCP: Shon Baton, MD  Admit date: 06/14/2018 Discharge date: 06/19/18 Admitted From: SNF Disposition:  SNF  Recommendations for Outpatient Follow-up:  1. Follow up with provider at SNF at earliest convenience 2. Follow-up with Dr. Mohamed/oncology at earliest convenience 3. Patient will benefit from outpatient evaluation by palliative care 4. Outpatient follow-up with pulmonary/Dr. 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  Patient was admitted with acute on chronic hypoxic respiratory failure with probable COPD flare superimposed on adenocarcinoma of the lung with extensive right lower lobe consolidation.  She was started on steroids and broad-spectrum antibiotics.  Pulmonary was consulted.  Antibiotics were discontinued.  Patient developed tachycardia, metoprolol was started.  Cardiology evaluated patient and recommended no further intervention.  Palliative care has also evaluated the patient.  Patient will be discharged back to the nursing home.  Addendum on 06/19/2018 Patient was supposed to be discharged on 06/18/2018 to SNF but she had a temperature spike of 102.8 so discharge was held.  She had a low-grade temperature this morning but she feels okay to go back to SNF.  She is probably spiking temperatures from her lung cancer which is  probably getting worse.  If she spikes temperature again at the SNF but if she feels okay, she can be treated with antibiotics at the SNF.  The most important thing for her is to get a PET scan done which can only happen as an outpatient.  Once that happens, if the cancer has spread even more, patient is considering hospice.  If condition gets worse in the SNF, consider hospice/comfort measures in the SNF.  Patient and family members are okay for her to go back to SNF today.    Discharge Diagnoses:  Principal Problem:   Acute on chronic respiratory failure with hypoxia (HCC) Active Problems:   COPD (chronic obstructive pulmonary disease) (HCC)   LUPUS ERYTHEMATOSUS, DISCOID   Adenocarcinoma of right lung (HCC)   Anemia   Hypoxia  Acute on chronic hypoxic respiratory failure secondary to COPD exacerbation superimposed on underlying adenocarcinoma of the lung with extensive right lower lobe consolidation -Respiratory status is improving.  Currently on 3 L oxygen via nasal cannula -Pulmonary evaluation and follow-up has been appreciated: Antibiotics have been discontinued as per pulmonary recommendations.    Continue prednisone 20 mg daily for 7 days.  Continue inhaled treatments; and nebs as needed. -Outpatient follow-up with pulmonary.  Overall prognosis is guarded to poor  Tachycardia/NSVT -Questionable cause.  As symptomatic.  Continue metoprolol which was started on 06/17/2018 and dose can be titrated upwards.  Cardiology evaluation appreciated.  No indication for further cardiac work-up as per cardiology.   Cardiology has signed off. -Heart rate still tachycardic but slightly improved.  Adenocarcinoma of the right lung stage IIIa/IV with extensive right lower lobe consultation -patient has received radiation treatment.  Patient's PET scan has been rescheduled.  Outpatient follow-up with Dr. Julien Nordmann -Overall prognosis is guarded to poor.  Palliative care evaluation appreciated.  Patient  will benefit from outpatient follow-up by palliative care in the nursing home.  Chronic anemia -Status post 1 unit packed red cell transfusion on 06/14/2018 -Currently hemoglobin stable.  Monitor as an outpatient  Generalized deconditioning -Continue PT evaluation once the patient returns to nursing home.  Patient should get her outpatient PET scan done which will help in deciding the next course of action with her oncologist.  Patient currently gets tachycardic with PT, will hold off on aggressive PT until patient gets PET scan.   Discharge Instructions  Discharge Instructions    Ambulatory referral to Oncology   Complete by:  As directed    Ambulatory referral to Pulmonology   Complete by:  As directed    Hospital followup   Call MD for:  difficulty breathing, headache or visual disturbances   Complete by:  As directed    Call MD for:  extreme fatigue   Complete by:  As directed    Call MD for:  hives   Complete by:  As directed    Call MD for:  persistant dizziness or light-headedness   Complete by:  As directed    Call MD for:  persistant nausea and vomiting   Complete by:  As directed    Call MD for:  severe uncontrolled pain   Complete by:  As directed    Call MD for:  temperature >100.4   Complete by:  As directed    Diet - low sodium heart healthy   Complete by:  As directed    Increase activity slowly   Complete by:  As directed      Allergies as of 06/18/2018      Reactions   Prochlorperazine Edisylate Nausea Only, Other (See Comments)   **COMPAZINE**   Stroke-like symptoms   Sulfamethoxazole Nausea Only, Other (See Comments)   Pt taking Methotrexate, Sulfur drugs could cause SEVERE fatal reaction.   Doxycycline Hives      Medication List    STOP taking these medications   levofloxacin 500 MG tablet Commonly known as:  LEVAQUIN     TAKE these medications   acetaminophen 325 MG tablet Commonly known as:  TYLENOL Take 1,300 mg by mouth every 8 (eight)  hours as needed for mild pain.   albuterol 108 (90 Base) MCG/ACT inhaler Commonly known as:  PROVENTIL HFA;VENTOLIN HFA Inhale 2 puffs into the lungs every 4 (four) hours as needed for wheezing or shortness of breath.   atorvastatin 10 MG tablet Commonly known as:  LIPITOR Take 10 mg by mouth daily.   B-12 2500 MCG Tabs Take 2,500 mcg by mouth daily.   Calcium Carbonate-Vitamin D 600-400 MG-UNIT tablet Take 1 tablet by mouth every morning.   docusate sodium 100 MG capsule Commonly known as:  COLACE Take 100 mg by mouth at bedtime.   famotidine 20 MG tablet Commonly known as:  PEPCID Take 20 mg by mouth 2 (two) times daily.   feeding supplement (ENSURE ENLIVE) Liqd Take 237 mLs by mouth 3 (three) times daily between meals.   folic acid 1 MG tablet Commonly known as:  FOLVITE Take 1 mg by mouth every morning.   guaiFENesin 600 MG 12 hr tablet Commonly known as:  MUCINEX Take 600 mg by mouth 2 (two) times daily as needed for cough.   HYDROcodone-homatropine 5-1.5 MG/5ML syrup Commonly known as:  HYCODAN Take 5 mLs by mouth at bedtime and may repeat dose one time if needed.   ipratropium-albuterol 0.5-2.5 (3) MG/3ML  Soln Commonly known as:  DUONEB Take 3 mLs by nebulization every 4 (four) hours as needed (shortness of breath/wheezing).   iron polysaccharides 150 MG capsule Commonly known as:  NIFEREX Take 150 mg by mouth daily.   lidocaine 2 % solution Commonly known as:  XYLOCAINE caregiver: Mix 1part 2% viscous lidocaine, 1part H20. Swallow 69mL of diluted mixture, 51min before meals and at bedtime, up to QID   loratadine 10 MG tablet Commonly known as:  CLARITIN Take 10 mg by mouth daily.   metoprolol tartrate 25 MG tablet Commonly known as:  LOPRESSOR Take 1 tablet (25 mg total) by mouth 2 (two) times daily.   mirtazapine 7.5 MG tablet Commonly known as:  REMERON Take 1 tablet (7.5 mg total) by mouth at bedtime.   multivitamin with minerals Tabs  tablet Take 1 tablet by mouth daily.   polyethylene glycol packet Commonly known as:  MIRALAX / GLYCOLAX Take 17 g by mouth daily as needed for mild constipation.   Potassium 99 MG Tabs Take 99 mg by mouth daily.   predniSONE 20 MG tablet Commonly known as:  DELTASONE Take 1 tablet (20 mg total) by mouth daily for 7 days. What changed:    medication strength  how much to take  how to take this  when to take this  additional instructions   traMADol 50 MG tablet Commonly known as:  ULTRAM Take 1 tablet (50 mg total) by mouth every 6 (six) hours as needed for moderate pain.   TRELEGY ELLIPTA 100-62.5-25 MCG/INH Aepb Generic drug:  Fluticasone-Umeclidin-Vilant USE 1 INHALATION DAILY What changed:  See the new instructions.   valsartan-hydrochlorothiazide 160-12.5 MG tablet Commonly known as:  DIOVAN-HCT Take 1 tablet by mouth every morning.      Follow-up Information    Shon Baton, MD. Schedule an appointment as soon as possible for a visit in 1 week(s).   Specialty:  Internal Medicine Contact information: [address] [place] [address] [phone]        Palliative care. Schedule an appointment as soon as possible for a visit in 1 week(s).        Curt Bears, MD. Schedule an appointment as soon as possible for a visit in 1 week(s).   Specialty:  Oncology Contact information: [place] [address] [phone]        Marshell Garfinkel, MD. Schedule an appointment as soon as possible for a visit in 1 week(s).   Specialty:  Pulmonary Disease Contact information: [address] [place] [address] [phone]          Allergies  Allergen Reactions  . Prochlorperazine Edisylate Nausea Only and Other (See Comments)    **COMPAZINE**   Stroke-like symptoms  . Sulfamethoxazole Nausea Only and Other (See Comments)    Pt taking Methotrexate, Sulfur drugs could cause SEVERE fatal reaction.  .  Doxycycline Hives    Consultations: Pulmonary/cardiology/palliative care  Procedures/Studies: Dg Chest 1 View  Result Date: 06/10/2018 CLINICAL DATA:  Status post right thoracentesis today. EXAM: CHEST  1 VIEW COMPARISON:  Single-view of the chest 05/15/2018. CT chest 05/12/2018. FINDINGS: Right pleural effusion appears decreased compared to the prior plain film. No pneumothorax is identified. There is no left effusion. Right basilar airspace disease is again seen. The lungs are emphysematous. Heart size is normal. Aortic atherosclerosis is noted. IMPRESSION: Decreased right pleural effusion after thoracentesis. Negative for pneumothorax. Right basilar airspace disease most compatible with atelectasis. Emphysema. Atherosclerosis. Electronically Signed  By: Inge Rise M.D.   On: 06/10/2018 10:31   Dg Chest 2 View  Result Date: 06/17/2018 CLINICAL DATA:  Shortness of breath.  History of colon cancer, COPD. EXAM: CHEST - 2 VIEW COMPARISON:  Chest radiograph June 14, 2018 and CT chest June 14, 2018 FINDINGS: Persistent consolidation RIGHT lower lobe. Chronic interstitial changes and increased lung volumes with flattened hemidiaphragms. Small RIGHT pleural effusion. Small nodular densities unchanged. No pneumothorax. Cardiac silhouette is mildly enlarged. Calcified aortic arch. Soft tissue planes and included osseous structures are unchanged. IMPRESSION: 1. Stable appearance of RIGHT lower lobe consolidation/pneumonia. 2. Mild cardiomegaly. 3.  Aortic Atherosclerosis (ICD10-I70.0). 4.  Emphysema (ICD10-J43.9). Electronically Signed   By: Elon Alas M.D.   On: 06/17/2018 13:44   Dg Chest 2 View  Result Date: 06/14/2018 CLINICAL DATA:  Hypoxia, shortness of breath, lung cancer EXAM: CHEST - 2 VIEW COMPARISON:  06/10/2018 FINDINGS: Masslike right lower lobe opacity, possibly reflecting combination of tumor and/or superimposed infection, progressed. Suspected small right pleural effusion.  Left lung is clear. No pneumothorax. The heart is normal in size. IMPRESSION: Progressive masslike right lower lobe opacity, possibly reflecting combination of tumor and/or superimposed infection, with associated small right pleural effusion. Electronically Signed   By: Julian Hy M.D.   On: 06/14/2018 10:27   Ct Chest W Contrast  Result Date: 06/14/2018 CLINICAL DATA:  Productive cough. Hemoptysis and wheezing. Progressive right lower lobe opacity on chest radiography. EXAM: CT CHEST WITH CONTRAST TECHNIQUE: Multidetector CT imaging of the chest was performed during intravenous contrast administration. CONTRAST:  77mL OMNIPAQUE IOHEXOL 300 MG/ML  SOLN COMPARISON:  05/12/2017 FINDINGS: Cardiovascular: Coronary, aortic arch, and branch vessel atherosclerotic vascular disease. Mediastinum/Nodes: Scattered small mediastinal lymph nodes including an 8 mm paratracheal node on image 67/2 which is stable, and a 9 mm right hilar lymph node. Currently no overtly pathologic adenopathy. Lungs/Pleura: There is consolidation of much of the right lung with some sparing of the superior segment. Air bronchograms within the consolidated portion. No central obstructing bronchial lesion. However, within the consolidated portion, there several hypodense areas including a 2.0 by 2.0 peripheral hypodensity on image 117/2 and a 2.9 by 1.4 cm posterior hypodensity on image 119/2. Neither of these 2 hypodense areas was readily apparent on 05/12/2018. Several other vague hypodense areas are noted within the right lower lobe consolidation. Severe centrilobular emphysema. 0.8 by 0.5 cm left upper lobe nodule on image 101/7, stable. 0.7 by 0.5 cm left lower lobe nodule on image 106/7, stable. There is atelectasis medially in the left lower lobe. Upper Abdomen: Hypodense triangular regions in the spleen are probably from splenic infarcts particularly in light of the faint perisplenic edema, and less likely to be due to early vascular  phase. Advanced atherosclerosis of the splenic artery noted. Advanced abdominal aortic atherosclerosis noted with extensive plaque formation intruding on the lumen just below the level of the renal arteries. Stable nodularity of the left adrenal gland. Musculoskeletal: Thoracic spondylosis. IMPRESSION: 1. Continued dense consolidation in the right lower lobe with sparing of the superior segment. There are several new hypodense regions within the consolidated right lower lobe which could be from abscess, pulmonary infarct, or tumor. The consolidation could be from radiation pneumonitis, pneumonia, or less likely bronchopulmonary spread of tumor. There is no central truncation of the tracheobronchial tree. Borderline adenopathy in the chest. 2. 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 Origin uncertain but there is advanced atherosclerosis of the splenic artery and abdominal aorta. 3. Stable nodularity of the left  adrenal gland. 4.  Aortic Atherosclerosis (ICD10-I70.0).  Coronary atherosclerosis. 5. Emphysema (ICD10-J43.9). 6. There are 2 stable left lung pulmonary nodules which warrants surveillance. Electronically Signed   By: Van Clines M.D.   On: 06/14/2018 13:16   US Thoracentesis Asp Pleural Space W/img Guide  Result Date: 06/10/2018 INDICATION: Patient with prior history of colon cancer and now with newly diagnosed lung cancer, dyspnea, right pleural effusion. Request made for diagnostic and therapeutic right thoracentesis. EXAM: ULTRASOUND GUIDED DIAGNOSTIC AND THERAPEUTIC RIGHT THORACENTESIS MEDICATIONS: None COMPLICATIONS: None immediate. PROCEDURE: An ultrasound guided thoracentesis was thoroughly discussed with the patient and questions answered. The benefits, risks, alternatives and complications were also discussed. The patient understands and wishes to proceed with the procedure. Written consent was obtained. Ultrasound was performed to localize and mark an adequate pocket of fluid in the right chest.  The area was then prepped and draped in the normal sterile fashion. 1% Lidocaine was used for local anesthesia. Under ultrasound guidance a 6 Fr Safe-T-Centesis catheter was introduced. Thoracentesis was performed. The catheter was removed and a dressing applied. FINDINGS: A total of approximately 50 cc of yellow fluid was removed. Samples were sent to the laboratory as requested by the clinical team. The right pleural effusion was very small. No significant left pleural effusion noted. IMPRESSION: Successful ultrasound guided diagnostic and therapeutic right thoracentesis yielding 50 cc of pleural fluid. Read by: Rowe Robert, PA-C Electronically Signed   By: Jerilynn Mages.  Shick M.D.   On: 06/10/2018 10:15      Subjective: Patient seen and examined at bedside.  She feels very weak but feels okay to go back to nursing home.  Denies any chest pain or worsening shortness of breath.  No overnight fever or vomiting.  Discharge Exam: Vitals:   06/18/18 0830 06/18/18 1013  BP:    Pulse:  (!) 114  Resp:    Temp:    SpO2: 95%    Vitals:   06/18/18 0445 06/18/18 0500 06/18/18 0830 06/18/18 1013  BP: (!) 116/54     Pulse: (!) 109   (!) 114  Resp: 18     Temp: 98.2 F (36.8 C)     TempSrc: Oral     SpO2: 91%  95%   Weight:  70 kg    Height:        General: Pt is alert, awake.  Elderly female lying in bed.  No distress Cardiovascular: Intermittently tachycardic, S1/S2 + Respiratory: bilateral decreased breath sounds at bases with some scattered crackles Abdominal: Soft, NT, ND, bowel sounds + Extremities: no edema, no cyanosis    The results of significant diagnostics from this hospitalization (including imaging, microbiology, ancillary and laboratory) are listed below for reference.     Microbiology: Recent Results (from the past 240 hour(s))  Blood culture (routine x 2)     Status: None (Preliminary result)   Collection Time: 06/14/18  9:48 AM  Result Value Ref Range Status   Specimen  Description   Final    BLOOD RIGHT ARM Performed at [facility] [address] [place], [place] [address]    Special Requests   Final    BOTTLES DRAWN AEROBIC AND ANAEROBIC Blood Culture adequate volume   Culture   Final    NO GROWTH 4 DAYS Performed at [facility], [place] [address] [place], [place] [address]    Report Status PENDING  Incomplete  Blood culture (routine x 2)     Status: None (Preliminary result)  Collection Time: 06/14/18 10:02 AM  Result Value Ref Range Status   Specimen Description   Final    BLOOD LEFT ARM Performed at [facility] [address] [place], [place] [address]    Special Requests   Final    BOTTLES DRAWN AEROBIC AND ANAEROBIC Blood Culture adequate volume   Culture   Final    NO GROWTH 4 DAYS Performed at [facility], [place] [address] [place], [place] [address]    Report Status PENDING  Incomplete     Labs: BNP (last 3 results) Recent Labs    05/12/18 1838 06/14/18 0948 06/15/18 1022  BNP 49.7 117.4* 671.2*   Basic Metabolic Panel: Recent Labs  Lab 06/14/18 0948 06/15/18 1022 06/16/18 0507 06/17/18 0342 06/18/18 0530  NA 136 141 138 137 136  K 4.2 3.2* 4.9 4.1 3.5  CL 98 105 106 99 96*  CO2 27 26 28  32 32  GLUCOSE 100* 111* 84 93 92  BUN 24* 17 14 12 21   CREATININE 0.81 0.62 0.47 0.58 0.56  CALCIUM 8.7* 8.4* 8.3* 8.5* 8.3*  MG  --   --   --   --  1.9   Liver Function Tests: Recent Labs  Lab 06/14/18 0948  AST 24  ALT 18  ALKPHOS 77  BILITOT 0.8  PROT 6.4*  ALBUMIN 2.3*   No results for input(s): LIPASE, AMYLASE in the last 168 hours. No results for input(s): AMMONIA in the last 168 hours. CBC: Recent Labs  Lab 06/14/18 0948 06/14/18 2128 06/15/18 1022 06/16/18 0507 06/17/18 0342 06/18/18 0530  WBC 6.3  --  6.4 6.5 7.5 6.7  NEUTROABS 5.8  --  5.6  --   --  5.7  HGB 7.9* 8.8* 9.3* 9.0* 9.6* 8.9*  HCT 24.8* 27.1* 29.2* 28.3* 29.8* 27.5*  MCV 90.5  --   92.1 92.2 90.6 90.5  PLT 323  --  287 294 279 272   Cardiac Enzymes: Recent Labs  Lab 06/14/18 0948  TROPONINI 0.05*   BNP: Invalid input(s): POCBNP CBG: No results for input(s): GLUCAP in the last 168 hours. D-Dimer No results for input(s): DDIMER in the last 72 hours. Hgb A1c No results for input(s): HGBA1C in the last 72 hours. Lipid Profile No results for input(s): CHOL, HDL, LDLCALC, TRIG, CHOLHDL, LDLDIRECT in the last 72 hours. Thyroid function studies No results for input(s): TSH, T4TOTAL, T3FREE, THYROIDAB in the last 72 hours.  Invalid input(s): FREET3 Anemia work up No results for input(s): VITAMINB12, FOLATE, FERRITIN, TIBC, IRON, RETICCTPCT in the last 72 hours. Urinalysis    Component Value Date/Time   COLORURINE YELLOW 06/14/2018 1740   APPEARANCEUR CLEAR 06/14/2018 1740   LABSPEC 1.043 (H) 06/14/2018 1740   PHURINE 6.0 06/14/2018 1740   GLUCOSEU NEGATIVE 06/14/2018 1740   HGBUR NEGATIVE 06/14/2018 1740   BILIRUBINUR NEGATIVE 06/14/2018 1740   KETONESUR 5 (A) 06/14/2018 1740   PROTEINUR NEGATIVE 06/14/2018 1740   NITRITE NEGATIVE 06/14/2018 1740   LEUKOCYTESUR NEGATIVE 06/14/2018 1740   Sepsis Labs Invalid input(s): PROCALCITONIN,  WBC,  LACTICIDVEN Microbiology Recent Results (from the past 240 hour(s))  Blood culture (routine x 2)     Status: None (Preliminary result)   Collection Time: 06/14/18  9:48 AM  Result Value Ref Range Status   Specimen Description   Final    BLOOD RIGHT ARM Performed at [facility], [place] [address] [place],  [address]    Special Requests   Final  BOTTLES DRAWN AEROBIC AND ANAEROBIC Blood Culture adequate volume   Culture   Final    NO GROWTH 4 DAYS Performed at [facility], [place] [address] [place], [place] [address]    Report Status PENDING  Incomplete  Blood culture (routine x 2)     Status: None (Preliminary result)   Collection Time: 06/14/18 10:02 AM  Result Value Ref Range  Status   Specimen Description   Final    BLOOD LEFT ARM Performed at [facility] [address], [place], [place] [address]    Special Requests   Final    BOTTLES DRAWN AEROBIC AND ANAEROBIC Blood Culture adequate volume   Culture   Final    NO GROWTH 4 DAYS Performed at [facility], [place] [address] [place], [place] [address]    Report Status PENDING  Incomplete     Time coordinating discharge: 35 minutes  SIGNED:   Aline August, MD  [facility] 06/18/2018, 12:36 PM Pager: [phone]  If 7PM-7AM, please contact night-coverage [email or web address] Password [email or web address]

## 2018-06-18 NOTE — Consult Note (Signed)
   Casey Wilkinson   06/18/2018  Casey Wilkinson 14-Mar-1941 [id number]    Patient active with [facility] services. She is followed by [facility] LCSW while at SNF.   Chart reviewed. Noted [name] is slated for discharge back to [facility] today. Also noted palliative evaluation needed while at SNF (per DC summary).  Will update [facility] LCSW.   Marthenia Rolling, MSN-Ed, RN,BSN [facility] Liaison [phone]

## 2018-06-18 NOTE — Progress Notes (Signed)
CMT informed the RN that the patient had 7 beats of V-Tach. Will notify the PCP on call.

## 2018-06-18 NOTE — Evaluation (Signed)
Physical Therapy Evaluation Patient Details Name: Casey Wilkinson MRN: [id number] DOB: 08-19-1941 Today's Date: 06/18/2018   History of Present Illness  77 y.o. female  with past medical history of right lung stage IIIA/IV adenocarcinoma (s/p radiation, PET pending), colon cancer (in remission), sq cell cancer of leg, COPD on 2 L oxygen, HTN, discoid lupus admitted on 06/14/2018 with SOB and tachycardia with treatment of COPD exacerbation with underlying lung cancer  Clinical Impression  Pt admitted with above diagnosis. Pt currently with functional limitations due to the deficits listed below (see PT Problem List).  Pt will benefit from skilled PT to increase their independence and safety with mobility to allow discharge to the venue listed below.  Pt agreeable to mobilize however fatigues very quickly and reports generalized weakness.  Pt reports she was ambulating short distances at SNF however limited by elevated HR.    Follow Up Recommendations SNF    Equipment Recommendations  Wheelchair cushion (measurements PT);Wheelchair (measurements PT);Hospital bed    Recommendations for Other Services       Precautions / Restrictions Precautions Precautions: Fall Precaution Comments: monitor sats, HR; chronic 2L O2      Mobility  Bed Mobility               General bed mobility comments: pt up in recliner on arrival  Transfers Overall transfer level: Needs assistance Equipment used: Rolling walker (2 wheeled) Transfers: Sit to/from Stand Sit to Stand: Min assist         General transfer comment: verbal cues for hand placement, assist to rise and steady as well as control descent  Ambulation/Gait Ambulation/Gait assistance: Min assist;+2 safety/equipment Gait Distance (Feet): 5 Feet Assistive device: Rolling walker (2 wheeled) Gait Pattern/deviations: Decreased stride length;Step-through pattern;Trunk flexed     General Gait Details: very slow pace, only able to  tolerate 5 feet, reports weakness, SPO2 91% on 3L O2 and HR 121 bpm; fatigues quickly  Stairs            Wheelchair Mobility    Modified Rankin (Stroke Patients Only)       Balance Overall balance assessment: Needs assistance;History of Falls         Standing balance support: Bilateral upper extremity supported Standing balance-Leahy Scale: Poor                               Pertinent Vitals/Pain Pain Assessment: No/denies pain    Home Living Family/patient expects to be discharged to:: Skilled nursing facility                 Additional Comments: 2L home O2    Prior Function           Comments: independent prior to last admission with husband performing IADLs and pt performing ADLs and light IADLs; pt admitted from SNF - walking short distances with RW however would have elevated HR     Hand Dominance        Extremity/Trunk Assessment   Upper Extremity Assessment Upper Extremity Assessment: Generalized weakness    Lower Extremity Assessment Lower Extremity Assessment: Generalized weakness       Communication   Communication: No difficulties  Cognition Arousal/Alertness: Awake/alert Behavior During Therapy: Flat affect Overall Cognitive Status: Within Functional Limits for tasks assessed  General Comments      Exercises     Assessment/Plan    PT Assessment Patient needs continued PT services  PT Problem List Decreased mobility;Decreased strength;Cardiopulmonary status limiting activity;Decreased balance;Decreased knowledge of use of DME;Decreased activity tolerance       PT Treatment Interventions DME instruction;Gait training;Therapeutic exercise;Therapeutic activities;Functional mobility training;Patient/family education;Balance training    PT Goals (Current goals can be found in the Care Plan section)  Acute Rehab PT Goals PT Goal Formulation: With  patient/family Time For Goal Achievement: 07/02/18 Potential to Achieve Goals: Good    Frequency Min 2X/week   Barriers to discharge        Co-evaluation               AM-PAC PT "6 Clicks" Daily Activity  Outcome Measure Difficulty turning over in bed (including adjusting bedclothes, sheets and blankets)?: A Lot Difficulty moving from lying on back to sitting on the side of the bed? : Unable Difficulty sitting down on and standing up from a chair with arms (e.g., wheelchair, bedside commode, etc,.)?: Unable Help needed moving to and from a bed to chair (including a wheelchair)?: A Little Help needed walking in hospital room?: A Lot Help needed climbing 3-5 steps with a railing? : Total 6 Click Score: 10    End of Session Equipment Utilized During Treatment: Gait belt;Oxygen Activity Tolerance: Patient limited by fatigue Patient left: with family/visitor present;with call bell/phone within reach;in chair Nurse Communication: Mobility status PT Visit Diagnosis: Other abnormalities of gait and mobility (R26.89)    Time: 8546-2703 PT Time Calculation (min) (ACUTE ONLY): 23 min   Charges:   PT Evaluation $PT Eval Low Complexity: 1 Low         Carmelia Bake, PT, DPT 06/18/2018 Pager: [phone]  York Ram E 06/18/2018, 11:45 AM

## 2018-06-18 NOTE — Care Management Note (Signed)
Case Management Note  Patient Details  Name: EVALYNNE LOCURTO MRN: [id number] Date of Birth: September 20, 1941  Subjective/Objective: d/c order.d/c SNF-CSW following.                   Action/Plan:d/c SNF.   Expected Discharge Date:  06/18/18               Expected Discharge Plan:  Skilled Nursing Facility  In-House Referral:  Clinical Social Work  Discharge planning Services  CM Consult  Post Acute Care Choice:    Choice offered to:     DME Arranged:    DME Agency:     HH Arranged:    [place] Agency:     Status of Service:  Completed, signed off  If discussed at [employer], dates discussed:    Additional Comments:  Dessa Phi, RN 06/18/2018, 12:43 PM

## 2018-06-19 ENCOUNTER — Ambulatory Visit: Payer: Self-pay | Admitting: *Deleted

## 2018-06-19 ENCOUNTER — Inpatient Hospital Stay (HOSPITAL_COMMUNITY): Payer: Medicare Other

## 2018-06-19 DIAGNOSIS — R918 Other nonspecific abnormal finding of lung field: Secondary | ICD-10-CM | POA: Diagnosis not present

## 2018-06-19 DIAGNOSIS — J189 Pneumonia, unspecified organism: Secondary | ICD-10-CM | POA: Diagnosis not present

## 2018-06-19 DIAGNOSIS — I700 Atherosclerosis of aorta: Secondary | ICD-10-CM | POA: Diagnosis not present

## 2018-06-19 DIAGNOSIS — Z5111 Encounter for antineoplastic chemotherapy: Secondary | ICD-10-CM | POA: Diagnosis not present

## 2018-06-19 DIAGNOSIS — J439 Emphysema, unspecified: Secondary | ICD-10-CM | POA: Diagnosis not present

## 2018-06-19 DIAGNOSIS — I9589 Other hypotension: Secondary | ICD-10-CM | POA: Diagnosis not present

## 2018-06-19 DIAGNOSIS — Z85038 Personal history of other malignant neoplasm of large intestine: Secondary | ICD-10-CM | POA: Diagnosis not present

## 2018-06-19 DIAGNOSIS — Z882 Allergy status to sulfonamides status: Secondary | ICD-10-CM | POA: Diagnosis not present

## 2018-06-19 DIAGNOSIS — J449 Chronic obstructive pulmonary disease, unspecified: Secondary | ICD-10-CM | POA: Diagnosis not present

## 2018-06-19 DIAGNOSIS — Z881 Allergy status to other antibiotic agents status: Secondary | ICD-10-CM | POA: Diagnosis not present

## 2018-06-19 DIAGNOSIS — J9621 Acute and chronic respiratory failure with hypoxia: Secondary | ICD-10-CM | POA: Diagnosis not present

## 2018-06-19 DIAGNOSIS — I10 Essential (primary) hypertension: Secondary | ICD-10-CM | POA: Diagnosis not present

## 2018-06-19 DIAGNOSIS — I2510 Atherosclerotic heart disease of native coronary artery without angina pectoris: Secondary | ICD-10-CM | POA: Diagnosis not present

## 2018-06-19 DIAGNOSIS — L930 Discoid lupus erythematosus: Secondary | ICD-10-CM | POA: Diagnosis not present

## 2018-06-19 DIAGNOSIS — D638 Anemia in other chronic diseases classified elsewhere: Secondary | ICD-10-CM | POA: Diagnosis not present

## 2018-06-19 DIAGNOSIS — C3431 Malignant neoplasm of lower lobe, right bronchus or lung: Secondary | ICD-10-CM | POA: Diagnosis not present

## 2018-06-19 DIAGNOSIS — E785 Hyperlipidemia, unspecified: Secondary | ICD-10-CM | POA: Diagnosis not present

## 2018-06-19 DIAGNOSIS — Z79899 Other long term (current) drug therapy: Secondary | ICD-10-CM | POA: Diagnosis not present

## 2018-06-19 DIAGNOSIS — R0902 Hypoxemia: Secondary | ICD-10-CM | POA: Diagnosis not present

## 2018-06-19 DIAGNOSIS — D509 Iron deficiency anemia, unspecified: Secondary | ICD-10-CM | POA: Diagnosis not present

## 2018-06-19 DIAGNOSIS — Z7401 Bed confinement status: Secondary | ICD-10-CM | POA: Diagnosis not present

## 2018-06-19 DIAGNOSIS — J90 Pleural effusion, not elsewhere classified: Secondary | ICD-10-CM | POA: Diagnosis not present

## 2018-06-19 DIAGNOSIS — R278 Other lack of coordination: Secondary | ICD-10-CM | POA: Diagnosis not present

## 2018-06-19 DIAGNOSIS — M6281 Muscle weakness (generalized): Secondary | ICD-10-CM | POA: Diagnosis not present

## 2018-06-19 DIAGNOSIS — J17 Pneumonia in diseases classified elsewhere: Secondary | ICD-10-CM | POA: Diagnosis not present

## 2018-06-19 DIAGNOSIS — I959 Hypotension, unspecified: Secondary | ICD-10-CM | POA: Diagnosis not present

## 2018-06-19 DIAGNOSIS — C3490 Malignant neoplasm of unspecified part of unspecified bronchus or lung: Secondary | ICD-10-CM | POA: Diagnosis not present

## 2018-06-19 DIAGNOSIS — E46 Unspecified protein-calorie malnutrition: Secondary | ICD-10-CM | POA: Diagnosis not present

## 2018-06-19 DIAGNOSIS — R2689 Other abnormalities of gait and mobility: Secondary | ICD-10-CM | POA: Diagnosis not present

## 2018-06-19 DIAGNOSIS — M2550 Pain in unspecified joint: Secondary | ICD-10-CM | POA: Diagnosis not present

## 2018-06-19 DIAGNOSIS — R000 Tachycardia, unspecified: Secondary | ICD-10-CM | POA: Diagnosis not present

## 2018-06-19 DIAGNOSIS — C3491 Malignant neoplasm of unspecified part of right bronchus or lung: Secondary | ICD-10-CM | POA: Diagnosis not present

## 2018-06-19 LAB — CBC WITH DIFFERENTIAL/PLATELET
BASOS PCT: 0 %
Basophils Absolute: 0 10*3/uL (ref 0.0–0.1)
Eosinophils Absolute: 0.1 10*3/uL (ref 0.0–0.7)
Eosinophils Relative: 1 %
HEMATOCRIT: 29.5 % — AB (ref 36.0–46.0)
Hemoglobin: 9.3 g/dL — ABNORMAL LOW (ref 12.0–15.0)
LYMPHS ABS: 0.6 10*3/uL — AB (ref 0.7–4.0)
Lymphocytes Relative: 6 %
MCH: 28.9 pg (ref 26.0–34.0)
MCHC: 31.5 g/dL (ref 30.0–36.0)
MCV: 91.6 fL (ref 78.0–100.0)
MONOS PCT: 4 %
Monocytes Absolute: 0.3 10*3/uL (ref 0.1–1.0)
NEUTROS PCT: 89 %
Neutro Abs: 8.1 10*3/uL — ABNORMAL HIGH (ref 1.7–7.7)
Platelets: 253 10*3/uL (ref 150–400)
RBC: 3.22 MIL/uL — ABNORMAL LOW (ref 3.87–5.11)
RDW: 17.3 % — AB (ref 11.5–15.5)
WBC: 9 10*3/uL (ref 4.0–10.5)

## 2018-06-19 LAB — COMPREHENSIVE METABOLIC PANEL
ALBUMIN: 2 g/dL — AB (ref 3.5–5.0)
ALT: 17 U/L (ref 0–44)
ANION GAP: 9 (ref 5–15)
AST: 25 U/L (ref 15–41)
Alkaline Phosphatase: 89 U/L (ref 38–126)
BILIRUBIN TOTAL: 0.6 mg/dL (ref 0.3–1.2)
BUN: 22 mg/dL (ref 8–23)
CALCIUM: 8.5 mg/dL — AB (ref 8.9–10.3)
CO2: 31 mmol/L (ref 22–32)
Chloride: 98 mmol/L (ref 98–111)
Creatinine, Ser: 0.73 mg/dL (ref 0.44–1.00)
GFR calc Af Amer: >60 mL/min (ref >60)
GFR calc non Af Amer: >60 mL/min (ref >60)
GLUCOSE: 137 mg/dL — AB (ref 70–99)
POTASSIUM: 4.3 mmol/L (ref 3.5–5.1)
SODIUM: 138 mmol/L (ref 135–145)
TOTAL PROTEIN: 6.1 g/dL — AB (ref 6.5–8.1)

## 2018-06-19 LAB — MAGNESIUM: Magnesium: 1.8 mg/dL (ref 1.7–2.4)

## 2018-06-19 LAB — CULTURE, BLOOD (ROUTINE X 2)
Culture: NO GROWTH
Culture: NO GROWTH
SPECIAL REQUESTS: ADEQUATE
Special Requests: ADEQUATE

## 2018-06-19 LAB — ACID FAST CULTURE WITH REFLEXED SENSITIVITIES: ACID FAST CULTURE - AFSCU3: NEGATIVE

## 2018-06-19 LAB — ACID FAST CULTURE WITH REFLEXED SENSITIVITIES (MYCOBACTERIA)

## 2018-06-19 NOTE — Clinical Social Work Placement (Signed)
Patient's discharge cancelled on 06/18/18.  Patient returning to [facility] SNF. Facility aware of patient's discharge and confirmed patient's ability to return. PTAR contacted, patient's family notified. Patient's RN can call report to [phone] Room 3206, packet complete. CSW signing off, no other needs identified at this time.   CLINICAL SOCIAL WORK PLACEMENT  NOTE  Date:  06/19/2018  Patient Details  Name: Casey Wilkinson MRN: [id number] Date of Birth: 1941/01/05  Clinical Social Work is seeking post-discharge placement for this patient at the [place] level of care (*CSW will initial, date and re-position this form in  chart as items are completed):  Yes   Patient/family provided with [facility] Work Department's list of facilities offering this level of care within the geographic area requested by the patient (or if unable, by the patient's family).  Yes   Patient/family informed of their freedom to choose among providers that offer the needed level of care, that participate in Medicare, Medicaid or managed care program needed by the patient, have an available bed and are willing to accept the patient.  Yes   Patient/family informed of [facility]'s ownership interest in [facility] and [facility], as well as of the fact that they are under no obligation to receive care at these facilities.  PASRR submitted to EDS on       PASRR number received on       Existing PASRR number confirmed on 06/15/18     FL2 transmitted to all facilities in geographic area requested by pt/family on 06/15/18     FL2 transmitted to all facilities within larger geographic area on       Patient informed that his/her managed care company has contracts with or will negotiate with certain facilities, including the following:        Yes   Patient/family informed of bed offers received.  Patient [facility] bed at [facility]     Physician  recommends and patient chooses bed at      Patient to be transferred to [facility] on 06/19/18.  Patient to be transferred to facility by PTAR     Patient family notified on 06/19/18 of transfer.  Name of family member notified:  Carl Best     PHYSICIAN       Additional Comment:    _______________________________________________ Burnis Medin, LCSW 06/19/2018, 12:11 PM

## 2018-06-19 NOTE — Progress Notes (Signed)
Patient ID: DIVINA NEALE, female   DOB: 1941-07-22, 77 y.o.   MRN: [id number] Patient was seen and examined at bedside and plan of care discussed with the patient and family members present at bedside.  Patient was supposed to be discharged on 06/18/2018 to SNF but she had a temperature spike of 102.8 so discharge was held.  She had a low-grade temperature this morning but she feels okay to go back to SNF.  She is probably spiking temperatures from her lung cancer which is probably getting worse.  If she spikes temperature again at the SNF but if she feels okay, she can be treated with antibiotics at the SNF.  The most important thing for her is to get a PET scan done which can only happen as an outpatient.  Once that happens, if the cancer has spread even more, patient is considering hospice.  If condition gets worse in the SNF, consider hospice/comfort measures in the SNF.  Patient and family members are okay for her to go back to SNF today.

## 2018-06-19 NOTE — Care Management Important Message (Signed)
Important Message  Patient Details  Name: SHERRAL DIROCCO MRN: [id number] Date of Birth: 07/18/1941   Medicare Important Message Given:  Yes    Kerin Salen 06/19/2018, 1:15 PMImportant Message  Patient Details  Name: MYKAILA BLUNCK MRN: [id number] Date of Birth: [birth year]   Medicare Important Message Given:  Yes    Kerin Salen 06/19/2018, 1:15 PM

## 2018-06-23 ENCOUNTER — Other Ambulatory Visit: Payer: Self-pay | Admitting: *Deleted

## 2018-06-23 NOTE — Patient Outreach (Signed)
[facility] ([facility] Care Management  06/23/2018  Cadince G Barhorst 10/23/1940 [id number]   CSW met with patient, husband & son, Kevin at bedside to discuss patient's recent hospitalization and rehab stay. Patient was readmitted to [facility] 8/25 for acute on chronic hypoxic respiratory failure with probable COPD flare superimposed on adenocarcinoma of the lung with extensive right lower lobe consolidation. Patient was put on broad spectrum antibiotics and steroids and discharged back to [facility] on 8/30. Patient is anxious about PET scan, which had to be cancelled due to hospitalization which can only be done as outpatient. Patient hopes to get stabilized at [facility] to be able to have PET scan rescheduled as "the unknown" is making her anxious, patient reports that she has been started on an antidepressant, Mirtazepine which should also help with her appetite. CSW also sat in on bedside careplan meeting with social worker, Peggy & Physical therapist who reported that she has been unable to participate with therapy due to oxygen levels dropping to 81, she was bumped up to 4.5L 02 and her oxygen level was at 90% during careplan. CSW will check back with patient in 2 weeks.     , LCSW [facility]  Clinical Social Worker cell #: [phone]  

## 2018-06-24 ENCOUNTER — Telehealth: Payer: Self-pay | Admitting: Medical Oncology

## 2018-06-24 DIAGNOSIS — R0902 Hypoxemia: Secondary | ICD-10-CM | POA: Diagnosis not present

## 2018-06-24 DIAGNOSIS — J449 Chronic obstructive pulmonary disease, unspecified: Secondary | ICD-10-CM | POA: Diagnosis not present

## 2018-06-24 DIAGNOSIS — I10 Essential (primary) hypertension: Secondary | ICD-10-CM | POA: Diagnosis not present

## 2018-06-24 DIAGNOSIS — C3431 Malignant neoplasm of lower lobe, right bronchus or lung: Secondary | ICD-10-CM | POA: Diagnosis not present

## 2018-06-24 LAB — CULTURE, BLOOD (ROUTINE X 2)
CULTURE: NO GROWTH
Culture: NO GROWTH
SPECIAL REQUESTS: ADEQUATE
Special Requests: ADEQUATE

## 2018-06-24 NOTE — Telephone Encounter (Signed)
Asking about PET scan -reporting that pt having  low sats - titrating her oxygen to keep oxygen sat above 88%. No fever, no other symptoms. SHe is up to 90 % on 4-5l. PET scan appt next week and Gina notified.

## 2018-06-25 ENCOUNTER — Other Ambulatory Visit: Payer: Self-pay | Admitting: *Deleted

## 2018-06-25 DIAGNOSIS — J449 Chronic obstructive pulmonary disease, unspecified: Secondary | ICD-10-CM | POA: Diagnosis not present

## 2018-06-25 DIAGNOSIS — C3490 Malignant neoplasm of unspecified part of unspecified bronchus or lung: Secondary | ICD-10-CM | POA: Diagnosis not present

## 2018-06-25 DIAGNOSIS — E46 Unspecified protein-calorie malnutrition: Secondary | ICD-10-CM | POA: Diagnosis not present

## 2018-06-25 DIAGNOSIS — J9621 Acute and chronic respiratory failure with hypoxia: Secondary | ICD-10-CM | POA: Diagnosis not present

## 2018-06-25 DIAGNOSIS — I10 Essential (primary) hypertension: Secondary | ICD-10-CM | POA: Diagnosis not present

## 2018-06-25 NOTE — Patient Outreach (Signed)
[facility]) Care Management  06/25/2018  LASHELLE KOY 1941/07/11 [id number]   Onsite visit to [facility]. Met with Vickii Chafe, SW at facility. Patient has been referred to [facility] care management. She reports that patient has a PET scan next week and depending on the results patient and family may elect Hospice.   Plan to update [facility] care team and [facility].  [name]. [name], RN, BSN, [place] [phone]) Business Cell  [phone]) Toll Free Office

## 2018-06-26 DIAGNOSIS — I10 Essential (primary) hypertension: Secondary | ICD-10-CM | POA: Diagnosis not present

## 2018-06-26 DIAGNOSIS — D638 Anemia in other chronic diseases classified elsewhere: Secondary | ICD-10-CM | POA: Diagnosis not present

## 2018-06-26 DIAGNOSIS — C3431 Malignant neoplasm of lower lobe, right bronchus or lung: Secondary | ICD-10-CM | POA: Diagnosis not present

## 2018-06-26 DIAGNOSIS — J449 Chronic obstructive pulmonary disease, unspecified: Secondary | ICD-10-CM | POA: Diagnosis not present

## 2018-06-30 DIAGNOSIS — C3431 Malignant neoplasm of lower lobe, right bronchus or lung: Secondary | ICD-10-CM | POA: Diagnosis not present

## 2018-06-30 DIAGNOSIS — D638 Anemia in other chronic diseases classified elsewhere: Secondary | ICD-10-CM | POA: Diagnosis not present

## 2018-06-30 DIAGNOSIS — I10 Essential (primary) hypertension: Secondary | ICD-10-CM | POA: Diagnosis not present

## 2018-06-30 DIAGNOSIS — J449 Chronic obstructive pulmonary disease, unspecified: Secondary | ICD-10-CM | POA: Diagnosis not present

## 2018-07-03 ENCOUNTER — Ambulatory Visit (HOSPITAL_COMMUNITY)
Admission: RE | Admit: 2018-07-03 | Discharge: 2018-07-03 | Disposition: A | Discharge status: Home / Self Care | Payer: No Typology Code available for payment source | Source: Ambulatory Visit | Attending: Internal Medicine | Admitting: Internal Medicine

## 2018-07-03 DIAGNOSIS — J439 Emphysema, unspecified: Secondary | ICD-10-CM | POA: Diagnosis not present

## 2018-07-03 DIAGNOSIS — I2510 Atherosclerotic heart disease of native coronary artery without angina pectoris: Secondary | ICD-10-CM | POA: Insufficient documentation

## 2018-07-03 DIAGNOSIS — R918 Other nonspecific abnormal finding of lung field: Secondary | ICD-10-CM | POA: Diagnosis not present

## 2018-07-03 DIAGNOSIS — C3490 Malignant neoplasm of unspecified part of unspecified bronchus or lung: Secondary | ICD-10-CM

## 2018-07-03 DIAGNOSIS — I700 Atherosclerosis of aorta: Secondary | ICD-10-CM | POA: Diagnosis not present

## 2018-07-03 LAB — GLUCOSE, CAPILLARY: GLUCOSE-CAPILLARY: 87 mg/dL (ref 70–99)

## 2018-07-03 MED ORDER — FLUDEOXYGLUCOSE F - 18 (FDG) INJECTION
7.6000 | Freq: Once | INTRAVENOUS | Status: AC | PRN
  Administered 2018-07-03: 7.6 via INTRAVENOUS

## 2018-07-06 ENCOUNTER — Ambulatory Visit
Admission: RE | Admit: 2018-07-06 | Discharge: 2018-07-06 | Disposition: A | Discharge status: Home / Self Care | Payer: No Typology Code available for payment source | Source: Ambulatory Visit | Attending: Radiation Oncology | Admitting: Radiation Oncology

## 2018-07-06 ENCOUNTER — Other Ambulatory Visit: Payer: Self-pay

## 2018-07-06 ENCOUNTER — Telehealth: Payer: Self-pay | Admitting: *Deleted

## 2018-07-06 ENCOUNTER — Encounter: Payer: Self-pay | Admitting: Radiation Oncology

## 2018-07-06 VITALS — BP 117/46 | HR 94 | Temp 98.6°F | Resp 24 | Ht 65.0 in | Wt 144.8 lb

## 2018-07-06 DIAGNOSIS — Z881 Allergy status to other antibiotic agents status: Secondary | ICD-10-CM | POA: Diagnosis not present

## 2018-07-06 DIAGNOSIS — C3491 Malignant neoplasm of unspecified part of right bronchus or lung: Secondary | ICD-10-CM | POA: Diagnosis not present

## 2018-07-06 DIAGNOSIS — C3490 Malignant neoplasm of unspecified part of unspecified bronchus or lung: Secondary | ICD-10-CM | POA: Insufficient documentation

## 2018-07-06 DIAGNOSIS — Z79899 Other long term (current) drug therapy: Secondary | ICD-10-CM | POA: Insufficient documentation

## 2018-07-06 DIAGNOSIS — Z882 Allergy status to sulfonamides status: Secondary | ICD-10-CM | POA: Insufficient documentation

## 2018-07-06 NOTE — Telephone Encounter (Signed)
Oncology Nurse Navigator Documentation  Oncology Nurse Navigator Flowsheets 07/06/2018  Navigator Location [facility]  Navigator Encounter Type Telephone/per Dr. Julien Nordmann, I called and schedule her to be seen 07/10/18. She verbalized understanding of appt time and place.   Telephone Outgoing Call  Barriers/Navigation Needs Education;Coordination of Care  Education Other  Interventions Coordination of Care;Education  Coordination of Care Appts  Education Method Verbal  Acuity Level 2  Time Spent with Patient 30

## 2018-07-06 NOTE — Progress Notes (Signed)
Pt presents today for f/u with Dr. Sondra Come. Pt is on 4L [place] supplemental O2. Pt reports SOB "no more than usual". Pt reports a cough, with "congealed orange" sputum. Pt reports hemoptysis has resolved. Pt denies difficulty swallowing. Pt denies having lump in chest or any pain in chest. Pt's husband upset over concern that happened yesterday at SNF in which O2 sensor wouldn't read. During incident, pt was able to speak.   BP (!) 117/46 (BP Location: Right Arm, Patient Position: Sitting)   Pulse 94   Temp 98.6 F (37 C) (Oral)   Resp (!) 24   Ht 5\' 5"  (1.651 m)   Wt 144 lb 12.8 oz (65.7 kg)   SpO2 95%   PF (!) 4 L/min   BMI 24.10 kg/m   Wt Readings from Last 3 Encounters:  07/06/18 144 lb 12.8 oz (65.7 kg)  06/19/18 150 lb 5.7 oz (68.2 kg)  06/05/18 156 lb 11.2 oz (71.1 kg)   Loma Sousa, RN BSN

## 2018-07-06 NOTE — Progress Notes (Signed)
Radiation Oncology         ([phone] ________________________________  Name: Casey Wilkinson MRN: [id number]  Date: 07/06/2018  DOB: 1941/04/01  Follow-Up Visit Note  CC: Shon Baton, MD  Volanda Napoleon, MD    ICD-10-CM   1. 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  She is currently residing in nursing home. She is comfortable with her breathing 4 L. She denies any pain in the chest area. She denies any swallowing difficulties or significant cough or hemoptysis. Patient recently completed a PET scan which confirmed stage IV disease with adrenal metastasis                              ALLERGIES:  is allergic to prochlorperazine edisylate; sulfamethoxazole; and doxycycline.  Meds: Current Outpatient Medications  Medication Sig Dispense Refill  . acetaminophen (TYLENOL) 325 MG tablet Take 1,300 mg by mouth every 8 (eight) hours as needed for mild pain.    Marland Kitchen albuterol (PROAIR HFA) 108 (90 Base) MCG/ACT inhaler Inhale 2 puffs into the lungs every 4 (four) hours as needed for wheezing or shortness of breath. 3 Inhaler 1  . atorvastatin (LIPITOR) 10 MG tablet Take 10 mg by mouth daily.    . Calcium Carbonate-Vitamin D 600-400 MG-UNIT per tablet Take 1 tablet by mouth every morning.     . Cyanocobalamin (B-12) 2500 MCG TABS Take 2,500 mcg by mouth daily.    Marland Kitchen docusate sodium (COLACE) 100 MG capsule Take 100 mg by mouth at bedtime.     . famotidine (PEPCID) 20 MG tablet Take 20 mg by mouth 2 (two) times daily.    . feeding supplement, ENSURE ENLIVE, (ENSURE ENLIVE) LIQD Take 237 mLs by mouth 3 (three) times daily between meals. 151 mL 12  . folic acid (FOLVITE) 1 MG tablet  Take 1 mg by mouth every morning.     Marland Kitchen guaiFENesin (MUCINEX) 600 MG 12 hr tablet Take 600 mg by mouth 2 (two) times daily as needed for cough.    Marland Kitchen HYDROcodone-homatropine (HYCODAN) 5-1.5 MG/5ML syrup Take 5 mLs by mouth at bedtime and may repeat dose one time if needed. 120 mL 0  . ipratropium-albuterol (DUONEB) 0.5-2.5 (3) MG/3ML SOLN Take 3 mLs by nebulization every 4 (four) hours as needed (shortness of breath/wheezing). 360 mL 0  . iron polysaccharides (NIFEREX) 150 MG capsule Take 150 mg by mouth daily.    Marland Kitchen lidocaine (XYLOCAINE) 2 % solution caregiver: Mix 1part 2% viscous lidocaine, 1part H20. Swallow 57mL of diluted mixture, 67min before meals and at bedtime, up to QID 150 mL 2  . loratadine (CLARITIN) 10 MG tablet Take 10 mg by mouth daily.    . metoprolol tartrate (LOPRESSOR) 25 MG tablet Take 1 tablet (25 mg total) by mouth 2 (two) times daily. 60 tablet 0  . mirtazapine (REMERON) 7.5 MG tablet Take 1 tablet (7.5 mg total) by mouth at bedtime. 10 tablet 0  . Multiple Vitamin (MULTIVITAMIN WITH MINERALS) TABS tablet Take 1 tablet by mouth daily.    . polyethylene glycol (MIRALAX / GLYCOLAX) packet Take  17 g by mouth daily as needed for mild constipation.     . Potassium 99 MG TABS Take 99 mg by mouth daily.    . traMADol (ULTRAM) 50 MG tablet Take 1 tablet (50 mg total) by mouth every 6 (six) hours as needed for moderate pain. 14 tablet 0  . TRELEGY ELLIPTA 100-62.5-25 MCG/INH AEPB USE 1 INHALATION DAILY (Patient taking differently: Inhale 1 puff into the lungs daily. ) 180 each 1  . valsartan-hydrochlorothiazide (DIOVAN-HCT) 160-12.5 MG per tablet Take 1 tablet by mouth every morning.      No current facility-administered medications for this encounter.     Physical Findings: The patient is in no acute distress. Patient is alert and oriented.  height is 5\' 5"  (1.651 m) and weight is 144 lb 12.8 oz (65.7 kg). Her oral temperature is 98.6 F (37 C). Her blood pressure is 117/46  (abnormal) and her pulse is 94. Her respiration is 24 (abnormal) and oxygen saturation is 95%. . Patient is sitting comfortably in wheelchair with oxygen by nasal cannula at 4 L. He is accompanied by family members today. Examination of lungs reveals decreased breath sounds in the right lower lung field. The heart has a regular rhythm and rate  Lab Findings: Lab Results  Component Value Date   WBC 9.0 06/19/2018   HGB 9.3 (L) 06/19/2018   HCT 29.5 (L) 06/19/2018   MCV 91.6 06/19/2018   PLT 253 06/19/2018    Radiographic Findings: Dg Chest 1 View  Result Date: 06/10/2018 CLINICAL DATA:  Status post right thoracentesis today. EXAM: CHEST  1 VIEW COMPARISON:  Single-view of the chest 05/15/2018. CT chest 05/12/2018. FINDINGS: Right pleural effusion appears decreased compared to the prior plain film. No pneumothorax is identified. There is no left effusion. Right basilar airspace disease is again seen. The lungs are emphysematous. Heart size is normal. Aortic atherosclerosis is noted. IMPRESSION: Decreased right pleural effusion after thoracentesis. Negative for pneumothorax. Right basilar airspace disease most compatible with atelectasis. Emphysema. Atherosclerosis. Electronically Signed   By: Inge Rise M.D.   On: 06/10/2018 10:31   Dg Chest 2 View  Result Date: 06/19/2018 CLINICAL DATA:  77 year old female with a history of fever EXAM: CHEST - 2 VIEW COMPARISON:  06/17/2018, 06/14/2018 FINDINGS: Cardiomediastinal silhouette unchanged in size and contour. Improving airspace opacity at the right lung base, with blunting of the bilateral costophrenic angles persisting. Coarsened interstitial markings bilaterally. Meniscus on the lateral view IMPRESSION: Improving airspace opacities, with persisting small pleural effusions. Electronically Signed   By: Corrie Mckusick D.O.   On: 06/19/2018 08:49   Dg Chest 2 View  Result Date: 06/17/2018 CLINICAL DATA:  Shortness of breath.  History of colon  cancer, COPD. EXAM: CHEST - 2 VIEW COMPARISON:  Chest radiograph June 14, 2018 and CT chest June 14, 2018 FINDINGS: Persistent consolidation RIGHT lower lobe. Chronic interstitial changes and increased lung volumes with flattened hemidiaphragms. Small RIGHT pleural effusion. Small nodular densities unchanged. No pneumothorax. Cardiac silhouette is mildly enlarged. Calcified aortic arch. Soft tissue planes and included osseous structures are unchanged. IMPRESSION: 1. Stable appearance of RIGHT lower lobe consolidation/pneumonia. 2. Mild cardiomegaly. 3.  Aortic Atherosclerosis (ICD10-I70.0). 4.  Emphysema (ICD10-J43.9). Electronically Signed   By: Elon Alas M.D.   On: 06/17/2018 13:44   Dg Chest 2 View  Result Date: 06/14/2018 CLINICAL DATA:  Hypoxia, shortness of breath, lung cancer EXAM: CHEST - 2 VIEW COMPARISON:  06/10/2018 FINDINGS: Masslike right lower lobe opacity, possibly reflecting combination of  tumor and/or superimposed infection, progressed. Suspected small right pleural effusion. Left lung is clear. No pneumothorax. The heart is normal in size. IMPRESSION: Progressive masslike right lower lobe opacity, possibly reflecting combination of tumor and/or superimposed infection, with associated small right pleural effusion. Electronically Signed   By: Julian Hy M.D.   On: 06/14/2018 10:27   Ct Chest W Contrast  Result Date: 06/14/2018 CLINICAL DATA:  Productive cough. Hemoptysis and wheezing. Progressive right lower lobe opacity on chest radiography. EXAM: CT CHEST WITH CONTRAST TECHNIQUE: Multidetector CT imaging of the chest was performed during intravenous contrast administration. CONTRAST:  17mL OMNIPAQUE IOHEXOL 300 MG/ML  SOLN COMPARISON:  05/12/2017 FINDINGS: Cardiovascular: Coronary, aortic arch, and branch vessel atherosclerotic vascular disease. Mediastinum/Nodes: Scattered small mediastinal lymph nodes including an 8 mm paratracheal node on image 67/2 which is stable,  and a 9 mm right hilar lymph node. Currently no overtly pathologic adenopathy. Lungs/Pleura: There is consolidation of much of the right lung with some sparing of the superior segment. Air bronchograms within the consolidated portion. No central obstructing bronchial lesion. However, within the consolidated portion, there several hypodense areas including a 2.0 by 2.0 peripheral hypodensity on image 117/2 and a 2.9 by 1.4 cm posterior hypodensity on image 119/2. Neither of these 2 hypodense areas was readily apparent on 05/12/2018. Several other vague hypodense areas are noted within the right lower lobe consolidation. Severe centrilobular emphysema. 0.8 by 0.5 cm left upper lobe nodule on image 101/7, stable. 0.7 by 0.5 cm left lower lobe nodule on image 106/7, stable. There is atelectasis medially in the left lower lobe. Upper Abdomen: Hypodense triangular regions in the spleen are probably from splenic infarcts particularly in light of the faint perisplenic edema, and less likely to be due to early vascular phase. Advanced atherosclerosis of the splenic artery noted. Advanced abdominal aortic atherosclerosis noted with extensive plaque formation intruding on the lumen just below the level of the renal arteries. Stable nodularity of the left adrenal gland. Musculoskeletal: Thoracic spondylosis. IMPRESSION: 1. Continued dense consolidation in the right lower lobe with sparing of the superior segment. There are several new hypodense regions within the consolidated right lower lobe which could be from abscess, pulmonary infarct, or tumor. The consolidation could be from radiation pneumonitis, pneumonia, or less likely bronchopulmonary spread of tumor. There is no central truncation of the tracheobronchial tree. Borderline adenopathy in the chest. 2. 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 Origin uncertain but there is advanced atherosclerosis of the splenic artery and abdominal aorta. 3. Stable nodularity of the left adrenal  gland. 4.  Aortic Atherosclerosis (ICD10-I70.0).  Coronary atherosclerosis. 5. Emphysema (ICD10-J43.9). 6. There are 2 stable left lung pulmonary nodules which warrants surveillance. Electronically Signed   By: Van Clines M.D.   On: 06/14/2018 13:16   Nm Pet Image Initial (pi) Skull Base To Thigh  Result Date: 07/03/2018 CLINICAL DATA:  Initial treatment strategy for lung cancer. EXAM: NUCLEAR MEDICINE PET SKULL BASE TO THIGH TECHNIQUE: 7.6 mCi F-18 FDG was injected intravenously. Full-ring PET imaging was performed from the skull base to thigh after the radiotracer. CT data was obtained and used for attenuation correction and anatomic localization. Fasting blood glucose: 87 mg/dl COMPARISON:  06/14/2018 FINDINGS: Mediastinal blood pool activity: SUV max 2.8 NECK: No hypermetabolic lymph nodes in the neck. Incidental CT findings: none CHEST: There is mild FDG uptake within left paratracheal lymph nodes have an SUV max of 5.55. Mild uptake within low right paratracheal node has an SUV max of 3.83. Right lower  lobe mass like architectural distortion is intensely hypermetabolic. This measures 7.5 x 9.0 by 6.3 cm and is compatible with are hypermetabolic pleural thickening extends along the posterior aspect of the superior segment of right lower lobe suspicious for pleural spread of tumor. Medium size focus of increased uptake within the right middle lobe corresponds to an area of ground-glass attenuation measuring 1.9 cm. SUV max is equal to 6.19. Hypermetabolic nodule within the central right upper lobe measures 7 mm and has an SUV max of 5.25. Within the perihilar aspect of the posterior left upper lobe there is a focal nodule measuring approximately 5 mm within SUV max of 5.03. Incidental CT findings: Advanced changes of emphysema identified. Aortic atherosclerosis. The calcification in the LAD, left circumflex and RCA coronary artery noted. ABDOMEN/PELVIS: No abnormal radiotracer uptake identified  within the liver, pancreas, or spleen. Normal appearance of the right adrenal gland. Mild asymmetric increased uptake within the left adrenal gland has an SUV max of 4.85 and corresponds with 1.5 cm solid nodule in the left adrenal gland, image 105/5. No hypermetabolic retroperitoneal lymph nodes. No hypermetabolic mesenteric, or pelvic lymph nodes. There is a large focus of intense radiotracer uptake which localizes to an area of the distal rectum and anus. The SUV max is equal to 19.13. Incidental CT findings: Aortic atherosclerosis.  No aneurysm. SKELETON: There is area of increased radiotracer uptake within the lower thoracic spine canal measuring approximately 2.6 cm within SUV max of 4.75. Incidental CT findings: none IMPRESSION: 1. Right lower lobe masslike area of architectural distortion and airspace consolidation is intensely hypermetabolic compatible with primary bronchogenic carcinoma. Adjacent area of hypermetabolic pleural thickening along the superior segment of right lower lobe may reflect trans pleural spread of disease. 2. Several nonspecific appearing pulmonary nodules are noted in both lungs which exhibit moderate increased radiotracer uptake. Cannot rule out metastatic disease. 3. Mild to moderate increased uptake is associated with right paratracheal and left paratracheal lymph nodes. Cannot rule out metastatic adenopathy. 4. Left adrenal nodule exhibits increased radiotracer uptake suspicious for metastatic disease. 5. Focal area of increased uptake within the lower thoracic spinal canal is identified. If there are clinical signs or symptoms of possible canal CNS involvement by tumor consider further evaluation with MRI of the thoracic spine. 6. Large focus of intense radiotracer uptake localizing to the distal rectum and anus. Findings suspicious for anal neoplasm. Correlation with direct visualization is advised. 7. Aortic Atherosclerosis (ICD10-I70.0) and Emphysema (ICD10-J43.9). 8. Multi  vessel coronary artery atherosclerotic calcifications. Electronically Signed   By: Kerby Moors M.D.   On: 07/03/2018 16:09   US Thoracentesis Asp Pleural Space W/img Guide  Result Date: 06/10/2018 INDICATION: Patient with prior history of colon cancer and now with newly diagnosed lung cancer, dyspnea, right pleural effusion. Request made for diagnostic and therapeutic right thoracentesis. EXAM: ULTRASOUND GUIDED DIAGNOSTIC AND THERAPEUTIC RIGHT THORACENTESIS MEDICATIONS: None COMPLICATIONS: None immediate. PROCEDURE: An ultrasound guided thoracentesis was thoroughly discussed with the patient and questions answered. The benefits, risks, alternatives and complications were also discussed. The patient understands and wishes to proceed with the procedure. Written consent was obtained. Ultrasound was performed to localize and mark an adequate pocket of fluid in the right chest. The area was then prepped and draped in the normal sterile fashion. 1% Lidocaine was used for local anesthesia. Under ultrasound guidance a 6 Fr Safe-T-Centesis catheter was introduced. Thoracentesis was performed. The catheter was removed and a dressing applied. FINDINGS: A total of approximately 50 cc of yellow  fluid was removed. Samples were sent to the laboratory as requested by the clinical team. The right pleural effusion was very small. No significant left pleural effusion noted. IMPRESSION: Successful ultrasound guided diagnostic and therapeutic right thoracentesis yielding 50 cc of pleural fluid. Read by: Rowe Robert, PA-C Electronically Signed   By: Jerilynn Mages.  Shick M.D.   On: 06/10/2018 10:15    Impression:  Stage IV adenocarcinoma of lung. The patient recently completed palliative radiation therapy directed at her right lung. Her breathing seems to be stable at this time.  Plan:  When necessary follow-up in radiation oncology. The patient will meet with Dr. Julien Nordmann later this week to determine if she is a candidate for systemic  therapy.  ____________________________________ Gery Pray, MD

## 2018-07-07 ENCOUNTER — Ambulatory Visit: Payer: Self-pay | Admitting: *Deleted

## 2018-07-07 DIAGNOSIS — I10 Essential (primary) hypertension: Secondary | ICD-10-CM | POA: Diagnosis not present

## 2018-07-07 DIAGNOSIS — C3431 Malignant neoplasm of lower lobe, right bronchus or lung: Secondary | ICD-10-CM | POA: Diagnosis not present

## 2018-07-07 DIAGNOSIS — D638 Anemia in other chronic diseases classified elsewhere: Secondary | ICD-10-CM | POA: Diagnosis not present

## 2018-07-07 DIAGNOSIS — J449 Chronic obstructive pulmonary disease, unspecified: Secondary | ICD-10-CM | POA: Diagnosis not present

## 2018-07-08 ENCOUNTER — Other Ambulatory Visit: Payer: Self-pay | Admitting: *Deleted

## 2018-07-08 ENCOUNTER — Encounter: Payer: Self-pay | Admitting: Nurse Practitioner

## 2018-07-08 ENCOUNTER — Ambulatory Visit (INDEPENDENT_AMBULATORY_CARE_PROVIDER_SITE_OTHER): Payer: Medicare Other | Admitting: Nurse Practitioner

## 2018-07-08 DIAGNOSIS — J9621 Acute and chronic respiratory failure with hypoxia: Secondary | ICD-10-CM

## 2018-07-08 DIAGNOSIS — C3491 Malignant neoplasm of unspecified part of right bronchus or lung: Secondary | ICD-10-CM | POA: Diagnosis not present

## 2018-07-08 DIAGNOSIS — J449 Chronic obstructive pulmonary disease, unspecified: Secondary | ICD-10-CM | POA: Diagnosis not present

## 2018-07-08 NOTE — Patient Instructions (Addendum)
Continue trelegy, proventil, and duonebs Continue O2 at 4L [place] Continue physical therapy at SNF Follow up with oncology on 07-10-18 to discuss PET scan Follow up with Dr. Lamonte Sakai in 1 month or sooner if needed

## 2018-07-08 NOTE — Progress Notes (Signed)
@Patient  ID: Casey Wilkinson, female    DOB: Apr 18, 1941, 77 y.o.   MRN: [id number]  Chief Complaint  Patient presents with  . Hospitalization Follow-up    Referring provider: Shon Baton, MD  HPI 77 year old female with COPD on 2L oxygen followed by Dr. Lamonte Sakai.  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  Tests:  PET scan 07/03/18:1. Right lower lobe masslike area of architectural distortion and airspace consolidation is intensely hypermetabolic compatible with primary bronchogenic carcinoma. Adjacent area of hypermetabolic pleural thickening along the superior segment of right lower lobe may reflect trans pleural spread of disease. 2. Several nonspecific appearing pulmonary nodules are noted in both lungs which exhibit moderate increased radiotracer uptake. Cannot rule out metastatic disease. 3. Mild to moderate increased uptake is associated with right paratracheal and left paratracheal lymph nodes. Cannot rule out metastatic adenopathy. 4. Left adrenal nodule exhibits increased radiotracer uptake suspicious for metastatic disease. 5. Focal area of increased uptake within the lower thoracic spinal canal is identified. If there are clinical signs or symptoms of possible canal CNS involvement by tumor consider further evaluation with MRI of the thoracic spine. 6. Large focus of intense radiotracer uptake localizing to the distal rectum and anus. Findings suspicious for anal neoplasm. Correlation with direct visualization is advised. 7. Aortic Atherosclerosis (ICD10-I70.0) and Emphysema (ICD10-J43.9). 8. Multi vessel coronary artery atherosclerotic calcifications. Electronically Signed   By: Kerby Moors M.D.   On: 07/03/2018 16:09  chest xray 06/19/18: Improving airspace opacities, with persisting small pleural  effusions. CT chest 04/22/18-  No substantial interval change in the dense right lower lobe consolidative disease. CT chest 03/17/18- Right lower lobe consolidation consistent with pneumonia. A rounded region of lower attenuation containing abnormal air is most consistent with a developing lung abscess. Spirometry 11/04/17 - severe obstructive airways disease with restricted exhaled volume.  FVC 1.56/63%, FEV1 0.71/38%, ratio 0.46, FEF 25-75% 0.25/17%.  Recent significant events: Hospital summary - admitted 06/14/18 - 06/19/18 Brief/Interim Summary:77 year old female with history of right lung stage IIIa/IV adenocarcinoma status post radiation, colon cancer in remission, squamous cell cancer of leg, COPD on 2 L oxygen, hypertension, discoid lupus, recent admission from 05/12/2018-05/23/2018 for postobstructive pneumonia, recent thoracentesis on 06/10/2018 and 50 cc were removed presented from nursing home for hypoxia and shortness of breath. Patient was admitted with acute on chronic hypoxic respiratory failure with probable COPD flare superimposed on adenocarcinoma of the lung with extensive right lower lobe consolidation. She was started on steroids and broad-spectrum antibiotics. Pulmonary was consulted. Antibiotics were discontinued.  Patient developed tachycardia, metoprolol was started.  Cardiology evaluated patient and recommended no further intervention.  Palliative care has also evaluated the patient.  Patient will be discharged back to the nursing home.  OV 07/08/18 - Hospital follow-up  Patient presents for a hospital follow up. She has been in SNF since discharge, She has been participating in physical therapy and has regained some of her strength. She has had her PET scan and has a follow up appointment scheduled this Friday (07-10-18) to discuss the results with oncology. Hospital report noted that depending on PET scan results - hospice might need to be consulted. She was on 5L [place] at hospital D/C,  but with therapy she has been able to go down to 4L [place] now. She has been walking with assistance. States that her appetite  is still poor. She is compliant with medications. She denies any shortness of breath, fever, chest pain, or edema.     Allergies  Allergen Reactions  . Prochlorperazine Edisylate Nausea Only and Other (See Comments)    **COMPAZINE**   Stroke-like symptoms  . Sulfamethoxazole Nausea Only and Other (See Comments)    Pt taking Methotrexate, Sulfur drugs could cause SEVERE fatal reaction.  . Doxycycline Hives    Immunization History  Administered Date(s) Administered  . Influenza Split 07/21/2012, 07/21/2017  . Influenza Whole 07/05/2009, 1September 11, 202011, 07/06/2011  . Influenza,inj,Quad PF,6+ Mos 07/13/2013, 07/14/2014, 06/26/2015, 07/16/2016  . Pneumococcal Polysaccharide-23 04/19/2005, 1September 11, 202011  . Pneumococcal-Unspecified 07/08/2014    Past Medical History:  Diagnosis Date  . Anemia   . Arthritis    "all over"  . Carotid artery stenosis    right side followed by [facility]- 65%  . Colitis April 2016  . Colon cancer ([place])   . COPD (chronic obstructive pulmonary disease) ([place])   . Discoid lupus erythematosus   . Diverticulitis April 2016   and Colitis  . History of blood transfusion 01/2015, 06/2015   colectomy  . Hypertension   . Psoriatic arthritis ([place])   . Shortness of breath dyspnea    with exertion  . Squamous cell carcinoma, leg     Tobacco History: Social History   Tobacco Use  Smoking Status Former Smoker  . Packs/day: 0.50  . Years: 50.00  . Pack years: 25.00  . Types: Cigarettes  . Last attempt to quit: 12/05/2000  . Years since quitting: 17.6  Smokeless Tobacco Never Used  Tobacco Comment   started smoking in early 20s   Counseling given: Yes Comment: started smoking in early 20s   Outpatient Encounter Medications as of 07/08/2018  Medication Sig  . acetaminophen (TYLENOL) 325 MG tablet Take 1,300 mg by mouth every 8 (eight) hours as  needed for mild pain.  Marland Kitchen albuterol (PROAIR HFA) 108 (90 Base) MCG/ACT inhaler Inhale 2 puffs into the lungs every 4 (four) hours as needed for wheezing or shortness of breath.  Marland Kitchen atorvastatin (LIPITOR) 10 MG tablet Take 10 mg by mouth daily.  . Calcium Carbonate-Vitamin D 600-400 MG-UNIT per tablet Take 1 tablet by mouth every morning.   . Cyanocobalamin (B-12) 2500 MCG TABS Take 2,500 mcg by mouth daily.  Marland Kitchen docusate sodium (COLACE) 100 MG capsule Take 100 mg by mouth at bedtime.   . famotidine (PEPCID) 20 MG tablet Take 20 mg by mouth 2 (two) times daily.  . feeding supplement, ENSURE ENLIVE, (ENSURE ENLIVE) LIQD Take 237 mLs by mouth 3 (three) times daily between meals.  . folic acid (FOLVITE) 1 MG tablet Take 1 mg by mouth every morning.   Marland Kitchen guaiFENesin (MUCINEX) 600 MG 12 hr tablet Take 600 mg by mouth 2 (two) times daily as needed for cough.  Marland Kitchen HYDROcodone-homatropine (HYCODAN) 5-1.5 MG/5ML syrup Take 5 mLs by mouth at bedtime and may repeat dose one time if needed.  . hydrocortisone (ANUSOL-HC) 2.5 % rectal cream Place 1 application rectally 2 (two) times daily. Apply to rectum every 12 hours as needed for hemorrhoids  . ipratropium-albuterol (DUONEB) 0.5-2.5 (3) MG/3ML SOLN Take 3 mLs by nebulization every 4 (four) hours as needed (shortness of breath/wheezing).  . iron polysaccharides (NIFEREX) 150 MG capsule Take 150 mg by mouth daily.  Marland Kitchen lidocaine (XYLOCAINE) 2 % solution caregiver: Mix 1part 2% viscous lidocaine, 1part H20. Swallow 17mL of diluted mixture, 76min before meals and at bedtime, up to QID  .  loratadine (CLARITIN) 10 MG tablet Take 10 mg by mouth daily.  . metoprolol tartrate (LOPRESSOR) 25 MG tablet Take 1 tablet (25 mg total) by mouth 2 (two) times daily.  . mirtazapine (REMERON) 7.5 MG tablet Take 1 tablet (7.5 mg total) by mouth at bedtime.  . Multiple Vitamin (MULTIVITAMIN WITH MINERALS) TABS tablet Take 1 tablet by mouth daily.  . ondansetron (ZOFRAN) 4 MG tablet Take 4  mg by mouth every 6 (six) hours as needed for nausea or vomiting.  . polyethylene glycol (MIRALAX / GLYCOLAX) packet Take 17 g by mouth daily as needed for mild constipation.   . Potassium 99 MG TABS Take 99 mg by mouth daily.  . traMADol (ULTRAM) 50 MG tablet Take 1 tablet (50 mg total) by mouth every 6 (six) hours as needed for moderate pain.  . TRELEGY ELLIPTA 100-62.5-25 MCG/INH AEPB USE 1 INHALATION DAILY (Patient taking differently: Inhale 1 puff into the lungs daily. )  . valsartan-hydrochlorothiazide (DIOVAN-HCT) 160-12.5 MG per tablet Take 1 tablet by mouth every morning.    No facility-administered encounter medications on file as of 07/08/2018.      Review of Systems  Review of Systems  Constitutional: Negative.  Negative for chills and fever.  HENT: Negative.  Negative for congestion.   Respiratory: Negative for cough, shortness of breath and wheezing.   Cardiovascular: Negative.  Negative for chest pain, palpitations and leg swelling.  Gastrointestinal: Negative.   Allergic/Immunologic: Negative.   Neurological: Negative.   Psychiatric/Behavioral: Negative.        Physical Exam  BP 128/74 (BP Location: Right Arm, Patient Position: Sitting, Cuff Size: Normal)   Pulse 84   Temp (!) 97.3 F (36.3 C)   Ht 5\' 5"  (1.651 m)   Wt 144 lb 12.8 oz (65.7 kg)   SpO2 95%   BMI 24.10 kg/m   Wt Readings from Last 5 Encounters:  07/08/18 144 lb 12.8 oz (65.7 kg)  07/06/18 144 lb 12.8 oz (65.7 kg)  06/19/18 150 lb 5.7 oz (68.2 kg)  06/05/18 156 lb 11.2 oz (71.1 kg)  05/12/18 152 lb (68.9 kg)     Physical Exam  Constitutional: She is oriented to person, place, and time. She appears well-developed and well-nourished. No distress.  Cardiovascular: Normal rate and regular rhythm.  Pulmonary/Chest: Effort normal and breath sounds normal. No respiratory distress. She has no wheezes.  Musculoskeletal: She exhibits no edema.  Neurological: She is alert and oriented to person,  place, and time.  Psychiatric: She has a normal mood and affect.  Nursing note and vitals reviewed.    Imaging: Dg Chest 1 View  Result Date: 06/10/2018 CLINICAL DATA:  Status post right thoracentesis today. EXAM: CHEST  1 VIEW COMPARISON:  Single-view of the chest 05/15/2018. CT chest 05/12/2018. FINDINGS: Right pleural effusion appears decreased compared to the prior plain film. No pneumothorax is identified. There is no left effusion. Right basilar airspace disease is again seen. The lungs are emphysematous. Heart size is normal. Aortic atherosclerosis is noted. IMPRESSION: Decreased right pleural effusion after thoracentesis. Negative for pneumothorax. Right basilar airspace disease most compatible with atelectasis. Emphysema. Atherosclerosis. Electronically Signed   By: Inge Rise M.D.   On: 06/10/2018 10:31   Dg Chest 2 View  Result Date: 06/19/2018 CLINICAL DATA:  77 year old female with a history of fever EXAM: CHEST - 2 VIEW COMPARISON:  06/17/2018, 06/14/2018 FINDINGS: Cardiomediastinal silhouette unchanged in size and contour. Improving airspace opacity at the right lung base, with blunting of the  bilateral costophrenic angles persisting. Coarsened interstitial markings bilaterally. Meniscus on the lateral view IMPRESSION: Improving airspace opacities, with persisting small pleural effusions. Electronically Signed   By: Corrie Mckusick D.O.   On: 06/19/2018 08:49   Dg Chest 2 View  Result Date: 06/17/2018 CLINICAL DATA:  Shortness of breath.  History of colon cancer, COPD. EXAM: CHEST - 2 VIEW COMPARISON:  Chest radiograph June 14, 2018 and CT chest June 14, 2018 FINDINGS: Persistent consolidation RIGHT lower lobe. Chronic interstitial changes and increased lung volumes with flattened hemidiaphragms. Small RIGHT pleural effusion. Small nodular densities unchanged. No pneumothorax. Cardiac silhouette is mildly enlarged. Calcified aortic arch. Soft tissue planes and included osseous  structures are unchanged. IMPRESSION: 1. Stable appearance of RIGHT lower lobe consolidation/pneumonia. 2. Mild cardiomegaly. 3.  Aortic Atherosclerosis (ICD10-I70.0). 4.  Emphysema (ICD10-J43.9). Electronically Signed   By: Elon Alas M.D.   On: 06/17/2018 13:44   Dg Chest 2 View  Result Date: 06/14/2018 CLINICAL DATA:  Hypoxia, shortness of breath, lung cancer EXAM: CHEST - 2 VIEW COMPARISON:  06/10/2018 FINDINGS: Masslike right lower lobe opacity, possibly reflecting combination of tumor and/or superimposed infection, progressed. Suspected small right pleural effusion. Left lung is clear. No pneumothorax. The heart is normal in size. IMPRESSION: Progressive masslike right lower lobe opacity, possibly reflecting combination of tumor and/or superimposed infection, with associated small right pleural effusion. Electronically Signed   By: Julian Hy M.D.   On: 06/14/2018 10:27   Ct Chest W Contrast  Result Date: 06/14/2018 CLINICAL DATA:  Productive cough. Hemoptysis and wheezing. Progressive right lower lobe opacity on chest radiography. EXAM: CT CHEST WITH CONTRAST TECHNIQUE: Multidetector CT imaging of the chest was performed during intravenous contrast administration. CONTRAST:  23mL OMNIPAQUE IOHEXOL 300 MG/ML  SOLN COMPARISON:  05/12/2017 FINDINGS: Cardiovascular: Coronary, aortic arch, and branch vessel atherosclerotic vascular disease. Mediastinum/Nodes: Scattered small mediastinal lymph nodes including an 8 mm paratracheal node on image 67/2 which is stable, and a 9 mm right hilar lymph node. Currently no overtly pathologic adenopathy. Lungs/Pleura: There is consolidation of much of the right lung with some sparing of the superior segment. Air bronchograms within the consolidated portion. No central obstructing bronchial lesion. However, within the consolidated portion, there several hypodense areas including a 2.0 by 2.0 peripheral hypodensity on image 117/2 and a 2.9 by 1.4 cm  posterior hypodensity on image 119/2. Neither of these 2 hypodense areas was readily apparent on 05/12/2018. Several other vague hypodense areas are noted within the right lower lobe consolidation. Severe centrilobular emphysema. 0.8 by 0.5 cm left upper lobe nodule on image 101/7, stable. 0.7 by 0.5 cm left lower lobe nodule on image 106/7, stable. There is atelectasis medially in the left lower lobe. Upper Abdomen: Hypodense triangular regions in the spleen are probably from splenic infarcts particularly in light of the faint perisplenic edema, and less likely to be due to early vascular phase. Advanced atherosclerosis of the splenic artery noted. Advanced abdominal aortic atherosclerosis noted with extensive plaque formation intruding on the lumen just below the level of the renal arteries. Stable nodularity of the left adrenal gland. Musculoskeletal: Thoracic spondylosis. IMPRESSION: 1. Continued dense consolidation in the right lower lobe with sparing of the superior segment. There are several new hypodense regions within the consolidated right lower lobe which could be from abscess, pulmonary infarct, or tumor. The consolidation could be from radiation pneumonitis, pneumonia, or less likely bronchopulmonary spread of tumor. There is no central truncation of the tracheobronchial tree. Borderline adenopathy in the  chest. 2. 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 Origin uncertain but there is advanced atherosclerosis of the splenic artery and abdominal aorta. 3. Stable nodularity of the left adrenal gland. 4.  Aortic Atherosclerosis (ICD10-I70.0).  Coronary atherosclerosis. 5. Emphysema (ICD10-J43.9). 6. There are 2 stable left lung pulmonary nodules which warrants surveillance. Electronically Signed   By: Van Clines M.D.   On: 06/14/2018 13:16   Nm Pet Image Initial (pi) Skull Base To Thigh  Result Date: 07/03/2018 CLINICAL DATA:  Initial treatment strategy for lung cancer. EXAM: NUCLEAR MEDICINE PET SKULL  BASE TO THIGH TECHNIQUE: 7.6 mCi F-18 FDG was injected intravenously. Full-ring PET imaging was performed from the skull base to thigh after the radiotracer. CT data was obtained and used for attenuation correction and anatomic localization. Fasting blood glucose: 87 mg/dl COMPARISON:  06/14/2018 FINDINGS: Mediastinal blood pool activity: SUV max 2.8 NECK: No hypermetabolic lymph nodes in the neck. Incidental CT findings: none CHEST: There is mild FDG uptake within left paratracheal lymph nodes have an SUV max of 5.55. Mild uptake within low right paratracheal node has an SUV max of 3.83. Right lower lobe mass like architectural distortion is intensely hypermetabolic. This measures 7.5 x 9.0 by 6.3 cm and is compatible with are hypermetabolic pleural thickening extends along the posterior aspect of the superior segment of right lower lobe suspicious for pleural spread of tumor. Medium size focus of increased uptake within the right middle lobe corresponds to an area of ground-glass attenuation measuring 1.9 cm. SUV max is equal to 6.19. Hypermetabolic nodule within the central right upper lobe measures 7 mm and has an SUV max of 5.25. Within the perihilar aspect of the posterior left upper lobe there is a focal nodule measuring approximately 5 mm within SUV max of 5.03. Incidental CT findings: Advanced changes of emphysema identified. Aortic atherosclerosis. The calcification in the LAD, left circumflex and RCA coronary artery noted. ABDOMEN/PELVIS: No abnormal radiotracer uptake identified within the liver, pancreas, or spleen. Normal appearance of the right adrenal gland. Mild asymmetric increased uptake within the left adrenal gland has an SUV max of 4.85 and corresponds with 1.5 cm solid nodule in the left adrenal gland, image 105/5. No hypermetabolic retroperitoneal lymph nodes. No hypermetabolic mesenteric, or pelvic lymph nodes. There is a large focus of intense radiotracer uptake which localizes to an area  of the distal rectum and anus. The SUV max is equal to 19.13. Incidental CT findings: Aortic atherosclerosis.  No aneurysm. SKELETON: There is area of increased radiotracer uptake within the lower thoracic spine canal measuring approximately 2.6 cm within SUV max of 4.75. Incidental CT findings: none IMPRESSION: 1. Right lower lobe masslike area of architectural distortion and airspace consolidation is intensely hypermetabolic compatible with primary bronchogenic carcinoma. Adjacent area of hypermetabolic pleural thickening along the superior segment of right lower lobe may reflect trans pleural spread of disease. 2. Several nonspecific appearing pulmonary nodules are noted in both lungs which exhibit moderate increased radiotracer uptake. Cannot rule out metastatic disease. 3. Mild to moderate increased uptake is associated with right paratracheal and left paratracheal lymph nodes. Cannot rule out metastatic adenopathy. 4. Left adrenal nodule exhibits increased radiotracer uptake suspicious for metastatic disease. 5. Focal area of increased uptake within the lower thoracic spinal canal is identified. If there are clinical signs or symptoms of possible canal CNS involvement by tumor consider further evaluation with MRI of the thoracic spine. 6. Large focus of intense radiotracer uptake localizing to the distal rectum and anus. Findings suspicious for  anal neoplasm. Correlation with direct visualization is advised. 7. Aortic Atherosclerosis (ICD10-I70.0) and Emphysema (ICD10-J43.9). 8. Multi vessel coronary artery atherosclerotic calcifications. Electronically Signed   By: Kerby Moors M.D.   On: 07/03/2018 16:09   US Thoracentesis Asp Pleural Space W/img Guide  Result Date: 06/10/2018 INDICATION: Patient with prior history of colon cancer and now with newly diagnosed lung cancer, dyspnea, right pleural effusion. Request made for diagnostic and therapeutic right thoracentesis. EXAM: ULTRASOUND GUIDED DIAGNOSTIC  AND THERAPEUTIC RIGHT THORACENTESIS MEDICATIONS: None COMPLICATIONS: None immediate. PROCEDURE: An ultrasound guided thoracentesis was thoroughly discussed with the patient and questions answered. The benefits, risks, alternatives and complications were also discussed. The patient understands and wishes to proceed with the procedure. Written consent was obtained. Ultrasound was performed to localize and mark an adequate pocket of fluid in the right chest. The area was then prepped and draped in the normal sterile fashion. 1% Lidocaine was used for local anesthesia. Under ultrasound guidance a 6 Fr Safe-T-Centesis catheter was introduced. Thoracentesis was performed. The catheter was removed and a dressing applied. FINDINGS: A total of approximately 50 cc of yellow fluid was removed. Samples were sent to the laboratory as requested by the clinical team. The right pleural effusion was very small. No significant left pleural effusion noted. IMPRESSION: Successful ultrasound guided diagnostic and therapeutic right thoracentesis yielding 50 cc of pleural fluid. Read by: Rowe Robert, PA-C Electronically Signed   By: Jerilynn Mages.  Shick M.D.   On: 06/10/2018 10:15     Assessment & Plan:   Adenocarcinoma of right lung [facility]) Patient Instructions  Continue trelegy, proventil, and duonebs Continue O2 at 4L [place] Continue physical therapy at SNF Follow up with oncology on 07-10-18 to discuss PET scan Follow up with Dr. Lamonte Sakai in 1 month or sooner if needed      Acute on chronic respiratory failure with hypoxia ([place]) Continue 4L [place] - continue physical therapy  COPD (chronic obstructive pulmonary disease) ([place]) Patient Instructions  Continue trelegy, proventil, and duonebs Continue O2 at 4L [place] Continue physical therapy at SNF Follow up with oncology on 07-10-18 to discuss PET scan Follow up with Dr. Lamonte Sakai in 1 month or sooner if needed         Fenton Foy, NP 07/08/2018

## 2018-07-08 NOTE — Patient Outreach (Signed)
[facility]) Care Management  07/08/2018  Casey Wilkinson 01-May-1941 [id number]   CSW attempted to meet with patient at [facility], but patient was not in room during [place] visit. Per [place] - patient had a PET scan 9/13 and has an appointment this Friday with oncology. Depending on the outcome of the scan, patient may elect hospice. CSW will continue to follow.    [name], LCSW [facility]  Clinical Social Worker cell #: [phone]

## 2018-07-08 NOTE — Assessment & Plan Note (Signed)
Patient Instructions  Continue trelegy, proventil, and duonebs Continue O2 at 4L [place] Continue physical therapy at SNF Follow up with oncology on 07-10-18 to discuss PET scan Follow up with Dr. Lamonte Sakai in 1 month or sooner if needed

## 2018-07-08 NOTE — Assessment & Plan Note (Signed)
Patient Instructions  Continue trelegy, proventil, and duonebs Continue O2 at 4L [place] Continue physical therapy at SNF Follow up with oncology on 07-10-18 to discuss PET scan Follow up with Dr. Lamonte Sakai in 1 month or sooner if needed

## 2018-07-08 NOTE — Assessment & Plan Note (Signed)
Continue 4L [place] - continue physical therapy

## 2018-07-09 ENCOUNTER — Ambulatory Visit: Payer: Self-pay | Admitting: Radiation Oncology

## 2018-07-10 ENCOUNTER — Inpatient Hospital Stay: Payer: Medicare Other | Attending: Internal Medicine

## 2018-07-10 ENCOUNTER — Inpatient Hospital Stay (HOSPITAL_BASED_OUTPATIENT_CLINIC_OR_DEPARTMENT_OTHER): Payer: Medicare Other | Admitting: Internal Medicine

## 2018-07-10 ENCOUNTER — Telehealth: Payer: Self-pay | Admitting: Internal Medicine

## 2018-07-10 VITALS — BP 100/54 | HR 107 | Temp 97.8°F | Resp 17 | Ht 65.0 in | Wt 145.8 lb

## 2018-07-10 DIAGNOSIS — Z79899 Other long term (current) drug therapy: Secondary | ICD-10-CM | POA: Insufficient documentation

## 2018-07-10 DIAGNOSIS — I2510 Atherosclerotic heart disease of native coronary artery without angina pectoris: Secondary | ICD-10-CM | POA: Diagnosis not present

## 2018-07-10 DIAGNOSIS — J449 Chronic obstructive pulmonary disease, unspecified: Secondary | ICD-10-CM | POA: Insufficient documentation

## 2018-07-10 DIAGNOSIS — Z5111 Encounter for antineoplastic chemotherapy: Secondary | ICD-10-CM | POA: Diagnosis not present

## 2018-07-10 DIAGNOSIS — C3491 Malignant neoplasm of unspecified part of right bronchus or lung: Secondary | ICD-10-CM | POA: Insufficient documentation

## 2018-07-10 DIAGNOSIS — L930 Discoid lupus erythematosus: Secondary | ICD-10-CM | POA: Insufficient documentation

## 2018-07-10 DIAGNOSIS — I10 Essential (primary) hypertension: Secondary | ICD-10-CM | POA: Diagnosis not present

## 2018-07-10 DIAGNOSIS — Z85038 Personal history of other malignant neoplasm of large intestine: Secondary | ICD-10-CM

## 2018-07-10 DIAGNOSIS — R5382 Chronic fatigue, unspecified: Secondary | ICD-10-CM

## 2018-07-10 DIAGNOSIS — C3431 Malignant neoplasm of lower lobe, right bronchus or lung: Secondary | ICD-10-CM

## 2018-07-10 DIAGNOSIS — Z5112 Encounter for antineoplastic immunotherapy: Secondary | ICD-10-CM

## 2018-07-10 DIAGNOSIS — Z7189 Other specified counseling: Secondary | ICD-10-CM

## 2018-07-10 LAB — CBC WITH DIFFERENTIAL (CANCER CENTER ONLY)
BASOS PCT: 0 %
Basophils Absolute: 0 10*3/uL (ref 0.0–0.1)
EOS ABS: 0.3 10*3/uL (ref 0.0–0.5)
Eosinophils Relative: 3 %
HCT: 27.4 % — ABNORMAL LOW (ref 34.8–46.6)
Hemoglobin: 8.4 g/dL — ABNORMAL LOW (ref 11.6–15.9)
Lymphocytes Relative: 4 %
Lymphs Abs: 0.4 10*3/uL — ABNORMAL LOW (ref 0.9–3.3)
MCH: 27.6 pg (ref 25.1–34.0)
MCHC: 30.7 g/dL — AB (ref 31.5–36.0)
MCV: 90.1 fL (ref 79.5–101.0)
MONO ABS: 0.6 10*3/uL (ref 0.1–0.9)
MONOS PCT: 8 %
NEUTROS PCT: 85 %
Neutro Abs: 7.3 10*3/uL — ABNORMAL HIGH (ref 1.5–6.5)
Platelet Count: 240 10*3/uL (ref 145–400)
RBC: 3.04 MIL/uL — ABNORMAL LOW (ref 3.70–5.45)
RDW: 17.6 % — AB (ref 11.2–14.5)
WBC Count: 8.6 10*3/uL (ref 3.9–10.3)

## 2018-07-10 LAB — CMP (CANCER CENTER ONLY)
ALBUMIN: 2.1 g/dL — AB (ref 3.5–5.0)
ALT: 16 U/L (ref 0–44)
ANION GAP: 10 (ref 5–15)
AST: 29 U/L (ref 15–41)
Alkaline Phosphatase: 134 U/L — ABNORMAL HIGH (ref 38–126)
BUN: 21 mg/dL (ref 8–23)
CO2: 29 mmol/L (ref 22–32)
Calcium: 9.2 mg/dL (ref 8.9–10.3)
Chloride: 102 mmol/L (ref 98–111)
Creatinine: 0.86 mg/dL (ref 0.44–1.00)
GFR, Est AFR Am: >60 mL/min (ref >60)
GFR, Estimated: >60 mL/min (ref >60)
GLUCOSE: 116 mg/dL — AB (ref 70–99)
Potassium: 3.7 mmol/L (ref 3.5–5.1)
Sodium: 141 mmol/L (ref 135–145)
TOTAL PROTEIN: 6.9 g/dL (ref 6.5–8.1)
Total Bilirubin: 0.6 mg/dL (ref 0.3–1.2)

## 2018-07-10 NOTE — Progress Notes (Signed)
[place] Telephone:(336) [phone]   Fax:(336) [phone]  OFFICE PROGRESS NOTE  Shon Baton, MD [place] [address]  DIAGNOSIS: unresectable stage IIIA/IV non-small cell lung cancer, adenocarcinoma diagnosed in July 2019 presented with right lower lobe lung mass as well as right pleural effusion and a small left lower lobe lung nodules. Molecular studies showed positive RET rearrangement and PDL 1 expression of 5%.  PRIOR THERAPY: None  CURRENT THERAPY: First-line treatment with immunotherapy with Keytruda 200 mg IV every 3 weeks.  First dose 07/15/2018.  INTERVAL HISTORY: Casey Wilkinson 77 y.o. female returns to the clinic today for follow-up visit accompanied by several family members including her husband daughter and son-in-law.  The patient is feeling fine today except for the generalized fatigue and weakness.  She is currently a resident of a skilled nursing facility at [facility].  She is undergoing physical therapy.  She continues to have the baseline shortness of breath and she is currently on home oxygen.  She has no chest pain, cough or hemoptysis.  She denied having any fever or chills.  She has no nausea, vomiting, diarrhea or constipation.  She had several studies performed recently including a PET scan and she is here for evaluation and discussion of her scan results and treatment options.  MEDICAL HISTORY: Past Medical History:  Diagnosis Date  . Anemia   . Arthritis    "all over"  . Carotid artery stenosis    right side followed by [facility]- 65%  . Colitis April 2016  . Colon cancer ([place])   . COPD (chronic obstructive pulmonary disease) ([place])   . Discoid lupus erythematosus   . Diverticulitis April 2016   and Colitis  . History of blood transfusion 01/2015, 06/2015   colectomy  . Hypertension   . Psoriatic arthritis ([place])   . Shortness of breath dyspnea    with exertion  . Squamous cell carcinoma, leg      ALLERGIES:  is allergic to prochlorperazine edisylate; sulfamethoxazole; and doxycycline.  MEDICATIONS:  Current Outpatient Medications  Medication Sig Dispense Refill  . acetaminophen (TYLENOL) 325 MG tablet Take 1,300 mg by mouth every 8 (eight) hours as needed for mild pain.    Marland Kitchen albuterol (PROAIR HFA) 108 (90 Base) MCG/ACT inhaler Inhale 2 puffs into the lungs every 4 (four) hours as needed for wheezing or shortness of breath. 3 Inhaler 1  . atorvastatin (LIPITOR) 10 MG tablet Take 10 mg by mouth daily.    . Calcium Carbonate-Vitamin D 600-400 MG-UNIT per tablet Take 1 tablet by mouth every morning.     . Cyanocobalamin (B-12) 2500 MCG TABS Take 2,500 mcg by mouth daily.    Marland Kitchen docusate sodium (COLACE) 100 MG capsule Take 100 mg by mouth at bedtime.     . famotidine (PEPCID) 20 MG tablet Take 20 mg by mouth 2 (two) times daily.    . feeding supplement, ENSURE ENLIVE, (ENSURE ENLIVE) LIQD Take 237 mLs by mouth 3 (three) times daily between meals. 578 mL 12  . folic acid (FOLVITE) 1 MG tablet Take 1 mg by mouth every morning.     Marland Kitchen guaiFENesin (MUCINEX) 600 MG 12 hr tablet Take 600 mg by mouth 2 (two) times daily as needed for cough.    Marland Kitchen HYDROcodone-homatropine (HYCODAN) 5-1.5 MG/5ML syrup Take 5 mLs by mouth at bedtime and may repeat dose one time if needed. 120 mL 0  . hydrocortisone (ANUSOL-HC) 2.5 % rectal  cream Place 1 application rectally 2 (two) times daily. Apply to rectum every 12 hours as needed for hemorrhoids    . ipratropium-albuterol (DUONEB) 0.5-2.5 (3) MG/3ML SOLN Take 3 mLs by nebulization every 4 (four) hours as needed (shortness of breath/wheezing). 360 mL 0  . iron polysaccharides (NIFEREX) 150 MG capsule Take 150 mg by mouth daily.    Marland Kitchen lidocaine (XYLOCAINE) 2 % solution caregiver: Mix 1part 2% viscous lidocaine, 1part H20. Swallow 33m of diluted mixture, 353m before meals and at bedtime, up to QID 150 mL 2  . loratadine (CLARITIN) 10 MG tablet Take 10 mg by mouth  daily.    . metoprolol tartrate (LOPRESSOR) 25 MG tablet Take 1 tablet (25 mg total) by mouth 2 (two) times daily. 60 tablet 0  . mirtazapine (REMERON) 7.5 MG tablet Take 1 tablet (7.5 mg total) by mouth at bedtime. 10 tablet 0  . Multiple Vitamin (MULTIVITAMIN WITH MINERALS) TABS tablet Take 1 tablet by mouth daily.    . ondansetron (ZOFRAN) 4 MG tablet Take 4 mg by mouth every 6 (six) hours as needed for nausea or vomiting.    . polyethylene glycol (MIRALAX / GLYCOLAX) packet Take 17 g by mouth daily as needed for mild constipation.     . Potassium 99 MG TABS Take 99 mg by mouth daily.    . traMADol (ULTRAM) 50 MG tablet Take 1 tablet (50 mg total) by mouth every 6 (six) hours as needed for moderate pain. 14 tablet 0  . TRELEGY ELLIPTA 100-62.5-25 MCG/INH AEPB USE 1 INHALATION DAILY (Patient taking differently: Inhale 1 puff into the lungs daily. ) 180 each 1  . valsartan-hydrochlorothiazide (DIOVAN-HCT) 160-12.5 MG per tablet Take 1 tablet by mouth every morning.      No current facility-administered medications for this visit.     SURGICAL HISTORY:  Past Surgical History:  Procedure Laterality Date  . APPENDECTOMY  06/2015  . CATARACT EXTRACTION W/ INTRAOCULAR LENS  IMPLANT, BILATERAL Bilateral 2017  . COLONOSCOPY  06/23/15  . COLONOSCOPY W/ POLYPECTOMY    . CYSTOCELE REPAIR  2006  . DILATION AND CURETTAGE OF UTERUS  X 2  . EXCISIONAL HEMORRHOIDECTOMY  2006  . JOINT REPLACEMENT    . LAPAROSCOPIC PARTIAL COLECTOMY N/A 06/23/2015   Procedure: LAPAROSCOPIC ILEOCECETOMY;  Surgeon: BeExcell SeltzerMD;  Location: [facility];  Service: General;  Laterality: N/A;  . MOHS SURGERY Left    "basal; LLE"  . RECTOCELE REPAIR  2006  . SQUAMOUS CELL CARCINOMA EXCISION  "several"   "legs mostly"  . TOTAL KNEE ARTHROPLASTY Left 01/31/2016   Procedure: LEFT TOTAL KNEE ARTHROPLASTY;  Surgeon: NaLeandrew KoyanagiMD;  [facility];  Laterality: Left;  . Marland KitchenAGINAL HYSTERECTOMY  1968    "total"  . VIDEO BRONCHOSCOPY Bilateral 05/06/2018   Procedure: VIDEO BRONCHOSCOPY WITH FLUORO;  Surgeon: ByCollene GobbleMD;  [facility];  Service: Cardiopulmonary;  Laterality: Bilateral;    REVIEW OF SYSTEMS:  Constitutional: positive for fatigue Eyes: negative Ears, nose, mouth, throat, and face: negative Respiratory: positive for dyspnea on exertion Cardiovascular: negative Gastrointestinal: negative Genitourinary:negative Integument/breast: negative Hematologic/lymphatic: negative Musculoskeletal:positive for arthralgias and muscle weakness Neurological: negative Behavioral/Psych: negative Endocrine: negative Allergic/Immunologic: negative   PHYSICAL EXAMINATION: General appearance: alert, cooperative, fatigued and no distress Head: Normocephalic, without obvious abnormality, atraumatic Neck: no adenopathy, no JVD, supple, symmetrical, trachea midline and thyroid not enlarged, symmetric, no tenderness/mass/nodules Lymph nodes: Cervical, supraclavicular, and axillary nodes normal. Resp: wheezes bilaterally Back: symmetric,  no curvature. ROM normal. No CVA tenderness. Cardio: regular rate and rhythm, S1, S2 normal, no murmur, click, rub or gallop GI: soft, non-tender; bowel sounds normal; no masses,  no organomegaly Extremities: extremities normal, atraumatic, no cyanosis or edema Neurologic: Alert and oriented X 3, normal strength and tone. Normal symmetric reflexes. Normal coordination and gait  ECOG PERFORMANCE STATUS: 1 - Symptomatic but completely ambulatory  Blood pressure (!) 100/54, pulse (!) 107, temperature 97.8 F (36.6 C), temperature source Oral, resp. rate 17, height 5' 5"  (1.651 m), weight 145 lb 12.8 oz (66.1 kg), SpO2 98 %.  LABORATORY DATA: Lab Results  Component Value Date   WBC 8.6 07/10/2018   HGB 8.4 (L) 07/10/2018   HCT 27.4 (L) 07/10/2018   MCV 90.1 07/10/2018   PLT 240 07/10/2018      Chemistry      Component Value Date/Time    NA 138 06/19/2018 0759   K 4.3 06/19/2018 0759   CL 98 06/19/2018 0759   CO2 31 06/19/2018 0759   BUN 22 06/19/2018 0759   CREATININE 0.73 06/19/2018 0759   CREATININE 0.62 06/05/2018 0904      Component Value Date/Time   CALCIUM 8.5 (L) 06/19/2018 0759   ALKPHOS 89 06/19/2018 0759   AST 25 06/19/2018 0759   AST 18 06/05/2018 0904   ALT 17 06/19/2018 0759   ALT 13 06/05/2018 0904   BILITOT 0.6 06/19/2018 0759   BILITOT 0.6 06/05/2018 0904       RADIOGRAPHIC STUDIES: Dg Chest 1 View  Result Date: 06/10/2018 CLINICAL DATA:  Status post right thoracentesis today. EXAM: CHEST  1 VIEW COMPARISON:  Single-view of the chest 05/15/2018. CT chest 05/12/2018. FINDINGS: Right pleural effusion appears decreased compared to the prior plain film. No pneumothorax is identified. There is no left effusion. Right basilar airspace disease is again seen. The lungs are emphysematous. Heart size is normal. Aortic atherosclerosis is noted. IMPRESSION: Decreased right pleural effusion after thoracentesis. Negative for pneumothorax. Right basilar airspace disease most compatible with atelectasis. Emphysema. Atherosclerosis. Electronically Signed   By: Inge Rise M.D.   On: 06/10/2018 10:31   Dg Chest 2 View  Result Date: 06/19/2018 CLINICAL DATA:  77 year old female with a history of fever EXAM: CHEST - 2 VIEW COMPARISON:  06/17/2018, 06/14/2018 FINDINGS: Cardiomediastinal silhouette unchanged in size and contour. Improving airspace opacity at the right lung base, with blunting of the bilateral costophrenic angles persisting. Coarsened interstitial markings bilaterally. Meniscus on the lateral view IMPRESSION: Improving airspace opacities, with persisting small pleural effusions. Electronically Signed   By: Corrie Mckusick D.O.   On: 06/19/2018 08:49   Dg Chest 2 View  Result Date: 06/17/2018 CLINICAL DATA:  Shortness of breath.  History of colon cancer, COPD. EXAM: CHEST - 2 VIEW COMPARISON:  Chest  radiograph June 14, 2018 and CT chest June 14, 2018 FINDINGS: Persistent consolidation RIGHT lower lobe. Chronic interstitial changes and increased lung volumes with flattened hemidiaphragms. Small RIGHT pleural effusion. Small nodular densities unchanged. No pneumothorax. Cardiac silhouette is mildly enlarged. Calcified aortic arch. Soft tissue planes and included osseous structures are unchanged. IMPRESSION: 1. Stable appearance of RIGHT lower lobe consolidation/pneumonia. 2. Mild cardiomegaly. 3.  Aortic Atherosclerosis (ICD10-I70.0). 4.  Emphysema (ICD10-J43.9). Electronically Signed   By: Elon Alas M.D.   On: 06/17/2018 13:44   Dg Chest 2 View  Result Date: 06/14/2018 CLINICAL DATA:  Hypoxia, shortness of breath, lung cancer EXAM: CHEST - 2 VIEW COMPARISON:  06/10/2018 FINDINGS: Masslike right lower lobe  opacity, possibly reflecting combination of tumor and/or superimposed infection, progressed. Suspected small right pleural effusion. Left lung is clear. No pneumothorax. The heart is normal in size. IMPRESSION: Progressive masslike right lower lobe opacity, possibly reflecting combination of tumor and/or superimposed infection, with associated small right pleural effusion. Electronically Signed   By: Julian Hy M.D.   On: 06/14/2018 10:27   Ct Chest W Contrast  Result Date: 06/14/2018 CLINICAL DATA:  Productive cough. Hemoptysis and wheezing. Progressive right lower lobe opacity on chest radiography. EXAM: CT CHEST WITH CONTRAST TECHNIQUE: Multidetector CT imaging of the chest was performed during intravenous contrast administration. CONTRAST:  53m OMNIPAQUE IOHEXOL 300 MG/ML  SOLN COMPARISON:  05/12/2017 FINDINGS: Cardiovascular: Coronary, aortic arch, and branch vessel atherosclerotic vascular disease. Mediastinum/Nodes: Scattered small mediastinal lymph nodes including an 8 mm paratracheal node on image 67/2 which is stable, and a 9 mm right hilar lymph node. Currently no overtly  pathologic adenopathy. Lungs/Pleura: There is consolidation of much of the right lung with some sparing of the superior segment. Air bronchograms within the consolidated portion. No central obstructing bronchial lesion. However, within the consolidated portion, there several hypodense areas including a 2.0 by 2.0 peripheral hypodensity on image 117/2 and a 2.9 by 1.4 cm posterior hypodensity on image 119/2. Neither of these 2 hypodense areas was readily apparent on 05/12/2018. Several other vague hypodense areas are noted within the right lower lobe consolidation. Severe centrilobular emphysema. 0.8 by 0.5 cm left upper lobe nodule on image 101/7, stable. 0.7 by 0.5 cm left lower lobe nodule on image 106/7, stable. There is atelectasis medially in the left lower lobe. Upper Abdomen: Hypodense triangular regions in the spleen are probably from splenic infarcts particularly in light of the faint perisplenic edema, and less likely to be due to early vascular phase. Advanced atherosclerosis of the splenic artery noted. Advanced abdominal aortic atherosclerosis noted with extensive plaque formation intruding on the lumen just below the level of the renal arteries. Stable nodularity of the left adrenal gland. Musculoskeletal: Thoracic spondylosis. IMPRESSION: 1. Continued dense consolidation in the right lower lobe with sparing of the superior segment. There are several new hypodense regions within the consolidated right lower lobe which could be from abscess, pulmonary infarct, or tumor. The consolidation could be from radiation pneumonitis, pneumonia, or less likely bronchopulmonary spread of tumor. There is no central truncation of the tracheobronchial tree. Borderline adenopathy in the chest. 2. 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 Origin uncertain but there is advanced atherosclerosis of the splenic artery and abdominal aorta. 3. Stable nodularity of the left adrenal gland. 4.  Aortic Atherosclerosis (ICD10-I70.0).   Coronary atherosclerosis. 5. Emphysema (ICD10-J43.9). 6. There are 2 stable left lung pulmonary nodules which warrants surveillance. Electronically Signed   By: WVan ClinesM.D.   On: 06/14/2018 13:16   Nm Pet Image Initial (pi) Skull Base To Thigh  Result Date: 07/03/2018 CLINICAL DATA:  Initial treatment strategy for lung cancer. EXAM: NUCLEAR MEDICINE PET SKULL BASE TO THIGH TECHNIQUE: 7.6 mCi F-18 FDG was injected intravenously. Full-ring PET imaging was performed from the skull base to thigh after the radiotracer. CT data was obtained and used for attenuation correction and anatomic localization. Fasting blood glucose: 87 mg/dl COMPARISON:  06/14/2018 FINDINGS: Mediastinal blood pool activity: SUV max 2.8 NECK: No hypermetabolic lymph nodes in the neck. Incidental CT findings: none CHEST: There is mild FDG uptake within left paratracheal lymph nodes have an SUV max of 5.55. Mild uptake within low right paratracheal node has an SUV  max of 3.83. Right lower lobe mass like architectural distortion is intensely hypermetabolic. This measures 7.5 x 9.0 by 6.3 cm and is compatible with are hypermetabolic pleural thickening extends along the posterior aspect of the superior segment of right lower lobe suspicious for pleural spread of tumor. Medium size focus of increased uptake within the right middle lobe corresponds to an area of ground-glass attenuation measuring 1.9 cm. SUV max is equal to 6.19. Hypermetabolic nodule within the central right upper lobe measures 7 mm and has an SUV max of 5.25. Within the perihilar aspect of the posterior left upper lobe there is a focal nodule measuring approximately 5 mm within SUV max of 5.03. Incidental CT findings: Advanced changes of emphysema identified. Aortic atherosclerosis. The calcification in the LAD, left circumflex and RCA coronary artery noted. ABDOMEN/PELVIS: No abnormal radiotracer uptake identified within the liver, pancreas, or spleen. Normal  appearance of the right adrenal gland. Mild asymmetric increased uptake within the left adrenal gland has an SUV max of 4.85 and corresponds with 1.5 cm solid nodule in the left adrenal gland, image 105/5. No hypermetabolic retroperitoneal lymph nodes. No hypermetabolic mesenteric, or pelvic lymph nodes. There is a large focus of intense radiotracer uptake which localizes to an area of the distal rectum and anus. The SUV max is equal to 19.13. Incidental CT findings: Aortic atherosclerosis.  No aneurysm. SKELETON: There is area of increased radiotracer uptake within the lower thoracic spine canal measuring approximately 2.6 cm within SUV max of 4.75. Incidental CT findings: none IMPRESSION: 1. Right lower lobe masslike area of architectural distortion and airspace consolidation is intensely hypermetabolic compatible with primary bronchogenic carcinoma. Adjacent area of hypermetabolic pleural thickening along the superior segment of right lower lobe may reflect trans pleural spread of disease. 2. Several nonspecific appearing pulmonary nodules are noted in both lungs which exhibit moderate increased radiotracer uptake. Cannot rule out metastatic disease. 3. Mild to moderate increased uptake is associated with right paratracheal and left paratracheal lymph nodes. Cannot rule out metastatic adenopathy. 4. Left adrenal nodule exhibits increased radiotracer uptake suspicious for metastatic disease. 5. Focal area of increased uptake within the lower thoracic spinal canal is identified. If there are clinical signs or symptoms of possible canal CNS involvement by tumor consider further evaluation with MRI of the thoracic spine. 6. Large focus of intense radiotracer uptake localizing to the distal rectum and anus. Findings suspicious for anal neoplasm. Correlation with direct visualization is advised. 7. Aortic Atherosclerosis (ICD10-I70.0) and Emphysema (ICD10-J43.9). 8. Multi vessel coronary artery atherosclerotic  calcifications. Electronically Signed   By: Kerby Moors M.D.   On: 07/03/2018 16:09   US Thoracentesis Asp Pleural Space W/img Guide  Result Date: 06/10/2018 INDICATION: Patient with prior history of colon cancer and now with newly diagnosed lung cancer, dyspnea, right pleural effusion. Request made for diagnostic and therapeutic right thoracentesis. EXAM: ULTRASOUND GUIDED DIAGNOSTIC AND THERAPEUTIC RIGHT THORACENTESIS MEDICATIONS: None COMPLICATIONS: None immediate. PROCEDURE: An ultrasound guided thoracentesis was thoroughly discussed with the patient and questions answered. The benefits, risks, alternatives and complications were also discussed. The patient understands and wishes to proceed with the procedure. Written consent was obtained. Ultrasound was performed to localize and mark an adequate pocket of fluid in the right chest. The area was then prepped and draped in the normal sterile fashion. 1% Lidocaine was used for local anesthesia. Under ultrasound guidance a 6 Fr Safe-T-Centesis catheter was introduced. Thoracentesis was performed. The catheter was removed and a dressing applied. FINDINGS: A total of  approximately 50 cc of yellow fluid was removed. Samples were sent to the laboratory as requested by the clinical team. The right pleural effusion was very small. No significant left pleural effusion noted. IMPRESSION: Successful ultrasound guided diagnostic and therapeutic right thoracentesis yielding 50 cc of pleural fluid. Read by: Rowe Robert, PA-C Electronically Signed   By: Jerilynn Mages.  Shick M.D.   On: 06/10/2018 10:15    ASSESSMENT AND PLAN: This is a very pleasant 77 years old white female with stage IV non-small cell lung cancer, adenocarcinoma with positive RET mutation and PDL 1 expression of 5%. She presented with right lower lobe masslike consolidation in addition to pulmonary nodules in both lungs as well as mild to moderate increased and suspicious right paratracheal and left  paratracheal lymph nodes and suspicious left adrenal nodule. I had a lengthy discussion with the patient and her family about her current disease stage, prognosis and treatment options. I gave the patient the option of palliative care and hospice referral versus consideration of palliative treatment with single agent immunotherapy with Keytruda 200 mg IV every 3 weeks. Unfortunately the patient is not a great candidate for treatment with a combination of systemic chemotherapy and Ketruda (pembrolizumab). The patient also has a RET mutation but unfortunately there is no available target therapy in the frontline setting at this point and all the available treatments are on clinical trial basis at this point.  She does not have the ability or the means to travel for any of the current clinical trials and institutions. After a lengthy discussion of all the options, the patient and her family would like to proceed with treatment with single agent Ketruda (pembrolizumab) for now.  She has PDL 1 expression of 5% which according to the recent FDA approval and keynote 42, this patient would be eligible to be treated with single agent Ketruda (pembrolizumab). I discussed with him the adverse effect of this treatment including but not limited to immunotherapy mediated skin rash, diarrhea, inflammation of the lung, kidney, liver, thyroid or other endocrine dysfunction including type 1 diabetes mellitus. The patient is expected to start the first cycle of this treatment next week. She will come back for follow-up visit in 4 weeks for evaluation before starting cycle #2. I will arrange for the patient to have a chemotherapy education class before the first dose of her treatment. She was advised to call immediately if she has any concerning symptoms in the interval.  The patient voices understanding of current disease status and treatment options and is in agreement with the current care plan.  All questions were  answered. The patient knows to call the clinic with any problems, questions or concerns. We can certainly see the patient much sooner if necessary.  I spent 20 minutes counseling the patient face to face. The total time spent in the appointment was 30 minutes.  Disclaimer: This note was dictated with voice recognition software. Similar sounding words can inadvertently be transcribed and may not be corrected upon review.

## 2018-07-10 NOTE — Telephone Encounter (Signed)
Scheduled appt per 9/20 los - gave patient AVS and calender per los.   

## 2018-07-12 ENCOUNTER — Encounter: Payer: Self-pay | Admitting: Internal Medicine

## 2018-07-12 DIAGNOSIS — Z5112 Encounter for antineoplastic immunotherapy: Secondary | ICD-10-CM | POA: Insufficient documentation

## 2018-07-14 ENCOUNTER — Inpatient Hospital Stay: Payer: Medicare Other

## 2018-07-14 DIAGNOSIS — J449 Chronic obstructive pulmonary disease, unspecified: Secondary | ICD-10-CM | POA: Diagnosis not present

## 2018-07-14 DIAGNOSIS — D638 Anemia in other chronic diseases classified elsewhere: Secondary | ICD-10-CM | POA: Diagnosis not present

## 2018-07-14 DIAGNOSIS — C3431 Malignant neoplasm of lower lobe, right bronchus or lung: Secondary | ICD-10-CM | POA: Diagnosis not present

## 2018-07-14 DIAGNOSIS — I9589 Other hypotension: Secondary | ICD-10-CM | POA: Diagnosis not present

## 2018-07-15 ENCOUNTER — Other Ambulatory Visit: Payer: Self-pay | Admitting: *Deleted

## 2018-07-15 ENCOUNTER — Inpatient Hospital Stay: Payer: Medicare Other

## 2018-07-15 VITALS — BP 107/60 | HR 99 | Temp 98.2°F | Resp 20

## 2018-07-15 DIAGNOSIS — R5382 Chronic fatigue, unspecified: Secondary | ICD-10-CM

## 2018-07-15 DIAGNOSIS — Z85038 Personal history of other malignant neoplasm of large intestine: Secondary | ICD-10-CM | POA: Diagnosis not present

## 2018-07-15 DIAGNOSIS — Z5111 Encounter for antineoplastic chemotherapy: Secondary | ICD-10-CM | POA: Diagnosis not present

## 2018-07-15 DIAGNOSIS — C3431 Malignant neoplasm of lower lobe, right bronchus or lung: Secondary | ICD-10-CM | POA: Diagnosis not present

## 2018-07-15 DIAGNOSIS — C3491 Malignant neoplasm of unspecified part of right bronchus or lung: Secondary | ICD-10-CM

## 2018-07-15 DIAGNOSIS — I10 Essential (primary) hypertension: Secondary | ICD-10-CM | POA: Diagnosis not present

## 2018-07-15 DIAGNOSIS — I2510 Atherosclerotic heart disease of native coronary artery without angina pectoris: Secondary | ICD-10-CM | POA: Diagnosis not present

## 2018-07-15 DIAGNOSIS — Z79899 Other long term (current) drug therapy: Secondary | ICD-10-CM | POA: Diagnosis not present

## 2018-07-15 LAB — CBC WITH DIFFERENTIAL (CANCER CENTER ONLY)
Basophils Absolute: 0 10*3/uL (ref 0.0–0.1)
Basophils Relative: 1 %
EOS PCT: 4 %
Eosinophils Absolute: 0.3 10*3/uL (ref 0.0–0.5)
HEMATOCRIT: 27.4 % — AB (ref 34.8–46.6)
Hemoglobin: 8.8 g/dL — ABNORMAL LOW (ref 11.6–15.9)
LYMPHS PCT: 3 %
Lymphs Abs: 0.2 10*3/uL — ABNORMAL LOW (ref 0.9–3.3)
MCH: 28 pg (ref 25.1–34.0)
MCHC: 32.2 g/dL (ref 31.5–36.0)
MCV: 86.8 fL (ref 79.5–101.0)
MONO ABS: 0.4 10*3/uL (ref 0.1–0.9)
MONOS PCT: 6 %
Neutro Abs: 5.9 10*3/uL (ref 1.5–6.5)
Neutrophils Relative %: 86 %
PLATELETS: 327 10*3/uL (ref 145–400)
RBC: 3.16 MIL/uL — ABNORMAL LOW (ref 3.70–5.45)
RDW: 18.6 % — AB (ref 11.2–14.5)
WBC Count: 6.8 10*3/uL (ref 3.9–10.3)

## 2018-07-15 LAB — CMP (CANCER CENTER ONLY)
ALT: 15 U/L (ref 0–44)
AST: 28 U/L (ref 15–41)
Albumin: 2.1 g/dL — ABNORMAL LOW (ref 3.5–5.0)
Alkaline Phosphatase: 131 U/L — ABNORMAL HIGH (ref 38–126)
Anion gap: 8 (ref 5–15)
BUN: 15 mg/dL (ref 8–23)
CO2: 29 mmol/L (ref 22–32)
Calcium: 9.1 mg/dL (ref 8.9–10.3)
Chloride: 102 mmol/L (ref 98–111)
Creatinine: 0.67 mg/dL (ref 0.44–1.00)
GFR, Est AFR Am: >60 mL/min (ref >60)
GFR, Estimated: >60 mL/min (ref >60)
GLUCOSE: 96 mg/dL (ref 70–99)
POTASSIUM: 4 mmol/L (ref 3.5–5.1)
Sodium: 139 mmol/L (ref 135–145)
TOTAL PROTEIN: 7 g/dL (ref 6.5–8.1)
Total Bilirubin: 0.4 mg/dL (ref 0.3–1.2)

## 2018-07-15 LAB — TSH: TSH: 2.106 u[IU]/mL (ref 0.308–3.960)

## 2018-07-15 MED ORDER — SODIUM CHLORIDE 0.9 % IV SOLN
Freq: Once | INTRAVENOUS | Status: AC
  Administered 2018-07-15: 10:00:00 via INTRAVENOUS
  Filled 2018-07-15: qty 250

## 2018-07-15 MED ORDER — PROCHLORPERAZINE MALEATE 10 MG PO TABS
ORAL_TABLET | ORAL | Status: AC
  Filled 2018-07-15: qty 1

## 2018-07-15 MED ORDER — PROCHLORPERAZINE MALEATE 10 MG PO TABS: 10.0000 mg | ORAL_TABLET | Freq: Once | ORAL | Status: DC

## 2018-07-15 MED ORDER — SODIUM CHLORIDE 0.9 % IV SOLN
200.0000 mg | Freq: Once | INTRAVENOUS | Status: AC
  Administered 2018-07-15: 200 mg via INTRAVENOUS
  Filled 2018-07-15: qty 8

## 2018-07-15 NOTE — Patient Outreach (Signed)
[facility]) Care Management  07/15/2018  RENELLE STEGENGA [birth year] [id number]  Onsite IDT meeting.   Patient started a new chemo medication for treatment. She will be at SNF at least another week.  Plan to collaborate with [facility] LCSW and care team with update. [name]. [name], RN, BSN, [place] [phone]) Business Cell  [phone]) Toll Free Office

## 2018-07-15 NOTE — Patient Instructions (Addendum)
[facility] Discharge Instructions for Patients Receiving Chemotherapy  Today you received the following chemotherapy agents: Keytruda.  To help prevent nausea and vomiting after your treatment, we encourage you to take your nausea medication as prescribed. If you need medication for nausea/vomiting, please call our clinic at [phone]. If you develop nausea and vomiting that is not controlled by your nausea medication, call the clinic.   BELOW ARE SYMPTOMS THAT SHOULD BE REPORTED IMMEDIATELY:  *FEVER GREATER THAN 100.5 F  *CHILLS WITH OR WITHOUT FEVER  NAUSEA AND VOMITING THAT IS NOT CONTROLLED WITH YOUR NAUSEA MEDICATION  *UNUSUAL SHORTNESS OF BREATH  *UNUSUAL BRUISING OR BLEEDING  TENDERNESS IN MOUTH AND THROAT WITH OR WITHOUT PRESENCE OF ULCERS  *URINARY PROBLEMS  *BOWEL PROBLEMS  UNUSUAL RASH Items with * indicate a potential emergency and should be followed up as soon as possible.  Feel free to call the clinic should you have any questions or concerns. The clinic phone number is [phone].  Please show the [facility] at check-in to the Emergency Department and triage nurse.   Pembrolizumab injection Beryle Flock) What is this medicine? PEMBROLIZUMAB (pem broe liz ue mab) is a monoclonal antibody. It is used to treat melanoma, head and neck cancer, Hodgkin lymphoma, non-small cell lung cancer, urothelial cancer, stomach cancer, and cancers that have a certain genetic condition. This medicine may be used for other purposes; ask your health care provider or pharmacist if you have questions. COMMON BRAND NAME(S): Keytruda What should I tell my health care provider before I take this medicine? They need to know if you have any of these conditions: -diabetes -immune system problems -inflammatory bowel disease -liver disease -lung or breathing disease -lupus -organ transplant -an unusual or allergic reaction to pembrolizumab, other medicines,  foods, dyes, or preservatives -pregnant or trying to get pregnant -breast-feeding How should I use this medicine? This medicine is for infusion into a vein. It is given by a health care professional in a hospital or clinic setting. A special MedGuide will be given to you before each treatment. Be sure to read this information carefully each time. Talk to your pediatrician regarding the use of this medicine in children. While this drug may be prescribed for selected conditions, precautions do apply. Overdosage: If you think you have taken too much of this medicine contact a poison control center or emergency room at once. NOTE: This medicine is only for you. Do not share this medicine with others. What if I miss a dose? It is important not to miss your dose. Call your doctor or health care professional if you are unable to keep an appointment. What may interact with this medicine? Interactions have not been studied. Give your health care provider a list of all the medicines, herbs, non-prescription drugs, or dietary supplements you use. Also tell them if you smoke, drink alcohol, or use illegal drugs. Some items may interact with your medicine. This list may not describe all possible interactions. Give your health care provider a list of all the medicines, herbs, non-prescription drugs, or dietary supplements you use. Also tell them if you smoke, drink alcohol, or use illegal drugs. Some items may interact with your medicine. What should I watch for while using this medicine? Your condition will be monitored carefully while you are receiving this medicine. You may need blood work done while you are taking this medicine. Do not become pregnant while taking this medicine or for 4 months after stopping it. Women should inform  their doctor if they wish to become pregnant or think they might be pregnant. There is a potential for serious side effects to an unborn child. Talk to your health care  professional or pharmacist for more information. Do not breast-feed an infant while taking this medicine or for 4 months after the last dose. What side effects may I notice from receiving this medicine? Side effects that you should report to your doctor or health care professional as soon as possible: -allergic reactions like skin rash, itching or hives, swelling of the face, lips, or tongue -bloody or black, tarry -breathing problems -changes in vision -chest pain -chills -constipation -cough -dizziness or feeling faint or lightheaded -fast or irregular heartbeat -fever -flushing -hair loss -low blood counts - this medicine may decrease the number of white blood cells, red blood cells and platelets. You may be at increased risk for infections and bleeding. -muscle pain -muscle weakness -persistent headache -signs and symptoms of high blood sugar such as dizziness; dry mouth; dry skin; fruity breath; nausea; stomach pain; increased hunger or thirst; increased urination -signs and symptoms of kidney injury like trouble passing urine or change in the amount of urine -signs and symptoms of liver injury like dark urine, light-colored stools, loss of appetite, nausea, right upper belly pain, yellowing of the eyes or skin -stomach pain -sweating -weight loss Side effects that usually do not require medical attention (report to your doctor or health care professional if they continue or are bothersome): -decreased appetite -diarrhea -tiredness This list may not describe all possible side effects. Call your doctor for medical advice about side effects. You may report side effects to FDA at 1-800-FDA-1088. Where should I keep my medicine? This drug is given in a hospital or clinic and will not be stored at home. NOTE: This sheet is a summary. It may not cover all possible information. If you have questions about this medicine, talk to your doctor, pharmacist, or health care provider.  2018  Elsevier/Gold Standard (2016-07-16 12:29:36)

## 2018-07-17 DIAGNOSIS — I9589 Other hypotension: Secondary | ICD-10-CM | POA: Diagnosis not present

## 2018-07-17 DIAGNOSIS — R0902 Hypoxemia: Secondary | ICD-10-CM | POA: Diagnosis not present

## 2018-07-17 DIAGNOSIS — C3431 Malignant neoplasm of lower lobe, right bronchus or lung: Secondary | ICD-10-CM | POA: Diagnosis not present

## 2018-07-17 DIAGNOSIS — J449 Chronic obstructive pulmonary disease, unspecified: Secondary | ICD-10-CM | POA: Diagnosis not present

## 2018-07-20 ENCOUNTER — Telehealth: Payer: Self-pay | Admitting: Medical Oncology

## 2018-07-20 NOTE — Telephone Encounter (Signed)
Pt will be "kicked out of [facility] re[facility] on oct 3rd-her rehab days are running out".  Dtr wants information about hospice services because she doesn;'t know what else to do. Pt will be going home with husband who has a bad back. Pt wears diapers and sits in wheelchair all day. She needs assist with ADLS. She wants information about skilled facility too. She said [facility] is not an option to stay there and get [place] because insurance won't pay for it. Message to Gwinda Maine.

## 2018-07-21 ENCOUNTER — Ambulatory Visit: Payer: Self-pay | Admitting: *Deleted

## 2018-07-22 ENCOUNTER — Other Ambulatory Visit: Payer: Self-pay | Admitting: *Deleted

## 2018-07-22 DIAGNOSIS — C3431 Malignant neoplasm of lower lobe, right bronchus or lung: Secondary | ICD-10-CM | POA: Diagnosis not present

## 2018-07-22 DIAGNOSIS — I10 Essential (primary) hypertension: Secondary | ICD-10-CM | POA: Diagnosis not present

## 2018-07-22 DIAGNOSIS — E785 Hyperlipidemia, unspecified: Secondary | ICD-10-CM | POA: Diagnosis not present

## 2018-07-22 DIAGNOSIS — J449 Chronic obstructive pulmonary disease, unspecified: Secondary | ICD-10-CM | POA: Diagnosis not present

## 2018-07-22 NOTE — Patient Outreach (Signed)
[facility] [employer]) Care Management  07/22/2018  [name] Sep 13, 1941 [id number]   This RNCM notified by [facility] that patient will discharge 07/24/18. Will let [facility] care team know of discharge date for follow up [place]. [name], RN, BSN, [place] [phone]) Business Cell  [phone]) Toll Free Office

## 2018-07-27 DIAGNOSIS — J449 Chronic obstructive pulmonary disease, unspecified: Secondary | ICD-10-CM | POA: Diagnosis not present

## 2018-07-27 DIAGNOSIS — Z79899 Other long term (current) drug therapy: Secondary | ICD-10-CM | POA: Diagnosis not present

## 2018-07-27 DIAGNOSIS — C3431 Malignant neoplasm of lower lobe, right bronchus or lung: Secondary | ICD-10-CM | POA: Diagnosis not present

## 2018-07-27 DIAGNOSIS — J9621 Acute and chronic respiratory failure with hypoxia: Secondary | ICD-10-CM | POA: Diagnosis not present

## 2018-07-27 DIAGNOSIS — I10 Essential (primary) hypertension: Secondary | ICD-10-CM | POA: Diagnosis not present

## 2018-07-27 DIAGNOSIS — E7849 Other hyperlipidemia: Secondary | ICD-10-CM | POA: Diagnosis not present

## 2018-07-27 DIAGNOSIS — I9589 Other hypotension: Secondary | ICD-10-CM | POA: Diagnosis not present

## 2018-07-27 DIAGNOSIS — D649 Anemia, unspecified: Secondary | ICD-10-CM | POA: Diagnosis not present

## 2018-07-27 DIAGNOSIS — Z8701 Personal history of pneumonia (recurrent): Secondary | ICD-10-CM | POA: Diagnosis not present

## 2018-07-27 DIAGNOSIS — D6489 Other specified anemias: Secondary | ICD-10-CM | POA: Diagnosis not present

## 2018-07-27 DIAGNOSIS — L930 Discoid lupus erythematosus: Secondary | ICD-10-CM | POA: Diagnosis not present

## 2018-07-27 DIAGNOSIS — E785 Hyperlipidemia, unspecified: Secondary | ICD-10-CM | POA: Diagnosis not present

## 2018-07-27 DIAGNOSIS — Z9981 Dependence on supplemental oxygen: Secondary | ICD-10-CM | POA: Diagnosis not present

## 2018-07-27 DIAGNOSIS — Z79891 Long term (current) use of opiate analgesic: Secondary | ICD-10-CM | POA: Diagnosis not present

## 2018-07-28 ENCOUNTER — Ambulatory Visit: Payer: Self-pay | Admitting: *Deleted

## 2018-07-28 DIAGNOSIS — J449 Chronic obstructive pulmonary disease, unspecified: Secondary | ICD-10-CM | POA: Diagnosis not present

## 2018-07-28 DIAGNOSIS — L930 Discoid lupus erythematosus: Secondary | ICD-10-CM | POA: Diagnosis not present

## 2018-07-28 DIAGNOSIS — J9621 Acute and chronic respiratory failure with hypoxia: Secondary | ICD-10-CM | POA: Diagnosis not present

## 2018-07-28 DIAGNOSIS — C3431 Malignant neoplasm of lower lobe, right bronchus or lung: Secondary | ICD-10-CM | POA: Diagnosis not present

## 2018-07-28 DIAGNOSIS — D649 Anemia, unspecified: Secondary | ICD-10-CM | POA: Diagnosis not present

## 2018-07-28 DIAGNOSIS — I10 Essential (primary) hypertension: Secondary | ICD-10-CM | POA: Diagnosis not present

## 2018-07-29 DIAGNOSIS — J9621 Acute and chronic respiratory failure with hypoxia: Secondary | ICD-10-CM | POA: Diagnosis not present

## 2018-07-29 DIAGNOSIS — J449 Chronic obstructive pulmonary disease, unspecified: Secondary | ICD-10-CM | POA: Diagnosis not present

## 2018-07-29 DIAGNOSIS — L930 Discoid lupus erythematosus: Secondary | ICD-10-CM | POA: Diagnosis not present

## 2018-07-29 DIAGNOSIS — D649 Anemia, unspecified: Secondary | ICD-10-CM | POA: Diagnosis not present

## 2018-07-29 DIAGNOSIS — I10 Essential (primary) hypertension: Secondary | ICD-10-CM | POA: Diagnosis not present

## 2018-07-29 DIAGNOSIS — C3431 Malignant neoplasm of lower lobe, right bronchus or lung: Secondary | ICD-10-CM | POA: Diagnosis not present

## 2018-07-30 DIAGNOSIS — I10 Essential (primary) hypertension: Secondary | ICD-10-CM | POA: Diagnosis not present

## 2018-07-30 DIAGNOSIS — D649 Anemia, unspecified: Secondary | ICD-10-CM | POA: Diagnosis not present

## 2018-07-30 DIAGNOSIS — J9621 Acute and chronic respiratory failure with hypoxia: Secondary | ICD-10-CM | POA: Diagnosis not present

## 2018-07-30 DIAGNOSIS — J449 Chronic obstructive pulmonary disease, unspecified: Secondary | ICD-10-CM | POA: Diagnosis not present

## 2018-07-30 DIAGNOSIS — C3431 Malignant neoplasm of lower lobe, right bronchus or lung: Secondary | ICD-10-CM | POA: Diagnosis not present

## 2018-07-30 DIAGNOSIS — L930 Discoid lupus erythematosus: Secondary | ICD-10-CM | POA: Diagnosis not present

## 2018-07-31 ENCOUNTER — Emergency Department (HOSPITAL_COMMUNITY)
Admission: EM | Admit: 2018-07-31 | Discharge: 2018-08-01 | Disposition: A | Discharge status: Home / Self Care | Payer: Medicare Other | Attending: Emergency Medicine | Admitting: Emergency Medicine

## 2018-07-31 ENCOUNTER — Other Ambulatory Visit: Payer: Self-pay

## 2018-07-31 ENCOUNTER — Encounter (HOSPITAL_COMMUNITY): Payer: Self-pay | Admitting: Emergency Medicine

## 2018-07-31 DIAGNOSIS — Z87891 Personal history of nicotine dependence: Secondary | ICD-10-CM | POA: Insufficient documentation

## 2018-07-31 DIAGNOSIS — K5901 Slow transit constipation: Secondary | ICD-10-CM

## 2018-07-31 DIAGNOSIS — J449 Chronic obstructive pulmonary disease, unspecified: Secondary | ICD-10-CM | POA: Diagnosis not present

## 2018-07-31 DIAGNOSIS — L930 Discoid lupus erythematosus: Secondary | ICD-10-CM | POA: Diagnosis not present

## 2018-07-31 DIAGNOSIS — R52 Pain, unspecified: Secondary | ICD-10-CM | POA: Diagnosis not present

## 2018-07-31 DIAGNOSIS — Z79899 Other long term (current) drug therapy: Secondary | ICD-10-CM | POA: Insufficient documentation

## 2018-07-31 DIAGNOSIS — J9621 Acute and chronic respiratory failure with hypoxia: Secondary | ICD-10-CM | POA: Diagnosis not present

## 2018-07-31 DIAGNOSIS — I10 Essential (primary) hypertension: Secondary | ICD-10-CM | POA: Insufficient documentation

## 2018-07-31 DIAGNOSIS — D649 Anemia, unspecified: Secondary | ICD-10-CM | POA: Diagnosis not present

## 2018-07-31 DIAGNOSIS — R1084 Generalized abdominal pain: Secondary | ICD-10-CM | POA: Diagnosis not present

## 2018-07-31 DIAGNOSIS — K5900 Constipation, unspecified: Secondary | ICD-10-CM | POA: Diagnosis not present

## 2018-07-31 DIAGNOSIS — C3431 Malignant neoplasm of lower lobe, right bronchus or lung: Secondary | ICD-10-CM | POA: Diagnosis not present

## 2018-07-31 DIAGNOSIS — R0902 Hypoxemia: Secondary | ICD-10-CM | POA: Diagnosis not present

## 2018-07-31 LAB — CBC WITH DIFFERENTIAL/PLATELET
Abs Immature Granulocytes: 0.07 10*3/uL (ref 0.00–0.07)
BASOS PCT: 0 %
Basophils Absolute: 0 10*3/uL (ref 0.0–0.1)
EOS ABS: 0.1 10*3/uL (ref 0.0–0.5)
Eosinophils Relative: 1 %
HCT: 29.9 % — ABNORMAL LOW (ref 36.0–46.0)
Hemoglobin: 8.9 g/dL — ABNORMAL LOW (ref 12.0–15.0)
IMMATURE GRANULOCYTES: 1 %
Lymphocytes Relative: 5 %
Lymphs Abs: 0.5 10*3/uL — ABNORMAL LOW (ref 0.7–4.0)
MCH: 27.5 pg (ref 26.0–34.0)
MCHC: 29.8 g/dL — ABNORMAL LOW (ref 30.0–36.0)
MCV: 92.3 fL (ref 80.0–100.0)
MONOS PCT: 7 %
Monocytes Absolute: 0.7 10*3/uL (ref 0.1–1.0)
NRBC: 0 % (ref 0.0–0.2)
Neutro Abs: 8.4 10*3/uL — ABNORMAL HIGH (ref 1.7–7.7)
Neutrophils Relative %: 86 %
PLATELETS: 234 10*3/uL (ref 150–400)
RBC: 3.24 MIL/uL — AB (ref 3.87–5.11)
RDW: 19 % — AB (ref 11.5–15.5)
WBC: 9.8 10*3/uL (ref 4.0–10.5)

## 2018-07-31 MED ORDER — FENTANYL CITRATE (PF) 100 MCG/2ML IJ SOLN
100.0000 ug | Freq: Once | INTRAMUSCULAR | Status: AC
  Administered 2018-07-31: 100 ug via INTRAVENOUS
  Filled 2018-07-31: qty 2

## 2018-07-31 MED ORDER — ONDANSETRON HCL 4 MG/2ML IJ SOLN
4.0000 mg | Freq: Once | INTRAMUSCULAR | Status: AC
  Administered 2018-07-31: 4 mg via INTRAVENOUS
  Filled 2018-07-31: qty 2

## 2018-07-31 NOTE — ED Notes (Signed)
Bed: WA07 Expected date:  Expected time:  Means of arrival:  Comments: EMS Stage 4 cancer/constipation

## 2018-07-31 NOTE — ED Triage Notes (Signed)
Patient complains of constipation for a week. She has tried multiple remedies seeking relief to no success. She has been experiencing diarrhea and was recently on long term antibiotics per EMS. She also has had a decrease in PO intake.

## 2018-07-31 NOTE — ED Provider Notes (Signed)
[facility] DEPT Provider Note   CSN: [id number] Arrival date & time: 07/31/18  2140     History   Chief Complaint Chief Complaint  Patient presents with  . Constipation    HPI Casey Wilkinson is a 77 y.o. female.  The history is provided by the patient and a relative.  Constipation   This is a new problem. The current episode started more than 1 week ago. Associated symptoms include abdominal pain. Pertinent negatives include no dysuria. Treatments tried: Disimpaction, enema. The treatment provided no relief.   Patient with history of COPD, stage IV lung CA, previous history of colon cancer presents with abdominal pain and constipation.  She reports its been over a week since her last bowel movement.  She is now reporting abdominal pain.  She denies vomiting.  No fever.  Her home health nurse came to her house to fecally disimpact her, but this did not improve her symptoms She has also tried enemas without relief. Past Medical History:  Diagnosis Date  . Anemia   . Arthritis    "all over"  . Carotid artery stenosis    right side followed by [facility]- 65%  . Colitis April 2016  . Colon cancer ([place])   . COPD (chronic obstructive pulmonary disease) ([place])   . Discoid lupus erythematosus   . Diverticulitis April 2016   and Colitis  . History of blood transfusion 01/2015, 06/2015   colectomy  . Hypertension   . Psoriatic arthritis ([place])   . Shortness of breath dyspnea    with exertion  . Squamous cell carcinoma, leg     Patient Active Problem List   Diagnosis Date Noted  . Encounter for antineoplastic immunotherapy 07/12/2018  . Adenocarcinoma of right lung, stage 4 ([place]) 07/10/2018  . Anemia 06/14/2018  . Hypoxia 06/14/2018  . Goals of care, counseling/discussion 06/07/2018  . HCAP (healthcare-associated pneumonia) 05/12/2018  . Adenocarcinoma of right lung ([place])   . Sepsis due to pneumonia ([place]) 05/06/2018  . S/P bronchoscopy with biopsy    . Pleuritic pain 04/30/2018  . Hemoptysis 04/30/2018  . Right lower lobe pneumonia ([place]) 03/31/2018  . Chronic rhinitis 11/04/2017  . Pain and swelling of right lower leg 05/05/2017  . Acute on chronic respiratory failure with hypoxia ([place]) 03/12/2017  . Pain of right hand 01/28/2017  . 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 Degenerative arthritis of left knee 01/31/2016  . 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 Nausea with vomiting   . Acute ischemic colitis ([place])   . Rectal bleeding   . Abdominal pain   . Blood in stool   . 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 Colitis 02/06/2015  . Abdominal mass 02/06/2015  . HTN (hypertension) 02/06/2015  . Peripheral vascular disease ([place]) 02/24/2014  . LUPUS ERYTHEMATOSUS, DISCOID 11/21/2009  . Essential hypertension 12/08/2008  . COPD (chronic obstructive pulmonary disease) ([place]) 12/08/2008    Past Surgical History:  Procedure Laterality Date  . APPENDECTOMY  06/2015  . CATARACT EXTRACTION W/ INTRAOCULAR LENS  IMPLANT, BILATERAL Bilateral 2017  . COLONOSCOPY  06/23/15  . COLONOSCOPY W/ POLYPECTOMY    . CYSTOCELE REPAIR  2006  . DILATION AND CURETTAGE OF UTERUS  X 2  . EXCISIONAL HEMORRHOIDECTOMY  2006  . JOINT REPLACEMENT    . LAPAROSCOPIC PARTIAL COLECTOMY N/A 06/23/2015   Procedure: LAPAROSCOPIC ILEOCECETOMY;  Surgeon: Excell Seltzer, MD;  Location: [facility];  Service: General;  Laterality: N/A;  . MOHS SURGERY Left    "  basal; LLE"  . RECTOCELE REPAIR  2006  . SQUAMOUS CELL CARCINOMA EXCISION  "several"   "legs mostly"  . TOTAL KNEE ARTHROPLASTY Left 01/31/2016   Procedure: LEFT TOTAL KNEE ARTHROPLASTY;  Surgeon: Leandrew Koyanagi, MD;  Location: [facility];  Service: Orthopedics;  Laterality: Left;  Marland Kitchen VAGINAL HYSTERECTOMY  1968   "total"  . VIDEO BRONCHOSCOPY Bilateral 05/06/2018   Procedure: VIDEO BRONCHOSCOPY WITH FLUORO;  Surgeon: Collene Gobble, MD;  [facility];  Service: Cardiopulmonary;  Laterality:  Bilateral;     OB History   None      Home Medications    Prior to Admission medications   Medication Sig Start Date End Date Taking? Authorizing Provider  acetaminophen (TYLENOL) 325 MG tablet Take 1,300 mg by mouth every 8 (eight) hours as needed for mild pain.    [provider]  albuterol (PROAIR HFA) 108 (90 Base) MCG/ACT inhaler Inhale 2 puffs into the lungs every 4 (four) hours as needed for wheezing or shortness of breath. 03/31/18   Collene Gobble, MD  atorvastatin (LIPITOR) 10 MG tablet Take 10 mg by mouth daily.    [provider]  Calcium Carbonate-Vitamin D 600-400 MG-UNIT per tablet Take 1 tablet by mouth every morning.     [provider]  Cyanocobalamin (B-12) 2500 MCG TABS Take 2,500 mcg by mouth daily.    [provider]  docusate sodium (COLACE) 100 MG capsule Take 100 mg by mouth at bedtime.     [provider]  famotidine (PEPCID) 20 MG tablet Take 20 mg by mouth 2 (two) times daily.    [provider]  feeding supplement, ENSURE ENLIVE, (ENSURE ENLIVE) LIQD Take 237 mLs by mouth 3 (three) times daily between meals. 06/18/18   Aline August, MD  folic acid (FOLVITE) 1 MG tablet Take 1 mg by mouth every morning.     [provider]  guaiFENesin (MUCINEX) 600 MG 12 hr tablet Take 600 mg by mouth 2 (two) times daily as needed for cough.    [provider]  HYDROcodone-homatropine (HYCODAN) 5-1.5 MG/5ML syrup Take 5 mLs by mouth at bedtime and may repeat dose one time if needed. 06/18/18   Aline August, MD  hydrocortisone (ANUSOL-HC) 2.5 % rectal cream Place 1 application rectally 2 (two) times daily. Apply to rectum every 12 hours as needed for hemorrhoids    [provider]  ipratropium-albuterol (DUONEB) 0.5-2.5 (3) MG/3ML SOLN Take 3 mLs by nebulization every 4 (four) hours as needed (shortness of breath/wheezing). 06/18/18   Aline August, MD  iron polysaccharides (NIFEREX) 150 MG capsule  Take 150 mg by mouth daily.    [provider]  lidocaine (XYLOCAINE) 2 % solution caregiver: Mix 1part 2% viscous lidocaine, 1part H20. Swallow 66mL of diluted mixture, 52min before meals and at bedtime, up to QID 06/03/18   Eppie Gibson, MD  loratadine (CLARITIN) 10 MG tablet Take 10 mg by mouth daily.    [provider]  metoprolol tartrate (LOPRESSOR) 25 MG tablet Take 1 tablet (25 mg total) by mouth 2 (two) times daily. 06/18/18 07/18/18  Aline August, MD  mirtazapine (REMERON) 7.5 MG tablet Take 1 tablet (7.5 mg total) by mouth at bedtime. 06/18/18   Aline August, MD  Multiple Vitamin (MULTIVITAMIN WITH MINERALS) TABS tablet Take 1 tablet by mouth daily.    [provider]  ondansetron (ZOFRAN) 4 MG tablet Take 4 mg by mouth every 6 (six) hours as needed for nausea or  vomiting.    [provider]  polyethylene glycol (MIRALAX / GLYCOLAX) packet Take 17 g by mouth daily as needed for mild constipation.     [provider]  Potassium 99 MG TABS Take 99 mg by mouth daily.    [provider]  traMADol (ULTRAM) 50 MG tablet Take 1 tablet (50 mg total) by mouth every 6 (six) hours as needed for moderate pain. 06/18/18   Aline August, MD  TRELEGY ELLIPTA 100-62.5-25 MCG/INH AEPB USE 1 INHALATION DAILY Patient taking differently: Inhale 1 puff into the lungs daily.  02/26/18   Collene Gobble, MD  valsartan-hydrochlorothiazide (DIOVAN-HCT) 160-12.5 MG per tablet Take 1 tablet by mouth every morning.  02/11/15   [provider]    Family History Family History  Problem Relation Age of Onset  . Heart disease Father        Before age 46  . Hyperlipidemia Father   . Hypertension Father   . Pneumonia Mother   . Alzheimer's disease Mother   . Colon cancer Neg Hx     Social History Social History   Tobacco Use  . Smoking status: Former Smoker    Packs/day: 0.50    Years: 50.00    Pack years: 25.00    Types: Cigarettes    Last  attempt to quit: 12/05/2000    Years since quitting: 17.6  . Smokeless tobacco: Never Used  . Tobacco comment: started smoking in early 20s  Substance Use Topics  . Alcohol use: No    Alcohol/week: 0.0 standard drinks  . Drug use: No     Allergies   Prochlorperazine edisylate; Sulfamethoxazole; and Doxycycline   Review of Systems Review of Systems  Constitutional: Negative for fever.  Gastrointestinal: Positive for abdominal pain and constipation. Negative for vomiting.  Genitourinary: Negative for dysuria.  All other systems reviewed and are negative.    Physical Exam Updated Vital Signs BP 115/60   Pulse 88   Temp 97.6 F (36.4 C) (Oral)   Resp 16   Ht 1.651 m (5\' 5" )   Wt 65 kg   SpO2 98%   BMI 23.85 kg/m   Physical Exam CONSTITUTIONAL: chronically ill-appearing HEAD: Normocephalic/atraumatic EYES: EOMI/PERRL, no icterus ENMT: Mucous membranes moist NECK: supple no meningeal signs SPINE/BACK:entire spine nontender CV: S1/S2 noted, no murmurs/rubs/gallops noted LUNGS: Lungs are clear to auscultation bilaterally, no apparent distress ABDOMEN: soft, distended, tenderness to bilateral lower quadrants, no rebound or guarding GU:no cva tenderness NEURO: Pt is awake/alert/appropriate, moves all extremitiesx4.  No facial droop.   EXTREMITIES: pulses normal/equal, full ROM SKIN: warm, color normal PSYCH: no abnormalities of mood noted, alert and oriented to situation   ED Treatments / Results  Labs (all labs ordered are listed, but only abnormal results are displayed) Labs Reviewed  BASIC METABOLIC PANEL - Abnormal; Notable for the following components:      Result Value   Sodium 134 (*)    Calcium 8.5 (*)    All other components within normal limits  CBC WITH DIFFERENTIAL/PLATELET - Abnormal; Notable for the following components:   RBC 3.24 (*)    Hemoglobin 8.9 (*)    HCT 29.9 (*)    MCHC 29.8 (*)    RDW 19.0 (*)    Neutro Abs 8.4 (*)    Lymphs Abs 0.5  (*)    All other components within normal limits    EKG None  Radiology Ct Abdomen Pelvis W Contrast  Result Date: 08/01/2018 CLINICAL DATA:  Constipation for 1 week. History of colon cancer post partial colectomy. EXAM: CT ABDOMEN AND PELVIS WITH CONTRAST TECHNIQUE: Multidetector CT imaging of the abdomen and pelvis was performed using the standard protocol following bolus administration of intravenous contrast. CONTRAST:  148mL ISOVUE-300 IOPAMIDOL (ISOVUE-300) INJECTION 61% COMPARISON:  PET-CT 07/03/2018 FINDINGS: Lower chest: Consolidation with air bronchograms in the right lower lung, similar to previous study. Progression of consolidation in the left lower lung since previous study. Cardiac enlargement. Hepatobiliary: Diffuse fatty infiltration of the liver. No focal lesions identified. Gallbladder and bile ducts are unremarkable. Pancreas: Unremarkable. No pancreatic ductal dilatation or surrounding inflammatory changes. 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 No change. Adrenals/Urinary Tract: Adrenal glands are unremarkable. Kidneys are normal, without renal calculi, focal lesion, or hydronephrosis. Bladder is unremarkable. Stomach/Bowel: Stomach, small bowel, and colon are not abnormally distended. Stomach and small bowel are mostly decompressed. Stool-filled colon. Anastomosis at the ascending colon. Infiltration and edema around the rectum and in the presacral fat. This is new since previous study. Possibly postoperative or due to inflammatory process such as stercoral colitis. Vascular/Lymphatic: Aortic atherosclerosis. No enlarged abdominal or pelvic lymph nodes. Reproductive: Status post hysterectomy. No adnexal masses. Other: Small periumbilical hernia containing fat. Small left inguinal hernia containing fat. No free air or free fluid. Musculoskeletal: Degenerative changes in the spine. No destructive bone lesions.  IMPRESSION: 1. Progression of consolidation in the left lower lung since previous study. Persistent consolidation in the right lung base. 2. Diffuse fatty infiltration of the liver. 3. 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 No change since previous study. 4. Stool-filled colon. Infiltration and edema around the rectum and in the presacral fat. This may be postoperative or due to inflammatory process such as stercoral colitis. Aortic Atherosclerosis (ICD10-I70.0). Electronically Signed   By: Lucienne Capers M.D.   On: 08/01/2018 01:56    Procedures Fecal disimpaction Date/Time: 08/01/2018 3:44 AM Performed by: Ripley Fraise, MD Authorized by: Ripley Fraise, MD  Consent: Verbal consent obtained. Consent given by: patient Local anesthesia used: yes Anesthesia: local infiltration  Anesthesia: Local anesthesia used: yes Local anesthetic: lidocaine gel.  Sedation: Patient sedated: no  Patient tolerance: Patient tolerated the procedure well with no immediate complications Comments: Fecal disimpaction performed with nurse present.  Large amount of stool was extracted.  External hemorrhoids noted which made exam difficult small amount of bleeding noted but is controlled.       Medications Ordered in ED Medications  iopamidol (ISOVUE-300) 61 % injection (has no administration in time range)  sodium chloride 0.9 % injection (has no administration in time range)  fentaNYL (SUBLIMAZE) injection 100 mcg (100 mcg Intravenous Given 07/31/18 2347)  ondansetron (ZOFRAN) injection 4 mg (4 mg Intravenous Given 07/31/18 2344)  iopamidol (ISOVUE-300) 61 % injection 100 mL (100 mLs Intravenous Contrast Given 08/01/18 0054)  fentaNYL (SUBLIMAZE) injection 100 mcg (100 mcg Intravenous Given 08/01/18 0153)  lidocaine (XYLOCAINE) 2 % jelly 1 application (1 application Topical Given 08/01/18 0251)  lidocaine (XYLOCAINE) 2 % jelly 1 application (1  application Other Given 08/01/18 0334)  fentaNYL (SUBLIMAZE) injection 100 mcg (100 mcg Intravenous Given 08/01/18 0333)     Initial Impression / Assessment and Plan / ED Course  I have reviewed the triage vital signs and the nursing notes.  Pertinent labs  results that were available during my care of the patient were reviewed by me and considered in my medical decision making (  see chart for details).     11:56 PM Patient presents with constipation for over a week and diffuse abdominal pain.  She has a complicated history including colon cancer as well as stage IV lung CA, currently on immunotherapy Due to her pain and persistent constipation, will proceed with CT imaging of her abdomen pelvis.  Will defer fecal disimpaction at this time as she just had it done earlier today without success. 12:42 AM Patient resting comfortably, feels improved, awaiting CT imaging 3:46 AM CT imaging reveals significant constipation but no other acute findings.  I was able to perform a fecal disimpaction.  Next step will be to do an enema.  Patient reports abdominal discomfort, but abdominal exam is unchanged 5:35 AM Patient is much improved.  She has had a large bowel movement.  Her abdominal pain is improved.  She would like to be discharged home.  She will stop her iron for now until new labs are drawn as this may be causing constipation.  She has been using MiraLAX and Colace daily.  She is been encouraged to increase her fluid intake as well as fiber and fruits and vegetables. We will also stop her Pepcid for now. Discussed strict return precautions with patient and her family. Final Clinical Impressions(s) / ED Diagnoses   Final diagnoses:  Generalized abdominal pain  Slow transit constipation    ED Discharge Orders    None       Ripley Fraise, MD 08/01/18 [phone]

## 2018-08-01 ENCOUNTER — Encounter (HOSPITAL_COMMUNITY): Payer: Self-pay

## 2018-08-01 ENCOUNTER — Emergency Department (HOSPITAL_COMMUNITY): Payer: Medicare Other

## 2018-08-01 DIAGNOSIS — K5900 Constipation, unspecified: Secondary | ICD-10-CM | POA: Diagnosis not present

## 2018-08-01 DIAGNOSIS — K5901 Slow transit constipation: Secondary | ICD-10-CM | POA: Diagnosis not present

## 2018-08-01 LAB — BASIC METABOLIC PANEL
ANION GAP: 9 (ref 5–15)
BUN: 9 mg/dL (ref 8–23)
CO2: 26 mmol/L (ref 22–32)
Calcium: 8.5 mg/dL — ABNORMAL LOW (ref 8.9–10.3)
Chloride: 99 mmol/L (ref 98–111)
Creatinine, Ser: 0.5 mg/dL (ref 0.44–1.00)
GFR calc non Af Amer: >60 mL/min (ref >60)
GLUCOSE: 95 mg/dL (ref 70–99)
POTASSIUM: 4 mmol/L (ref 3.5–5.1)
Sodium: 134 mmol/L — ABNORMAL LOW (ref 135–145)

## 2018-08-01 MED ORDER — FENTANYL CITRATE (PF) 100 MCG/2ML IJ SOLN
100.0000 ug | Freq: Once | INTRAMUSCULAR | Status: AC
  Administered 2018-08-01: 100 ug via INTRAVENOUS
  Filled 2018-08-01: qty 2

## 2018-08-01 MED ORDER — LIDOCAINE HCL URETHRAL/MUCOSAL 2 % EX GEL
1.0000 "application " | Freq: Once | CUTANEOUS | Status: AC
  Administered 2018-08-01: 1
  Filled 2018-08-01: qty 5

## 2018-08-01 MED ORDER — IOPAMIDOL (ISOVUE-300) INJECTION 61%
100.0000 mL | Freq: Once | INTRAVENOUS | Status: AC | PRN
  Administered 2018-08-01: 100 mL via INTRAVENOUS

## 2018-08-01 MED ORDER — IOPAMIDOL (ISOVUE-300) INJECTION 61%
INTRAVENOUS | Status: AC
  Filled 2018-08-01: qty 100

## 2018-08-01 MED ORDER — SODIUM CHLORIDE 0.9 % IJ SOLN
INTRAMUSCULAR | Status: AC
  Filled 2018-08-01: qty 50

## 2018-08-01 MED ORDER — LIDOCAINE HCL URETHRAL/MUCOSAL 2 % EX GEL
1.0000 "application " | Freq: Once | CUTANEOUS | Status: AC
  Administered 2018-08-01: 1 via TOPICAL
  Filled 2018-08-01: qty 5

## 2018-08-01 MED ORDER — MINERAL OIL RE ENEM
1.0000 | ENEMA | Freq: Once | RECTAL | Status: DC
  Filled 2018-08-01: qty 1

## 2018-08-01 NOTE — ED Notes (Signed)
Patient having bowel movement, stool is soft. MD made aware

## 2018-08-01 NOTE — ED Notes (Signed)
Bed: EZ66 Expected date:  Expected time:  Means of arrival:  Comments: Room 7

## 2018-08-03 ENCOUNTER — Encounter: Payer: Self-pay | Admitting: *Deleted

## 2018-08-03 NOTE — Progress Notes (Signed)
[place] Work  Clinical Social Work was referred by medical oncology RN for assessment of psychosocial needs.  Clinical Social Worker contacted caregiver by phone  to offer support and assess for needs.  Patient's daughter reported her mother is at home and has chosen to continue treatment.  They have a home care service coming daily to help with ambulation and care needs and [facility] comes weekly for vitals.  CSW reviewed available services and encouraged patient's daughter to call if she has any other questions.      Gwinda Maine, LCSW  Clinical Social Worker [facility]

## 2018-08-04 DIAGNOSIS — I10 Essential (primary) hypertension: Secondary | ICD-10-CM | POA: Diagnosis not present

## 2018-08-04 DIAGNOSIS — D649 Anemia, unspecified: Secondary | ICD-10-CM | POA: Diagnosis not present

## 2018-08-04 DIAGNOSIS — C3431 Malignant neoplasm of lower lobe, right bronchus or lung: Secondary | ICD-10-CM | POA: Diagnosis not present

## 2018-08-04 DIAGNOSIS — J449 Chronic obstructive pulmonary disease, unspecified: Secondary | ICD-10-CM | POA: Diagnosis not present

## 2018-08-04 DIAGNOSIS — L930 Discoid lupus erythematosus: Secondary | ICD-10-CM | POA: Diagnosis not present

## 2018-08-04 DIAGNOSIS — J9621 Acute and chronic respiratory failure with hypoxia: Secondary | ICD-10-CM | POA: Diagnosis not present

## 2018-08-05 ENCOUNTER — Encounter: Payer: Self-pay | Admitting: *Deleted

## 2018-08-05 ENCOUNTER — Inpatient Hospital Stay: Payer: Medicare Other | Attending: Internal Medicine

## 2018-08-05 ENCOUNTER — Telehealth: Payer: Self-pay | Admitting: Oncology

## 2018-08-05 ENCOUNTER — Encounter: Payer: Self-pay | Admitting: Internal Medicine

## 2018-08-05 ENCOUNTER — Inpatient Hospital Stay (HOSPITAL_BASED_OUTPATIENT_CLINIC_OR_DEPARTMENT_OTHER): Payer: Medicare Other | Admitting: Internal Medicine

## 2018-08-05 ENCOUNTER — Inpatient Hospital Stay: Payer: Medicare Other

## 2018-08-05 ENCOUNTER — Other Ambulatory Visit: Payer: Self-pay | Admitting: *Deleted

## 2018-08-05 VITALS — BP 111/60 | HR 112 | Temp 97.6°F | Resp 20 | Ht 65.0 in | Wt 151.2 lb

## 2018-08-05 DIAGNOSIS — I10 Essential (primary) hypertension: Secondary | ICD-10-CM

## 2018-08-05 DIAGNOSIS — K4090 Unilateral inguinal hernia, without obstruction or gangrene, not specified as recurrent: Secondary | ICD-10-CM | POA: Diagnosis not present

## 2018-08-05 DIAGNOSIS — Z5111 Encounter for antineoplastic chemotherapy: Secondary | ICD-10-CM | POA: Diagnosis not present

## 2018-08-05 DIAGNOSIS — Z85038 Personal history of other malignant neoplasm of large intestine: Secondary | ICD-10-CM

## 2018-08-05 DIAGNOSIS — I2510 Atherosclerotic heart disease of native coronary artery without angina pectoris: Secondary | ICD-10-CM | POA: Insufficient documentation

## 2018-08-05 DIAGNOSIS — C3491 Malignant neoplasm of unspecified part of right bronchus or lung: Secondary | ICD-10-CM

## 2018-08-05 DIAGNOSIS — Z9049 Acquired absence of other specified parts of digestive tract: Secondary | ICD-10-CM | POA: Diagnosis not present

## 2018-08-05 DIAGNOSIS — Z79899 Other long term (current) drug therapy: Secondary | ICD-10-CM

## 2018-08-05 DIAGNOSIS — K5900 Constipation, unspecified: Secondary | ICD-10-CM | POA: Insufficient documentation

## 2018-08-05 DIAGNOSIS — C3431 Malignant neoplasm of lower lobe, right bronchus or lung: Secondary | ICD-10-CM | POA: Insufficient documentation

## 2018-08-05 DIAGNOSIS — Z5112 Encounter for antineoplastic immunotherapy: Secondary | ICD-10-CM

## 2018-08-05 DIAGNOSIS — R5382 Chronic fatigue, unspecified: Secondary | ICD-10-CM

## 2018-08-05 DIAGNOSIS — J449 Chronic obstructive pulmonary disease, unspecified: Secondary | ICD-10-CM | POA: Insufficient documentation

## 2018-08-05 DIAGNOSIS — M1990 Unspecified osteoarthritis, unspecified site: Secondary | ICD-10-CM | POA: Insufficient documentation

## 2018-08-05 DIAGNOSIS — I700 Atherosclerosis of aorta: Secondary | ICD-10-CM | POA: Insufficient documentation

## 2018-08-05 DIAGNOSIS — L930 Discoid lupus erythematosus: Secondary | ICD-10-CM | POA: Diagnosis not present

## 2018-08-05 DIAGNOSIS — K760 Fatty (change of) liver, not elsewhere classified: Secondary | ICD-10-CM | POA: Diagnosis not present

## 2018-08-05 DIAGNOSIS — K429 Umbilical hernia without obstruction or gangrene: Secondary | ICD-10-CM | POA: Diagnosis not present

## 2018-08-05 LAB — CMP (CANCER CENTER ONLY)
ALBUMIN: 2.1 g/dL — AB (ref 3.5–5.0)
ALT: 12 U/L (ref 0–44)
AST: 25 U/L (ref 15–41)
Alkaline Phosphatase: 161 U/L — ABNORMAL HIGH (ref 38–126)
Anion gap: 8 (ref 5–15)
BUN: 14 mg/dL (ref 8–23)
CALCIUM: 8.4 mg/dL — AB (ref 8.9–10.3)
CO2: 28 mmol/L (ref 22–32)
Chloride: 101 mmol/L (ref 98–111)
Creatinine: 0.69 mg/dL (ref 0.44–1.00)
Glucose, Bld: 100 mg/dL — ABNORMAL HIGH (ref 70–99)
Potassium: 4 mmol/L (ref 3.5–5.1)
SODIUM: 137 mmol/L (ref 135–145)
Total Bilirubin: 0.5 mg/dL (ref 0.3–1.2)
Total Protein: 6.8 g/dL (ref 6.5–8.1)

## 2018-08-05 LAB — CBC WITH DIFFERENTIAL (CANCER CENTER ONLY)
Abs Immature Granulocytes: 0.05 10*3/uL (ref 0.00–0.07)
BASOS ABS: 0 10*3/uL (ref 0.0–0.1)
Basophils Relative: 1 %
Eosinophils Absolute: 0.2 10*3/uL (ref 0.0–0.5)
Eosinophils Relative: 3 %
HEMATOCRIT: 29.2 % — AB (ref 36.0–46.0)
HEMOGLOBIN: 8.6 g/dL — AB (ref 12.0–15.0)
IMMATURE GRANULOCYTES: 1 %
LYMPHS ABS: 0.5 10*3/uL — AB (ref 0.7–4.0)
Lymphocytes Relative: 8 %
MCH: 27.4 pg (ref 26.0–34.0)
MCHC: 29.5 g/dL — AB (ref 30.0–36.0)
MCV: 93 fL (ref 80.0–100.0)
MONO ABS: 0.6 10*3/uL (ref 0.1–1.0)
Monocytes Relative: 9 %
NRBC: 0 % (ref 0.0–0.2)
Neutro Abs: 5 10*3/uL (ref 1.7–7.7)
Neutrophils Relative %: 78 %
Platelet Count: 237 10*3/uL (ref 150–400)
RBC: 3.14 MIL/uL — ABNORMAL LOW (ref 3.87–5.11)
RDW: 19.5 % — ABNORMAL HIGH (ref 11.5–15.5)
WBC: 6.4 10*3/uL (ref 4.0–10.5)

## 2018-08-05 LAB — TSH: TSH: 2.863 u[IU]/mL (ref 0.308–3.960)

## 2018-08-05 MED ORDER — SODIUM CHLORIDE 0.9 % IV SOLN
Freq: Once | INTRAVENOUS | Status: AC
  Administered 2018-08-05: 10:00:00 via INTRAVENOUS
  Filled 2018-08-05: qty 250

## 2018-08-05 MED ORDER — PROCHLORPERAZINE MALEATE 10 MG PO TABS: 10.0000 mg | ORAL_TABLET | Freq: Once | ORAL | Status: DC

## 2018-08-05 MED ORDER — SODIUM CHLORIDE 0.9 % IV SOLN
200.0000 mg | Freq: Once | INTRAVENOUS | Status: AC
  Administered 2018-08-05: 200 mg via INTRAVENOUS
  Filled 2018-08-05: qty 8

## 2018-08-05 NOTE — Progress Notes (Signed)
Correction to lab collection date for [facility]. Correct date is 08/26/18. Mauri Reading Michaelle Copas, Therapist, sports, Product/process development scientist

## 2018-08-05 NOTE — Patient Instructions (Signed)
[facility] Discharge Instructions for Patients Receiving Chemotherapy  Today you received the following chemotherapy agents:  Keytruda.  To help prevent nausea and vomiting after your treatment, we encourage you to take your nausea medication as directed.   If you develop nausea and vomiting that is not controlled by your nausea medication, call the clinic.   BELOW ARE SYMPTOMS THAT SHOULD BE REPORTED IMMEDIATELY:  *FEVER GREATER THAN 100.5 F  *CHILLS WITH OR WITHOUT FEVER  NAUSEA AND VOMITING THAT IS NOT CONTROLLED WITH YOUR NAUSEA MEDICATION  *UNUSUAL SHORTNESS OF BREATH  *UNUSUAL BRUISING OR BLEEDING  TENDERNESS IN MOUTH AND THROAT WITH OR WITHOUT PRESENCE OF ULCERS  *URINARY PROBLEMS  *BOWEL PROBLEMS  UNUSUAL RASH Items with * indicate a potential emergency and should be followed up as soon as possible.  Feel free to call the clinic should you have any questions or concerns. The clinic phone number is [phone].  Please show the CHEMO ALERT CARD at check-in to the Emergency Department and triage nurse.    

## 2018-08-05 NOTE — Telephone Encounter (Signed)
3 cycles already scheduled per 10/16 los - no additional appts added.

## 2018-08-05 NOTE — Progress Notes (Signed)
[facility] [phone]   [phone]  OFFICE PROGRESS NOTE  Shon Baton, MD [place] [address]  DIAGNOSIS: unresectable stage IIIA/IV non-small cell lung cancer, adenocarcinoma diagnosed in July 2019 presented with right lower lobe lung mass as well as right pleural effusion and a small left lower lobe lung nodules. Molecular studies showed positive RET rearrangement and PDL 1 expression of 5%.  PRIOR THERAPY: None  CURRENT THERAPY: First-line treatment with immunotherapy with Keytruda 200 mg IV every 3 weeks.  First dose 07/15/2018.  Status post 1 cycle.  INTERVAL HISTORY: Casey Wilkinson 77 y.o. female returns to the clinic today for follow-up visit accompanied by her husband.  The patient is feeling fine today with no concerning complaints except for constipation.  She was seen at the emergency department recently for fecal disimpaction.  She is currently on MiraLAX as well as a stool softener.  She denied having any nausea, vomiting or diarrhea.  She denied having any chest pain, shortness of breath, cough or hemoptysis.  She tolerated the first dose of her treatment with Keytruda fairly well.  The patient is here today for evaluation before starting cycle #2.  MEDICAL HISTORY: Past Medical History:  Diagnosis Date  . Anemia   . Arthritis    "all over"  . Carotid artery stenosis    right side followed by [facility]- 65%  . Colitis April 2016  . Colon cancer ([place])   . COPD (chronic obstructive pulmonary disease) ([place])   . Discoid lupus erythematosus   . Diverticulitis April 2016   and Colitis  . History of blood transfusion 01/2015, 06/2015   colectomy  . Hypertension   . Psoriatic arthritis ([place])   . Shortness of breath dyspnea    with exertion  . Squamous cell carcinoma, leg     ALLERGIES:  is allergic to prochlorperazine edisylate; sulfamethoxazole; and doxycycline.  MEDICATIONS:  Current Outpatient Medications    Medication Sig Dispense Refill  . acetaminophen (TYLENOL) 325 MG tablet Take 1,300 mg by mouth every 8 (eight) hours as needed for mild pain.    Marland Kitchen albuterol (PROAIR HFA) 108 (90 Base) MCG/ACT inhaler Inhale 2 puffs into the lungs every 4 (four) hours as needed for wheezing or shortness of breath. 3 Inhaler 1  . atorvastatin (LIPITOR) 10 MG tablet Take 10 mg by mouth daily.    . Calcium Carbonate-Vitamin D 600-400 MG-UNIT per tablet Take 1 tablet by mouth every morning.     . Cyanocobalamin (B-12) 2500 MCG TABS Take 2,500 mcg by mouth daily.    Marland Kitchen docusate sodium (COLACE) 100 MG capsule Take 100 mg by mouth at bedtime.     . feeding supplement, ENSURE ENLIVE, (ENSURE ENLIVE) LIQD Take 237 mLs by mouth 3 (three) times daily between meals. 660 mL 12  . folic acid (FOLVITE) 1 MG tablet Take 1 mg by mouth every morning.     Marland Kitchen guaiFENesin (MUCINEX) 600 MG 12 hr tablet Take 600 mg by mouth 2 (two) times daily as needed for cough.    Marland Kitchen HYDROcodone-acetaminophen (NORCO/VICODIN) 5-325 MG tablet Take 1 tablet by mouth every 6 (six) hours as needed for moderate pain.    Marland Kitchen HYDROcodone-homatropine (HYCODAN) 5-1.5 MG/5ML syrup Take 5 mLs by mouth at bedtime and may repeat dose one time if needed. 120 mL 0  . ipratropium-albuterol (DUONEB) 0.5-2.5 (3) MG/3ML SOLN Take 3 mLs by nebulization every 4 (four) hours as needed (shortness of breath/wheezing).  360 mL 0  . iron polysaccharides (NIFEREX) 150 MG capsule Take 150 mg by mouth daily.    Marland Kitchen lidocaine (XYLOCAINE) 2 % solution caregiver: Mix 1part 2% viscous lidocaine, 1part H20. Swallow 19m of diluted mixture, 348m before meals and at bedtime, up to QID (Patient not taking: Reported on 08/01/2018) 150 mL 2  . loratadine (CLARITIN) 10 MG tablet Take 10 mg by mouth daily.    . Marland KitchenORazepam (ATIVAN) 0.5 MG tablet Take 0.5 mg by mouth 2 (two) times daily as needed for anxiety.    . metoprolol tartrate (LOPRESSOR) 25 MG tablet Take 1 tablet (25 mg total) by mouth 2 (two)  times daily. 60 tablet 0  . mirtazapine (REMERON) 7.5 MG tablet Take 1 tablet (7.5 mg total) by mouth at bedtime. 10 tablet 0  . Multiple Vitamin (MULTIVITAMIN WITH MINERALS) TABS tablet Take 1 tablet by mouth daily.    . ondansetron (ZOFRAN) 4 MG tablet Take 4 mg by mouth every 6 (six) hours as needed for nausea or vomiting.    . polyethylene glycol (MIRALAX / GLYCOLAX) packet Take 17 g by mouth daily as needed for mild constipation.     . Potassium 99 MG TABS Take 99 mg by mouth daily.    . sennosides-docusate sodium (SENOKOT-S) 8.6-50 MG tablet Take 1 tablet by mouth daily.    . traMADol (ULTRAM) 50 MG tablet Take 1 tablet (50 mg total) by mouth every 6 (six) hours as needed for moderate pain. 14 tablet 0  . TRELEGY ELLIPTA 100-62.5-25 MCG/INH AEPB USE 1 INHALATION DAILY (Patient taking differently: Inhale 1 puff into the lungs daily. ) 180 each 1  . VITAMIN D, ERGOCALCIFEROL, PO Take 1 capsule by mouth daily.     No current facility-administered medications for this visit.     SURGICAL HISTORY:  Past Surgical History:  Procedure Laterality Date  . APPENDECTOMY  06/2015  . CATARACT EXTRACTION W/ INTRAOCULAR LENS  IMPLANT, BILATERAL Bilateral 2017  . COLONOSCOPY  06/23/15  . COLONOSCOPY W/ POLYPECTOMY    . CYSTOCELE REPAIR  2006  . DILATION AND CURETTAGE OF UTERUS  X 2  . EXCISIONAL HEMORRHOIDECTOMY  2006  . JOINT REPLACEMENT    . LAPAROSCOPIC PARTIAL COLECTOMY N/A 06/23/2015   Procedure: LAPAROSCOPIC ILEOCECETOMY;  Surgeon: BeExcell SeltzerMD;  Location: [facility];  Service: General;  Laterality: N/A;  . MOHS SURGERY Left    "basal; LLE"  . RECTOCELE REPAIR  2006  . SQUAMOUS CELL CARCINOMA EXCISION  "several"   "legs mostly"  . TOTAL KNEE ARTHROPLASTY Left 01/31/2016   Procedure: LEFT TOTAL KNEE ARTHROPLASTY;  Surgeon: NaLeandrew KoyanagiMD;  [facility];  Laterality: Left;  . Marland KitchenAGINAL HYSTERECTOMY  1968   "total"  . VIDEO BRONCHOSCOPY Bilateral 05/06/2018    Procedure: VIDEO BRONCHOSCOPY WITH FLUORO;  Surgeon: ByCollene GobbleMD;  [facility];  Service: Cardiopulmonary;  Laterality: Bilateral;    REVIEW OF SYSTEMS:  A comprehensive review of systems was negative except for: Gastrointestinal: positive for constipation   PHYSICAL EXAMINATION: General appearance: alert, cooperative, fatigued and no distress Head: Normocephalic, without obvious abnormality, atraumatic Neck: no adenopathy, no JVD, supple, symmetrical, trachea midline and thyroid not enlarged, symmetric, no tenderness/mass/nodules Lymph nodes: Cervical, supraclavicular, and axillary nodes normal. Resp: wheezes bilaterally Back: symmetric, no curvature. ROM normal. No CVA tenderness. Cardio: regular rate and rhythm, S1, S2 normal, no murmur, click, rub or gallop GI: soft, non-tender; bowel sounds normal; no masses,  no organomegaly  Extremities: extremities normal, atraumatic, no cyanosis or edema  ECOG PERFORMANCE STATUS: 1 - Symptomatic but completely ambulatory  Blood pressure 111/60, pulse (!) 112, temperature 97.6 F (36.4 C), temperature source Oral, resp. rate 20, height '5\' 5"'$  (1.651 m), weight 151 lb 3.2 oz (68.6 kg), SpO2 94 %.  LABORATORY DATA: Lab Results  Component Value Date   WBC 6.4 08/05/2018   HGB 8.6 (L) 08/05/2018   HCT 29.2 (L) 08/05/2018   MCV 93.0 08/05/2018   PLT 237 08/05/2018      Chemistry      Component Value Date/Time   NA 134 (L) 07/31/2018 2348   K 4.0 07/31/2018 2348   CL 99 07/31/2018 2348   CO2 26 07/31/2018 2348   BUN 9 07/31/2018 2348   CREATININE 0.50 07/31/2018 2348   CREATININE 0.67 07/15/2018 0849      Component Value Date/Time   CALCIUM 8.5 (L) 07/31/2018 2348   ALKPHOS 131 (H) 07/15/2018 0849   AST 28 07/15/2018 0849   ALT 15 07/15/2018 0849   BILITOT 0.4 07/15/2018 0849       RADIOGRAPHIC STUDIES: Ct Abdomen Pelvis W Contrast  Result Date: 08/01/2018 CLINICAL DATA:  Constipation for 1 week. History of  colon cancer post partial colectomy. EXAM: CT ABDOMEN AND PELVIS WITH CONTRAST TECHNIQUE: Multidetector CT imaging of the abdomen and pelvis was performed using the standard protocol following bolus administration of intravenous contrast. CONTRAST:  14m ISOVUE-300 IOPAMIDOL (ISOVUE-300) INJECTION 61% COMPARISON:  PET-CT 07/03/2018 FINDINGS: Lower chest: Consolidation with air bronchograms in the right lower lung, similar to previous study. Progression of consolidation in the left lower lung since previous study. Cardiac enlargement. Hepatobiliary: Diffuse fatty infiltration of the liver. No focal lesions identified. Gallbladder and bile ducts are unremarkable. Pancreas: Unremarkable. No pancreatic ductal dilatation or surrounding inflammatory changes. 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 No change. Adrenals/Urinary Tract: Adrenal glands are unremarkable. Kidneys are normal, without renal calculi, focal lesion, or hydronephrosis. Bladder is unremarkable. Stomach/Bowel: Stomach, small bowel, and colon are not abnormally distended. Stomach and small bowel are mostly decompressed. Stool-filled colon. Anastomosis at the ascending colon. Infiltration and edema around the rectum and in the presacral fat. This is new since previous study. Possibly postoperative or due to inflammatory process such as stercoral colitis. Vascular/Lymphatic: Aortic atherosclerosis. No enlarged abdominal or pelvic lymph nodes. Reproductive: Status post hysterectomy. No adnexal masses. Other: Small periumbilical hernia containing fat. Small left inguinal hernia containing fat. No free air or free fluid. Musculoskeletal: Degenerative changes in the spine. No destructive bone lesions. IMPRESSION: 1. Progression of consolidation in the left lower lung since previous study. Persistent consolidation in the right lung base. 2. Diffuse fatty infiltration of the liver. 3. 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 No change since previous study. 4. Stool-filled colon. Infiltration and edema around the rectum and in the presacral fat. This may be postoperative or due to inflammatory process such as stercoral colitis. Aortic Atherosclerosis (ICD10-I70.0). Electronically Signed   By: WLucienne CapersM.D.   On: 08/01/2018 01:56    ASSESSMENT AND PLAN: This is a very pleasant 77years old white female with stage IV non-small cell lung cancer, adenocarcinoma with positive RET mutation and PDL 1 expression of 5%. The patient was a started on treatment with immunotherapy with single agent Ketruda (pembrolizumab) status post 1 cycle.  She tolerated the first cycle of her treatment well. I recommended for  the patient to proceed with cycle #2 today as scheduled. I will see her back for follow-up visit in 3 weeks for evaluation before starting cycle #3. For constipation she will continue her current treatment with MiraLAX and stool softener. She was advised to call immediately if she has any concerning symptoms in the interval. The patient voices understanding of current disease status and treatment options and is in agreement with the current care plan.  All questions were answered. The patient knows to call the clinic with any problems, questions or concerns. We can certainly see the patient much sooner if necessary.  Disclaimer: This note was dictated with voice recognition software. Similar sounding words can inadvertently be transcribed and may not be corrected upon review.

## 2018-08-05 NOTE — Progress Notes (Signed)
Met with patient in infusion to introduce myself as Arboriculturist and to ask if she has any financial questions or concerns. She states she does not and has good insurance. Patient does have dual coverage.  Discussed the one-time $500 [place] to assist with personal expenses during treatment. Advised her of the household income and she and her spouse state they are well over the limit. Gave my card for any additional financial questions or concerns.

## 2018-08-06 DIAGNOSIS — C3431 Malignant neoplasm of lower lobe, right bronchus or lung: Secondary | ICD-10-CM | POA: Diagnosis not present

## 2018-08-06 DIAGNOSIS — J9621 Acute and chronic respiratory failure with hypoxia: Secondary | ICD-10-CM | POA: Diagnosis not present

## 2018-08-06 DIAGNOSIS — J449 Chronic obstructive pulmonary disease, unspecified: Secondary | ICD-10-CM | POA: Diagnosis not present

## 2018-08-06 DIAGNOSIS — I10 Essential (primary) hypertension: Secondary | ICD-10-CM | POA: Diagnosis not present

## 2018-08-06 DIAGNOSIS — D649 Anemia, unspecified: Secondary | ICD-10-CM | POA: Diagnosis not present

## 2018-08-06 DIAGNOSIS — L930 Discoid lupus erythematosus: Secondary | ICD-10-CM | POA: Diagnosis not present

## 2018-08-10 DIAGNOSIS — C3431 Malignant neoplasm of lower lobe, right bronchus or lung: Secondary | ICD-10-CM | POA: Diagnosis not present

## 2018-08-10 DIAGNOSIS — L930 Discoid lupus erythematosus: Secondary | ICD-10-CM | POA: Diagnosis not present

## 2018-08-10 DIAGNOSIS — J9621 Acute and chronic respiratory failure with hypoxia: Secondary | ICD-10-CM | POA: Diagnosis not present

## 2018-08-10 DIAGNOSIS — I10 Essential (primary) hypertension: Secondary | ICD-10-CM | POA: Diagnosis not present

## 2018-08-10 DIAGNOSIS — J449 Chronic obstructive pulmonary disease, unspecified: Secondary | ICD-10-CM | POA: Diagnosis not present

## 2018-08-10 DIAGNOSIS — D649 Anemia, unspecified: Secondary | ICD-10-CM | POA: Diagnosis not present

## 2018-08-11 DIAGNOSIS — J449 Chronic obstructive pulmonary disease, unspecified: Secondary | ICD-10-CM | POA: Diagnosis not present

## 2018-08-11 DIAGNOSIS — I10 Essential (primary) hypertension: Secondary | ICD-10-CM | POA: Diagnosis not present

## 2018-08-11 DIAGNOSIS — J9621 Acute and chronic respiratory failure with hypoxia: Secondary | ICD-10-CM | POA: Diagnosis not present

## 2018-08-11 DIAGNOSIS — C3431 Malignant neoplasm of lower lobe, right bronchus or lung: Secondary | ICD-10-CM | POA: Diagnosis not present

## 2018-08-11 DIAGNOSIS — D649 Anemia, unspecified: Secondary | ICD-10-CM | POA: Diagnosis not present

## 2018-08-11 DIAGNOSIS — L930 Discoid lupus erythematosus: Secondary | ICD-10-CM | POA: Diagnosis not present

## 2018-08-13 ENCOUNTER — Encounter (HOSPITAL_COMMUNITY): Payer: Medicare Other

## 2018-08-13 ENCOUNTER — Emergency Department (HOSPITAL_COMMUNITY): Payer: Medicare Other

## 2018-08-13 ENCOUNTER — Emergency Department (HOSPITAL_COMMUNITY)
Admission: EM | Admit: 2018-08-13 | Discharge: 2018-08-13 | Disposition: A | Discharge status: Home / Self Care | Payer: Medicare Other | Attending: Emergency Medicine | Admitting: Emergency Medicine

## 2018-08-13 ENCOUNTER — Other Ambulatory Visit: Payer: Self-pay

## 2018-08-13 ENCOUNTER — Ambulatory Visit: Payer: Medicare Other | Admitting: Family

## 2018-08-13 ENCOUNTER — Encounter (HOSPITAL_COMMUNITY): Payer: Self-pay | Admitting: Emergency Medicine

## 2018-08-13 DIAGNOSIS — Z87891 Personal history of nicotine dependence: Secondary | ICD-10-CM | POA: Insufficient documentation

## 2018-08-13 DIAGNOSIS — I739 Peripheral vascular disease, unspecified: Secondary | ICD-10-CM | POA: Diagnosis not present

## 2018-08-13 DIAGNOSIS — Z79899 Other long term (current) drug therapy: Secondary | ICD-10-CM | POA: Insufficient documentation

## 2018-08-13 DIAGNOSIS — R000 Tachycardia, unspecified: Secondary | ICD-10-CM | POA: Diagnosis not present

## 2018-08-13 DIAGNOSIS — I959 Hypotension, unspecified: Secondary | ICD-10-CM | POA: Diagnosis not present

## 2018-08-13 DIAGNOSIS — L930 Discoid lupus erythematosus: Secondary | ICD-10-CM | POA: Insufficient documentation

## 2018-08-13 DIAGNOSIS — Z85118 Personal history of other malignant neoplasm of bronchus and lung: Secondary | ICD-10-CM | POA: Diagnosis not present

## 2018-08-13 DIAGNOSIS — R10813 Right lower quadrant abdominal tenderness: Secondary | ICD-10-CM | POA: Diagnosis not present

## 2018-08-13 DIAGNOSIS — Z85828 Personal history of other malignant neoplasm of skin: Secondary | ICD-10-CM | POA: Diagnosis not present

## 2018-08-13 DIAGNOSIS — J449 Chronic obstructive pulmonary disease, unspecified: Secondary | ICD-10-CM | POA: Diagnosis not present

## 2018-08-13 DIAGNOSIS — I10 Essential (primary) hypertension: Secondary | ICD-10-CM | POA: Insufficient documentation

## 2018-08-13 DIAGNOSIS — K449 Diaphragmatic hernia without obstruction or gangrene: Secondary | ICD-10-CM | POA: Diagnosis not present

## 2018-08-13 DIAGNOSIS — J9621 Acute and chronic respiratory failure with hypoxia: Secondary | ICD-10-CM | POA: Diagnosis not present

## 2018-08-13 DIAGNOSIS — R0902 Hypoxemia: Secondary | ICD-10-CM | POA: Diagnosis not present

## 2018-08-13 DIAGNOSIS — Z85038 Personal history of other malignant neoplasm of large intestine: Secondary | ICD-10-CM | POA: Diagnosis not present

## 2018-08-13 DIAGNOSIS — R110 Nausea: Secondary | ICD-10-CM | POA: Diagnosis not present

## 2018-08-13 DIAGNOSIS — R55 Syncope and collapse: Secondary | ICD-10-CM | POA: Diagnosis not present

## 2018-08-13 DIAGNOSIS — D649 Anemia, unspecified: Secondary | ICD-10-CM | POA: Diagnosis not present

## 2018-08-13 DIAGNOSIS — C3431 Malignant neoplasm of lower lobe, right bronchus or lung: Secondary | ICD-10-CM | POA: Diagnosis not present

## 2018-08-13 LAB — CBC
HEMATOCRIT: 29 % — AB (ref 36.0–46.0)
Hemoglobin: 8.5 g/dL — ABNORMAL LOW (ref 12.0–15.0)
MCH: 27.5 pg (ref 26.0–34.0)
MCHC: 29.3 g/dL — ABNORMAL LOW (ref 30.0–36.0)
MCV: 93.9 fL (ref 80.0–100.0)
PLATELETS: 243 10*3/uL (ref 150–400)
RBC: 3.09 MIL/uL — AB (ref 3.87–5.11)
RDW: 19.1 % — ABNORMAL HIGH (ref 11.5–15.5)
WBC: 7.6 10*3/uL (ref 4.0–10.5)
nRBC: 0 % (ref 0.0–0.2)

## 2018-08-13 LAB — URINALYSIS, ROUTINE W REFLEX MICROSCOPIC
Bilirubin Urine: NEGATIVE
GLUCOSE, UA: NEGATIVE mg/dL
HGB URINE DIPSTICK: NEGATIVE
Ketones, ur: NEGATIVE mg/dL
Leukocytes, UA: NEGATIVE
Nitrite: NEGATIVE
PH: 7 (ref 5.0–8.0)
PROTEIN: NEGATIVE mg/dL
Specific Gravity, Urine: 1.016 (ref 1.005–1.030)

## 2018-08-13 LAB — BASIC METABOLIC PANEL
Anion gap: 8 (ref 5–15)
BUN: 15 mg/dL (ref 8–23)
CALCIUM: 8.8 mg/dL — AB (ref 8.9–10.3)
CO2: 29 mmol/L (ref 22–32)
CREATININE: 0.66 mg/dL (ref 0.44–1.00)
Chloride: 100 mmol/L (ref 98–111)
GFR calc Af Amer: >60 mL/min (ref >60)
GFR calc non Af Amer: >60 mL/min (ref >60)
GLUCOSE: 116 mg/dL — AB (ref 70–99)
POTASSIUM: 4.1 mmol/L (ref 3.5–5.1)
SODIUM: 137 mmol/L (ref 135–145)

## 2018-08-13 LAB — CBG MONITORING, ED: Glucose-Capillary: 104 mg/dL — ABNORMAL HIGH (ref 70–99)

## 2018-08-13 MED ORDER — SODIUM CHLORIDE 0.9 % IV BOLUS
500.0000 mL | Freq: Once | INTRAVENOUS | Status: AC
  Administered 2018-08-13: 500 mL via INTRAVENOUS

## 2018-08-13 MED ORDER — SODIUM CHLORIDE 0.9 % IV SOLN
INTRAVENOUS | Status: DC
  Administered 2018-08-13: 14:00:00 via INTRAVENOUS

## 2018-08-13 MED ORDER — IOPAMIDOL (ISOVUE-300) INJECTION 61%
100.0000 mL | Freq: Once | INTRAVENOUS | Status: AC | PRN
  Administered 2018-08-13: 100 mL via INTRAVENOUS

## 2018-08-13 MED ORDER — IOPAMIDOL (ISOVUE-300) INJECTION 61%
INTRAVENOUS | Status: AC
  Filled 2018-08-13: qty 100

## 2018-08-13 MED ORDER — SODIUM CHLORIDE 0.9 % IJ SOLN
INTRAMUSCULAR | Status: AC
  Filled 2018-08-13: qty 50

## 2018-08-13 NOTE — ED Provider Notes (Signed)
[facility] DEPT Provider Note   CSN: [id number] Arrival date & time: 08/13/18  1119     History   Chief Complaint Chief Complaint  Patient presents with  . Loss of Consciousness    HPI Casey Wilkinson is a 77 y.o. female.  HPI   Patient was with family members today when she suddenly lost consciousness.  She was sitting in a chair getting her haircut.  She leaned over against her knees and was unconscious for 2 to 3 minutes.  She did not fall.  These noticed that she was drooling.  After that patient suddenly woke up and vomited, but did not choke.  After that she slowly aroused and within 5 minutes was back to normal.  No other preceding symptoms or abnormal findings.  She ate well earlier today.  She has not had any recent fever, chills, cough, shortness of breath, chest pain, weakness or dizziness.  She is currently getting immunotherapy, for cancer.  There are no other known modifying factors.  Past Medical History:  Diagnosis Date  . Anemia   . Arthritis    "all over"  . Carotid artery stenosis    right side followed by [facility]- 65%  . Colitis April 2016  . Colon cancer ([place])   . COPD (chronic obstructive pulmonary disease) ([place])   . Discoid lupus erythematosus   . Diverticulitis April 2016   and Colitis  . History of blood transfusion 01/2015, 06/2015   colectomy  . Hypertension   . Psoriatic arthritis ([place])   . Shortness of breath dyspnea    with exertion  . Squamous cell carcinoma, leg     Patient Active Problem List   Diagnosis Date Noted  . Encounter for antineoplastic immunotherapy 07/12/2018  . Adenocarcinoma of right lung, stage 4 ([place]) 07/10/2018  . Anemia 06/14/2018  . Hypoxia 06/14/2018  . Goals of care, counseling/discussion 06/07/2018  . HCAP (healthcare-associated pneumonia) 05/12/2018  . Adenocarcinoma of right lung ([place])   . Sepsis due to pneumonia ([place]) 05/06/2018  . S/P bronchoscopy with biopsy   . Pleuritic  pain 04/30/2018  . Hemoptysis 04/30/2018  . Right lower lobe pneumonia ([place]) 03/31/2018  . Chronic rhinitis 11/04/2017  . Pain and swelling of right lower leg 05/05/2017  . Acute on chronic respiratory failure with hypoxia ([place]) 03/12/2017  . Pain of right hand 01/28/2017  . 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 Degenerative arthritis of left knee 01/31/2016  . 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 Nausea with vomiting   . Acute ischemic colitis ([place])   . Rectal bleeding   . Abdominal pain   . Blood in stool   . 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 Colitis 02/06/2015  . Abdominal mass 02/06/2015  . HTN (hypertension) 02/06/2015  . Peripheral vascular disease ([place]) 02/24/2014  . LUPUS ERYTHEMATOSUS, DISCOID 11/21/2009  . Essential hypertension 12/08/2008  . COPD (chronic obstructive pulmonary disease) ([place]) 12/08/2008    Past Surgical History:  Procedure Laterality Date  . APPENDECTOMY  06/2015  . CATARACT EXTRACTION W/ INTRAOCULAR LENS  IMPLANT, BILATERAL Bilateral 2017  . COLONOSCOPY  06/23/15  . COLONOSCOPY W/ POLYPECTOMY    . CYSTOCELE REPAIR  2006  . DILATION AND CURETTAGE OF UTERUS  X 2  . EXCISIONAL HEMORRHOIDECTOMY  2006  . JOINT REPLACEMENT    . LAPAROSCOPIC PARTIAL COLECTOMY N/A 06/23/2015   Procedure: LAPAROSCOPIC ILEOCECETOMY;  Surgeon: Excell Seltzer, MD;  Location: [facility];  Service: General;  Laterality:  N/A;  . MOHS SURGERY Left    "basal; LLE"  . RECTOCELE REPAIR  2006  . SQUAMOUS CELL CARCINOMA EXCISION  "several"   "legs mostly"  . TOTAL KNEE ARTHROPLASTY Left 01/31/2016   Procedure: LEFT TOTAL KNEE ARTHROPLASTY;  Surgeon: Leandrew Koyanagi, MD;  Location: [facility];  Service: Orthopedics;  Laterality: Left;  Marland Kitchen VAGINAL HYSTERECTOMY  1968   "total"  . VIDEO BRONCHOSCOPY Bilateral 05/06/2018   Procedure: VIDEO BRONCHOSCOPY WITH FLUORO;  Surgeon: Collene Gobble, MD;  Location: [facility] ENDOSCOPY;  Service: Cardiopulmonary;  Laterality: Bilateral;     OB  History   None      Home Medications    Prior to Admission medications   Medication Sig Start Date End Date Taking? Authorizing Provider  acetaminophen (TYLENOL) 500 MG tablet Take 1,000 mg by mouth daily as needed for mild pain.   Yes [provider]  albuterol (PROAIR HFA) 108 (90 Base) MCG/ACT inhaler Inhale 2 puffs into the lungs every 4 (four) hours as needed for wheezing or shortness of breath. 03/31/18  Yes Collene Gobble, MD  atorvastatin (LIPITOR) 10 MG tablet Take 10 mg by mouth daily.   Yes [provider]  Calcium Carbonate-Vitamin D 600-400 MG-UNIT per tablet Take 1 tablet by mouth at bedtime.    Yes [provider]  Cholecalciferol (VITAMIN D3) 2000 units capsule Take 2,000 Units by mouth daily.   Yes [provider]  Cyanocobalamin (VITAMIN B-12) 5000 MCG TBDP Take 2,500 mcg by mouth at bedtime.   Yes [provider]  famotidine (PEPCID) 20 MG tablet Take 20 mg by mouth 2 (two) times daily.   Yes [provider]  feeding supplement, ENSURE ENLIVE, (ENSURE ENLIVE) LIQD Take 237 mLs by mouth 3 (three) times daily between meals. Patient taking differently: Take 237 mLs by mouth daily as needed (meal supplement).  06/18/18  Yes Aline August, MD  folic acid (FOLVITE) 1 MG tablet Take 1 mg by mouth every morning.    Yes [provider]  guaiFENesin (MUCINEX) 600 MG 12 hr tablet Take 600 mg by mouth 2 (two) times daily.    Yes [provider]  HYDROcodone-acetaminophen (NORCO/VICODIN) 5-325 MG tablet Take 1 tablet by mouth every 6 (six) hours as needed for moderate pain.   Yes [provider]  HYDROcodone-homatropine (HYCODAN) 5-1.5 MG/5ML syrup Take 5 mLs by mouth at bedtime and may repeat dose one time if needed. Patient taking differently: Take 5 mLs by mouth at bedtime.  06/18/18  Yes Aline August, MD  ipratropium-albuterol (DUONEB) 0.5-2.5 (3) MG/3ML SOLN Take 3 mLs by nebulization every 4 (four)  hours as needed (shortness of breath/wheezing). Patient taking differently: Take 3 mLs by nebulization every 8 (eight) hours.  06/18/18  Yes Aline August, MD  iron polysaccharides (NIFEREX) 150 MG capsule Take 150 mg by mouth daily.   Yes [provider]  lidocaine (XYLOCAINE) 2 % solution caregiver: Mix 1part 2% viscous lidocaine, 1part H20. Swallow 42mL of diluted mixture, 37min before meals and at bedtime, up to QID Patient taking differently: Use as directed 10 mLs in the mouth or throat 4 (four) times daily as needed for mouth pain. Mix 1part 2% viscous lidocaine, 1part H20. Swallow 42mL of diluted mixture, 78min before meals and at bedtime, up to QID 06/03/18  Yes Eppie Gibson, MD  loratadine (CLARITIN) 10 MG tablet Take 10 mg by mouth daily.   Yes [provider]  LORazepam (ATIVAN) 0.5 MG tablet Take 0.5  mg by mouth 2 (two) times daily as needed for anxiety.   Yes [provider]  mirtazapine (REMERON) 7.5 MG tablet Take 1 tablet (7.5 mg total) by mouth at bedtime. 06/18/18  Yes Aline August, MD  Multiple Vitamin (MULTIVITAMIN WITH MINERALS) TABS tablet Take 1 tablet by mouth daily.   Yes [provider]  ondansetron (ZOFRAN) 4 MG tablet Take 4 mg by mouth every 6 (six) hours as needed for nausea or vomiting.   Yes [provider]  polyethylene glycol (MIRALAX / GLYCOLAX) packet Take 17 g by mouth daily as needed for mild constipation.    Yes [provider]  Potassium 99 MG TABS Take 99 mg by mouth daily.   Yes [provider]  sennosides-docusate sodium (SENOKOT-S) 8.6-50 MG tablet Take 1 tablet by mouth at bedtime.    Yes [provider]  traMADol (ULTRAM) 50 MG tablet Take 1 tablet (50 mg total) by mouth every 6 (six) hours as needed for moderate pain. 06/18/18  Yes Aline August, MD  TRELEGY ELLIPTA 100-62.5-25 MCG/INH AEPB USE 1 INHALATION DAILY Patient taking differently: Inhale 1 puff into the lungs daily.   02/26/18  Yes Collene Gobble, MD  valsartan-hydrochlorothiazide (DIOVAN-HCT) 160-12.5 MG tablet Take 1 tablet by mouth daily.   Yes [provider]  metoprolol tartrate (LOPRESSOR) 25 MG tablet Take 1 tablet (25 mg total) by mouth 2 (two) times daily. Patient not taking: Reported on 08/13/2018 06/18/18 08/01/18  Aline August, MD    Family History Family History  Problem Relation Age of Onset  . Heart disease Father        Before age 32  . Hyperlipidemia Father   . Hypertension Father   . Pneumonia Mother   . Alzheimer's disease Mother   . Colon cancer Neg Hx     Social History Social History   Tobacco Use  . Smoking status: Former Smoker    Packs/day: 0.50    Years: 50.00    Pack years: 25.00    Types: Cigarettes    Last attempt to quit: 12/05/2000    Years since quitting: 17.6  . Smokeless tobacco: Never Used  . Tobacco comment: started smoking in early 20s  Substance Use Topics  . Alcohol use: No    Alcohol/week: 0.0 standard drinks  . Drug use: No     Allergies   Prochlorperazine edisylate; Sulfamethoxazole; and Doxycycline   Review of Systems Review of Systems  All other systems reviewed and are negative.    Physical Exam Updated Vital Signs BP 136/80   Pulse (!) 102   Temp (!) 97.4 F (36.3 C) (Oral)   Resp 18   Ht 5\' 5"  (1.651 m)   Wt 64 kg   SpO2 100%   BMI 23.46 kg/m   Physical Exam  Constitutional: She is oriented to person, place, and time. She appears well-developed. No distress.  Elderly, frail  HENT:  Head: Normocephalic and atraumatic.  Eyes: Pupils are equal, round, and reactive to light. Conjunctivae and EOM are normal.  Neck: Normal range of motion and phonation normal. Neck supple.  Cardiovascular: Normal rate and regular rhythm.  Pulmonary/Chest: Effort normal and breath sounds normal. She exhibits no tenderness.  Abdominal: Soft. She exhibits no distension. There is tenderness (Right lower quadrant, mild). There is no  guarding.  Musculoskeletal: Normal range of motion.  Neurological: She is alert and oriented to person, place, and time. She exhibits normal muscle tone.  Skin: Skin is warm and  dry.  Psychiatric: She has a normal mood and affect. Her behavior is normal.  Nursing note and vitals reviewed.    ED Treatments / Results  Labs (all labs ordered are listed, but only abnormal results are displayed) Labs Reviewed  BASIC METABOLIC PANEL - Abnormal; Notable for the following components:      Result Value   Glucose, Bld 116 (*)    Calcium 8.8 (*)    All other components within normal limits  CBC - Abnormal; Notable for the following components:   RBC 3.09 (*)    Hemoglobin 8.5 (*)    HCT 29.0 (*)    MCHC 29.3 (*)    RDW 19.1 (*)    All other components within normal limits  CBG MONITORING, ED - Abnormal; Notable for the following components:   Glucose-Capillary 104 (*)    All other components within normal limits  URINALYSIS, ROUTINE W REFLEX MICROSCOPIC    EKG EKG Interpretation  Date/Time:  Thursday August 13 2018 11:41:18 EDT Ventricular Rate:  109 PR Interval:    QRS Duration: 92 QT Interval:  323 QTC Calculation: 435 R Axis:   -140 Text Interpretation:  Sinus rhythm Low voltage, extremity and precordial leads Consider anterior infarct since last tracing no significant change Confirmed by Daleen Bo [phone]) on 08/13/2018 4:30:11 PM   Radiology Ct Abdomen Pelvis W Contrast  Result Date: 08/13/2018 CLINICAL DATA:  syncope episode. Pt doesn't remember the episode, pt threw up once they regained consciousness per bystander statement. No more N/V per EMS. Hx of COPD. Pt has stage 4 lung cancer in the rt. Lung per EMS. Pt has no complaints of SOB, EMS reports rhonchi on rt lung. EXAM: CT ABDOMEN AND PELVIS WITH CONTRAST TECHNIQUE: Multidetector CT imaging of the abdomen and pelvis was performed using the standard protocol following bolus administration of intravenous contrast.  CONTRAST:  119mL ISOVUE-300 IOPAMIDOL (ISOVUE-300) INJECTION 61% COMPARISON:  08/01/2018 FINDINGS: Lower chest: Coronary calcifications. Trace right pleural effusion. Masslike consolidation in the superior segment right lower lobe, and extensive airspace consolidation with air bronchograms in basilar segments right lower lobe as before. Pulmonary emphysema. 6 mm subpleural nodule in the posterolateral left lower lobe image 16/6, new since previous. Hepatobiliary: No focal liver abnormality is seen. No gallstones, gallbladder wall thickening, or biliary dilatation. Pancreas: Unremarkable. No pancreatic ductal dilatation or surrounding inflammatory changes. Spleen: Stable chronic infarct.  No new lesion. Adrenals/Urinary Tract: 1.4 cm left adrenal nodule as before. Normal right adrenal. Kidneys unremarkable. No hydronephrosis. Urinary bladder physiologically distended. Stomach/Bowel: Small hiatal hernia. Stomach and small bowel are decompressed. Changes of right hemicolectomy. Anastomotic staple line at the patent flexure. Residual colon is nondilated. Scattered diverticula from distal descending and sigmoid portions without significant adjacent inflammatory/edematous change or abscess. Vascular/Lymphatic: Heavy aortoiliac calcified plaque without aneurysm. No abdominal or pelvic adenopathy. Portal vein patent. Reproductive: Status post hysterectomy. No adnexal masses. Other: No ascites. No free air. Small paraumbilical hernia containing omental fat and a portion of the wall of the mid transverse colon, without evidence of obstruction or strangulation. Left inguinal hernia containing only fat. Musculoskeletal: Lumbar degenerative disc disease most marked L2-3, L5-S1. No fracture or worrisome bone lesion. IMPRESSION: 1. No acute abdominal process. 2. Some worsening of right lower lung airspace consolidation, and new left lower lobe subpleural nodule, suggesting progression of disease. 3. Small paraumbilical hernia  involving transverse colon without obstruction or strangulation. 4. Small hiatal hernia 5. Coronary and aortoiliac  atherosclerosis (ICD10-170.0). Electronically Signed   By:  Lucrezia Europe M.D.   On: 08/13/2018 15:36    Procedures Procedures (including critical care time)  Medications Ordered in ED Medications  0.9 %  sodium chloride infusion ( Intravenous New Bag/Given 08/13/18 1401)  iopamidol (ISOVUE-300) 61 % injection (has no administration in time range)  sodium chloride 0.9 % injection (has no administration in time range)  sodium chloride 0.9 % bolus 500 mL (500 mLs Intravenous New Bag/Given 08/13/18 1402)  iopamidol (ISOVUE-300) 61 % injection 100 mL (100 mLs Intravenous Contrast Given 08/13/18 1448)     Initial Impression / Assessment and Plan / ED Course  I have reviewed the triage vital signs and the nursing notes.  Pertinent labs & imaging results that were available during my care of the patient were reviewed by me and considered in my medical decision making (see chart for details).  Clinical Course as of Aug 13 1629  Thu Aug 13, 2018  1452 Normal except hemoglobin low  CBC(!) [EW]  1452 Normal except glucose high, calcium low  Basic metabolic panel(!) [EW]    Clinical Course User Index [EW] Daleen Bo, MD     Patient Vitals for the past 24 hrs:  BP Temp Temp src Pulse Resp SpO2 Height Weight  08/13/18 1600 136/80 - - (!) 102 18 100 % - -  08/13/18 1551 139/68 - - (!) 102 19 100 % - -  08/13/18 1430 127/64 - - 99 18 100 % - -  08/13/18 1400 116/62 - - (!) 101 15 100 % - -  08/13/18 1347 125/74 - - (!) 101 18 100 % - -  08/13/18 1142 - - - - - - 5\' 5"  (1.651 m) 64 kg  08/13/18 1140 - - - (!) 108 17 96 % - -  08/13/18 1137 - (!) 97.4 F (36.3 C) Oral (!) 117 - 97 % - -  08/13/18 1132 (!) 106/54 - - - - - - -  08/13/18 1130 (!) 106/54 (!) 97.4 F (36.3 C) Oral (!) 106 18 95 % - -    4:30 PM Reevaluation with update and discussion. After initial assessment  and treatment, an updated evaluation reveals she is comfortable hungry, she is offered food and fluid.  Findings discussed with patient and family members, all questions answered. Daleen Bo   Medical Decision Making: Syncope likely vasovagal with reassuring findings.  No indication for hospitalization or further ED intervention at this time.  CRITICAL CARE-no Performed by: Daleen Bo  Nursing Notes Reviewed/ Care Coordinated Applicable Imaging Reviewed Interpretation of Laboratory Data incorporated into ED treatment  The patient appears reasonably screened and/or stabilized for discharge and I doubt any other medical condition or other [facility] requiring further screening, evaluation, or treatment in the ED at this time prior to discharge.  Plan: Home Medications-continue usual medications; Home Treatments-fluids, regular diet; return here if the recommended treatment, does not improve the symptoms; Recommended follow up-PCP and oncology follow-up as scheduled and as needed   Final Clinical Impressions(s) / ED Diagnoses   Final diagnoses:  Syncope, unspecified syncope type    ED Discharge Orders    None       Daleen Bo, MD 08/14/18 1025

## 2018-08-13 NOTE — ED Notes (Signed)
Bed: WA22 Expected date:  Expected time:  Means of arrival:  Comments: EMS 

## 2018-08-13 NOTE — ED Notes (Signed)
Patient transported to CT 

## 2018-08-13 NOTE — ED Provider Notes (Signed)
Pt seen by Dr Eulis Foster.  Please see his notes.  Asked to follow up on UA.  UA negative for UTI.   Dorie Rank, MD 08/13/18 [phone]

## 2018-08-13 NOTE — ED Triage Notes (Signed)
Patient arrived by EMS from home. Pt has a syncope episode. Pt doesn't remember the episode, pt threw up once they regained consciousness per bystander statement. No more N/V per EMS.  Hx of COPD. Pt has stage 4 lung cancer in the rt. Lung per EMS. Pt has no complaints of SOB, EMS reports rhonchi on rt lung.   SpO2 97% 4 L [place], 120/54. HR 106, CBG 136.

## 2018-08-13 NOTE — ED Notes (Signed)
Patient placed on Purewick. Will collect urine sample when patient voids.

## 2018-08-13 NOTE — Discharge Instructions (Addendum)
There is no clear cause for the syncopal event today.  Testing is reassuring.  It is possible you have had some progression in your lung cancer.  There are no signs of severe infection or problems with your heart at this time.  Continue your usual treatments, and follow-up with your primary care doctor and oncologist as planned.

## 2018-08-13 NOTE — ED Notes (Signed)
Patient given discharge teaching and verbalized understanding. Patient taken out of ED by wheelchair w/ family member.

## 2018-08-14 DIAGNOSIS — J449 Chronic obstructive pulmonary disease, unspecified: Secondary | ICD-10-CM | POA: Diagnosis not present

## 2018-08-14 DIAGNOSIS — C3431 Malignant neoplasm of lower lobe, right bronchus or lung: Secondary | ICD-10-CM | POA: Diagnosis not present

## 2018-08-14 DIAGNOSIS — J9621 Acute and chronic respiratory failure with hypoxia: Secondary | ICD-10-CM | POA: Diagnosis not present

## 2018-08-14 DIAGNOSIS — D649 Anemia, unspecified: Secondary | ICD-10-CM | POA: Diagnosis not present

## 2018-08-14 DIAGNOSIS — I10 Essential (primary) hypertension: Secondary | ICD-10-CM | POA: Diagnosis not present

## 2018-08-14 DIAGNOSIS — L930 Discoid lupus erythematosus: Secondary | ICD-10-CM | POA: Diagnosis not present

## 2018-08-17 ENCOUNTER — Encounter: Payer: Self-pay | Admitting: Emergency Medicine

## 2018-08-17 ENCOUNTER — Ambulatory Visit (INDEPENDENT_AMBULATORY_CARE_PROVIDER_SITE_OTHER): Payer: Medicare Other | Admitting: Emergency Medicine

## 2018-08-17 DIAGNOSIS — C3491 Malignant neoplasm of unspecified part of right bronchus or lung: Secondary | ICD-10-CM

## 2018-08-17 DIAGNOSIS — J449 Chronic obstructive pulmonary disease, unspecified: Secondary | ICD-10-CM

## 2018-08-17 DIAGNOSIS — J9611 Chronic respiratory failure with hypoxia: Secondary | ICD-10-CM

## 2018-08-17 DIAGNOSIS — L930 Discoid lupus erythematosus: Secondary | ICD-10-CM | POA: Diagnosis not present

## 2018-08-17 DIAGNOSIS — I779 Disorder of arteries and arterioles, unspecified: Secondary | ICD-10-CM | POA: Diagnosis not present

## 2018-08-17 DIAGNOSIS — I10 Essential (primary) hypertension: Secondary | ICD-10-CM | POA: Diagnosis not present

## 2018-08-17 DIAGNOSIS — J9621 Acute and chronic respiratory failure with hypoxia: Secondary | ICD-10-CM | POA: Diagnosis not present

## 2018-08-17 DIAGNOSIS — C3431 Malignant neoplasm of lower lobe, right bronchus or lung: Secondary | ICD-10-CM | POA: Diagnosis not present

## 2018-08-17 DIAGNOSIS — D649 Anemia, unspecified: Secondary | ICD-10-CM | POA: Diagnosis not present

## 2018-08-17 NOTE — Assessment & Plan Note (Signed)
Continue your Keytruda treatments as organized by Dr. Julien Nordmann.  Repeat CT scan of the chest per his plans. Flu shot is up-to-date. Pneumonia shot up-to-date. Follow with Dr Lamonte Sakai in 3 months or sooner if you have any problems.

## 2018-08-17 NOTE — Assessment & Plan Note (Signed)
Please continue to use Trelegy 1 inhalation once daily as you have been doing.  Remember to rinse and gargle after using. Keep your albuterol available to use 2 puffs up to every 4 hours if you need it. Okay to continue your albuterol nebulizers 3 times a day.

## 2018-08-17 NOTE — Progress Notes (Signed)
77 yo woman with HTN, former tobacco. Dx with severe AFL and severe COPD.    ROV 03/31/18 --Casey Wilkinson is a 77 year old woman with a history of severe COPD and allergic rhinitis, chronic hypoxemia on 2L/min w exertion.   She also is on immunosuppression for lupus and psoriasis.  She has been following with Dr. Virgina Jock in dealing with a complicated right lower lobe pneumonia beginning a month ago, failed to fully resolve after antibiotics. A Ct scan 5/28 shows RLL consolidation and an rounded area concerning for abscess. Cefdinir + azithro started, she is about to complete. May be slightly better.  She has less cough, less blood in her mucous. Her MTX was held with this illness  ROV 08/17/18 --this is a follow-up visit for patient with a history of tobacco, severe COPD with associated hypoxemic respiratory failure, immunosuppression for lupus/psoriasis.  She underwent bronchoscopy 05/06/2018 for a non-resolving right lower lobe infiltrate.  This showed evidence for adenocarcinoma, stage IIIa/IV.  She was hospitalized with cough and hemoptysis, underwent palliative radiotherapy in 8/19. Her cough and hemoptysis are better. She is now taking Keytruda, has had 2 doses. She had an episode of syncope last week. CT abd done this week shows some ? Improvement in RLL consolidation. She has R sided insp pain. She is currently on 4 L/min at all times. She remains on Trelegy reliably. She is doing albuterol nebs tid.    Vitals:   08/17/18 1142  BP: 110/60  Pulse: (!) 115  SpO2: 93%  Weight: 150 lb (68 kg)  Height: 5\' 5"  (1.651 m)   Gen: Pleasant, well-nourished, in no distress,  normal affect  ENT: No lesions,  mouth clear,  oropharynx clear, no postnasal drip  Neck: No JVD, no stridor  Lungs: No use of accessory muscles, decreased breath sounds right base  Cardiovascular: RRR, heart sounds normal, no murmur or gallops, no peripheral edema  Musculoskeletal: No deformities, no cyanosis or clubbing  Neuro:  alert, non focal  Skin: Warm, no lesions or rashes    COPD (chronic obstructive pulmonary disease) (HCC) Please continue to use Trelegy 1 inhalation once daily as you have been doing.  Remember to rinse and gargle after using. Keep your albuterol available to use 2 puffs up to every 4 hours if you need it. Okay to continue your albuterol nebulizers 3 times a day.  Adenocarcinoma of right lung, stage 4 ([place]) Continue your Keytruda treatments as organized by Dr. Julien Nordmann.  Repeat CT scan of the chest per his plans. Flu shot is up-to-date. Pneumonia shot up-to-date. Follow with Dr Lamonte Sakai in 3 months or sooner if you have any problems.  Chronic respiratory failure (HCC) Please continue your oxygen at 4 L/min at all times.   Baltazar Apo, MD, PhD 08/17/2018, 12:17 PM [facility] [phone] or if no answer [phone]

## 2018-08-17 NOTE — Assessment & Plan Note (Signed)
Please continue your oxygen at 4 L/min at all times.

## 2018-08-17 NOTE — Patient Instructions (Addendum)
Please continue to use Trelegy 1 inhalation once daily as you have been doing.  Remember to rinse and gargle after using. Keep your albuterol available to use 2 puffs up to every 4 hours if you need it. Okay to continue your albuterol nebulizers 3 times a day. Please continue your oxygen at 4 L/min at all times. Continue your Keytruda treatments as organized by Dr. Julien Nordmann.  Repeat CT scan of the chest per his plans. Flu shot is up-to-date. Pneumonia shot up-to-date. Follow with Dr Lamonte Sakai in 3 months or sooner if you have any problems.

## 2018-08-18 ENCOUNTER — Telehealth: Payer: Self-pay | Admitting: Medical Oncology

## 2018-08-18 DIAGNOSIS — J9621 Acute and chronic respiratory failure with hypoxia: Secondary | ICD-10-CM | POA: Diagnosis not present

## 2018-08-18 DIAGNOSIS — I10 Essential (primary) hypertension: Secondary | ICD-10-CM | POA: Diagnosis not present

## 2018-08-18 DIAGNOSIS — L930 Discoid lupus erythematosus: Secondary | ICD-10-CM | POA: Diagnosis not present

## 2018-08-18 DIAGNOSIS — C3431 Malignant neoplasm of lower lobe, right bronchus or lung: Secondary | ICD-10-CM | POA: Diagnosis not present

## 2018-08-18 DIAGNOSIS — D649 Anemia, unspecified: Secondary | ICD-10-CM | POA: Diagnosis not present

## 2018-08-18 DIAGNOSIS — J449 Chronic obstructive pulmonary disease, unspecified: Secondary | ICD-10-CM | POA: Diagnosis not present

## 2018-08-18 NOTE — Telephone Encounter (Addendum)
"  Can I get my teeth cleaned"? Pt on Keytruda. Per Dr Julien Nordmann I  told her it is okay for her to get her teeth professionally cleaned. Pt notified. Dentist Rolan Jones-fax [phone].

## 2018-08-20 DIAGNOSIS — D649 Anemia, unspecified: Secondary | ICD-10-CM | POA: Diagnosis not present

## 2018-08-20 DIAGNOSIS — I10 Essential (primary) hypertension: Secondary | ICD-10-CM | POA: Diagnosis not present

## 2018-08-20 DIAGNOSIS — J9621 Acute and chronic respiratory failure with hypoxia: Secondary | ICD-10-CM | POA: Diagnosis not present

## 2018-08-20 DIAGNOSIS — L930 Discoid lupus erythematosus: Secondary | ICD-10-CM | POA: Diagnosis not present

## 2018-08-20 DIAGNOSIS — J449 Chronic obstructive pulmonary disease, unspecified: Secondary | ICD-10-CM | POA: Diagnosis not present

## 2018-08-20 DIAGNOSIS — C3431 Malignant neoplasm of lower lobe, right bronchus or lung: Secondary | ICD-10-CM | POA: Diagnosis not present

## 2018-08-21 DIAGNOSIS — D649 Anemia, unspecified: Secondary | ICD-10-CM | POA: Diagnosis not present

## 2018-08-21 DIAGNOSIS — L930 Discoid lupus erythematosus: Secondary | ICD-10-CM | POA: Diagnosis not present

## 2018-08-21 DIAGNOSIS — J449 Chronic obstructive pulmonary disease, unspecified: Secondary | ICD-10-CM | POA: Diagnosis not present

## 2018-08-21 DIAGNOSIS — I10 Essential (primary) hypertension: Secondary | ICD-10-CM | POA: Diagnosis not present

## 2018-08-21 DIAGNOSIS — C3431 Malignant neoplasm of lower lobe, right bronchus or lung: Secondary | ICD-10-CM | POA: Diagnosis not present

## 2018-08-21 DIAGNOSIS — J9621 Acute and chronic respiratory failure with hypoxia: Secondary | ICD-10-CM | POA: Diagnosis not present

## 2018-08-24 DIAGNOSIS — C3431 Malignant neoplasm of lower lobe, right bronchus or lung: Secondary | ICD-10-CM | POA: Diagnosis not present

## 2018-08-24 DIAGNOSIS — I10 Essential (primary) hypertension: Secondary | ICD-10-CM | POA: Diagnosis not present

## 2018-08-24 DIAGNOSIS — J9621 Acute and chronic respiratory failure with hypoxia: Secondary | ICD-10-CM | POA: Diagnosis not present

## 2018-08-24 DIAGNOSIS — D649 Anemia, unspecified: Secondary | ICD-10-CM | POA: Diagnosis not present

## 2018-08-24 DIAGNOSIS — L930 Discoid lupus erythematosus: Secondary | ICD-10-CM | POA: Diagnosis not present

## 2018-08-24 DIAGNOSIS — J449 Chronic obstructive pulmonary disease, unspecified: Secondary | ICD-10-CM | POA: Diagnosis not present

## 2018-08-25 DIAGNOSIS — I10 Essential (primary) hypertension: Secondary | ICD-10-CM | POA: Diagnosis not present

## 2018-08-25 DIAGNOSIS — C3431 Malignant neoplasm of lower lobe, right bronchus or lung: Secondary | ICD-10-CM | POA: Diagnosis not present

## 2018-08-25 DIAGNOSIS — J9621 Acute and chronic respiratory failure with hypoxia: Secondary | ICD-10-CM | POA: Diagnosis not present

## 2018-08-25 DIAGNOSIS — D649 Anemia, unspecified: Secondary | ICD-10-CM | POA: Diagnosis not present

## 2018-08-25 DIAGNOSIS — J449 Chronic obstructive pulmonary disease, unspecified: Secondary | ICD-10-CM | POA: Diagnosis not present

## 2018-08-25 DIAGNOSIS — L930 Discoid lupus erythematosus: Secondary | ICD-10-CM | POA: Diagnosis not present

## 2018-08-26 ENCOUNTER — Telehealth: Payer: Self-pay | Admitting: Internal Medicine

## 2018-08-26 ENCOUNTER — Inpatient Hospital Stay: Payer: Medicare Other | Attending: Internal Medicine

## 2018-08-26 ENCOUNTER — Inpatient Hospital Stay (HOSPITAL_BASED_OUTPATIENT_CLINIC_OR_DEPARTMENT_OTHER): Payer: Medicare Other | Admitting: Internal Medicine

## 2018-08-26 ENCOUNTER — Inpatient Hospital Stay: Payer: Medicare Other

## 2018-08-26 ENCOUNTER — Encounter: Payer: Self-pay | Admitting: Internal Medicine

## 2018-08-26 VITALS — BP 107/58 | HR 113 | Temp 98.0°F | Resp 18 | Ht 65.0 in | Wt 150.7 lb

## 2018-08-26 DIAGNOSIS — M5137 Other intervertebral disc degeneration, lumbosacral region: Secondary | ICD-10-CM | POA: Insufficient documentation

## 2018-08-26 DIAGNOSIS — I700 Atherosclerosis of aorta: Secondary | ICD-10-CM | POA: Diagnosis not present

## 2018-08-26 DIAGNOSIS — Z79899 Other long term (current) drug therapy: Secondary | ICD-10-CM | POA: Diagnosis not present

## 2018-08-26 DIAGNOSIS — C3431 Malignant neoplasm of lower lobe, right bronchus or lung: Secondary | ICD-10-CM | POA: Insufficient documentation

## 2018-08-26 DIAGNOSIS — L930 Discoid lupus erythematosus: Secondary | ICD-10-CM | POA: Diagnosis not present

## 2018-08-26 DIAGNOSIS — Z5111 Encounter for antineoplastic chemotherapy: Secondary | ICD-10-CM | POA: Diagnosis not present

## 2018-08-26 DIAGNOSIS — K449 Diaphragmatic hernia without obstruction or gangrene: Secondary | ICD-10-CM | POA: Insufficient documentation

## 2018-08-26 DIAGNOSIS — J90 Pleural effusion, not elsewhere classified: Secondary | ICD-10-CM | POA: Insufficient documentation

## 2018-08-26 DIAGNOSIS — Z9049 Acquired absence of other specified parts of digestive tract: Secondary | ICD-10-CM | POA: Insufficient documentation

## 2018-08-26 DIAGNOSIS — K4090 Unilateral inguinal hernia, without obstruction or gangrene, not specified as recurrent: Secondary | ICD-10-CM | POA: Insufficient documentation

## 2018-08-26 DIAGNOSIS — C3491 Malignant neoplasm of unspecified part of right bronchus or lung: Secondary | ICD-10-CM

## 2018-08-26 DIAGNOSIS — K760 Fatty (change of) liver, not elsewhere classified: Secondary | ICD-10-CM | POA: Insufficient documentation

## 2018-08-26 DIAGNOSIS — I119 Hypertensive heart disease without heart failure: Secondary | ICD-10-CM | POA: Insufficient documentation

## 2018-08-26 DIAGNOSIS — I708 Atherosclerosis of other arteries: Secondary | ICD-10-CM | POA: Diagnosis not present

## 2018-08-26 DIAGNOSIS — M5136 Other intervertebral disc degeneration, lumbar region: Secondary | ICD-10-CM | POA: Diagnosis not present

## 2018-08-26 DIAGNOSIS — K429 Umbilical hernia without obstruction or gangrene: Secondary | ICD-10-CM | POA: Diagnosis not present

## 2018-08-26 DIAGNOSIS — R5382 Chronic fatigue, unspecified: Secondary | ICD-10-CM

## 2018-08-26 DIAGNOSIS — J449 Chronic obstructive pulmonary disease, unspecified: Secondary | ICD-10-CM | POA: Diagnosis not present

## 2018-08-26 DIAGNOSIS — C3490 Malignant neoplasm of unspecified part of unspecified bronchus or lung: Secondary | ICD-10-CM

## 2018-08-26 DIAGNOSIS — Z5112 Encounter for antineoplastic immunotherapy: Secondary | ICD-10-CM

## 2018-08-26 DIAGNOSIS — Z85038 Personal history of other malignant neoplasm of large intestine: Secondary | ICD-10-CM | POA: Insufficient documentation

## 2018-08-26 DIAGNOSIS — I10 Essential (primary) hypertension: Secondary | ICD-10-CM

## 2018-08-26 LAB — CBC WITH DIFFERENTIAL (CANCER CENTER ONLY)
ABS IMMATURE GRANULOCYTES: 0.02 10*3/uL (ref 0.00–0.07)
Basophils Absolute: 0 10*3/uL (ref 0.0–0.1)
Basophils Relative: 1 %
Eosinophils Absolute: 0 10*3/uL (ref 0.0–0.5)
Eosinophils Relative: 1 %
HCT: 28.7 % — ABNORMAL LOW (ref 36.0–46.0)
HEMOGLOBIN: 8.8 g/dL — AB (ref 12.0–15.0)
Immature Granulocytes: 0 %
LYMPHS PCT: 5 %
Lymphs Abs: 0.3 10*3/uL — ABNORMAL LOW (ref 0.7–4.0)
MCH: 28.3 pg (ref 26.0–34.0)
MCHC: 30.7 g/dL (ref 30.0–36.0)
MCV: 92.3 fL (ref 80.0–100.0)
MONO ABS: 0.5 10*3/uL (ref 0.1–1.0)
MONOS PCT: 8 %
NEUTROS ABS: 4.8 10*3/uL (ref 1.7–7.7)
Neutrophils Relative %: 85 %
Platelet Count: 209 10*3/uL (ref 150–400)
RBC: 3.11 MIL/uL — ABNORMAL LOW (ref 3.87–5.11)
RDW: 18 % — ABNORMAL HIGH (ref 11.5–15.5)
WBC Count: 5.6 10*3/uL (ref 4.0–10.5)
nRBC: 0 % (ref 0.0–0.2)

## 2018-08-26 LAB — CMP (CANCER CENTER ONLY)
ALT: 14 U/L (ref 0–44)
AST: 27 U/L (ref 15–41)
Albumin: 2.2 g/dL — ABNORMAL LOW (ref 3.5–5.0)
Alkaline Phosphatase: 211 U/L — ABNORMAL HIGH (ref 38–126)
Anion gap: 7 (ref 5–15)
BILIRUBIN TOTAL: 0.5 mg/dL (ref 0.3–1.2)
BUN: 12 mg/dL (ref 8–23)
CHLORIDE: 101 mmol/L (ref 98–111)
CO2: 29 mmol/L (ref 22–32)
Calcium: 8.8 mg/dL — ABNORMAL LOW (ref 8.9–10.3)
Creatinine: 0.66 mg/dL (ref 0.44–1.00)
GFR, Est AFR Am: >60 mL/min (ref >60)
GFR, Estimated: >60 mL/min (ref >60)
GLUCOSE: 97 mg/dL (ref 70–99)
POTASSIUM: 4 mmol/L (ref 3.5–5.1)
Sodium: 137 mmol/L (ref 135–145)
TOTAL PROTEIN: 7 g/dL (ref 6.5–8.1)

## 2018-08-26 LAB — TSH: TSH: 2.97 u[IU]/mL (ref 0.308–3.960)

## 2018-08-26 LAB — RESEARCH LABS

## 2018-08-26 MED ORDER — SODIUM CHLORIDE 0.9 % IV SOLN
Freq: Once | INTRAVENOUS | Status: AC
  Administered 2018-08-26: 12:00:00 via INTRAVENOUS
  Filled 2018-08-26: qty 250

## 2018-08-26 MED ORDER — SODIUM CHLORIDE 0.9 % IV SOLN
200.0000 mg | Freq: Once | INTRAVENOUS | Status: AC
  Administered 2018-08-26: 200 mg via INTRAVENOUS
  Filled 2018-08-26: qty 8

## 2018-08-26 NOTE — Progress Notes (Signed)
[facility] [phone]   [phone]  OFFICE PROGRESS NOTE  Shon Baton, MD [place] [address]  DIAGNOSIS: unresectable stage IIIA/IV non-small cell lung cancer, adenocarcinoma diagnosed in July 2019 presented with right lower lobe lung mass as well as right pleural effusion and a small left lower lobe lung nodules. Molecular studies showed positive RET rearrangement and PDL 1 expression of 5%.  PRIOR THERAPY: None  CURRENT THERAPY: First-line treatment with immunotherapy with Keytruda 200 mg IV every 3 weeks.  First dose 07/15/2018.  Status post 2 cycles.  INTERVAL HISTORY: Casey Wilkinson 77 y.o. female returns to the clinic today for follow-up visit accompanied by her husband and son.  The patient is tolerating her current treatment with Keytruda fairly well with no concerning complaints.  She denied having any chest pain but continues to have shortness of breath at baseline increased with exertion.  She is currently on home oxygen.  She had one episode of abdominal pain last night that spontaneously improved.  She denied having any fever or chills.  She has no nausea, vomiting, diarrhea or constipation.  She is here today for evaluation before starting cycle #3 of her treatment.  MEDICAL HISTORY: Past Medical History:  Diagnosis Date  . Anemia   . Arthritis    "all over"  . Carotid artery stenosis    right side followed by [facility]- 65%  . Colitis April 2016  . Colon cancer ([place])   . COPD (chronic obstructive pulmonary disease) ([place])   . Discoid lupus erythematosus   . Diverticulitis April 2016   and Colitis  . History of blood transfusion 01/2015, 06/2015   colectomy  . Hypertension   . Psoriatic arthritis ([place])   . Shortness of breath dyspnea    with exertion  . Squamous cell carcinoma, leg     ALLERGIES:  is allergic to prochlorperazine edisylate; sulfamethoxazole; and doxycycline.  MEDICATIONS:  Current  Outpatient Medications  Medication Sig Dispense Refill  . acetaminophen (TYLENOL) 500 MG tablet Take 1,000 mg by mouth daily as needed for mild pain.    Marland Kitchen albuterol (PROAIR HFA) 108 (90 Base) MCG/ACT inhaler Inhale 2 puffs into the lungs every 4 (four) hours as needed for wheezing or shortness of breath. 3 Inhaler 1  . atorvastatin (LIPITOR) 10 MG tablet Take 10 mg by mouth daily.    . Calcium Carbonate-Vitamin D 600-400 MG-UNIT per tablet Take 1 tablet by mouth at bedtime.     . Cholecalciferol (VITAMIN D3) 2000 units capsule Take 2,000 Units by mouth daily.    . Cyanocobalamin (VITAMIN B-12) 5000 MCG TBDP Take 2,500 mcg by mouth at bedtime.    . famotidine (PEPCID) 20 MG tablet Take 20 mg by mouth 2 (two) times daily.    . feeding supplement, ENSURE ENLIVE, (ENSURE ENLIVE) LIQD Take 237 mLs by mouth 3 (three) times daily between meals. (Patient taking differently: Take 237 mLs by mouth daily as needed (meal supplement). ) 448 mL 12  . folic acid (FOLVITE) 1 MG tablet Take 1 mg by mouth every morning.     Marland Kitchen guaiFENesin (MUCINEX) 600 MG 12 hr tablet Take 600 mg by mouth 2 (two) times daily.     Marland Kitchen HYDROcodone-acetaminophen (NORCO/VICODIN) 5-325 MG tablet Take 1 tablet by mouth every 6 (six) hours as needed for moderate pain.    Marland Kitchen HYDROcodone-homatropine (HYCODAN) 5-1.5 MG/5ML syrup Take 5 mLs by mouth at bedtime and may repeat dose one  time if needed. (Patient taking differently: Take 5 mLs by mouth at bedtime. ) 120 mL 0  . ipratropium-albuterol (DUONEB) 0.5-2.5 (3) MG/3ML SOLN Take 3 mLs by nebulization every 4 (four) hours as needed (shortness of breath/wheezing). (Patient taking differently: Take 3 mLs by nebulization every 8 (eight) hours. ) 360 mL 0  . iron polysaccharides (NIFEREX) 150 MG capsule Take 150 mg by mouth daily.    Marland Kitchen lidocaine (XYLOCAINE) 2 % solution caregiver: Mix 1part 2% viscous lidocaine, 1part H20. Swallow 68m of diluted mixture, 313m before meals and at bedtime, up to QID  (Patient taking differently: Use as directed 10 mLs in the mouth or throat 4 (four) times daily as needed for mouth pain. Mix 1part 2% viscous lidocaine, 1part H20. Swallow 1043mf diluted mixture, 27m33mefore meals and at bedtime, up to QID) 150 mL 2  . loratadine (CLARITIN) 10 MG tablet Take 10 mg by mouth daily.    . LOMarland Kitchenazepam (ATIVAN) 0.5 MG tablet Take 0.5 mg by mouth 2 (two) times daily as needed for anxiety.    . metoprolol tartrate (LOPRESSOR) 25 MG tablet Take 1 tablet (25 mg total) by mouth 2 (two) times daily. (Patient not taking: Reported on 08/13/2018) 60 tablet 0  . mirtazapine (REMERON) 7.5 MG tablet Take 1 tablet (7.5 mg total) by mouth at bedtime. 10 tablet 0  . Multiple Vitamin (MULTIVITAMIN WITH MINERALS) TABS tablet Take 1 tablet by mouth daily.    . ondansetron (ZOFRAN) 4 MG tablet Take 4 mg by mouth every 6 (six) hours as needed for nausea or vomiting.    . polyethylene glycol (MIRALAX / GLYCOLAX) packet Take 17 g by mouth daily as needed for mild constipation.     . Potassium 99 MG TABS Take 99 mg by mouth daily.    . sennosides-docusate sodium (SENOKOT-S) 8.6-50 MG tablet Take 1 tablet by mouth at bedtime.     . traMADol (ULTRAM) 50 MG tablet Take 1 tablet (50 mg total) by mouth every 6 (six) hours as needed for moderate pain. 14 tablet 0  . TRELEGY ELLIPTA 100-62.5-25 MCG/INH AEPB USE 1 INHALATION DAILY (Patient taking differently: Inhale 1 puff into the lungs daily. ) 180 each 1  . valsartan-hydrochlorothiazide (DIOVAN-HCT) 160-12.5 MG tablet Take 1 tablet by mouth daily.     No current facility-administered medications for this visit.     SURGICAL HISTORY:  Past Surgical History:  Procedure Laterality Date  . APPENDECTOMY  06/2015  . CATARACT EXTRACTION W/ INTRAOCULAR LENS  IMPLANT, BILATERAL Bilateral 2017  . COLONOSCOPY  06/23/15  . COLONOSCOPY W/ POLYPECTOMY    . CYSTOCELE REPAIR  2006  . DILATION AND CURETTAGE OF UTERUS  X 2  . EXCISIONAL HEMORRHOIDECTOMY  2006    . JOINT REPLACEMENT    . LAPAROSCOPIC PARTIAL COLECTOMY N/A 06/23/2015   Procedure: LAPAROSCOPIC ILEOCECETOMY;  Surgeon: BenjExcell Seltzer;  Location: [facility];  Service: General;  Laterality: N/A;  . MOHS SURGERY Left    "basal; LLE"  . RECTOCELE REPAIR  2006  . SQUAMOUS CELL CARCINOMA EXCISION  "several"   "legs mostly"  . TOTAL KNEE ARTHROPLASTY Left 01/31/2016   Procedure: LEFT TOTAL KNEE ARTHROPLASTY;  Surgeon: NaipLeandrew Koyanagi;  [facility];  Laterality: Left;  . VAMarland KitchenINAL HYSTERECTOMY  1968   "total"  . VIDEO BRONCHOSCOPY Bilateral 05/06/2018   Procedure: VIDEO BRONCHOSCOPY WITH FLUORO;  Surgeon: ByruCollene Gobble;  [facility];  Service: Cardiopulmonary;  Laterality: Bilateral;  REVIEW OF SYSTEMS:  A comprehensive review of systems was negative except for: Constitutional: positive for fatigue Respiratory: positive for dyspnea on exertion Gastrointestinal: positive for abdominal pain   PHYSICAL EXAMINATION: General appearance: alert, cooperative, fatigued and no distress Head: Normocephalic, without obvious abnormality, atraumatic Neck: no adenopathy, no JVD, supple, symmetrical, trachea midline and thyroid not enlarged, symmetric, no tenderness/mass/nodules Lymph nodes: Cervical, supraclavicular, and axillary nodes normal. Resp: wheezes bilaterally Back: symmetric, no curvature. ROM normal. No CVA tenderness. Cardio: regular rate and rhythm, S1, S2 normal, no murmur, click, rub or gallop GI: soft, non-tender; bowel sounds normal; no masses,  no organomegaly Extremities: extremities normal, atraumatic, no cyanosis or edema  ECOG PERFORMANCE STATUS: 1 - Symptomatic but completely ambulatory  Blood pressure (!) 107/58, pulse (!) 113, temperature 98 F (36.7 C), temperature source Oral, resp. rate 18, height _0  (1.651 m), weight 150 lb 11.2 oz (68.4 kg), SpO2 90 %.  LABORATORY DATA: Lab Results  Component Value Date   WBC 5.6  08/26/2018   HGB 8.8 (L) 08/26/2018   HCT 28.7 (L) 08/26/2018   MCV 92.3 08/26/2018   PLT 209 08/26/2018      Chemistry      Component Value Date/Time   NA 137 08/26/2018 0924   K 4.0 08/26/2018 0924   CL 101 08/26/2018 0924   CO2 29 08/26/2018 0924   BUN 12 08/26/2018 0924   CREATININE 0.66 08/26/2018 0924      Component Value Date/Time   CALCIUM 8.8 (L) 08/26/2018 0924   ALKPHOS 211 (H) 08/26/2018 0924   AST 27 08/26/2018 0924   ALT 14 08/26/2018 0924   BILITOT 0.5 08/26/2018 0924       RADIOGRAPHIC STUDIES: Ct Abdomen Pelvis W Contrast  Result Date: 08/13/2018 CLINICAL DATA:  syncope episode. Pt doesn't remember the episode, pt threw up once they regained consciousness per bystander statement. No more N/V per EMS. Hx of COPD. Pt has stage 4 lung cancer in the rt. Lung per EMS. Pt has no complaints of SOB, EMS reports rhonchi on rt lung. EXAM: CT ABDOMEN AND PELVIS WITH CONTRAST TECHNIQUE: Multidetector CT imaging of the abdomen and pelvis was performed using the standard protocol following bolus administration of intravenous contrast. CONTRAST:  170m ISOVUE-300 IOPAMIDOL (ISOVUE-300) INJECTION 61% COMPARISON:  08/01/2018 FINDINGS: Lower chest: Coronary calcifications. Trace right pleural effusion. Masslike consolidation in the superior segment right lower lobe, and extensive airspace consolidation with air bronchograms in basilar segments right lower lobe as before. Pulmonary emphysema. 6 mm subpleural nodule in the posterolateral left lower lobe image 16/6, new since previous. Hepatobiliary: No focal liver abnormality is seen. No gallstones, gallbladder wall thickening, or biliary dilatation. Pancreas: Unremarkable. No pancreatic ductal dilatation or surrounding inflammatory changes. Spleen: Stable chronic infarct.  No new lesion. Adrenals/Urinary Tract: 1.4 cm left adrenal nodule as before. Normal right adrenal. Kidneys unremarkable. No hydronephrosis. Urinary bladder  physiologically distended. Stomach/Bowel: Small hiatal hernia. Stomach and small bowel are decompressed. Changes of right hemicolectomy. Anastomotic staple line at the patent flexure. Residual colon is nondilated. Scattered diverticula from distal descending and sigmoid portions without significant adjacent inflammatory/edematous change or abscess. Vascular/Lymphatic: Heavy aortoiliac calcified plaque without aneurysm. No abdominal or pelvic adenopathy. Portal vein patent. Reproductive: Status post hysterectomy. No adnexal masses. Other: No ascites. No free air. Small paraumbilical hernia containing omental fat and a portion of the wall of the mid transverse colon, without evidence of obstruction or strangulation. Left inguinal hernia containing only fat. Musculoskeletal: Lumbar degenerative disc disease  most marked L2-3, L5-S1. No fracture or worrisome bone lesion. IMPRESSION: 1. No acute abdominal process. 2. Some worsening of right lower lung airspace consolidation, and new left lower lobe subpleural nodule, suggesting progression of disease. 3. Small paraumbilical hernia involving transverse colon without obstruction or strangulation. 4. Small hiatal hernia 5. Coronary and aortoiliac  atherosclerosis (ICD10-170.0). Electronically Signed   By: Lucrezia Europe M.D.   On: 08/13/2018 15:36   Ct Abdomen Pelvis W Contrast  Result Date: 08/01/2018 CLINICAL DATA:  Constipation for 1 week. History of colon cancer post partial colectomy. EXAM: CT ABDOMEN AND PELVIS WITH CONTRAST TECHNIQUE: Multidetector CT imaging of the abdomen and pelvis was performed using the standard protocol following bolus administration of intravenous contrast. CONTRAST:  149m ISOVUE-300 IOPAMIDOL (ISOVUE-300) INJECTION 61% COMPARISON:  PET-CT 07/03/2018 FINDINGS: Lower chest: Consolidation with air bronchograms in the right lower lung, similar to previous study. Progression of consolidation in the left lower lung since previous study. Cardiac  enlargement. Hepatobiliary: Diffuse fatty infiltration of the liver. No focal lesions identified. Gallbladder and bile ducts are unremarkable. Pancreas: Unremarkable. No pancreatic ductal dilatation or surrounding inflammatory changes. 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 No change. Adrenals/Urinary Tract: Adrenal glands are unremarkable. Kidneys are normal, without renal calculi, focal lesion, or hydronephrosis. Bladder is unremarkable. Stomach/Bowel: Stomach, small bowel, and colon are not abnormally distended. Stomach and small bowel are mostly decompressed. Stool-filled colon. Anastomosis at the ascending colon. Infiltration and edema around the rectum and in the presacral fat. This is new since previous study. Possibly postoperative or due to inflammatory process such as stercoral colitis. Vascular/Lymphatic: Aortic atherosclerosis. No enlarged abdominal or pelvic lymph nodes. Reproductive: Status post hysterectomy. No adnexal masses. Other: Small periumbilical hernia containing fat. Small left inguinal hernia containing fat. No free air or free fluid. Musculoskeletal: Degenerative changes in the spine. No destructive bone lesions. IMPRESSION: 1. Progression of consolidation in the left lower lung since previous study. Persistent consolidation in the right lung base. 2. Diffuse fatty infiltration of the liver. 3. 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 No change since previous study. 4. Stool-filled colon. Infiltration and edema around the rectum and in the presacral fat. This may be postoperative or due to inflammatory process such as stercoral colitis. Aortic Atherosclerosis (ICD10-I70.0). Electronically Signed   By: WLucienne CapersM.D.   On: 08/01/2018 01:56    ASSESSMENT AND PLAN: This is a very pleasant 77years old white female with stage IV non-small cell lung cancer,  adenocarcinoma with positive RET mutation and PDL 1 expression of 5%. The patient was a started on treatment with immunotherapy with single agent Ketruda (pembrolizumab) status post 2 cycles. The patient continues to tolerate this treatment well with no concerning adverse effects. I recommended for her to proceed with cycle #3 today as a scheduled. I will see her back for follow-up visit in 3 weeks for evaluation before starting cycle #4 with repeat CT scan of the chest, abdomen and pelvis for restaging of her disease. If her scan showed evidence for disease progression, I may consider The patient for compassionate use of RET mutation inhibitor. She was advised to call immediately if she has any concerning symptoms in the interval. The patient voices understanding of current disease status and treatment options and is in agreement with the current care plan.  All questions were answered. The patient knows to call the clinic with any problems, questions or concerns.  We can certainly see the patient much sooner if necessary.  Disclaimer: This note was dictated with voice recognition software. Similar sounding words can inadvertently be transcribed and may not be corrected upon review.

## 2018-08-26 NOTE — Telephone Encounter (Signed)
Appts scheduled contrast w/ instructions provided/ avs/calendar printed per 11/6 los

## 2018-08-26 NOTE — Patient Instructions (Signed)
[facility] Discharge Instructions for Patients Receiving Chemotherapy  Today you received the following chemotherapy agents:  Keytruda.  To help prevent nausea and vomiting after your treatment, we encourage you to take your nausea medication as directed.   If you develop nausea and vomiting that is not controlled by your nausea medication, call the clinic.   BELOW ARE SYMPTOMS THAT SHOULD BE REPORTED IMMEDIATELY:  *FEVER GREATER THAN 100.5 F  *CHILLS WITH OR WITHOUT FEVER  NAUSEA AND VOMITING THAT IS NOT CONTROLLED WITH YOUR NAUSEA MEDICATION  *UNUSUAL SHORTNESS OF BREATH  *UNUSUAL BRUISING OR BLEEDING  TENDERNESS IN MOUTH AND THROAT WITH OR WITHOUT PRESENCE OF ULCERS  *URINARY PROBLEMS  *BOWEL PROBLEMS  UNUSUAL RASH Items with * indicate a potential emergency and should be followed up as soon as possible.  Feel free to call the clinic should you have any questions or concerns. The clinic phone number is [phone].  Please show the CHEMO ALERT CARD at check-in to the Emergency Department and triage nurse.    

## 2018-08-27 DIAGNOSIS — D649 Anemia, unspecified: Secondary | ICD-10-CM | POA: Diagnosis not present

## 2018-08-27 DIAGNOSIS — C3431 Malignant neoplasm of lower lobe, right bronchus or lung: Secondary | ICD-10-CM | POA: Diagnosis not present

## 2018-08-27 DIAGNOSIS — L930 Discoid lupus erythematosus: Secondary | ICD-10-CM | POA: Diagnosis not present

## 2018-08-27 DIAGNOSIS — J449 Chronic obstructive pulmonary disease, unspecified: Secondary | ICD-10-CM | POA: Diagnosis not present

## 2018-08-27 DIAGNOSIS — J9621 Acute and chronic respiratory failure with hypoxia: Secondary | ICD-10-CM | POA: Diagnosis not present

## 2018-08-27 DIAGNOSIS — I10 Essential (primary) hypertension: Secondary | ICD-10-CM | POA: Diagnosis not present

## 2018-08-28 DIAGNOSIS — C3431 Malignant neoplasm of lower lobe, right bronchus or lung: Secondary | ICD-10-CM | POA: Diagnosis not present

## 2018-08-28 DIAGNOSIS — J449 Chronic obstructive pulmonary disease, unspecified: Secondary | ICD-10-CM | POA: Diagnosis not present

## 2018-08-28 DIAGNOSIS — J9621 Acute and chronic respiratory failure with hypoxia: Secondary | ICD-10-CM | POA: Diagnosis not present

## 2018-08-28 DIAGNOSIS — I10 Essential (primary) hypertension: Secondary | ICD-10-CM | POA: Diagnosis not present

## 2018-08-28 DIAGNOSIS — L930 Discoid lupus erythematosus: Secondary | ICD-10-CM | POA: Diagnosis not present

## 2018-08-28 DIAGNOSIS — D649 Anemia, unspecified: Secondary | ICD-10-CM | POA: Diagnosis not present

## 2018-08-31 DIAGNOSIS — L930 Discoid lupus erythematosus: Secondary | ICD-10-CM | POA: Diagnosis not present

## 2018-08-31 DIAGNOSIS — D649 Anemia, unspecified: Secondary | ICD-10-CM | POA: Diagnosis not present

## 2018-08-31 DIAGNOSIS — J9621 Acute and chronic respiratory failure with hypoxia: Secondary | ICD-10-CM | POA: Diagnosis not present

## 2018-08-31 DIAGNOSIS — C3431 Malignant neoplasm of lower lobe, right bronchus or lung: Secondary | ICD-10-CM | POA: Diagnosis not present

## 2018-08-31 DIAGNOSIS — I10 Essential (primary) hypertension: Secondary | ICD-10-CM | POA: Diagnosis not present

## 2018-08-31 DIAGNOSIS — J449 Chronic obstructive pulmonary disease, unspecified: Secondary | ICD-10-CM | POA: Diagnosis not present

## 2018-09-01 ENCOUNTER — Telehealth: Payer: Self-pay | Admitting: Medical Oncology

## 2018-09-01 DIAGNOSIS — L930 Discoid lupus erythematosus: Secondary | ICD-10-CM | POA: Diagnosis not present

## 2018-09-01 DIAGNOSIS — J449 Chronic obstructive pulmonary disease, unspecified: Secondary | ICD-10-CM | POA: Diagnosis not present

## 2018-09-01 DIAGNOSIS — J9621 Acute and chronic respiratory failure with hypoxia: Secondary | ICD-10-CM | POA: Diagnosis not present

## 2018-09-01 DIAGNOSIS — I10 Essential (primary) hypertension: Secondary | ICD-10-CM | POA: Diagnosis not present

## 2018-09-01 DIAGNOSIS — D649 Anemia, unspecified: Secondary | ICD-10-CM | POA: Diagnosis not present

## 2018-09-01 DIAGNOSIS — C3431 Malignant neoplasm of lower lobe, right bronchus or lung: Secondary | ICD-10-CM | POA: Diagnosis not present

## 2018-09-01 NOTE — Telephone Encounter (Signed)
Problem resolved with dentist.

## 2018-09-02 DIAGNOSIS — L930 Discoid lupus erythematosus: Secondary | ICD-10-CM | POA: Diagnosis not present

## 2018-09-02 DIAGNOSIS — D649 Anemia, unspecified: Secondary | ICD-10-CM | POA: Diagnosis not present

## 2018-09-02 DIAGNOSIS — I10 Essential (primary) hypertension: Secondary | ICD-10-CM | POA: Diagnosis not present

## 2018-09-02 DIAGNOSIS — J9621 Acute and chronic respiratory failure with hypoxia: Secondary | ICD-10-CM | POA: Diagnosis not present

## 2018-09-02 DIAGNOSIS — C3431 Malignant neoplasm of lower lobe, right bronchus or lung: Secondary | ICD-10-CM | POA: Diagnosis not present

## 2018-09-02 DIAGNOSIS — J449 Chronic obstructive pulmonary disease, unspecified: Secondary | ICD-10-CM | POA: Diagnosis not present

## 2018-09-03 DIAGNOSIS — C3431 Malignant neoplasm of lower lobe, right bronchus or lung: Secondary | ICD-10-CM | POA: Diagnosis not present

## 2018-09-03 DIAGNOSIS — J449 Chronic obstructive pulmonary disease, unspecified: Secondary | ICD-10-CM | POA: Diagnosis not present

## 2018-09-03 DIAGNOSIS — L930 Discoid lupus erythematosus: Secondary | ICD-10-CM | POA: Diagnosis not present

## 2018-09-03 DIAGNOSIS — I10 Essential (primary) hypertension: Secondary | ICD-10-CM | POA: Diagnosis not present

## 2018-09-03 DIAGNOSIS — D649 Anemia, unspecified: Secondary | ICD-10-CM | POA: Diagnosis not present

## 2018-09-03 DIAGNOSIS — J9621 Acute and chronic respiratory failure with hypoxia: Secondary | ICD-10-CM | POA: Diagnosis not present

## 2018-09-07 DIAGNOSIS — L930 Discoid lupus erythematosus: Secondary | ICD-10-CM | POA: Diagnosis not present

## 2018-09-07 DIAGNOSIS — J449 Chronic obstructive pulmonary disease, unspecified: Secondary | ICD-10-CM | POA: Diagnosis not present

## 2018-09-07 DIAGNOSIS — D649 Anemia, unspecified: Secondary | ICD-10-CM | POA: Diagnosis not present

## 2018-09-07 DIAGNOSIS — J9621 Acute and chronic respiratory failure with hypoxia: Secondary | ICD-10-CM | POA: Diagnosis not present

## 2018-09-07 DIAGNOSIS — I10 Essential (primary) hypertension: Secondary | ICD-10-CM | POA: Diagnosis not present

## 2018-09-07 DIAGNOSIS — C3431 Malignant neoplasm of lower lobe, right bronchus or lung: Secondary | ICD-10-CM | POA: Diagnosis not present

## 2018-09-08 DIAGNOSIS — I10 Essential (primary) hypertension: Secondary | ICD-10-CM | POA: Diagnosis not present

## 2018-09-08 DIAGNOSIS — D649 Anemia, unspecified: Secondary | ICD-10-CM | POA: Diagnosis not present

## 2018-09-08 DIAGNOSIS — J9621 Acute and chronic respiratory failure with hypoxia: Secondary | ICD-10-CM | POA: Diagnosis not present

## 2018-09-08 DIAGNOSIS — L930 Discoid lupus erythematosus: Secondary | ICD-10-CM | POA: Diagnosis not present

## 2018-09-08 DIAGNOSIS — J449 Chronic obstructive pulmonary disease, unspecified: Secondary | ICD-10-CM | POA: Diagnosis not present

## 2018-09-08 DIAGNOSIS — C3431 Malignant neoplasm of lower lobe, right bronchus or lung: Secondary | ICD-10-CM | POA: Diagnosis not present

## 2018-09-09 DIAGNOSIS — J9621 Acute and chronic respiratory failure with hypoxia: Secondary | ICD-10-CM | POA: Diagnosis not present

## 2018-09-09 DIAGNOSIS — I10 Essential (primary) hypertension: Secondary | ICD-10-CM | POA: Diagnosis not present

## 2018-09-09 DIAGNOSIS — J449 Chronic obstructive pulmonary disease, unspecified: Secondary | ICD-10-CM | POA: Diagnosis not present

## 2018-09-09 DIAGNOSIS — L930 Discoid lupus erythematosus: Secondary | ICD-10-CM | POA: Diagnosis not present

## 2018-09-09 DIAGNOSIS — D649 Anemia, unspecified: Secondary | ICD-10-CM | POA: Diagnosis not present

## 2018-09-09 DIAGNOSIS — C3431 Malignant neoplasm of lower lobe, right bronchus or lung: Secondary | ICD-10-CM | POA: Diagnosis not present

## 2018-09-10 DIAGNOSIS — L930 Discoid lupus erythematosus: Secondary | ICD-10-CM | POA: Diagnosis not present

## 2018-09-10 DIAGNOSIS — J449 Chronic obstructive pulmonary disease, unspecified: Secondary | ICD-10-CM | POA: Diagnosis not present

## 2018-09-10 DIAGNOSIS — I10 Essential (primary) hypertension: Secondary | ICD-10-CM | POA: Diagnosis not present

## 2018-09-10 DIAGNOSIS — J9621 Acute and chronic respiratory failure with hypoxia: Secondary | ICD-10-CM | POA: Diagnosis not present

## 2018-09-10 DIAGNOSIS — D649 Anemia, unspecified: Secondary | ICD-10-CM | POA: Diagnosis not present

## 2018-09-10 DIAGNOSIS — C3431 Malignant neoplasm of lower lobe, right bronchus or lung: Secondary | ICD-10-CM | POA: Diagnosis not present

## 2018-09-14 ENCOUNTER — Ambulatory Visit (HOSPITAL_COMMUNITY)
Admission: RE | Admit: 2018-09-14 | Discharge: 2018-09-14 | Disposition: A | Discharge status: Home / Self Care | Payer: Medicare Other | Source: Ambulatory Visit | Attending: Internal Medicine | Admitting: Internal Medicine

## 2018-09-14 ENCOUNTER — Encounter (HOSPITAL_COMMUNITY): Payer: Self-pay

## 2018-09-14 DIAGNOSIS — C3490 Malignant neoplasm of unspecified part of unspecified bronchus or lung: Secondary | ICD-10-CM | POA: Insufficient documentation

## 2018-09-14 MED ORDER — SODIUM CHLORIDE (PF) 0.9 % IJ SOLN
INTRAMUSCULAR | Status: AC
  Filled 2018-09-14: qty 50

## 2018-09-14 MED ORDER — IOHEXOL 300 MG/ML  SOLN
100.0000 mL | Freq: Once | INTRAMUSCULAR | Status: AC | PRN
  Administered 2018-09-14: 100 mL via INTRAVENOUS

## 2018-09-15 DIAGNOSIS — C3431 Malignant neoplasm of lower lobe, right bronchus or lung: Secondary | ICD-10-CM | POA: Diagnosis not present

## 2018-09-15 DIAGNOSIS — J449 Chronic obstructive pulmonary disease, unspecified: Secondary | ICD-10-CM | POA: Diagnosis not present

## 2018-09-15 DIAGNOSIS — I10 Essential (primary) hypertension: Secondary | ICD-10-CM | POA: Diagnosis not present

## 2018-09-15 DIAGNOSIS — D649 Anemia, unspecified: Secondary | ICD-10-CM | POA: Diagnosis not present

## 2018-09-15 DIAGNOSIS — J9621 Acute and chronic respiratory failure with hypoxia: Secondary | ICD-10-CM | POA: Diagnosis not present

## 2018-09-15 DIAGNOSIS — L930 Discoid lupus erythematosus: Secondary | ICD-10-CM | POA: Diagnosis not present

## 2018-09-16 ENCOUNTER — Encounter: Payer: Self-pay | Admitting: Internal Medicine

## 2018-09-16 ENCOUNTER — Inpatient Hospital Stay: Payer: Medicare Other

## 2018-09-16 ENCOUNTER — Inpatient Hospital Stay (HOSPITAL_BASED_OUTPATIENT_CLINIC_OR_DEPARTMENT_OTHER): Payer: Medicare Other | Admitting: Internal Medicine

## 2018-09-16 VITALS — BP 100/51 | HR 102 | Temp 97.4°F | Resp 17 | Ht 65.0 in | Wt 146.6 lb

## 2018-09-16 DIAGNOSIS — J449 Chronic obstructive pulmonary disease, unspecified: Secondary | ICD-10-CM | POA: Diagnosis not present

## 2018-09-16 DIAGNOSIS — C3491 Malignant neoplasm of unspecified part of right bronchus or lung: Secondary | ICD-10-CM

## 2018-09-16 DIAGNOSIS — Z7189 Other specified counseling: Secondary | ICD-10-CM

## 2018-09-16 DIAGNOSIS — I700 Atherosclerosis of aorta: Secondary | ICD-10-CM | POA: Diagnosis not present

## 2018-09-16 DIAGNOSIS — C3431 Malignant neoplasm of lower lobe, right bronchus or lung: Secondary | ICD-10-CM

## 2018-09-16 DIAGNOSIS — Z79899 Other long term (current) drug therapy: Secondary | ICD-10-CM | POA: Diagnosis not present

## 2018-09-16 DIAGNOSIS — Z5112 Encounter for antineoplastic immunotherapy: Secondary | ICD-10-CM

## 2018-09-16 DIAGNOSIS — I10 Essential (primary) hypertension: Secondary | ICD-10-CM

## 2018-09-16 DIAGNOSIS — I708 Atherosclerosis of other arteries: Secondary | ICD-10-CM | POA: Diagnosis not present

## 2018-09-16 DIAGNOSIS — R5382 Chronic fatigue, unspecified: Secondary | ICD-10-CM

## 2018-09-16 DIAGNOSIS — Z5111 Encounter for antineoplastic chemotherapy: Secondary | ICD-10-CM | POA: Diagnosis not present

## 2018-09-16 DIAGNOSIS — I119 Hypertensive heart disease without heart failure: Secondary | ICD-10-CM | POA: Diagnosis not present

## 2018-09-16 LAB — CBC WITH DIFFERENTIAL (CANCER CENTER ONLY)
ABS IMMATURE GRANULOCYTES: 0.01 10*3/uL (ref 0.00–0.07)
Basophils Absolute: 0 10*3/uL (ref 0.0–0.1)
Basophils Relative: 0 %
EOS PCT: 1 %
Eosinophils Absolute: 0.1 10*3/uL (ref 0.0–0.5)
HEMATOCRIT: 31.1 % — AB (ref 36.0–46.0)
HEMOGLOBIN: 9.8 g/dL — AB (ref 12.0–15.0)
Immature Granulocytes: 0 %
LYMPHS ABS: 0.3 10*3/uL — AB (ref 0.7–4.0)
LYMPHS PCT: 7 %
MCH: 28.7 pg (ref 26.0–34.0)
MCHC: 31.5 g/dL (ref 30.0–36.0)
MCV: 90.9 fL (ref 80.0–100.0)
MONO ABS: 0.4 10*3/uL (ref 0.1–1.0)
Monocytes Relative: 8 %
NEUTROS ABS: 3.8 10*3/uL (ref 1.7–7.7)
Neutrophils Relative %: 84 %
Platelet Count: 197 10*3/uL (ref 150–400)
RBC: 3.42 MIL/uL — ABNORMAL LOW (ref 3.87–5.11)
RDW: 16.5 % — ABNORMAL HIGH (ref 11.5–15.5)
WBC: 4.5 10*3/uL (ref 4.0–10.5)
nRBC: 0 % (ref 0.0–0.2)

## 2018-09-16 LAB — CMP (CANCER CENTER ONLY)
ALK PHOS: 163 U/L — AB (ref 38–126)
ALT: 9 U/L (ref 0–44)
AST: 24 U/L (ref 15–41)
Albumin: 2.3 g/dL — ABNORMAL LOW (ref 3.5–5.0)
Anion gap: 11 (ref 5–15)
BILIRUBIN TOTAL: 0.5 mg/dL (ref 0.3–1.2)
BUN: 11 mg/dL (ref 8–23)
CALCIUM: 9 mg/dL (ref 8.9–10.3)
CO2: 23 mmol/L (ref 22–32)
CREATININE: 0.69 mg/dL (ref 0.44–1.00)
Chloride: 99 mmol/L (ref 98–111)
GFR, Estimated: >60 mL/min (ref >60)
GLUCOSE: 113 mg/dL — AB (ref 70–99)
Potassium: 4 mmol/L (ref 3.5–5.1)
Sodium: 133 mmol/L — ABNORMAL LOW (ref 135–145)
TOTAL PROTEIN: 7.4 g/dL (ref 6.5–8.1)

## 2018-09-16 LAB — TSH: TSH: 2.148 u[IU]/mL (ref 0.308–3.960)

## 2018-09-16 MED ORDER — HYDROCODONE-ACETAMINOPHEN 5-325 MG PO TABS: 1.0000 | ORAL_TABLET | Freq: Four times a day (QID) | ORAL | 0 refills | Status: AC | PRN

## 2018-09-16 NOTE — Progress Notes (Signed)
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 Molecular studies showed positive RET rearrangement and PDL 1 expression of 5%.  PRIOR THERAPY: None  CURRENT THERAPY: First-line treatment with immunotherapy with Keytruda 200 mg IV every 3 weeks.  First dose 07/15/2018.  Status post 3 cycles.  INTERVAL HISTORY: Casey Wilkinson 77 y.o. female returns to the clinic today for follow-up visit accompanied by her husband and niece.  The patient is feeling fine today except for the baseline shortness of breath.  She denied having any chest pain, cough or hemoptysis.  She continues to have intermittent abdominal pain mainly on the right upper quadrant.  She has no significant weight loss or night sweats.  She has no nausea, vomiting, diarrhea or constipation.  She tolerated the first 3 cycles of her treatment with Keytruda fairly well.  The patient had repeat CT scan of the chest, abdomen and pelvis performed recently and she is here for evaluation and discussion of her risk her results.  MEDICAL HISTORY: Past Medical History:  Diagnosis Date  . Anemia   . Arthritis    "all over"  . Carotid artery stenosis    right side followed by [facility]- 65%  . Colitis April 2016  . Colon cancer ([facility])   . COPD (chronic obstructive pulmonary disease) ([facility])   . Discoid lupus erythematosus   . Diverticulitis April 2016   and Colitis  . History of blood transfusion 01/2015, 06/2015   colectomy  . Hypertension   . Psoriatic arthritis ([facility])   . Shortness of breath dyspnea    with exertion  . Squamous cell carcinoma, leg     ALLERGIES:  is allergic to prochlorperazine edisylate;  sulfamethoxazole; and doxycycline.  MEDICATIONS:  Current Outpatient Medications  Medication Sig Dispense Refill  . acetaminophen (TYLENOL) 500 MG tablet Take 1,000 mg by mouth daily as needed for mild pain.    Marland Kitchen albuterol (PROAIR HFA) 108 (90 Base) MCG/ACT inhaler Inhale 2 puffs into the lungs every 4 (four) hours as needed for wheezing or shortness of breath. 3 Inhaler 1  . atorvastatin (LIPITOR) 10 MG tablet Take 10 mg by mouth daily.    . Calcium Carbonate-Vitamin D 600-400 MG-UNIT per tablet Take 1 tablet by mouth at bedtime.     . Cholecalciferol (VITAMIN D3) 2000 units capsule Take 2,000 Units by mouth daily.    . Cyanocobalamin (VITAMIN B-12) 5000 MCG TBDP Take 2,500 mcg by mouth at bedtime.    . famotidine (PEPCID) 20 MG tablet Take 20 mg by mouth 2 (two) times daily.    . feeding supplement, ENSURE ENLIVE, (ENSURE ENLIVE) LIQD Take 237 mLs by mouth 3 (three) times daily between meals. (Patient taking differently: Take 237 mLs by mouth daily as needed (meal supplement). ) 697 mL 12  . folic acid (FOLVITE) 1 MG tablet Take 1 mg by mouth every morning.     Marland Kitchen guaiFENesin (MUCINEX) 600 MG 12 hr tablet Take 600 mg by mouth 2 (two) times daily.     Marland Kitchen HYDROcodone-acetaminophen (NORCO/VICODIN) 5-325 MG tablet Take 1 tablet by mouth every 6 (six) hours as needed for moderate pain.    Marland Kitchen HYDROcodone-homatropine (HYCODAN) 5-1.5 MG/5ML  syrup Take 5 mLs by mouth at bedtime and may repeat dose one time if needed. (Patient taking differently: Take 5 mLs by mouth at bedtime. ) 120 mL 0  . ipratropium-albuterol (DUONEB) 0.5-2.5 (3) MG/3ML SOLN Take 3 mLs by nebulization every 4 (four) hours as needed (shortness of breath/wheezing). (Patient taking differently: Take 3 mLs by nebulization every 8 (eight) hours. ) 360 mL 0  . iron polysaccharides (NIFEREX) 150 MG capsule Take 150 mg by mouth daily.    Marland Kitchen lidocaine (XYLOCAINE) 2 % solution caregiver: Mix 1part 2% viscous lidocaine, 1part H20. Swallow 58m of  diluted mixture, 32m before meals and at bedtime, up to QID (Patient taking differently: Use as directed 10 mLs in the mouth or throat 4 (four) times daily as needed for mouth pain. Mix 1part 2% viscous lidocaine, 1part H20. Swallow 1012mf diluted mixture, 74m42mefore meals and at bedtime, up to QID) 150 mL 2  . loratadine (CLARITIN) 10 MG tablet Take 10 mg by mouth daily.    . LOMarland Kitchenazepam (ATIVAN) 0.5 MG tablet Take 0.5 mg by mouth 2 (two) times daily as needed for anxiety.    . metoprolol tartrate (LOPRESSOR) 25 MG tablet Take 1 tablet (25 mg total) by mouth 2 (two) times daily. (Patient not taking: Reported on 08/13/2018) 60 tablet 0  . mirtazapine (REMERON) 7.5 MG tablet Take 1 tablet (7.5 mg total) by mouth at bedtime. 10 tablet 0  . Multiple Vitamin (MULTIVITAMIN WITH MINERALS) TABS tablet Take 1 tablet by mouth daily.    . ondansetron (ZOFRAN) 4 MG tablet Take 4 mg by mouth every 6 (six) hours as needed for nausea or vomiting.    . polyethylene glycol (MIRALAX / GLYCOLAX) packet Take 17 g by mouth daily as needed for mild constipation.     . Potassium 99 MG TABS Take 99 mg by mouth daily.    . sennosides-docusate sodium (SENOKOT-S) 8.6-50 MG tablet Take 1 tablet by mouth at bedtime.     . traMADol (ULTRAM) 50 MG tablet Take 1 tablet (50 mg total) by mouth every 6 (six) hours as needed for moderate pain. 14 tablet 0  . TRELEGY ELLIPTA 100-62.5-25 MCG/INH AEPB USE 1 INHALATION DAILY (Patient taking differently: Inhale 1 puff into the lungs daily. ) 180 each 1  . valsartan-hydrochlorothiazide (DIOVAN-HCT) 160-12.5 MG tablet Take 1 tablet by mouth daily.     No current facility-administered medications for this visit.     SURGICAL HISTORY:  Past Surgical History:  Procedure Laterality Date  . APPENDECTOMY  06/2015  . CATARACT EXTRACTION W/ INTRAOCULAR LENS  IMPLANT, BILATERAL Bilateral 2017  . COLONOSCOPY  06/23/15  . COLONOSCOPY W/ POLYPECTOMY    . CYSTOCELE REPAIR  2006  . DILATION AND  CURETTAGE OF UTERUS  X 2  . EXCISIONAL HEMORRHOIDECTOMY  2006  . JOINT REPLACEMENT    . LAPAROSCOPIC PARTIAL COLECTOMY N/A 06/23/2015   Procedure: LAPAROSCOPIC ILEOCECETOMY;  Surgeon: BenjExcell Seltzer;  Location: [facility];  Service: General;  Laterality: N/A;  . MOHS SURGERY Left    "basal; LLE"  . RECTOCELE REPAIR  2006  . SQUAMOUS CELL CARCINOMA EXCISION  "several"   "legs mostly"  . TOTAL KNEE ARTHROPLASTY Left 01/31/2016   Procedure: LEFT TOTAL KNEE ARTHROPLASTY;  Surgeon: NaipLeandrew Koyanagi;  [facility];  Laterality: Left;  . VAMarland KitchenINAL HYSTERECTOMY  1968   "total"  . VIDEO BRONCHOSCOPY Bilateral 05/06/2018   Procedure: VIDEO BRONCHOSCOPY WITH FLUORO;  Surgeon: ByruBaltazar Apo  S, MD;  Location: [facility];  Service: Cardiopulmonary;  Laterality: Bilateral;    REVIEW OF SYSTEMS:  Constitutional: positive for fatigue Eyes: negative Ears, nose, mouth, throat, and face: negative Respiratory: positive for dyspnea on exertion Cardiovascular: negative Gastrointestinal: negative Genitourinary:negative Integument/breast: negative Hematologic/lymphatic: negative Musculoskeletal:negative Neurological: negative Behavioral/Psych: negative Endocrine: negative Allergic/Immunologic: negative   PHYSICAL EXAMINATION: General appearance: alert, cooperative, fatigued and no distress Head: Normocephalic, without obvious abnormality, atraumatic Neck: no adenopathy, no JVD, supple, symmetrical, trachea midline and thyroid not enlarged, symmetric, no tenderness/mass/nodules Lymph nodes: Cervical, supraclavicular, and axillary nodes normal. Resp: wheezes bilaterally Back: symmetric, no curvature. ROM normal. No CVA tenderness. Cardio: regular rate and rhythm, S1, S2 normal, no murmur, click, rub or gallop GI: soft, non-tender; bowel sounds normal; no masses,  no organomegaly Extremities: extremities normal, atraumatic, no cyanosis or edema Neurologic: Alert and oriented X  3, normal strength and tone. Normal symmetric reflexes. Normal coordination and gait  ECOG PERFORMANCE STATUS: 1 - Symptomatic but completely ambulatory  Blood pressure (!) 100/51, pulse (!) 102, temperature (!) 97.4 F (36.3 C), temperature source Oral, resp. rate 17, height _0  (1.651 m), weight 146 lb 9.6 oz (66.5 kg), SpO2 100 %, peak flow (!) 4 L/min.  LABORATORY DATA: Lab Results  Component Value Date   WBC 4.5 09/16/2018   HGB 9.8 (L) 09/16/2018   HCT 31.1 (L) 09/16/2018   MCV 90.9 09/16/2018   PLT 197 09/16/2018      Chemistry      Component Value Date/Time   NA 133 (L) 09/16/2018 0836   K 4.0 09/16/2018 0836   CL 99 09/16/2018 0836   CO2 23 09/16/2018 0836   BUN 11 09/16/2018 0836   CREATININE 0.69 09/16/2018 0836      Component Value Date/Time   CALCIUM 9.0 09/16/2018 0836   ALKPHOS 163 (H) 09/16/2018 0836   AST 24 09/16/2018 0836   ALT 9 09/16/2018 0836   BILITOT 0.5 09/16/2018 0836       RADIOGRAPHIC STUDIES: Ct Chest W Contrast  Result Date: 09/14/2018 CLINICAL DATA:  Right lung cancer restaging, completed radiation therapy and ongoing chemotherapy. History of colon cancer treated surgically. EXAM: CT CHEST, ABDOMEN, AND PELVIS WITH CONTRAST TECHNIQUE: Multidetector CT imaging of the chest, abdomen and pelvis was performed following the standard protocol during bolus administration of intravenous contrast. CONTRAST:  12m OMNIPAQUE IOHEXOL 300 MG/ML  SOLN COMPARISON:  Multiple exams, including 08/13/2018 and PET-CT of 07/03/2018 FINDINGS: CT CHEST FINDINGS Cardiovascular: Coronary, aortic arch, and branch vessel atherosclerotic vascular disease. Small pericardial effusion increased from prior exam. Mediastinum/Nodes: Right hilar node 1.2 cm in short axis on image 31/2, previously 0.9 cm. Right lower subcarinal node 1.1 cm in short axis on image 35/2, formerly 0.7 cm on 06/14/2018. Small AP window lymph nodes are present. Mild wall thickening of the distal  esophagus. Lungs/Pleura: New small to moderate right pleural effusion with some loculation. Severe centrilobular emphysema. Extensive consolidation in the right lower lobe with architectural distortion, air bronchograms, and masslike densities as shown on prior exams including 08/13/2018. There is progressive volume loss and consolidation in the right middle lobe, increased from 08/13/2018. A right upper lobe nodule measuring 1.1 by 0.8 cm on image 74/4 previously measured approximately the same on 07/03/2018. Chronic atelectasis medially in the left lower lobe. Musculoskeletal: Unremarkable CT ABDOMEN PELVIS FINDINGS Hepatobiliary: 2.4 by 2.3 cm hypodense lesion posteriorly in segment 4 of the liver on image 56/2 demonstrating increasing conspicuity over the last several months. There  is no appreciable hyperintensity in this region on the prior PET-CT but also the lesion was not very conspicuous on the CT data at that time. Mild prominence of size of the lateral segment left hepatic lobe. Gallbladder unremarkable. Pancreas: Unremarkable Spleen: There is a band of low-density along the periphery of the spleen indicating site of prior infarct. Adrenals/Urinary Tract: Small adrenal nodule 10 mm in thickness on image 58/2, stable back through 2016 hence likely benign. Mild scarring of the right kidney lower pole. Stomach/Bowel: Clinical concern was raised for a anorectal mass on the prior PET-CT, there is density in this vicinity for example on image 112/2 and a mass is not readily excluded. Vascular/Lymphatic: Aortoiliac atherosclerotic vascular disease. No pathologic adenopathy observed in the abdomen/pelvis. Reproductive: Uterus absent.  Adnexa unremarkable. Other: No supplemental non-categorized findings. Musculoskeletal: Degenerative subcortical cyst formation in the left acetabulum. Degenerative endplate disease and degenerative disc disease at L5-S1 with mild bilateral foraminal impingement. Small umbilical  hernia contains adipose tissue. A small left groin hernia contains adipose tissue. IMPRESSION: 1. Progressive hypodensity posteriorly in segment 4 of the liver measuring 2.4 by 2.3 cm. The appearance is suspicious for metastatic malignancy, focal fatty infiltration is a less likely differential diagnostic consideration given the location and appearance. 2. New small to moderate right pleural effusion with some loculation. 3. Accounting for the resulting passive atelectasis, there is stable consolidation and masslike architectural distortion in the right lower lobe. 4. Increased consolidation and atelectasis in the right middle lobe compared to 08/13/2018. Stable right upper lobe nodule. 5. Anorectal soft tissue prominence at a site where there was previously high metabolic activity on PET-CT, correlate with anorectal exam in assessing for malignancy in this area. 6. Small pericardial effusion, increased from prior. 7. Other imaging findings of potential clinical significance: Aortic Atherosclerosis (ICD10-I70.0) and Emphysema (ICD10-J43.9). Coronary atherosclerosis. Wall thickening in the distal esophagus potentially from esophagitis. Evolving splenic infarct. Chronically stable 10 mm left adrenal nodule, likely benign. Mild foraminal impingement at L5-S1. Small umbilical and left groin hernias containing adipose tissue. Electronically Signed   By: Van Clines M.D.   On: 09/14/2018 14:51   Ct Abdomen Pelvis W Contrast  Result Date: 09/14/2018 CLINICAL DATA:  Right lung cancer restaging, completed radiation therapy and ongoing chemotherapy. History of colon cancer treated surgically. EXAM: CT CHEST, ABDOMEN, AND PELVIS WITH CONTRAST TECHNIQUE: Multidetector CT imaging of the chest, abdomen and pelvis was performed following the standard protocol during bolus administration of intravenous contrast. CONTRAST:  169m OMNIPAQUE IOHEXOL 300 MG/ML  SOLN COMPARISON:  Multiple exams, including 08/13/2018 and  PET-CT of 07/03/2018 FINDINGS: CT CHEST FINDINGS Cardiovascular: Coronary, aortic arch, and branch vessel atherosclerotic vascular disease. Small pericardial effusion increased from prior exam. Mediastinum/Nodes: Right hilar node 1.2 cm in short axis on image 31/2, previously 0.9 cm. Right lower subcarinal node 1.1 cm in short axis on image 35/2, formerly 0.7 cm on 06/14/2018. Small AP window lymph nodes are present. Mild wall thickening of the distal esophagus. Lungs/Pleura: New small to moderate right pleural effusion with some loculation. Severe centrilobular emphysema. Extensive consolidation in the right lower lobe with architectural distortion, air bronchograms, and masslike densities as shown on prior exams including 08/13/2018. There is progressive volume loss and consolidation in the right middle lobe, increased from 08/13/2018. A right upper lobe nodule measuring 1.1 by 0.8 cm on image 74/4 previously measured approximately the same on 07/03/2018. Chronic atelectasis medially in the left lower lobe. Musculoskeletal: Unremarkable CT ABDOMEN PELVIS FINDINGS Hepatobiliary: 2.4 by  2.3 cm hypodense lesion posteriorly in segment 4 of the liver on image 56/2 demonstrating increasing conspicuity over the last several months. There is no appreciable hyperintensity in this region on the prior PET-CT but also the lesion was not very conspicuous on the CT data at that time. Mild prominence of size of the lateral segment left hepatic lobe. Gallbladder unremarkable. Pancreas: Unremarkable Spleen: There is a band of low-density along the periphery of the spleen indicating site of prior infarct. Adrenals/Urinary Tract: Small adrenal nodule 10 mm in thickness on image 58/2, stable back through 2016 hence likely benign. Mild scarring of the right kidney lower pole. Stomach/Bowel: Clinical concern was raised for a anorectal mass on the prior PET-CT, there is density in this vicinity for example on image 112/2 and a mass is  not readily excluded. Vascular/Lymphatic: Aortoiliac atherosclerotic vascular disease. No pathologic adenopathy observed in the abdomen/pelvis. Reproductive: Uterus absent.  Adnexa unremarkable. Other: No supplemental non-categorized findings. Musculoskeletal: Degenerative subcortical cyst formation in the left acetabulum. Degenerative endplate disease and degenerative disc disease at L5-S1 with mild bilateral foraminal impingement. Small umbilical hernia contains adipose tissue. A small left groin hernia contains adipose tissue. IMPRESSION: 1. Progressive hypodensity posteriorly in segment 4 of the liver measuring 2.4 by 2.3 cm. The appearance is suspicious for metastatic malignancy, focal fatty infiltration is a less likely differential diagnostic consideration given the location and appearance. 2. New small to moderate right pleural effusion with some loculation. 3. Accounting for the resulting passive atelectasis, there is stable consolidation and masslike architectural distortion in the right lower lobe. 4. Increased consolidation and atelectasis in the right middle lobe compared to 08/13/2018. Stable right upper lobe nodule. 5. Anorectal soft tissue prominence at a site where there was previously high metabolic activity on PET-CT, correlate with anorectal exam in assessing for malignancy in this area. 6. Small pericardial effusion, increased from prior. 7. Other imaging findings of potential clinical significance: Aortic Atherosclerosis (ICD10-I70.0) and Emphysema (ICD10-J43.9). Coronary atherosclerosis. Wall thickening in the distal esophagus potentially from esophagitis. Evolving splenic infarct. Chronically stable 10 mm left adrenal nodule, likely benign. Mild foraminal impingement at L5-S1. Small umbilical and left groin hernias containing adipose tissue. Electronically Signed   By: Van Clines M.D.   On: 09/14/2018 14:51    ASSESSMENT AND PLAN: This is a very pleasant 77 years old white female  with stage IV non-small cell lung cancer, adenocarcinoma with positive RET mutation and PDL 1 expression of 5%. The patient was started on treatment with immunotherapy with single agent Ketruda (pembrolizumab) status post 3 cycles. She tolerated this treatment well. She had repeat CT scan of the chest, abdomen and pelvis performed recently.  I personally and independently reviewed the scan images and discussed the results with the patient and her family. Her scan showed some evidence for disease progression with suspicious new liver lesions as well as new small to moderate right pleural effusion and consolidation in the right middle lobe. I discussed with the patient several options for treatment of her condition including adding systemic chemotherapy to her current treatment with Keytruda with carboplatin and Alimta versus trying to start the patient on targeted therapy with LOXO 292 for RET inhibition since the patient has positive RET mutation. The patient and her family are interested and proceeding with a target therapy.  We will try to reach out to the pharmaceutical company to see if we can have the drug on compassionate use basis for treatment of her condition. We will arrange for  the patient to come back for follow-up visit in 2-3 weeks for evaluation with the start of this treatment. She was advised to call immediately if she has any concerning symptoms in the interval. The patient voices understanding of current disease status and treatment options and is in agreement with the current care plan.  All questions were answered. The patient knows to call the clinic with any problems, questions or concerns. We can certainly see the patient much sooner if necessary.  Disclaimer: This note was dictated with voice recognition software. Similar sounding words can inadvertently be transcribed and may not be corrected upon review.

## 2018-09-18 ENCOUNTER — Telehealth: Payer: Self-pay | Admitting: Pharmacist

## 2018-09-18 DIAGNOSIS — L930 Discoid lupus erythematosus: Secondary | ICD-10-CM | POA: Diagnosis not present

## 2018-09-18 DIAGNOSIS — C3431 Malignant neoplasm of lower lobe, right bronchus or lung: Secondary | ICD-10-CM | POA: Diagnosis not present

## 2018-09-18 DIAGNOSIS — J449 Chronic obstructive pulmonary disease, unspecified: Secondary | ICD-10-CM | POA: Diagnosis not present

## 2018-09-18 DIAGNOSIS — D649 Anemia, unspecified: Secondary | ICD-10-CM | POA: Diagnosis not present

## 2018-09-18 DIAGNOSIS — I10 Essential (primary) hypertension: Secondary | ICD-10-CM | POA: Diagnosis not present

## 2018-09-18 DIAGNOSIS — J9621 Acute and chronic respiratory failure with hypoxia: Secondary | ICD-10-CM | POA: Diagnosis not present

## 2018-09-18 NOTE — Telephone Encounter (Addendum)
Oral Oncology Pharmacist Encounter  Received new referral for selpercatinib (LOXO-292, not yet commercially available) for the treatment of unresectable stage III/stage IV non small cell lung cancer, adenocarcinoma, with RET rearrangement, PTEN mutation, and CDKN2A rearragnement, planned duration until disease progression or unacceptable toxicty.  Patient has received 3 cycles of Keytruda (pembrolizumab) with evidence of disease progression on CT scan performed 09/14/2018.  Limited published data show dose use is 172m BID and treatment emergent ADEs to include dry mouth, hypertension, diarrhea, and increases in AST/ALT.  Labs and vitals signs from 09/16/2018 assessed, OK for treatment.  Unable to assess drug interactions with patient's medication list at this time. I will reach out to drug manufacturer for any information on ADME properties for this assessment.  I have contacted [employer] information on medication acquisition. Published case report use single-use protocol approved by the FDA and the institution's IRB board.  Oral Oncology Clinic will continue to follow for medication acquisition, initial counseling and start date.  JJohny Drilling PharmD, BCPS, BCOP  09/18/2018 2:44 PM Oral Oncology Clinic [phone]

## 2018-09-23 NOTE — Telephone Encounter (Signed)
Oral Oncology Pharmacist Encounter  I spoke with patient's niece, Lovey Newcomer, today to alert her that we likely would not be able to provide Loxo-292 here at [facility]. In order to provide medication at this site we would need to hold a single patient IND with the FDA as well as get a protocol approved by our IRB board.  This will not be feasible.  In order for her aunt to go on the medication she will probably need to travel to 1 of the sites of clinical trial. Sites of clinical trial include:  [place], [place], [place], [place] stated that patient is likely not going to want to travel to any of the sites but she will discuss with patient's children. I will attempt to provide [facility] with more information about sites of clinical trials and what would be involved to get screened for those by this coming Friday (09/25/2018).  I did provide Lovey Newcomer with information about treatment emergent side effects and dosing of the medication that was discovered during work-up. We talked about how side effects of this medication and efficacy of this medication appear that it would be a better choice for RET rearrangement than other agents currently on the market that also target this but are not yet approved for this indication.  Johny Drilling, PharmD, BCPS, BCOP  09/23/2018 4:24 PM Oral Oncology Clinic [phone]

## 2018-09-24 DIAGNOSIS — L930 Discoid lupus erythematosus: Secondary | ICD-10-CM | POA: Diagnosis not present

## 2018-09-24 DIAGNOSIS — I10 Essential (primary) hypertension: Secondary | ICD-10-CM | POA: Diagnosis not present

## 2018-09-24 DIAGNOSIS — J9621 Acute and chronic respiratory failure with hypoxia: Secondary | ICD-10-CM | POA: Diagnosis not present

## 2018-09-24 DIAGNOSIS — C3431 Malignant neoplasm of lower lobe, right bronchus or lung: Secondary | ICD-10-CM | POA: Diagnosis not present

## 2018-09-24 DIAGNOSIS — D649 Anemia, unspecified: Secondary | ICD-10-CM | POA: Diagnosis not present

## 2018-09-24 DIAGNOSIS — J449 Chronic obstructive pulmonary disease, unspecified: Secondary | ICD-10-CM | POA: Diagnosis not present

## 2018-09-28 NOTE — Telephone Encounter (Signed)
Oral Oncology Pharmacist Encounter  I reached out to clinical trial contact at [facility] for additional information on expanded access of Loxo-292.  Patient would likely need to be seen at one of the clinical trial sites twice monthly. Imaging will also have to be performed while patient is on trial, this can likely be handled at a local site. Dose of Loxo-292 will either be administered at 160 mg twice daily or at 320 mg daily.  Treatment emergent side effects include dry mouth, diarrhea (2% grade 3), hypertension (14%), and an increase in liver enzymes.  I will contact clinical trial contact at [facility] (Dr. Elease Hashimoto at [email or web address]) in order to establish contact at clinical trial site if patient wishes to move forward with this option.  Above information reported to patient's niece, Casey Wilkinson. They are extremely worried about the cost that would be associated with travel to one of the sites, specifically with questions about whether any travel expenses (including gas and hotel rooms) would be covered through the clinical trial.   I am unsure of this information, but it can be obtained when we get in contact with the clinical trial site, if they choose to pursue this route.  Casey Wilkinson with multiple questions about IV chemotherapy options that would be offered if patient does not wish to try clinical trial at this time. We specifically discussed combinations of carboplatin plus pemetrexed plus pembrolizumab versus carboplatin plus paclitaxel plus pembrolizumab. Casey Wilkinson provided information about possible side effects and incidence with IV chemotherapy. We also discussed side effects and incidence for immunotherapy. Casey Wilkinson reported that patient had not had any adverse events from her 3 cycles of pembrolizumab.  Casey Wilkinson with additional questions about other agents that are commercially available that target RET. We discussed that if one of these other targeted agents is  prescribed, that oral oncology clinic would submit for insurance authorization.   If prior authorization was denied due to off label use, we will attempt to appeal the denial with a letter of medical necessity and clinical data packet If we are unable to overturn any insurance denial with an appeal, we will attempt to obtain one of these commercially available agents through compassionate use program through the manufacturer. Some manufacturers will provide compassionate use medications off-label, some will not. Casey Wilkinson provided information on the details of this entire process.  Casey Wilkinson states that she will reach out to the patient and her children and get back to me if they would like additional information for expanded access of the [place] at a clinical trial site.  I confirmed office visits on 10/07/18 with [place].  Johny Drilling, PharmD, BCPS, BCOP  09/28/2018 11:30 AM Oral Oncology Clinic [phone]

## 2018-10-02 NOTE — Telephone Encounter (Signed)
Oral Oncology Pharmacist Encounter  Received message from patient's niece, Casey Wilkinson, with information that patient does not want to travel to a clinical trial site in order to be started on Loxo-292.  Message also stated that patient does not wish to try IV chemotherapy, but would like additional information about other targeted agents (specificall side effects and incidences) that are commercially available and also target RET.  Discussed above with Dr. Julien Nordmann. Dr. Julien Nordmann will see patient in the office on 10/07/2018, and discuss any treatment options that he thinks are acceptable (effective and tolerable) with patient and her family at that time.  I called Casey Wilkinson and reported my conversation with Dr. Earlie Server. She states she will be in attendance at office visit on 10/07/2018.  All questions answered. Casey Wilkinson expressed understanding and appreciation. She knows to call the office with any additional questions or concerns.  No further needs from [facility] identified at this time. Oral Oncology Clinic will sign off. Please let us know if we can be of assistance in the future.  Johny Drilling, PharmD, BCPS, BCOP  10/02/2018 10:11 AM Oral Oncology Clinic [phone]

## 2018-10-07 ENCOUNTER — Ambulatory Visit: Payer: Medicare Other

## 2018-10-07 ENCOUNTER — Encounter: Payer: Self-pay | Admitting: Internal Medicine

## 2018-10-07 ENCOUNTER — Inpatient Hospital Stay: Payer: Medicare Other

## 2018-10-07 ENCOUNTER — Inpatient Hospital Stay: Payer: Medicare Other | Attending: Internal Medicine | Admitting: Internal Medicine

## 2018-10-07 VITALS — BP 94/53 | HR 115 | Temp 97.4°F | Resp 17 | Ht 65.0 in | Wt 144.6 lb

## 2018-10-07 DIAGNOSIS — C3491 Malignant neoplasm of unspecified part of right bronchus or lung: Secondary | ICD-10-CM

## 2018-10-07 DIAGNOSIS — Z923 Personal history of irradiation: Secondary | ICD-10-CM

## 2018-10-07 DIAGNOSIS — C3431 Malignant neoplasm of lower lobe, right bronchus or lung: Secondary | ICD-10-CM | POA: Diagnosis not present

## 2018-10-07 DIAGNOSIS — R5382 Chronic fatigue, unspecified: Secondary | ICD-10-CM

## 2018-10-07 DIAGNOSIS — Z7189 Other specified counseling: Secondary | ICD-10-CM

## 2018-10-07 DIAGNOSIS — I10 Essential (primary) hypertension: Secondary | ICD-10-CM | POA: Diagnosis not present

## 2018-10-07 DIAGNOSIS — Z79899 Other long term (current) drug therapy: Secondary | ICD-10-CM | POA: Diagnosis not present

## 2018-10-07 DIAGNOSIS — J449 Chronic obstructive pulmonary disease, unspecified: Secondary | ICD-10-CM

## 2018-10-07 DIAGNOSIS — Z90710 Acquired absence of both cervix and uterus: Secondary | ICD-10-CM

## 2018-10-07 DIAGNOSIS — C3411 Malignant neoplasm of upper lobe, right bronchus or lung: Secondary | ICD-10-CM | POA: Diagnosis present

## 2018-10-07 LAB — CMP (CANCER CENTER ONLY)
ALT: 15 U/L (ref 0–44)
AST: 24 U/L (ref 15–41)
Albumin: 2.4 g/dL — ABNORMAL LOW (ref 3.5–5.0)
Alkaline Phosphatase: 185 U/L — ABNORMAL HIGH (ref 38–126)
Anion gap: 7 (ref 5–15)
BILIRUBIN TOTAL: 0.5 mg/dL (ref 0.3–1.2)
BUN: 12 mg/dL (ref 8–23)
CHLORIDE: 99 mmol/L (ref 98–111)
CO2: 29 mmol/L (ref 22–32)
CREATININE: 0.74 mg/dL (ref 0.44–1.00)
Calcium: 9 mg/dL (ref 8.9–10.3)
GFR, Est AFR Am: >60 mL/min (ref >60)
GFR, Estimated: >60 mL/min (ref >60)
Glucose, Bld: 107 mg/dL — ABNORMAL HIGH (ref 70–99)
Potassium: 3.7 mmol/L (ref 3.5–5.1)
Sodium: 135 mmol/L (ref 135–145)
Total Protein: 7.5 g/dL (ref 6.5–8.1)

## 2018-10-07 LAB — CBC WITH DIFFERENTIAL (CANCER CENTER ONLY)
Abs Immature Granulocytes: 0.02 10*3/uL (ref 0.00–0.07)
Basophils Absolute: 0 10*3/uL (ref 0.0–0.1)
Basophils Relative: 0 %
EOS PCT: 2 %
Eosinophils Absolute: 0.1 10*3/uL (ref 0.0–0.5)
HEMATOCRIT: 31.4 % — AB (ref 36.0–46.0)
HEMOGLOBIN: 10.1 g/dL — AB (ref 12.0–15.0)
Immature Granulocytes: 0 %
LYMPHS PCT: 6 %
Lymphs Abs: 0.4 10*3/uL — ABNORMAL LOW (ref 0.7–4.0)
MCH: 28.5 pg (ref 26.0–34.0)
MCHC: 32.2 g/dL (ref 30.0–36.0)
MCV: 88.7 fL (ref 80.0–100.0)
MONO ABS: 0.5 10*3/uL (ref 0.1–1.0)
MONOS PCT: 8 %
Neutro Abs: 4.5 10*3/uL (ref 1.7–7.7)
Neutrophils Relative %: 84 %
Platelet Count: 207 10*3/uL (ref 150–400)
RBC: 3.54 MIL/uL — AB (ref 3.87–5.11)
RDW: 15.3 % (ref 11.5–15.5)
WBC Count: 5.5 10*3/uL (ref 4.0–10.5)
nRBC: 0 % (ref 0.0–0.2)

## 2018-10-07 LAB — TSH: TSH: 2.383 u[IU]/mL (ref 0.308–3.960)

## 2018-10-07 NOTE — Progress Notes (Signed)
[facility] [phone]   [phone]  OFFICE PROGRESS NOTE  Shon Baton, MD [place] [address]  DIAGNOSIS: unresectable stage IIIA/IV non-small cell lung cancer, adenocarcinoma diagnosed in July 2019 presented with right lower lobe lung mass as well as right pleural effusion and a small left lower lobe lung nodules. Molecular studies showed positive RET rearrangement and PDL 1 expression of 5%.  PRIOR THERAPY: First-line treatment with immunotherapy with Keytruda 200 mg IV every 3 weeks.  First dose 07/15/2018.  Status post 3 cycles.  This was discontinued secondary to disease progression.   CURRENT THERAPY: None.  INTERVAL HISTORY: Casey Wilkinson 77 y.o. female returns to the clinic today for follow-up visit accompanied by her husband and niece.  The patient is feeling fine today with no specific complaints except for generalized fatigue and weakness as well as shortness of breath at baseline increased with exertion.  She also had few episodes of diarrhea recently after treatment for constipation.  She denied having any weight loss or night sweats.  She has no nausea, vomiting, or constipation.  The patient has no headache or visual changes.  She was considered for treatment with Loxo 292 but unfortunately the closest center for the study is around 5-6 hours away.  The patient is here today for evaluation and discussion of other treatment options.   MEDICAL HISTORY: Past Medical History:  Diagnosis Date  . Anemia   . Arthritis    "all over"  . Carotid artery stenosis    right side followed by [facility]- 65%  . Colitis April 2016  . Colon cancer ([place])   . COPD (chronic obstructive pulmonary disease) ([place])   . Discoid lupus erythematosus   . Diverticulitis April 2016   and Colitis  . History of blood transfusion 01/2015, 06/2015   colectomy  . Hypertension   . Psoriatic arthritis ([place])   . Shortness of breath dyspnea    with  exertion  . Squamous cell carcinoma, leg     ALLERGIES:  is allergic to prochlorperazine edisylate; sulfamethoxazole; and doxycycline.  MEDICATIONS:  Current Outpatient Medications  Medication Sig Dispense Refill  . acetaminophen (TYLENOL) 500 MG tablet Take 1,000 mg by mouth daily as needed for mild pain.    Marland Kitchen albuterol (PROAIR HFA) 108 (90 Base) MCG/ACT inhaler Inhale 2 puffs into the lungs every 4 (four) hours as needed for wheezing or shortness of breath. 3 Inhaler 1  . atorvastatin (LIPITOR) 10 MG tablet Take 10 mg by mouth daily.    . Calcium Carbonate-Vitamin D 600-400 MG-UNIT per tablet Take 1 tablet by mouth at bedtime.     . Cholecalciferol (VITAMIN D3) 2000 units capsule Take 2,000 Units by mouth daily.    . Cyanocobalamin (VITAMIN B-12) 5000 MCG TBDP Take 2,500 mcg by mouth at bedtime.    . famotidine (PEPCID) 20 MG tablet Take 20 mg by mouth 2 (two) times daily.    . feeding supplement, ENSURE ENLIVE, (ENSURE ENLIVE) LIQD Take 237 mLs by mouth 3 (three) times daily between meals. (Patient not taking: Reported on 09/16/2018) 048 mL 12  . folic acid (FOLVITE) 1 MG tablet Take 1 mg by mouth every morning.     Marland Kitchen guaiFENesin (MUCINEX) 600 MG 12 hr tablet Take 600 mg by mouth 2 (two) times daily.     Marland Kitchen HYDROcodone-acetaminophen (NORCO/VICODIN) 5-325 MG tablet Take 1 tablet by mouth every 6 (six) hours as needed for moderate pain. (Patient  not taking: Reported on 09/16/2018) 30 tablet 0  . HYDROcodone-homatropine (HYCODAN) 5-1.5 MG/5ML syrup Take 5 mLs by mouth at bedtime and may repeat dose one time if needed. (Patient taking differently: Take 5 mLs by mouth at bedtime. ) 120 mL 0  . ipratropium-albuterol (DUONEB) 0.5-2.5 (3) MG/3ML SOLN Take 3 mLs by nebulization every 4 (four) hours as needed (shortness of breath/wheezing). (Patient taking differently: Take 3 mLs by nebulization every 8 (eight) hours. ) 360 mL 0  . iron polysaccharides (NIFEREX) 150 MG capsule Take 150 mg by mouth  daily.    Marland Kitchen lidocaine (XYLOCAINE) 2 % solution caregiver: Mix 1part 2% viscous lidocaine, 1part H20. Swallow 68m of diluted mixture, 358m before meals and at bedtime, up to QID (Patient taking differently: Use as directed 10 mLs in the mouth or throat 4 (four) times daily as needed for mouth pain. Mix 1part 2% viscous lidocaine, 1part H20. Swallow 1077mf diluted mixture, 13m76mefore meals and at bedtime, up to QID) 150 mL 2  . loratadine (CLARITIN) 10 MG tablet Take 10 mg by mouth daily.    . LOMarland Kitchenazepam (ATIVAN) 0.5 MG tablet Take 0.5 mg by mouth 2 (two) times daily as needed for anxiety.    . metoprolol tartrate (LOPRESSOR) 25 MG tablet Take 1 tablet (25 mg total) by mouth 2 (two) times daily. (Patient not taking: Reported on 08/13/2018) 60 tablet 0  . mirtazapine (REMERON) 7.5 MG tablet Take 1 tablet (7.5 mg total) by mouth at bedtime. 10 tablet 0  . Multiple Vitamin (MULTIVITAMIN WITH MINERALS) TABS tablet Take 1 tablet by mouth daily.    . ondansetron (ZOFRAN) 4 MG tablet Take 4 mg by mouth every 6 (six) hours as needed for nausea or vomiting.    . polyethylene glycol (MIRALAX / GLYCOLAX) packet Take 17 g by mouth daily as needed for mild constipation.     . Potassium 99 MG TABS Take 99 mg by mouth daily.    . sennosides-docusate sodium (SENOKOT-S) 8.6-50 MG tablet Take 1 tablet by mouth at bedtime.     . traMADol (ULTRAM) 50 MG tablet Take 1 tablet (50 mg total) by mouth every 6 (six) hours as needed for moderate pain. (Patient not taking: Reported on 09/16/2018) 14 tablet 0  . TRELEGY ELLIPTA 100-62.5-25 MCG/INH AEPB USE 1 INHALATION DAILY (Patient taking differently: Inhale 1 puff into the lungs daily. ) 180 each 1  . valsartan-hydrochlorothiazide (DIOVAN-HCT) 160-12.5 MG tablet Take 1 tablet by mouth daily.     No current facility-administered medications for this visit.     SURGICAL HISTORY:  Past Surgical History:  Procedure Laterality Date  . APPENDECTOMY  06/2015  . CATARACT  EXTRACTION W/ INTRAOCULAR LENS  IMPLANT, BILATERAL Bilateral 2017  . COLONOSCOPY  06/23/15  . COLONOSCOPY W/ POLYPECTOMY    . CYSTOCELE REPAIR  2006  . DILATION AND CURETTAGE OF UTERUS  X 2  . EXCISIONAL HEMORRHOIDECTOMY  2006  . JOINT REPLACEMENT    . LAPAROSCOPIC PARTIAL COLECTOMY N/A 06/23/2015   Procedure: LAPAROSCOPIC ILEOCECETOMY;  Surgeon: BenjExcell Seltzer;  Location: [facility];  Service: General;  Laterality: N/A;  . MOHS SURGERY Left    "basal; LLE"  . RECTOCELE REPAIR  2006  . SQUAMOUS CELL CARCINOMA EXCISION  "several"   "legs mostly"  . TOTAL KNEE ARTHROPLASTY Left 01/31/2016   Procedure: LEFT TOTAL KNEE ARTHROPLASTY;  Surgeon: NaipLeandrew Koyanagi;  [facility];  Laterality: Left;  . VAMarland KitchenINAL HYSTERECTOMY  1968   "  total"  . VIDEO BRONCHOSCOPY Bilateral 05/06/2018   Procedure: VIDEO BRONCHOSCOPY WITH FLUORO;  Surgeon: Collene Gobble, MD;  Location[facility];  Service: Cardiopulmonary;  Laterality: Bilateral;    REVIEW OF SYSTEMS:  Constitutional: positive for fatigue Eyes: negative Ears, nose, mouth, throat, and face: negative Respiratory: positive for cough and dyspnea on exertion Cardiovascular: negative Gastrointestinal: positive for diarrhea Genitourinary:negative Integument/breast: negative Hematologic/lymphatic: negative Musculoskeletal:negative Neurological: negative Behavioral/Psych: negative Endocrine: negative Allergic/Immunologic: negative   PHYSICAL EXAMINATION: General appearance: alert, cooperative, fatigued and no distress Head: Normocephalic, without obvious abnormality, atraumatic Neck: no adenopathy, no JVD, supple, symmetrical, trachea midline and thyroid not enlarged, symmetric, no tenderness/mass/nodules Lymph nodes: Cervical, supraclavicular, and axillary nodes normal. Resp: wheezes bilaterally Back: symmetric, no curvature. ROM normal. No CVA tenderness. Cardio: regular rate and rhythm, S1, S2 normal, no murmur,  click, rub or gallop GI: soft, non-tender; bowel sounds normal; no masses,  no organomegaly Extremities: extremities normal, atraumatic, no cyanosis or edema Neurologic: Alert and oriented X 3, normal strength and tone. Normal symmetric reflexes. Normal coordination and gait  ECOG PERFORMANCE STATUS: 1 - Symptomatic but completely ambulatory  Blood pressure (!) 94/53, pulse (!) 115, temperature (!) 97.4 F (36.3 C), temperature source Oral, resp. rate 17, height 5' 5"  (1.651 m), weight 144 lb 9.6 oz (65.6 kg), SpO2 90 %.  LABORATORY DATA: Lab Results  Component Value Date   WBC 5.5 10/07/2018   HGB 10.1 (L) 10/07/2018   HCT 31.4 (L) 10/07/2018   MCV 88.7 10/07/2018   PLT 207 10/07/2018      Chemistry      Component Value Date/Time   NA 133 (L) 09/16/2018 0836   K 4.0 09/16/2018 0836   CL 99 09/16/2018 0836   CO2 23 09/16/2018 0836   BUN 11 09/16/2018 0836   CREATININE 0.69 09/16/2018 0836      Component Value Date/Time   CALCIUM 9.0 09/16/2018 0836   ALKPHOS 163 (H) 09/16/2018 0836   AST 24 09/16/2018 0836   ALT 9 09/16/2018 0836   BILITOT 0.5 09/16/2018 0836       RADIOGRAPHIC STUDIES: Ct Chest W Contrast  Result Date: 09/14/2018 CLINICAL DATA:  Right lung cancer restaging, completed radiation therapy and ongoing chemotherapy. History of colon cancer treated surgically. EXAM: CT CHEST, ABDOMEN, AND PELVIS WITH CONTRAST TECHNIQUE: Multidetector CT imaging of the chest, abdomen and pelvis was performed following the standard protocol during bolus administration of intravenous contrast. CONTRAST:  167m OMNIPAQUE IOHEXOL 300 MG/ML  SOLN COMPARISON:  Multiple exams, including 08/13/2018 and PET-CT of 07/03/2018 FINDINGS: CT CHEST FINDINGS Cardiovascular: Coronary, aortic arch, and branch vessel atherosclerotic vascular disease. Small pericardial effusion increased from prior exam. Mediastinum/Nodes: Right hilar node 1.2 cm in short axis on image 31/2, previously 0.9 cm. Right  lower subcarinal node 1.1 cm in short axis on image 35/2, formerly 0.7 cm on 06/14/2018. Small AP window lymph nodes are present. Mild wall thickening of the distal esophagus. Lungs/Pleura: New small to moderate right pleural effusion with some loculation. Severe centrilobular emphysema. Extensive consolidation in the right lower lobe with architectural distortion, air bronchograms, and masslike densities as shown on prior exams including 08/13/2018. There is progressive volume loss and consolidation in the right middle lobe, increased from 08/13/2018. A right upper lobe nodule measuring 1.1 by 0.8 cm on image 74/4 previously measured approximately the same on 07/03/2018. Chronic atelectasis medially in the left lower lobe. Musculoskeletal: Unremarkable CT ABDOMEN PELVIS FINDINGS Hepatobiliary: 2.4 by 2.3 cm hypodense lesion posteriorly in  segment 4 of the liver on image 56/2 demonstrating increasing conspicuity over the last several months. There is no appreciable hyperintensity in this region on the prior PET-CT but also the lesion was not very conspicuous on the CT data at that time. Mild prominence of size of the lateral segment left hepatic lobe. Gallbladder unremarkable. Pancreas: Unremarkable Spleen: There is a band of low-density along the periphery of the spleen indicating site of prior infarct. Adrenals/Urinary Tract: Small adrenal nodule 10 mm in thickness on image 58/2, stable back through 2016 hence likely benign. Mild scarring of the right kidney lower pole. Stomach/Bowel: Clinical concern was raised for a anorectal mass on the prior PET-CT, there is density in this vicinity for example on image 112/2 and a mass is not readily excluded. Vascular/Lymphatic: Aortoiliac atherosclerotic vascular disease. No pathologic adenopathy observed in the abdomen/pelvis. Reproductive: Uterus absent.  Adnexa unremarkable. Other: No supplemental non-categorized findings. Musculoskeletal: Degenerative subcortical cyst  formation in the left acetabulum. Degenerative endplate disease and degenerative disc disease at L5-S1 with mild bilateral foraminal impingement. Small umbilical hernia contains adipose tissue. A small left groin hernia contains adipose tissue. IMPRESSION: 1. Progressive hypodensity posteriorly in segment 4 of the liver measuring 2.4 by 2.3 cm. The appearance is suspicious for metastatic malignancy, focal fatty infiltration is a less likely differential diagnostic consideration given the location and appearance. 2. New small to moderate right pleural effusion with some loculation. 3. Accounting for the resulting passive atelectasis, there is stable consolidation and masslike architectural distortion in the right lower lobe. 4. Increased consolidation and atelectasis in the right middle lobe compared to 08/13/2018. Stable right upper lobe nodule. 5. Anorectal soft tissue prominence at a site where there was previously high metabolic activity on PET-CT, correlate with anorectal exam in assessing for malignancy in this area. 6. Small pericardial effusion, increased from prior. 7. Other imaging findings of potential clinical significance: Aortic Atherosclerosis (ICD10-I70.0) and Emphysema (ICD10-J43.9). Coronary atherosclerosis. Wall thickening in the distal esophagus potentially from esophagitis. Evolving splenic infarct. Chronically stable 10 mm left adrenal nodule, likely benign. Mild foraminal impingement at L5-S1. Small umbilical and left groin hernias containing adipose tissue. Electronically Signed   By: Van Clines M.D.   On: 09/14/2018 14:51   Ct Abdomen Pelvis W Contrast  Result Date: 09/14/2018 CLINICAL DATA:  Right lung cancer restaging, completed radiation therapy and ongoing chemotherapy. History of colon cancer treated surgically. EXAM: CT CHEST, ABDOMEN, AND PELVIS WITH CONTRAST TECHNIQUE: Multidetector CT imaging of the chest, abdomen and pelvis was performed following the standard protocol  during bolus administration of intravenous contrast. CONTRAST:  192m OMNIPAQUE IOHEXOL 300 MG/ML  SOLN COMPARISON:  Multiple exams, including 08/13/2018 and PET-CT of 07/03/2018 FINDINGS: CT CHEST FINDINGS Cardiovascular: Coronary, aortic arch, and branch vessel atherosclerotic vascular disease. Small pericardial effusion increased from prior exam. Mediastinum/Nodes: Right hilar node 1.2 cm in short axis on image 31/2, previously 0.9 cm. Right lower subcarinal node 1.1 cm in short axis on image 35/2, formerly 0.7 cm on 06/14/2018. Small AP window lymph nodes are present. Mild wall thickening of the distal esophagus. Lungs/Pleura: New small to moderate right pleural effusion with some loculation. Severe centrilobular emphysema. Extensive consolidation in the right lower lobe with architectural distortion, air bronchograms, and masslike densities as shown on prior exams including 08/13/2018. There is progressive volume loss and consolidation in the right middle lobe, increased from 08/13/2018. A right upper lobe nodule measuring 1.1 by 0.8 cm on image 74/4 previously measured approximately the same on 07/03/2018.  Chronic atelectasis medially in the left lower lobe. Musculoskeletal: Unremarkable CT ABDOMEN PELVIS FINDINGS Hepatobiliary: 2.4 by 2.3 cm hypodense lesion posteriorly in segment 4 of the liver on image 56/2 demonstrating increasing conspicuity over the last several months. There is no appreciable hyperintensity in this region on the prior PET-CT but also the lesion was not very conspicuous on the CT data at that time. Mild prominence of size of the lateral segment left hepatic lobe. Gallbladder unremarkable. Pancreas: Unremarkable Spleen: There is a band of low-density along the periphery of the spleen indicating site of prior infarct. Adrenals/Urinary Tract: Small adrenal nodule 10 mm in thickness on image 58/2, stable back through 2016 hence likely benign. Mild scarring of the right kidney lower pole.  Stomach/Bowel: Clinical concern was raised for a anorectal mass on the prior PET-CT, there is density in this vicinity for example on image 112/2 and a mass is not readily excluded. Vascular/Lymphatic: Aortoiliac atherosclerotic vascular disease. No pathologic adenopathy observed in the abdomen/pelvis. Reproductive: Uterus absent.  Adnexa unremarkable. Other: No supplemental non-categorized findings. Musculoskeletal: Degenerative subcortical cyst formation in the left acetabulum. Degenerative endplate disease and degenerative disc disease at L5-S1 with mild bilateral foraminal impingement. Small umbilical hernia contains adipose tissue. A small left groin hernia contains adipose tissue. IMPRESSION: 1. Progressive hypodensity posteriorly in segment 4 of the liver measuring 2.4 by 2.3 cm. The appearance is suspicious for metastatic malignancy, focal fatty infiltration is a less likely differential diagnostic consideration given the location and appearance. 2. New small to moderate right pleural effusion with some loculation. 3. Accounting for the resulting passive atelectasis, there is stable consolidation and masslike architectural distortion in the right lower lobe. 4. Increased consolidation and atelectasis in the right middle lobe compared to 08/13/2018. Stable right upper lobe nodule. 5. Anorectal soft tissue prominence at a site where there was previously high metabolic activity on PET-CT, correlate with anorectal exam in assessing for malignancy in this area. 6. Small pericardial effusion, increased from prior. 7. Other imaging findings of potential clinical significance: Aortic Atherosclerosis (ICD10-I70.0) and Emphysema (ICD10-J43.9). Coronary atherosclerosis. Wall thickening in the distal esophagus potentially from esophagitis. Evolving splenic infarct. Chronically stable 10 mm left adrenal nodule, likely benign. Mild foraminal impingement at L5-S1. Small umbilical and left groin hernias containing adipose  tissue. Electronically Signed   By: Van Clines M.D.   On: 09/14/2018 14:51    ASSESSMENT AND PLAN: This is a very pleasant 77 years old white female with stage IV non-small cell lung cancer, adenocarcinoma with positive RET mutation and PDL 1 expression of 5%. The patient was started on treatment with immunotherapy with single agent Ketruda (pembrolizumab) status post 3 cycles. She tolerated this treatment well. The patient had evidence for disease progression. She was considered for treatment with LOXO 292, an oral RET tyrosine kinase inhibitor but unfortunately the closest center for clinical trial is around 5 6 hours away and the patient does not have the ability to travel. I discussed with the patient other treatment options including consideration of systemic chemotherapy with reduced dose carboplatin and Alimta.  I discussed with the patient the adverse effect of this treatment including but not limited to mild alopecia, myelosuppression, nausea and vomiting, peripheral neuropathy, liver or renal dysfunction. The patient and her family would like to take some time to think about this option before considering any further treatment.  They were also given the option of palliative care and hospice referral. If she agrees to the treatment with carboplatin and Alimta, this  will be a bridge until the approval of the LOXO 292. The patient will call with her final decision before we arrange for any treatment or referral to hospice. She was advised to call immediately if she has any other concerning symptoms in the interval. The patient voices understanding of current disease status and treatment options and is in agreement with the current care plan.  All questions were answered. The patient knows to call the clinic with any problems, questions or concerns. We can certainly see the patient much sooner if necessary.  Disclaimer: This note was dictated with voice recognition software. Similar  sounding words can inadvertently be transcribed and may not be corrected upon review.

## 2018-10-15 DIAGNOSIS — R110 Nausea: Secondary | ICD-10-CM | POA: Diagnosis not present

## 2018-10-15 DIAGNOSIS — C3490 Malignant neoplasm of unspecified part of unspecified bronchus or lung: Secondary | ICD-10-CM | POA: Diagnosis not present

## 2018-10-15 DIAGNOSIS — R5383 Other fatigue: Secondary | ICD-10-CM | POA: Diagnosis not present

## 2018-10-15 DIAGNOSIS — Z515 Encounter for palliative care: Secondary | ICD-10-CM | POA: Diagnosis not present

## 2018-10-15 DIAGNOSIS — J449 Chronic obstructive pulmonary disease, unspecified: Secondary | ICD-10-CM | POA: Diagnosis not present

## 2018-10-16 DIAGNOSIS — K649 Unspecified hemorrhoids: Secondary | ICD-10-CM | POA: Diagnosis not present

## 2018-10-16 DIAGNOSIS — I10 Essential (primary) hypertension: Secondary | ICD-10-CM | POA: Diagnosis not present

## 2018-10-16 DIAGNOSIS — C3431 Malignant neoplasm of lower lobe, right bronchus or lung: Secondary | ICD-10-CM | POA: Diagnosis not present

## 2018-10-16 DIAGNOSIS — C7970 Secondary malignant neoplasm of unspecified adrenal gland: Secondary | ICD-10-CM | POA: Diagnosis not present

## 2018-10-16 DIAGNOSIS — M3210 Systemic lupus erythematosus, organ or system involvement unspecified: Secondary | ICD-10-CM | POA: Diagnosis not present

## 2018-10-16 DIAGNOSIS — D649 Anemia, unspecified: Secondary | ICD-10-CM | POA: Diagnosis not present

## 2018-10-16 DIAGNOSIS — L4050 Arthropathic psoriasis, unspecified: Secondary | ICD-10-CM | POA: Diagnosis not present

## 2018-10-16 DIAGNOSIS — R630 Anorexia: Secondary | ICD-10-CM | POA: Diagnosis not present

## 2018-10-16 DIAGNOSIS — J910 Malignant pleural effusion: Secondary | ICD-10-CM | POA: Diagnosis not present

## 2018-10-16 DIAGNOSIS — J449 Chronic obstructive pulmonary disease, unspecified: Secondary | ICD-10-CM | POA: Diagnosis not present

## 2018-10-17 DIAGNOSIS — C3431 Malignant neoplasm of lower lobe, right bronchus or lung: Secondary | ICD-10-CM | POA: Diagnosis not present

## 2018-10-17 DIAGNOSIS — C7970 Secondary malignant neoplasm of unspecified adrenal gland: Secondary | ICD-10-CM | POA: Diagnosis not present

## 2018-10-17 DIAGNOSIS — J449 Chronic obstructive pulmonary disease, unspecified: Secondary | ICD-10-CM | POA: Diagnosis not present

## 2018-10-17 DIAGNOSIS — D649 Anemia, unspecified: Secondary | ICD-10-CM | POA: Diagnosis not present

## 2018-10-17 DIAGNOSIS — R630 Anorexia: Secondary | ICD-10-CM | POA: Diagnosis not present

## 2018-10-17 DIAGNOSIS — J910 Malignant pleural effusion: Secondary | ICD-10-CM | POA: Diagnosis not present

## 2018-10-18 DIAGNOSIS — D649 Anemia, unspecified: Secondary | ICD-10-CM | POA: Diagnosis not present

## 2018-10-18 DIAGNOSIS — C3431 Malignant neoplasm of lower lobe, right bronchus or lung: Secondary | ICD-10-CM | POA: Diagnosis not present

## 2018-10-18 DIAGNOSIS — R630 Anorexia: Secondary | ICD-10-CM | POA: Diagnosis not present

## 2018-10-18 DIAGNOSIS — J449 Chronic obstructive pulmonary disease, unspecified: Secondary | ICD-10-CM | POA: Diagnosis not present

## 2018-10-18 DIAGNOSIS — J910 Malignant pleural effusion: Secondary | ICD-10-CM | POA: Diagnosis not present

## 2018-10-18 DIAGNOSIS — C7970 Secondary malignant neoplasm of unspecified adrenal gland: Secondary | ICD-10-CM | POA: Diagnosis not present

## 2018-10-19 DIAGNOSIS — J449 Chronic obstructive pulmonary disease, unspecified: Secondary | ICD-10-CM | POA: Diagnosis not present

## 2018-10-19 DIAGNOSIS — D649 Anemia, unspecified: Secondary | ICD-10-CM | POA: Diagnosis not present

## 2018-10-19 DIAGNOSIS — R630 Anorexia: Secondary | ICD-10-CM | POA: Diagnosis not present

## 2018-10-19 DIAGNOSIS — C3431 Malignant neoplasm of lower lobe, right bronchus or lung: Secondary | ICD-10-CM | POA: Diagnosis not present

## 2018-10-19 DIAGNOSIS — J910 Malignant pleural effusion: Secondary | ICD-10-CM | POA: Diagnosis not present

## 2018-10-19 DIAGNOSIS — C7970 Secondary malignant neoplasm of unspecified adrenal gland: Secondary | ICD-10-CM | POA: Diagnosis not present

## 2018-10-20 DIAGNOSIS — C7970 Secondary malignant neoplasm of unspecified adrenal gland: Secondary | ICD-10-CM | POA: Diagnosis not present

## 2018-10-20 DIAGNOSIS — D649 Anemia, unspecified: Secondary | ICD-10-CM | POA: Diagnosis not present

## 2018-10-20 DIAGNOSIS — C3431 Malignant neoplasm of lower lobe, right bronchus or lung: Secondary | ICD-10-CM | POA: Diagnosis not present

## 2018-10-20 DIAGNOSIS — J910 Malignant pleural effusion: Secondary | ICD-10-CM | POA: Diagnosis not present

## 2018-10-20 DIAGNOSIS — J449 Chronic obstructive pulmonary disease, unspecified: Secondary | ICD-10-CM | POA: Diagnosis not present

## 2018-10-20 DIAGNOSIS — R630 Anorexia: Secondary | ICD-10-CM | POA: Diagnosis not present

## 2018-10-21 DIAGNOSIS — M3210 Systemic lupus erythematosus, organ or system involvement unspecified: Secondary | ICD-10-CM | POA: Diagnosis not present

## 2018-10-21 DIAGNOSIS — I10 Essential (primary) hypertension: Secondary | ICD-10-CM | POA: Diagnosis not present

## 2018-10-21 DIAGNOSIS — R630 Anorexia: Secondary | ICD-10-CM | POA: Diagnosis not present

## 2018-10-21 DIAGNOSIS — D649 Anemia, unspecified: Secondary | ICD-10-CM | POA: Diagnosis not present

## 2018-10-21 DIAGNOSIS — J910 Malignant pleural effusion: Secondary | ICD-10-CM | POA: Diagnosis not present

## 2018-10-21 DIAGNOSIS — K649 Unspecified hemorrhoids: Secondary | ICD-10-CM | POA: Diagnosis not present

## 2018-10-21 DIAGNOSIS — C7970 Secondary malignant neoplasm of unspecified adrenal gland: Secondary | ICD-10-CM | POA: Diagnosis not present

## 2018-10-21 DIAGNOSIS — J449 Chronic obstructive pulmonary disease, unspecified: Secondary | ICD-10-CM | POA: Diagnosis not present

## 2018-10-21 DIAGNOSIS — L4050 Arthropathic psoriasis, unspecified: Secondary | ICD-10-CM | POA: Diagnosis not present

## 2018-10-21 DIAGNOSIS — C3431 Malignant neoplasm of lower lobe, right bronchus or lung: Secondary | ICD-10-CM | POA: Diagnosis not present

## 2018-11-10 ENCOUNTER — Ambulatory Visit: Payer: Medicare Other | Admitting: Emergency Medicine

## 2018-11-21 DEATH — deceased

## 2019-01-24 IMAGING — DX DG CHEST 1V
1 series · 1 of 1 positions shown · non-contrast
Comparison: Single-view of the chest 05/15/2018. CT chest
05/12/2018.

CLINICAL DATA: Status post right thoracentesis today.

EXAM:
CHEST  1 VIEW

[chest pa]
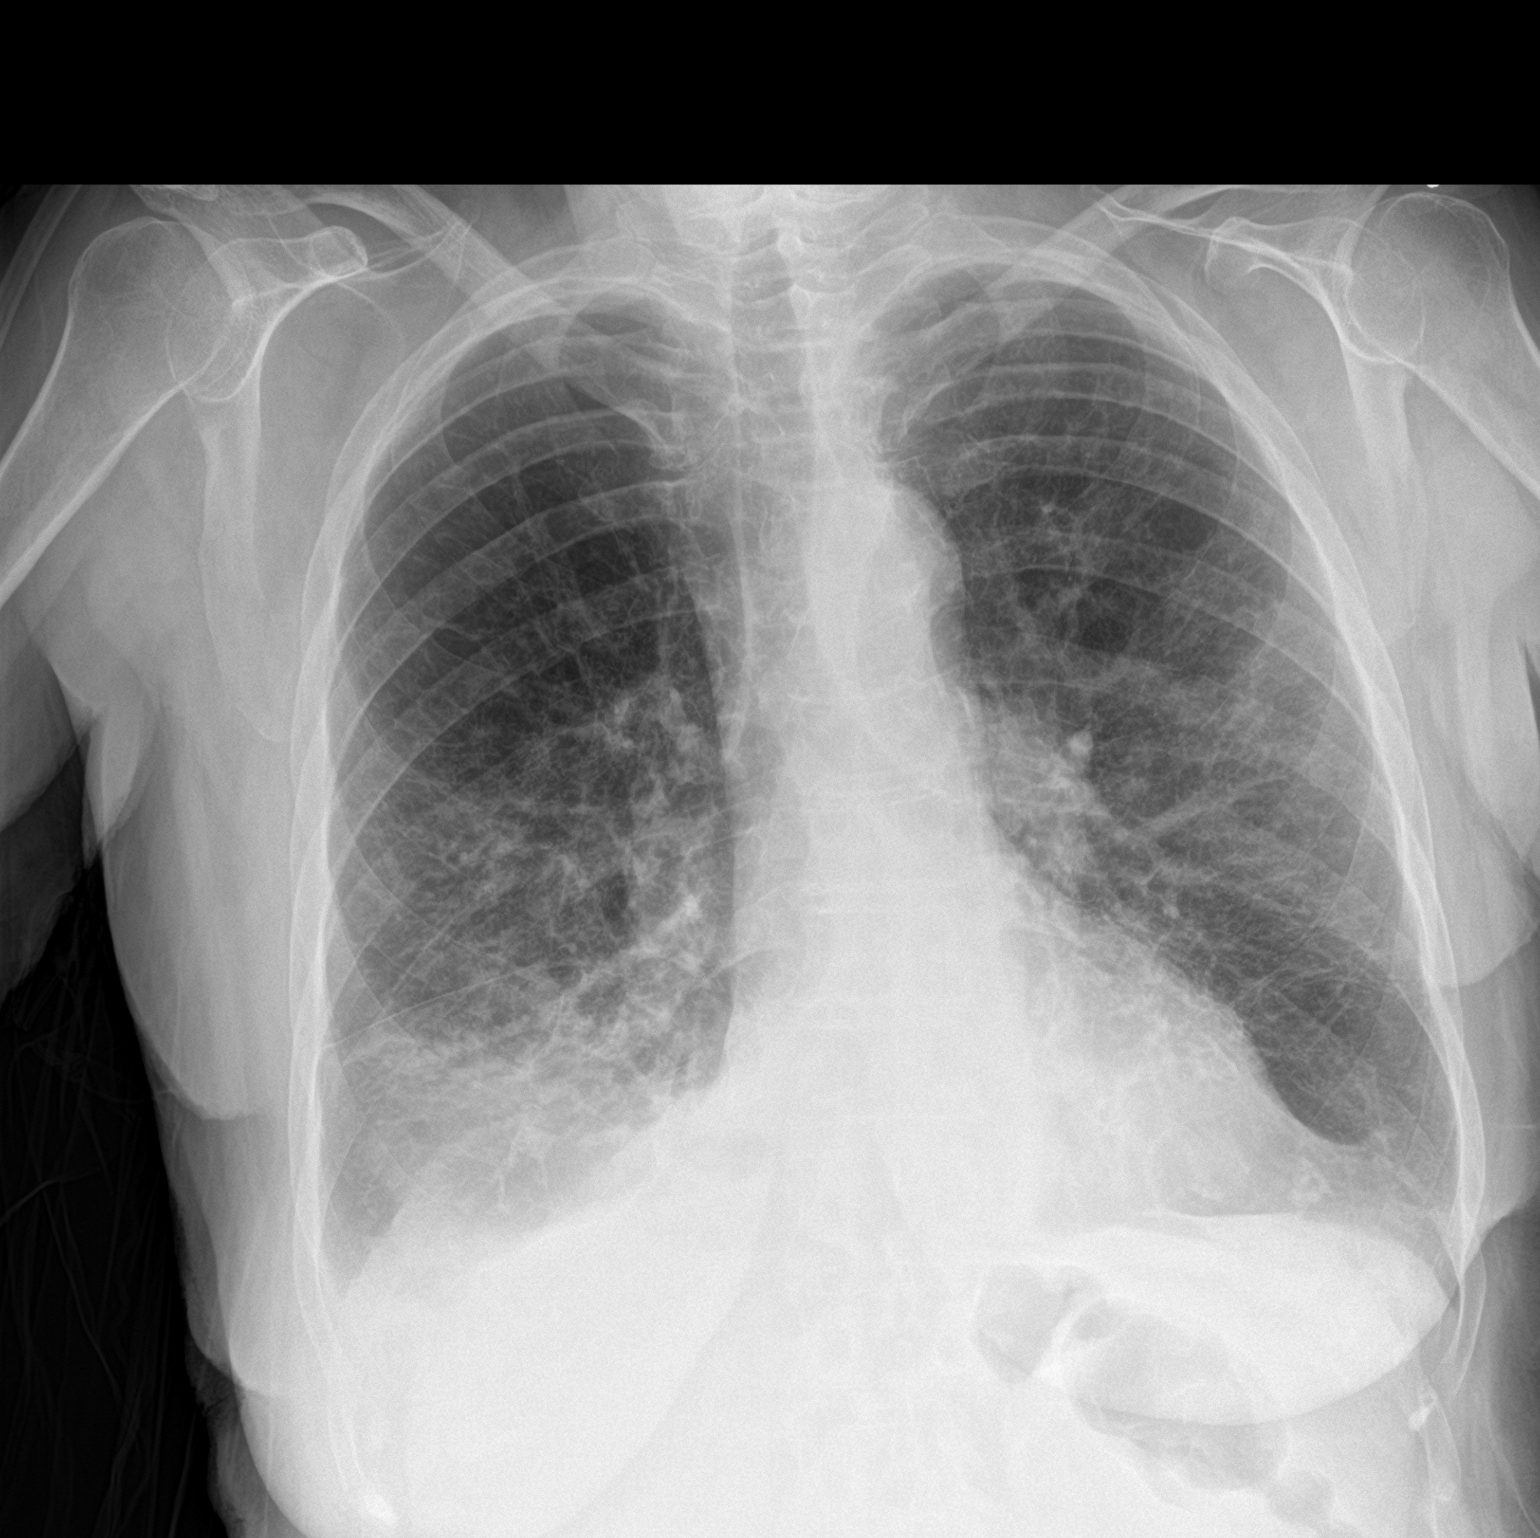

[1 of 1 positions shown; findings below may reference images not displayed]

FINDINGS: Right pleural effusion appears decreased compared to the prior plain
film. No pneumothorax is identified. There is no left effusion.
Right basilar airspace disease is again seen. The lungs are
emphysematous. Heart size is normal. Aortic atherosclerosis is
noted.
IMPRESSION: Decreased right pleural effusion after thoracentesis. Negative for
pneumothorax.

Right basilar airspace disease most compatible with atelectasis.

Emphysema.

Atherosclerosis.

## 2019-01-24 IMAGING — US US THORACENTESIS ASP PLEURAL SPACE W/IMG GUIDE
1 series · 7 of 7 positions shown · non-contrast
Comparison: none

INDICATION: Patient with prior history of colon cancer and now with newly
diagnosed lung cancer, dyspnea, right pleural effusion. Request made
for diagnostic and therapeutic right thoracentesis.

[Series 1: us thoracentesis asp pleural space w/img guide · 7 of 7 slices shown]
[im 1/7]
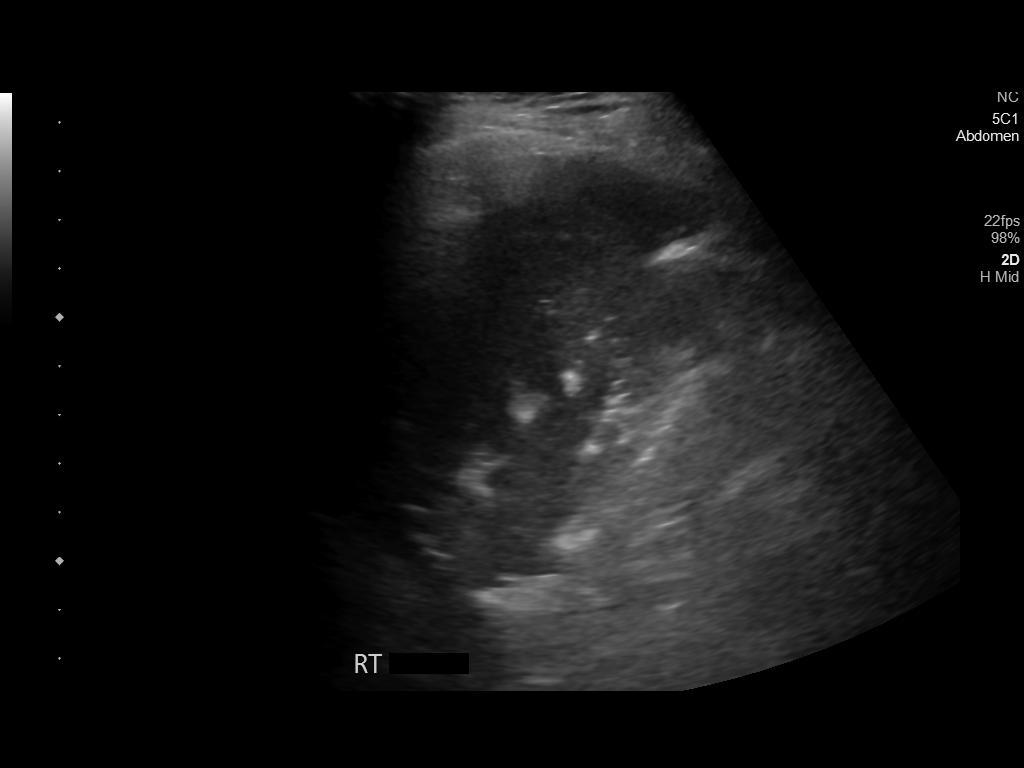
[im 2/7]
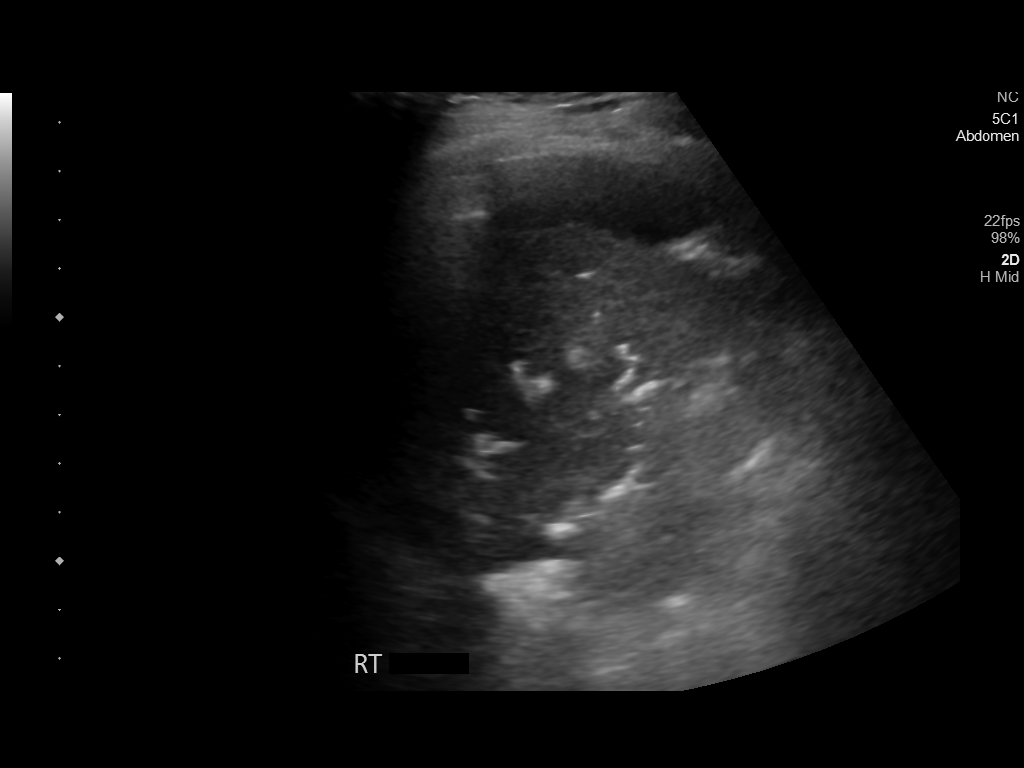
[im 3/7]
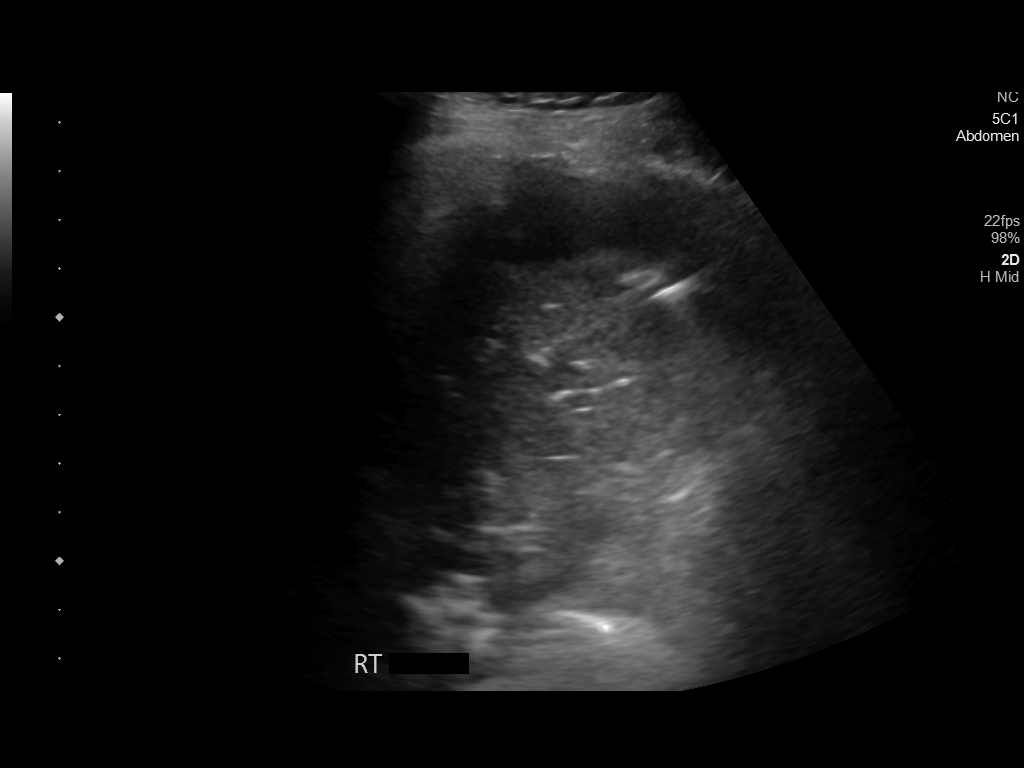
[im 4/7]
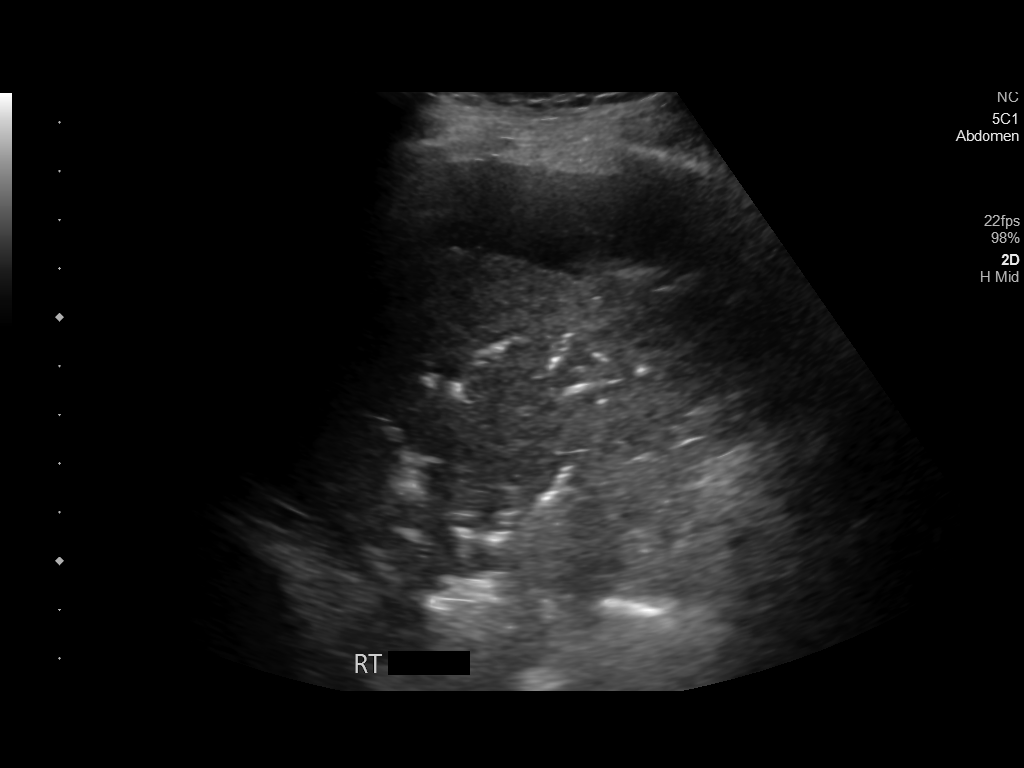
[im 5/7]
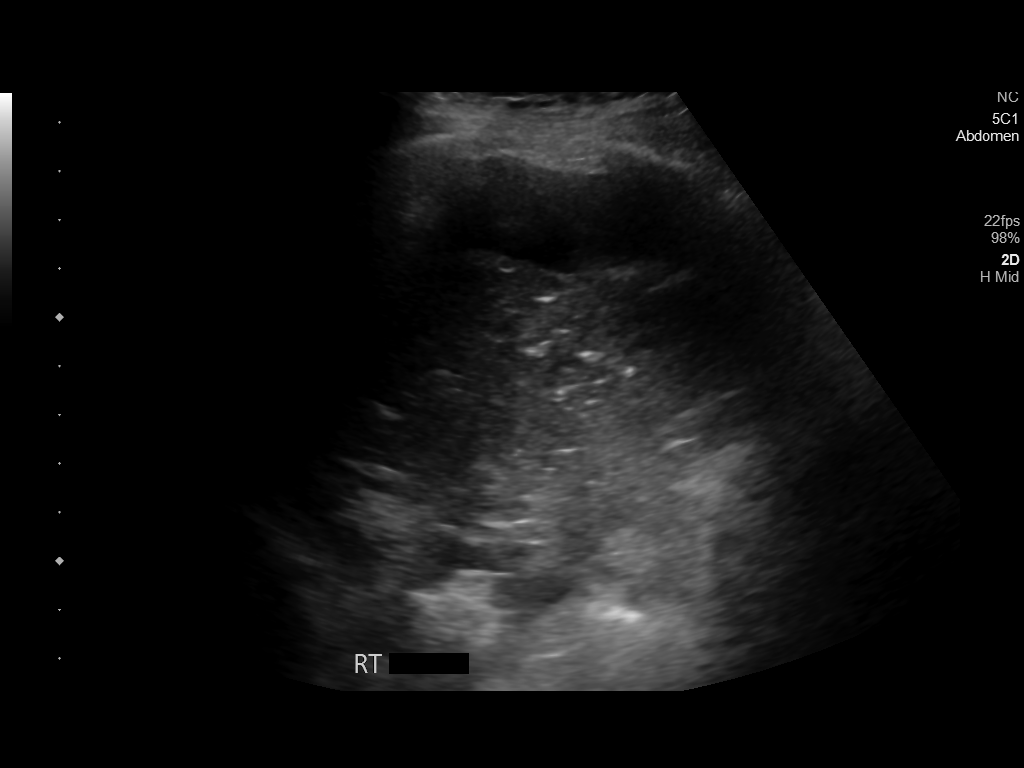
[im 6/7]
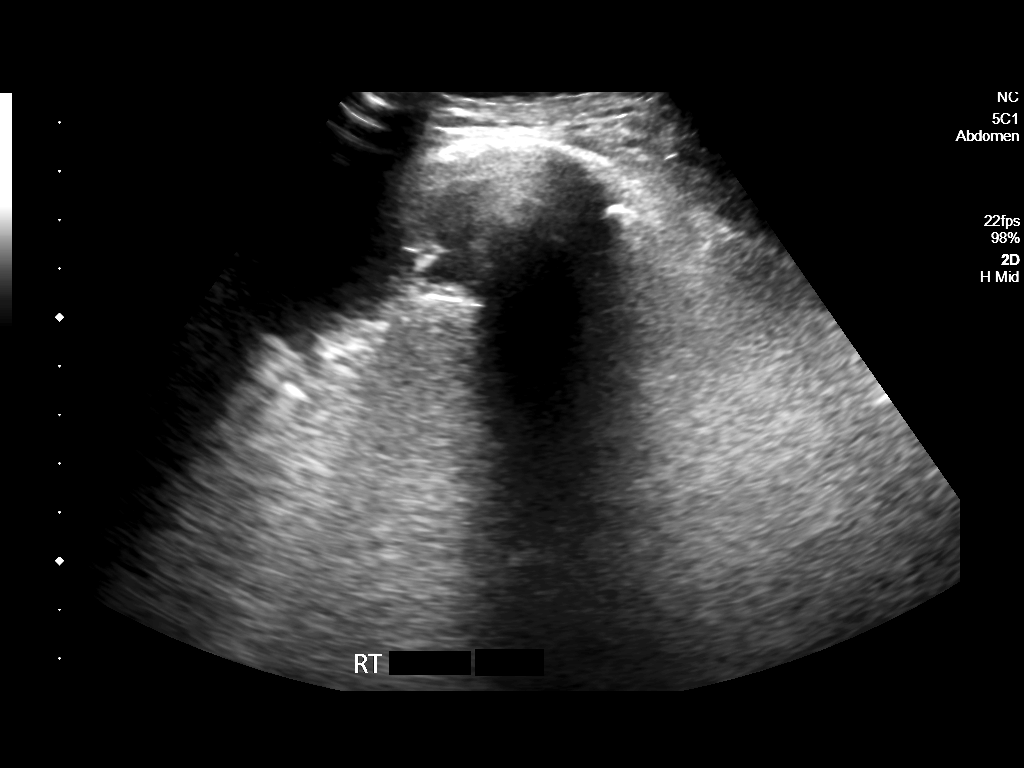
[im 7/7]
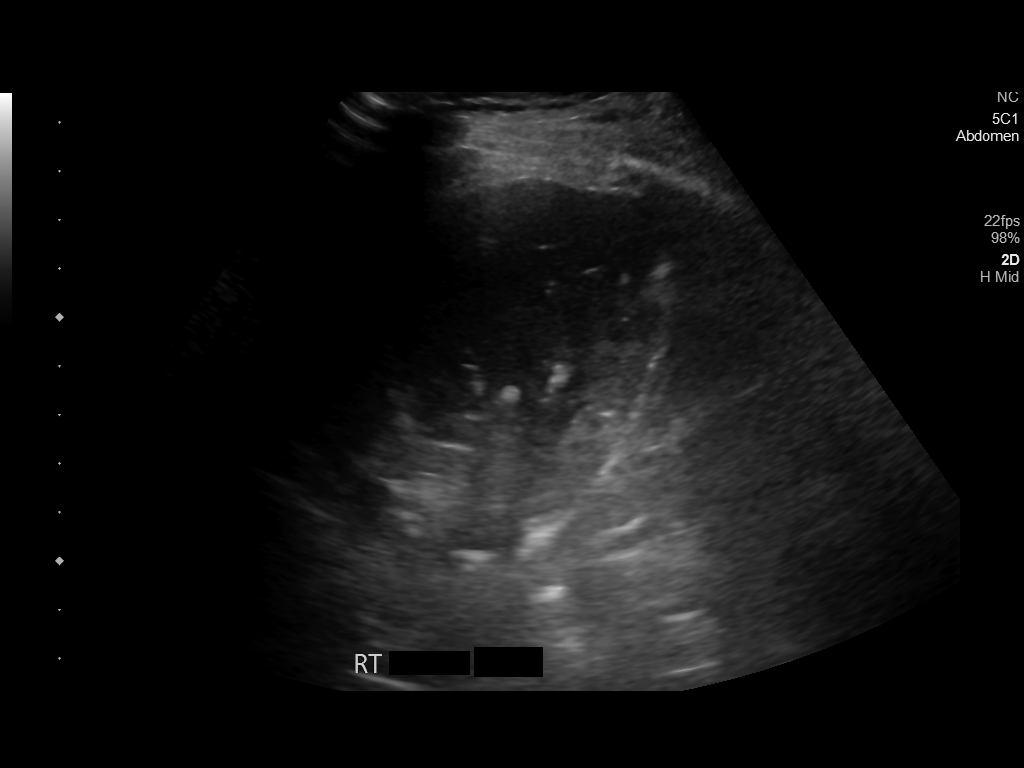

[7 of 7 positions shown; findings below may reference images not displayed]

EXAM:
ULTRASOUND GUIDED DIAGNOSTIC AND THERAPEUTIC RIGHT THORACENTESIS

MEDICATIONS:
None

COMPLICATIONS:
None immediate.

PROCEDURE:
An ultrasound guided thoracentesis was thoroughly discussed with the
patient and questions answered. The benefits, risks, alternatives
and complications were also discussed. The patient understands and
wishes to proceed with the procedure. Written consent was obtained.

Ultrasound was performed to localize and mark an adequate pocket of
fluid in the right chest. The area was then prepped and draped in
the normal sterile fashion. 1% Lidocaine was used for local
anesthesia. Under ultrasound guidance a 6 Fr Safe-T-Centesis
catheter was introduced. Thoracentesis was performed. The catheter
was removed and a dressing applied.
FINDINGS: A total of approximately 50 cc of yellow fluid was removed. Samples
were sent to the laboratory as requested by the clinical team. The
right pleural effusion was very small. No significant left pleural
effusion noted.
IMPRESSION: Successful ultrasound guided diagnostic and therapeutic right
thoracentesis yielding 50 cc of pleural fluid.

## 2019-01-28 IMAGING — CT CT CHEST W/ CM
2 of 3 series · 14 of 36 positions shown, 17 images · IV contrast (omnipaque)
Comparison: 05/12/2017

CLINICAL DATA: Productive cough. Hemoptysis and wheezing.
Progressive right lower lobe opacity on chest radiography.

EXAM:
CT CHEST WITH CONTRAST
TECHNIQUE: Multidetector CT imaging of the chest was performed during
intravenous contrast administration.
CONTRAST:  75mL OMNIPAQUE IOHEXOL 300 MG/ML  SOLN

[Series 2: axial st · axial · 0.87mm/px · z∈[+1168,+1462]mm · 11 of 173 slices shown, 14 images]
[im 13/173  mediastinal]
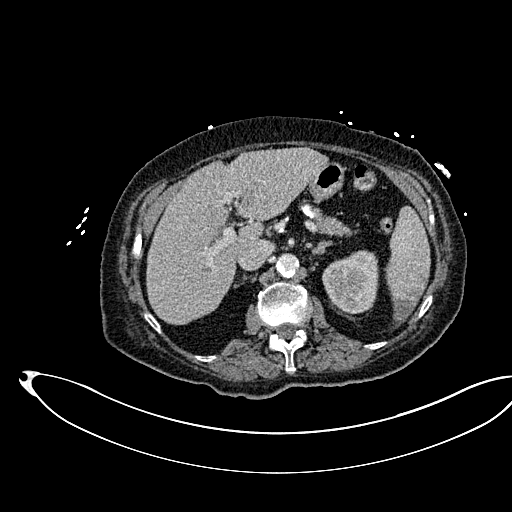
[im 13/173  lung]
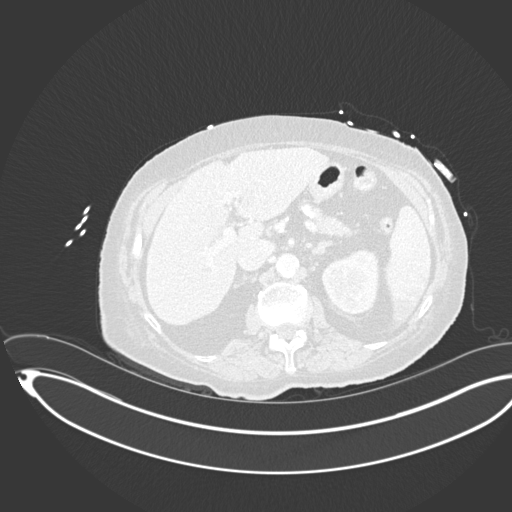
[im 26/173  lung]
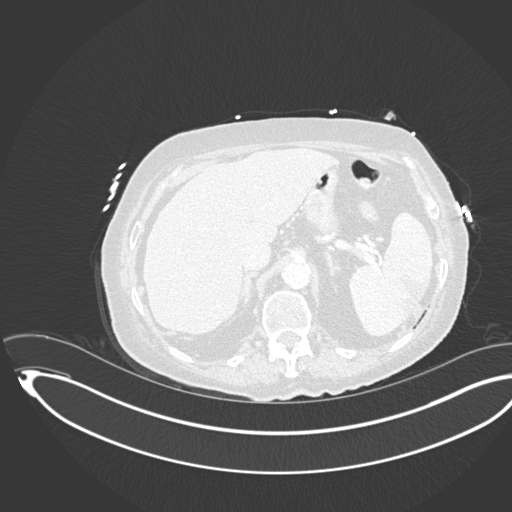
[im 39/173  lung]
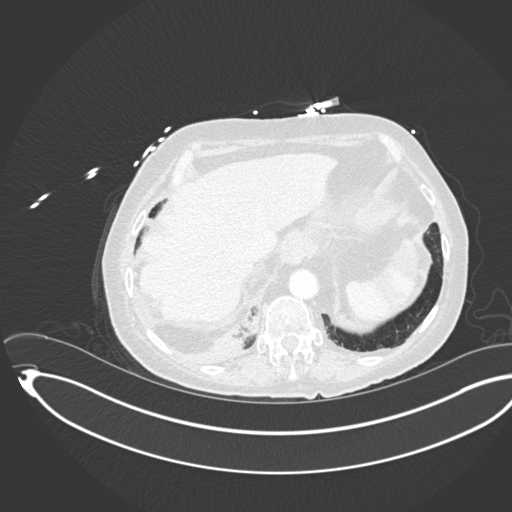
[im 58/173  lung]
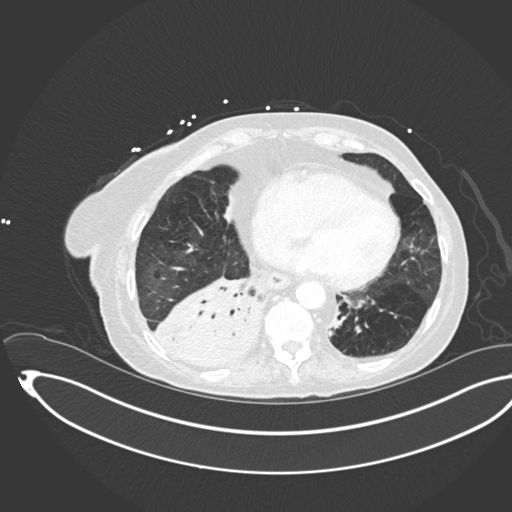
[im 71/173  mediastinal]
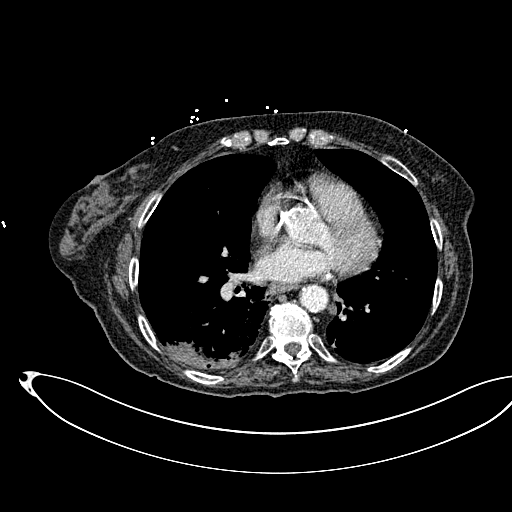
[im 71/173  lung]
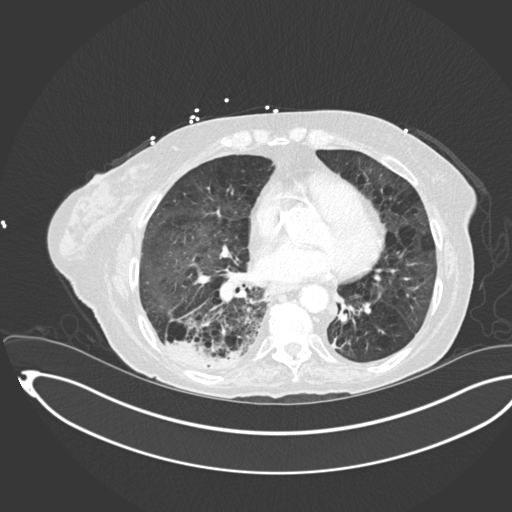
[im 90/173  lung]
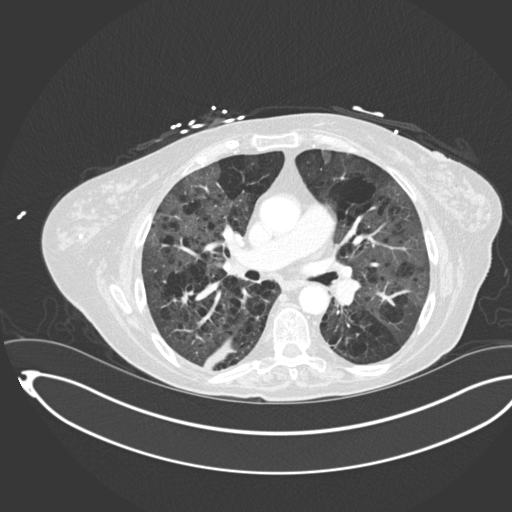
[im 102/173  lung]
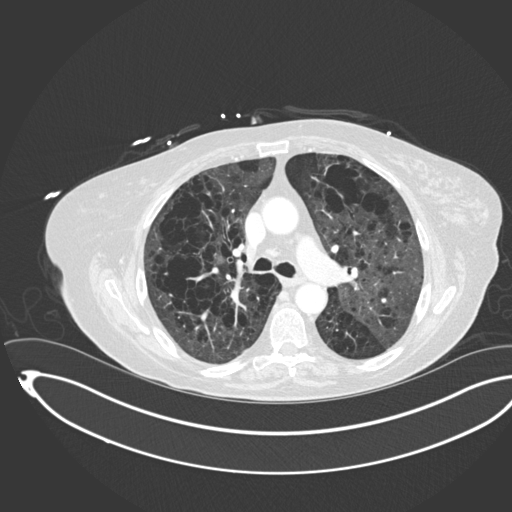
[im 115/173  lung]
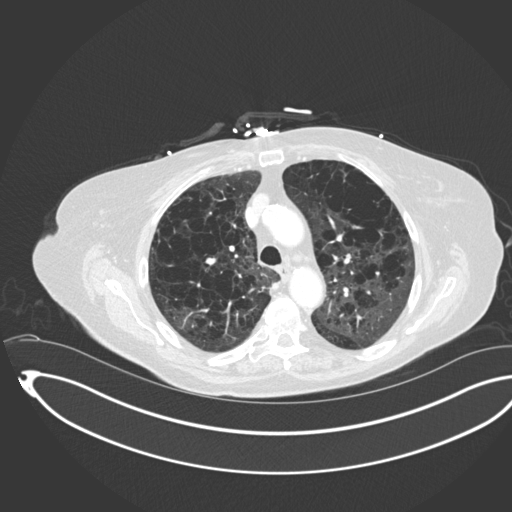
[im 134/173  mediastinal]
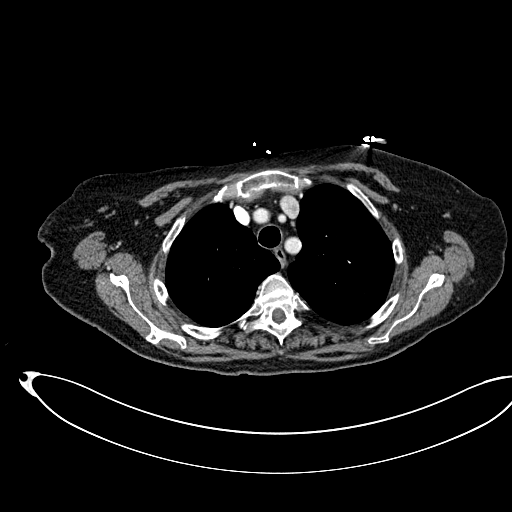
[im 134/173  lung]
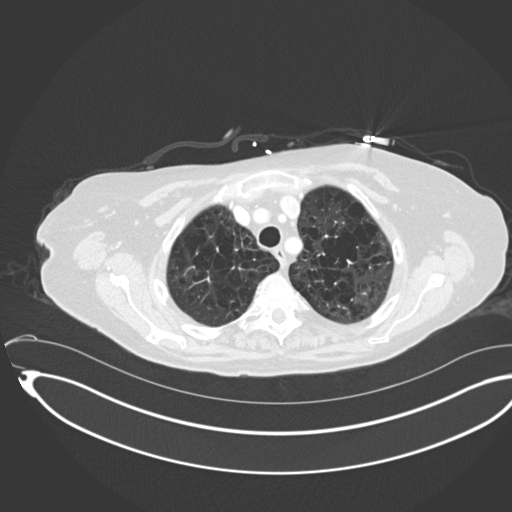
[im 147/173  lung]
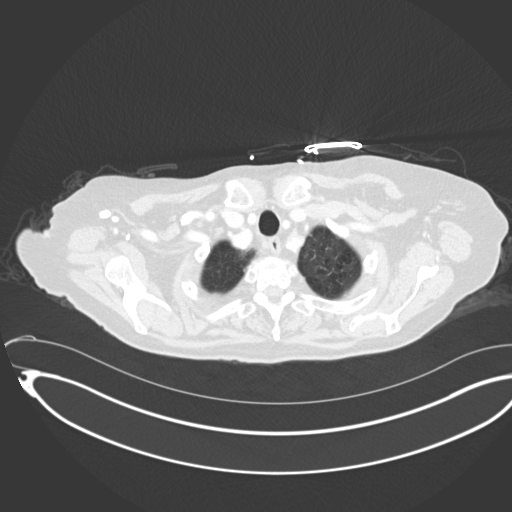
[im 160/173  lung]
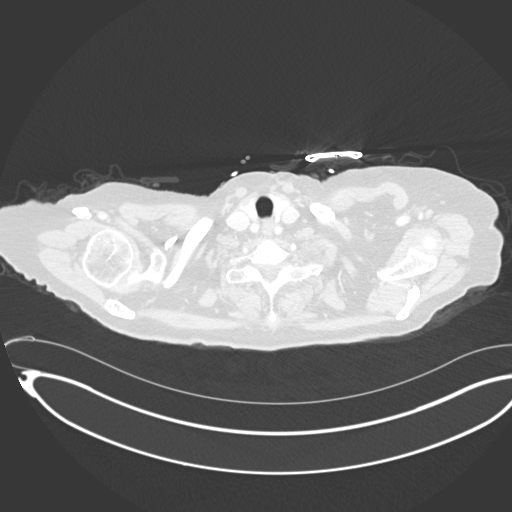

[Series 5: coronal · coronal · 0.70mm/px · 3 of 133 slices shown]
[im 27/133  lung]
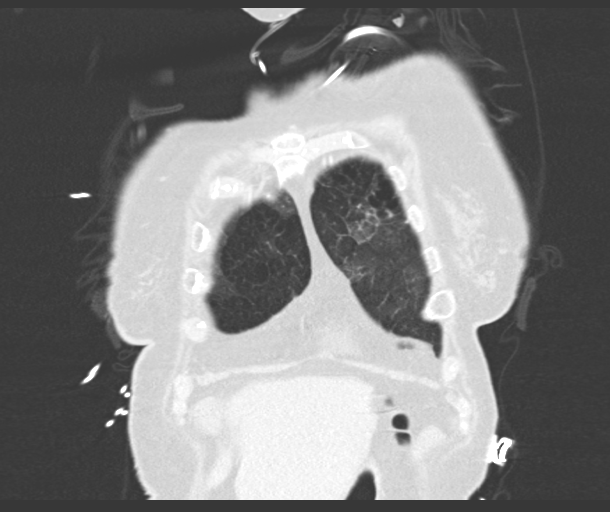
[im 53/133  lung]
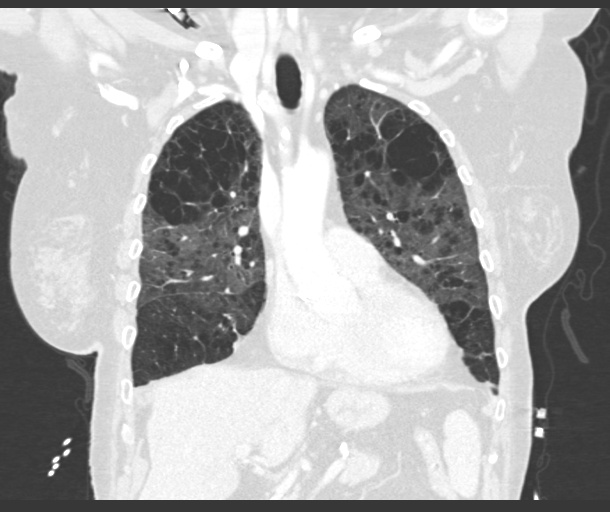
[im 80/133  lung]
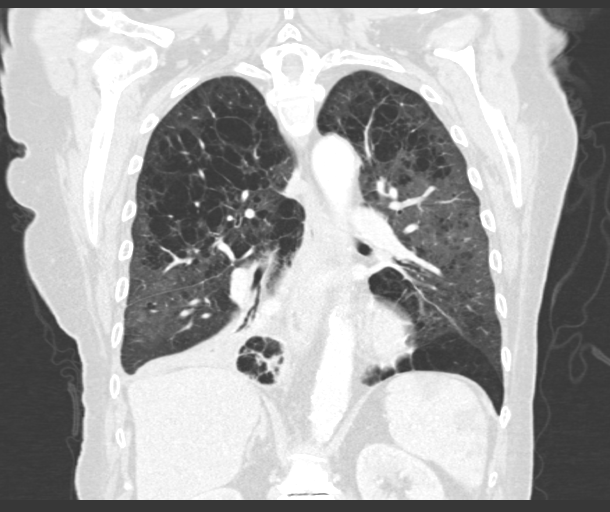

[14 of 36 positions shown; findings below may reference images not displayed]

FINDINGS: Cardiovascular: Coronary, aortic arch, and branch vessel
atherosclerotic vascular disease.

Mediastinum/Nodes: Scattered small mediastinal lymph nodes including
an 8 mm paratracheal node on image 67/2 which is stable, and a 9 mm
right hilar lymph node. Currently no overtly pathologic adenopathy.

Lungs/Pleura: There is consolidation of much of the right lung with
some sparing of the superior segment. Air bronchograms within the
consolidated portion. No central obstructing bronchial lesion.
However, within the consolidated portion, there several hypodense
areas including a 2.0 by 2.0 peripheral hypodensity on image 117/2
and a 2.9 by 1.4 cm posterior hypodensity on image 119/2. Neither of
these 2 hypodense areas was readily apparent on 05/12/2018. Several
other vague hypodense areas are noted within the right lower lobe
consolidation.

Severe centrilobular emphysema. 0.8 by 0.5 cm left upper lobe nodule
on image 101/7, stable. 0.7 by 0.5 cm left lower lobe nodule on
image 106/7, stable. There is atelectasis medially in the left lower
lobe.

Upper Abdomen: Hypodense triangular regions in the spleen are
probably from splenic infarcts particularly in light of the faint
perisplenic edema, and less likely to be due to early vascular
phase. Advanced atherosclerosis of the splenic artery noted.
Advanced abdominal aortic atherosclerosis noted with extensive
plaque formation intruding on the lumen just below the level of the
renal arteries. Stable nodularity of the left adrenal gland.

Musculoskeletal: Thoracic spondylosis.
IMPRESSION: 1. Continued dense consolidation in the right lower lobe with
sparing of the superior segment. There are several new hypodense
regions within the consolidated right lower lobe which could be from
abscess, pulmonary infarct, or tumor. The consolidation could be
from radiation pneumonitis, pneumonia, or less likely
bronchopulmonary spread of tumor. There is no central truncation of
the tracheobronchial tree. Borderline adenopathy in the chest.
2. Suspected new splenic infarcts. Origin uncertain but there is
advanced atherosclerosis of the splenic artery and abdominal aorta.
3. Stable nodularity of the left adrenal gland.
4.  Aortic Atherosclerosis ([id number].S).  Coronary atherosclerosis.
5. Emphysema ([id number].2).
6. There are 2 stable left lung pulmonary nodules which warrants
surveillance.

## 2019-02-02 IMAGING — DX DG CHEST 2V
2 series · 2 of 2 positions shown · non-contrast
Comparison: 06/17/2018, 06/14/2018

CLINICAL DATA: 77-year-old female with a history of fever

EXAM:
CHEST - 2 VIEW

[chest lat]
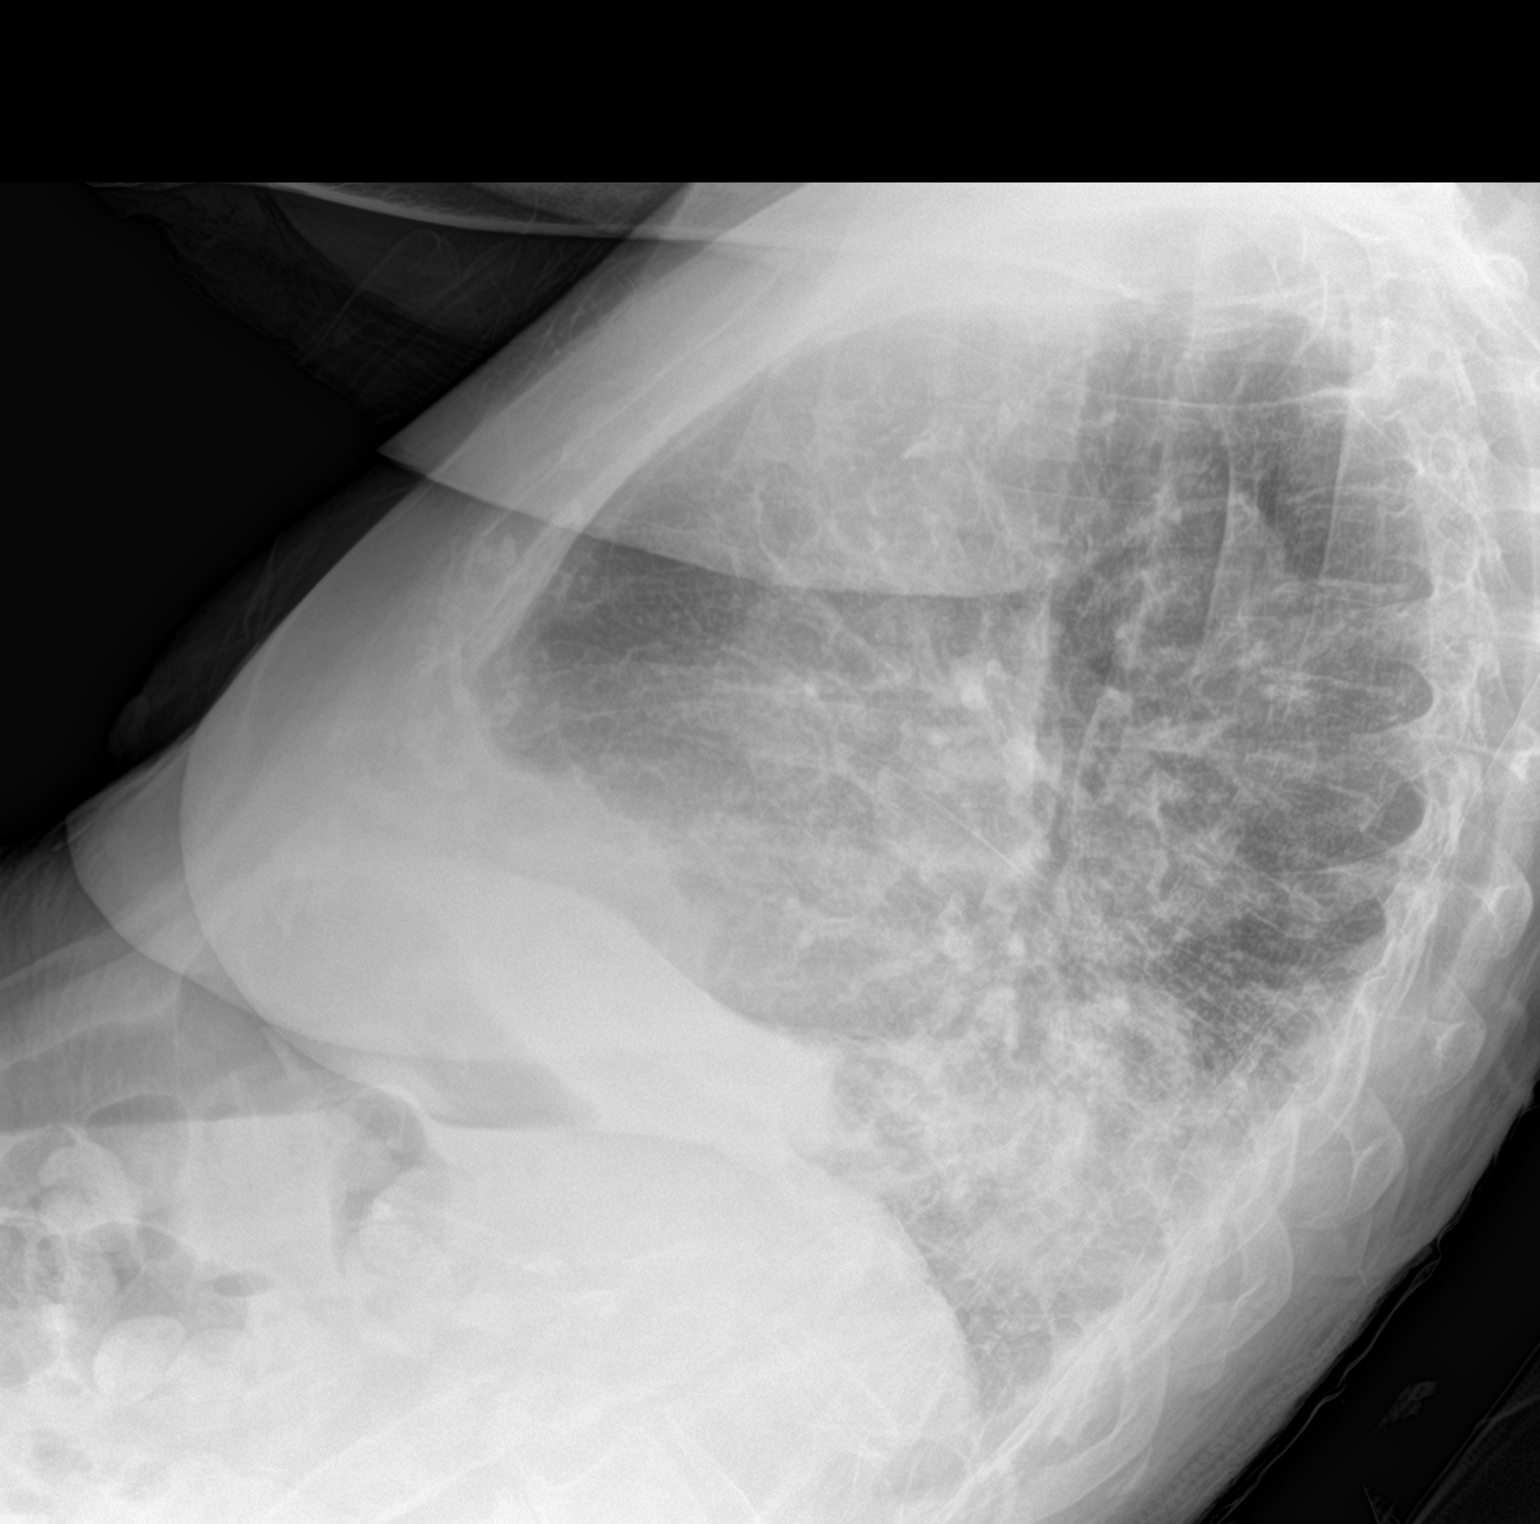

[chest ap]
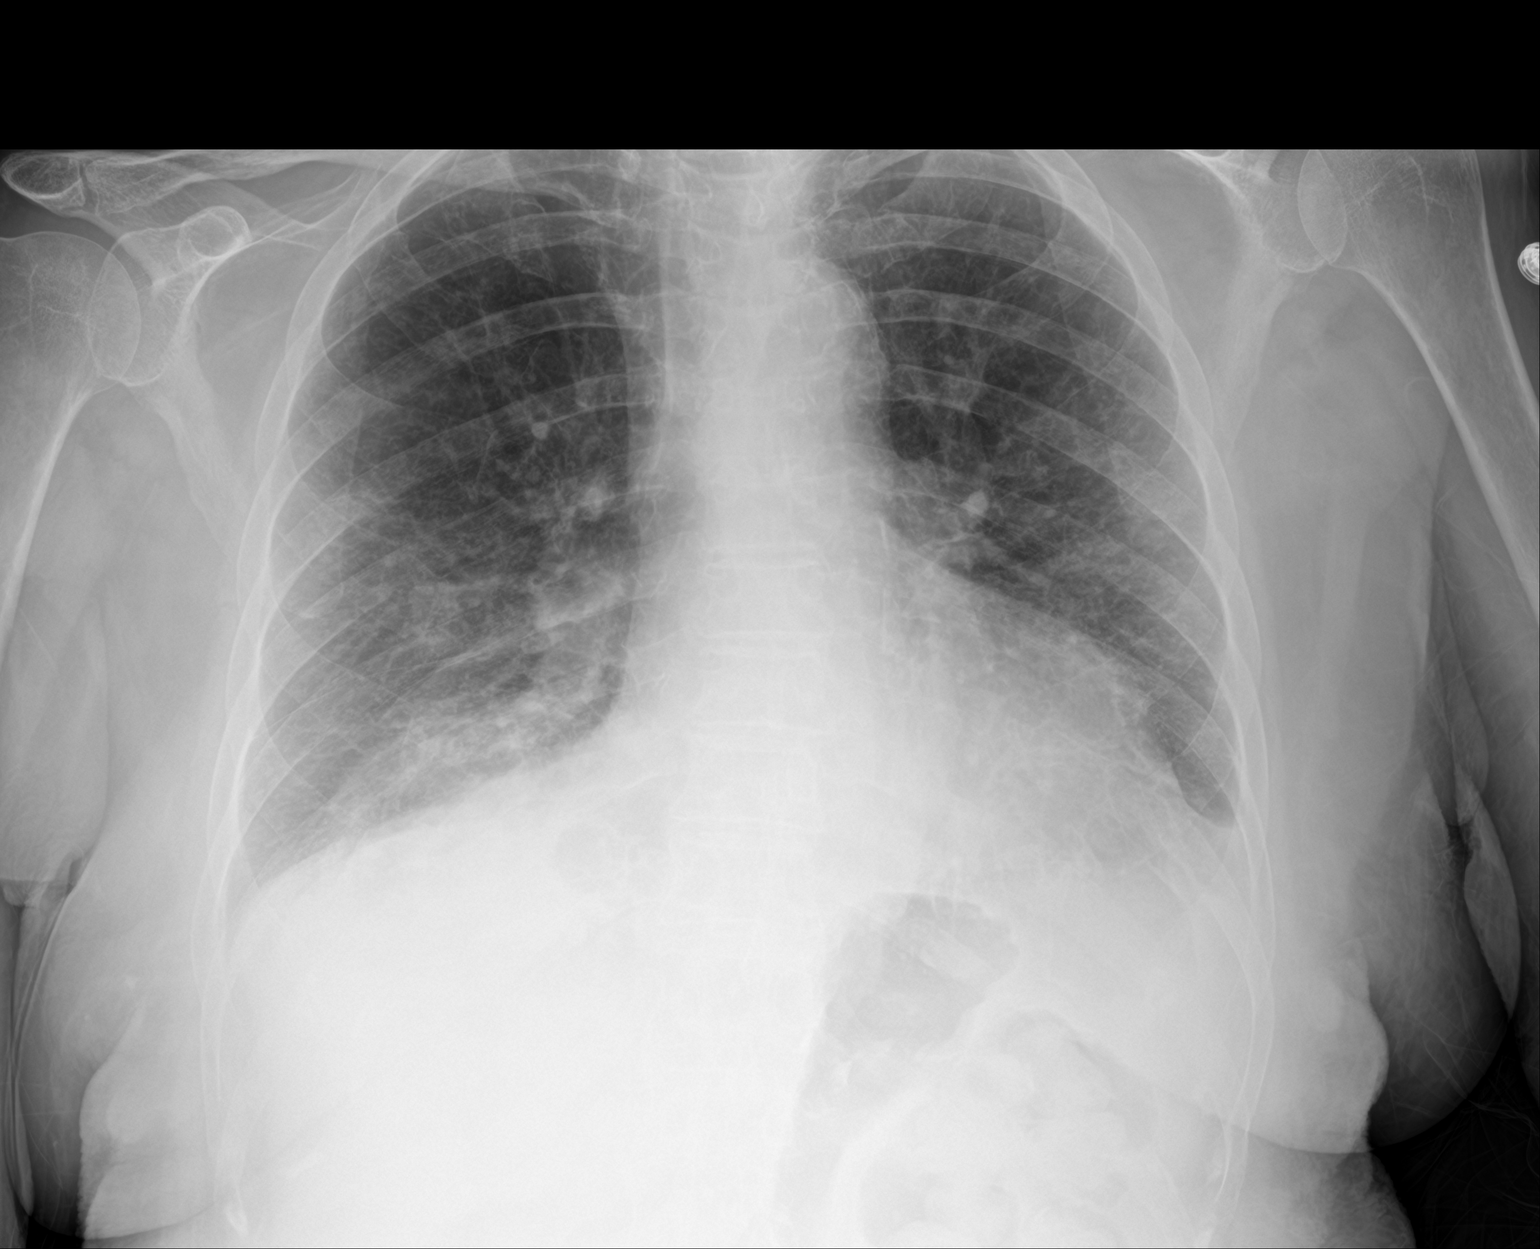

[2 of 2 positions shown; findings below may reference images not displayed]

FINDINGS: Cardiomediastinal silhouette unchanged in size and contour.
Improving airspace opacity at the right lung base, with blunting of
the bilateral costophrenic angles persisting. Coarsened interstitial
markings bilaterally. Meniscus on the lateral view
IMPRESSION: Improving airspace opacities, with persisting small pleural
effusions.

## 2019-06-28 ENCOUNTER — Encounter: Payer: Self-pay | Admitting: Internal Medicine
